# Patient Record
Sex: Female | Born: 1975 | Race: White | Hispanic: No
Health system: Midwestern US, Community
[De-identification: ages and names within clinical notes are randomized; demographics above are authoritative.]

## PROBLEM LIST (undated history)

## (undated) DIAGNOSIS — K029 Dental caries, unspecified: Secondary | ICD-10-CM

## (undated) DIAGNOSIS — B009 Herpesviral infection, unspecified: Secondary | ICD-10-CM

## (undated) DIAGNOSIS — E119 Type 2 diabetes mellitus without complications: Secondary | ICD-10-CM

## (undated) DIAGNOSIS — E781 Pure hyperglyceridemia: Secondary | ICD-10-CM

## (undated) DIAGNOSIS — E7800 Pure hypercholesterolemia, unspecified: Secondary | ICD-10-CM

## (undated) DIAGNOSIS — F39 Unspecified mood [affective] disorder: Secondary | ICD-10-CM

## (undated) HISTORY — PX: APPENDECTOMY: SHX54

## (undated) HISTORY — PX: CHOLECYSTECTOMY: SHX55

## (undated) HISTORY — PX: TONSILLECTOMY: SUR1361

## (undated) HISTORY — PX: SHOULDER SURGERY: SHX246

---

## 2011-04-07 LAB — COMPREHENSIVE METABOLIC PANEL
ALT: 36 U/L (ref 4–40)
AST: 23 U/L (ref 8–36)
Albumin/Globulin Ratio: 1.8 (ref 1.0–2.7)
Albumin: 4.5 g/dL (ref 3.4–4.8)
Alkaline Phosphatase: 81 U/L (ref 25–100)
Anion Gap: 16 mmol/L (ref 8–16)
BUN: 21 mg/dL — ABNORMAL HIGH (ref 6–20)
CO2: 29 mmol/L (ref 20–31)
Calcium: 9.4 mg/dL (ref 8.6–10.4)
Chloride: 99 mmol/L (ref 98–110)
Creatinine: 0.97 mg/dL (ref 0.4–1.0)
GFR African American: >60 mL/min (ref >60)
GFR Non-African American: >60 mL/min (ref >60)
Glucose: 231 mg/dL — ABNORMAL HIGH (ref 74–106)
Potassium: 4.6 mmol/L (ref 3.5–5.1)
Protein, Total: 7 g/dL (ref 6.4–8.3)
Sodium: 140 mmol/L (ref 136–145)
Total Bilirubin: 0.48 mg/dL (ref 0.3–1.2)

## 2011-04-07 LAB — CBC WITH AUTO DIFFERENTIAL
Absolute Eos #: 0.1 10*3/uL (ref 0.0–0.4)
Absolute Lymph #: 2 10*3/uL (ref 1.0–4.8)
Absolute Mono #: 0.5 10*3/uL (ref 0.1–1.2)
Absolute Neut #: 4.6 10*3/uL (ref 1.8–7.7)
Basophils Absolute: 0.1 10*3/uL (ref 0.0–0.2)
Basophils: 1 % (ref 0–2)
Eosinophils %: 2 % (ref 1–4)
Hematocrit: 38 % (ref 36–46)
Hemoglobin: 12.7 g/dL (ref 12.0–16.0)
Lymphocytes: 28 % (ref 24–44)
MCH: 31.4 pg (ref 26–34)
MCHC: 33.4 g/dL (ref 31–37)
MCV: 94 fL (ref 80–100)
MPV: 8.5 fL (ref 6.0–12.0)
Monocytes: 7 % (ref 2–11)
Platelets: 313 10*3/uL (ref 140–450)
RBC: 4.04 m/uL (ref 4.0–5.2)
RDW: 13.7 % (ref 12.5–15.4)
Seg Neutrophils: 62 % (ref 36–66)
WBC: 7.2 10*3/uL (ref 3.5–11.0)

## 2011-04-07 LAB — HEMOGLOBIN A1C
Estimated Avg Glucose: 180 mg/dL
Hemoglobin A1C: 7.9 % — ABNORMAL HIGH (ref 4.0–6.0)

## 2011-04-07 LAB — LIPID PANEL
Chol/HDL Ratio: 4.7 (ref <5.0)
Cholesterol: 179 mg/dL (ref <200)
HDL: 38 mg/dL — ABNORMAL LOW (ref >40)
LDL Cholesterol: 77 mg/dL (ref <100)
Triglycerides: 321 mg/dL — ABNORMAL HIGH (ref <150)

## 2011-04-07 LAB — TSH: TSH: 3.32 mIU/L (ref 0.35–5.50)

## 2011-07-19 LAB — URINE CULTURE CLEAN CATCH

## 2012-06-19 ENCOUNTER — Inpatient Hospital Stay: Admit: 2012-06-19 | Discharge: 2012-06-19 | Disposition: A | Discharge status: Home / Self Care

## 2012-06-19 LAB — CBC WITH AUTO DIFFERENTIAL
Absolute Eos #: 0.12 10*3/uL (ref 0.0–0.4)
Absolute Lymph #: 2.53 10*3/uL (ref 1.0–4.8)
Absolute Mono #: 0.66 10*3/uL (ref 0.2–0.8)
Basophils Absolute: 0.05 10*3/uL (ref 0.0–0.2)
Basophils: 1 % (ref 0–2)
Eosinophils %: 1 % (ref 1–4)
Hematocrit: 40.7 % (ref 36–46)
Hemoglobin: 13.5 g/dL (ref 12.0–16.0)
Lymphocytes: 30 % (ref 24–44)
MCH: 30.8 pg (ref 26–34)
MCHC: 33.2 g/dL (ref 31–37)
MCV: 92.9 fL (ref 80–100)
MPV: 7.7 fL (ref 6.0–12.0)
Monocytes: 8 % — ABNORMAL HIGH (ref 1–7)
Platelets: 397 10*3/uL (ref 130–400)
RBC: 4.38 m/uL (ref 4.0–5.2)
RDW: 13 % (ref 11.5–14.5)
Seg Neutrophils: 60 % (ref 36–66)
Segs Absolute: 5.04 10*3/uL (ref 1.8–7.7)
WBC: 8.4 10*3/uL (ref 3.5–11.0)

## 2012-06-19 LAB — BASIC METABOLIC PANEL
Anion Gap: 14 mmol/L (ref 8–16)
BUN: 22 mg/dL — ABNORMAL HIGH (ref 5–20)
Bun/Cre Ratio: 31 — ABNORMAL HIGH (ref 9–20)
CO2: 23 mmol/L (ref 20–31)
Calcium: 9.5 mg/dL (ref 8.6–10.4)
Chloride: 101 mmol/L (ref 98–110)
Creatinine: 0.71 mg/dL (ref 0.4–1.0)
GFR African American: >60 mL/min (ref >60)
GFR Non-African American: >60 mL/min (ref >60)
Glucose: 288 mg/dL — ABNORMAL HIGH (ref 74–106)
Potassium: 4.1 mmol/L (ref 3.5–5.1)
Sodium: 134 mmol/L — ABNORMAL LOW (ref 136–145)

## 2012-06-19 LAB — URINALYSIS
Bilirubin Urine: NEGATIVE
Leukocyte Esterase, Urine: NEGATIVE
Nitrite, Urine: NEGATIVE
Protein, UA: NEGATIVE
Specific Gravity, UA: 1.034 — ABNORMAL HIGH (ref 1.005–1.030)
Urobilinogen, Urine: NORMAL
pH, UA: 5.5 (ref 5.0–8.0)

## 2012-06-19 LAB — MICROSCOPIC URINALYSIS
RBC, UA: 5 /HPF (ref 0–2)
WBC, UA: 2 /HPF (ref 0–5)

## 2012-06-19 LAB — MYOGLOBIN, SERUM: Myoglobin: 21 ng/mL (ref 0–110)

## 2012-06-19 LAB — TROPONIN: Troponin I: 0.01 ng/mL (ref 0.00–0.04)

## 2012-06-19 LAB — PROTIME-INR
INR: 0.9
Protime: 9.7 s (ref 9.7–11.6)

## 2012-06-19 LAB — APTT: PTT: 25.9 s (ref 23–31)

## 2012-06-19 LAB — CK ISOENZYMES
% CKMB: 2.4 % (ref 0–4)
CK-MB: 1.5 ng/mL (ref 0–5)
CKMB Interpretation: NORMAL
Total CK: 63 U/L (ref 26–140)

## 2012-06-19 LAB — POC GLUCOSE FINGERSTICK: POC Glucose: 284 mg/dL — ABNORMAL HIGH (ref 65–105)

## 2012-06-20 LAB — HCG, Pregnancy Urine (POC): NEGATIVE

## 2012-07-06 LAB — RAPID INFLUENZA A/B ANTIGENS

## 2012-07-06 LAB — CBC WITH AUTO DIFFERENTIAL
Absolute Eos #: 0.19 10*3/uL (ref 0.0–0.4)
Absolute Lymph #: 1.55 10*3/uL (ref 1.0–4.8)
Absolute Mono #: 0.59 10*3/uL (ref 0.2–0.8)
Basophils Absolute: 0.04 10*3/uL (ref 0.0–0.2)
Basophils: 1 % (ref 0–2)
Eosinophils %: 2 % (ref 1–4)
Hematocrit: 38.3 % (ref 36–46)
Hemoglobin: 12.9 g/dL (ref 12.0–16.0)
Lymphocytes: 18 % — ABNORMAL LOW (ref 24–44)
MCH: 31.3 pg (ref 26–34)
MCHC: 33.7 g/dL (ref 31–37)
MCV: 92.8 fL (ref 80–100)
MPV: 7.7 fL (ref 6.0–12.0)
Monocytes: 7 % (ref 1–7)
Platelets: 339 10*3/uL (ref 130–400)
RBC: 4.13 m/uL (ref 4.0–5.2)
RDW: 12.7 % (ref 11.5–14.5)
Seg Neutrophils: 72 % — ABNORMAL HIGH (ref 36–66)
Segs Absolute: 6.14 10*3/uL (ref 1.8–7.7)
WBC: 8.5 10*3/uL (ref 3.5–11.0)

## 2012-07-06 LAB — BASIC METABOLIC PANEL
Anion Gap: 13 mmol/L (ref 8–16)
BUN: 16 mg/dL (ref 5–20)
Bun/Cre Ratio: 27 — ABNORMAL HIGH (ref 9–20)
CO2: 24 mmol/L (ref 20–31)
Calcium: 9.3 mg/dL (ref 8.6–10.4)
Chloride: 103 mmol/L (ref 98–110)
Creatinine: 0.59 mg/dL (ref 0.4–1.0)
GFR African American: >60 mL/min (ref >60)
GFR Non-African American: >60 mL/min (ref >60)
Glucose: 150 mg/dL — ABNORMAL HIGH (ref 74–106)
Potassium: 4.2 mmol/L (ref 3.5–5.1)
Sodium: 136 mmol/L (ref 136–145)

## 2012-07-21 LAB — CBC WITH AUTO DIFFERENTIAL
Absolute Eos #: 0.17 10*3/uL (ref 0.0–0.4)
Absolute Lymph #: 2 10*3/uL (ref 1.0–4.8)
Absolute Mono #: 0.55 10*3/uL (ref 0.2–0.8)
Basophils Absolute: 0.04 10*3/uL (ref 0.0–0.2)
Basophils: 1 % (ref 0–2)
Eosinophils %: 2 % (ref 1–4)
Hematocrit: 35.2 % — ABNORMAL LOW (ref 36–46)
Hemoglobin: 12 g/dL (ref 12.0–16.0)
Lymphocytes: 28 % (ref 24–44)
MCH: 31.6 pg (ref 26–34)
MCHC: 34.2 g/dL (ref 31–37)
MCV: 92.5 fL (ref 80–100)
MPV: 7.7 fL (ref 6.0–12.0)
Monocytes: 8 % — ABNORMAL HIGH (ref 1–7)
Platelets: 352 10*3/uL (ref 130–400)
RBC: 3.8 m/uL — ABNORMAL LOW (ref 4.0–5.2)
RDW: 13 % (ref 11.5–14.5)
Seg Neutrophils: 61 % (ref 36–66)
Segs Absolute: 4.46 10*3/uL (ref 1.8–7.7)
WBC: 7.2 10*3/uL (ref 3.5–11.0)

## 2012-07-21 LAB — PROTIME-INR
INR: 0.9
Protime: 9.3 s — ABNORMAL LOW (ref 9.7–11.6)

## 2012-07-21 LAB — BASIC METABOLIC PANEL
Anion Gap: 17 mmol/L
BUN: 18 mg/dL (ref 6–20)
Bun/Cre Ratio: 28 — ABNORMAL HIGH (ref 9–20)
CO2: 22 mmol/L (ref 20–31)
Calcium: 9.3 mg/dL (ref 8.6–10.0)
Chloride: 96 mmol/L — ABNORMAL LOW (ref 98–107)
Creatinine: 0.64 mg/dL (ref 0.50–0.90)
GFR African American: >60 mL/min (ref >60)
GFR Non-African American: >60 mL/min (ref >60)
Glucose: 238 mg/dL — ABNORMAL HIGH (ref 70–99)
Potassium: 3.7 mmol/L (ref 3.5–5.1)
Sodium: 131 mmol/L — ABNORMAL LOW (ref 136–145)

## 2012-07-21 LAB — HEPATIC FUNCTION PANEL
ALT: 17 U/L (ref 5–33)
AST: 11 U/L (ref <32)
Albumin: 3.5 g/dL (ref 3.5–5.2)
Alkaline Phosphatase: 101 U/L (ref 35–104)
Bilirubin, Direct: 0.09 mg/dL (ref <0.31)
Bilirubin, Indirect: 0.13 mg/dL (ref 0.00–1.00)
Total Bilirubin: 0.22 mg/dL — ABNORMAL LOW (ref 0.3–1.2)
Total Protein: 6.5 g/dL (ref 6.4–8.3)

## 2012-07-21 LAB — APTT: PTT: 25.9 s (ref 23–31)

## 2012-07-24 ENCOUNTER — Inpatient Hospital Stay
Admit: 2012-07-24 | Discharge: 2012-07-24 | Disposition: A | Discharge status: Home / Self Care | Attending: Emergency Medicine

## 2012-07-24 LAB — POC PREGNANCY UR-QUAL: Preg Test, Ur: NEGATIVE

## 2012-07-24 LAB — POC GLUCOSE FINGERSTICK: POC Glucose: 221 mg/dL — ABNORMAL HIGH (ref 65–105)

## 2012-07-24 NOTE — ED Notes (Signed)
Patient resting quiety awaiting physcian for update.    Mayford KnifeBillie J Obdulia Steier, RN  07/24/12 [phone]

## 2012-07-24 NOTE — Discharge Instructions (Signed)
Paresthesia  Paresthesia is an abnormal burning or prickling sensation. This sensation is generally felt in the hands, arms, legs, or feet. However, it may occur in any part of the body. It is usually not painful. The feeling may be described as:   Tingling or numbness.   "Pins and needles."   Skin crawling.   Buzzing.   Limbs "falling asleep."   Itching.  Most people experience temporary (transient) paresthesia at some time in their lives.  CAUSES   Paresthesia may occur when you breathe too quickly (hyperventilation). It can also occur without any apparent cause. Commonly, paresthesia occurs when pressure is placed on a nerve. The feeling quickly goes away once the pressure is removed. For some people, however, paresthesia is a long-lasting (chronic) condition caused by an underlying disorder. The underlying disorder may be:   A traumatic, direct injury to nerves. Examples include a:   Broken (fractured) neck.   Fractured skull.   A disorder affecting the brain and spinal cord (central nervous system). Examples include:   Transverse myelitis.   Encephalitis.   Transient ischemic attack.   Multiple sclerosis.   Stroke.   Tumor or blood vessel problems, such as an arteriovenous malformation pressing against the brain or spinal cord.   A condition that damages the peripheral nerves (peripheral neuropathy). Peripheral nerves are not part of the brain and spinal cord. These conditions include:   Diabetes.   Peripheral vascular disease.   Nerve entrapment syndromes, such as carpal tunnel syndrome.   Shingles.   Hypothyroidism.   Vitamin B12 deficiencies.   Alcoholism.   Heavy metal poisoning (lead, arsenic).   Rheumatoid arthritis.   Systemic lupus erythematosus.  DIAGNOSIS   Your caregiver will attempt to find the underlying cause of your paresthesia. Your caregiver may:   Take your medical history.   Perform a physical exam.   Order various lab tests.   Order imaging tests.  TREATMENT    Treatment for paresthesia depends on the underlying cause.  HOME CARE INSTRUCTIONS   Avoid drinking alcohol.   You may consider massage or acupuncture to help relieve your symptoms.   Keep all follow-up appointments as directed by your caregiver.  SEEK IMMEDIATE MEDICAL CARE IF:    You feel weak.   You have trouble walking or moving.   You have problems with speech or vision.   You feel confused.   You cannot control your bladder or bowel movements.   You feel numbness after an injury.   You faint.   Your burning or prickling feeling gets worse when walking.   You have pain, cramps, or dizziness.   You develop a rash.  MAKE SURE YOU:   Understand these instructions.   Will watch your condition.   Will get help right away if you are not doing well or get worse.  Document Released: 04/03/2002 Document Revised: 07/06/2011 Document Reviewed: 01/02/2011  [facility] Patient Information 2013 [place].

## 2012-07-24 NOTE — ED Provider Notes (Signed)
[facility] EMERGENCY DEPARTMENT  eMERGENCY dEPARTMENT eNCOUnter      Pt Name: Carolyn Crane  MRN: [id number]  Birthdate 1975/08/21  Date of evaluation: 07/24/2012  Provider: Kirstie Mirza, MD    CHIEF COMPLAINT       Chief Complaint   Patient presents with   ??? Numbness         HISTORY OF PRESENT ILLNESS  (Location/Symptom, Timing/Onset, Context/Setting, Quality, Duration, Modifying Factors, Severity.)   Carolyn Crane is a 37 y.o. female who presents to the emergency department for a chief complaint of numbness in a localized area of her left hip and an odd feeling in her left arm and a headache.  She states that this started just before coming into the emergency department and she woke up this way and was not having these symptoms when she went to sleep several hours ago.  There is no history of any trauma and she has no symptoms on the right side of her body.  Nothing seems to make this worse or better.  She has numerous other complaints which have been of an ongoing nature including abdominal pains and headache.  She does not recall any activity which could have caused this to occur.      Nursing Notes were reviewed.    ALLERGIES     Review of patient's allergies indicates no known allergies.    CURRENT MEDICATIONS       Previous Medications    ALPRAZOLAM (XANAX) 1 MG TABLET    Take 1 mg by mouth nightly as needed for Sleep.    CITALOPRAM (CELEXA) 40 MG TABLET    Take 40 mg by mouth daily.    INSULIN LISPRO (HUMALOG) 100 UNIT/ML INJECTION    Inject 15 Units into the skin 3 times daily (before meals).    LISINOPRIL (PRINIVIL;ZESTRIL) 5 MG TABLET    Take 5 mg by mouth daily.    METFORMIN (GLUCOPHAGE) 1000 MG TABLET    Take 1,000 mg by mouth 2 times daily (with meals).       PAST MEDICAL HISTORY         Diagnosis Date   ??? Diabetes mellitus        SURGICAL HISTORY           Procedure Laterality Date   ??? Appendectomy     ??? Cholecystectomy     ??? Tonsillectomy           FAMILY HISTORY     No family history on file.  No  family status information on file.        SOCIAL HISTORY      reports that she has never smoked. She does not have any smokeless tobacco history on file. She reports that she does not drink alcohol or use illicit drugs.    REVIEW OF SYSTEMS    (2-9 systems for level 4, 10 or more for level 5)       Review of Systems   Constitutional: Negative for fever, chills and fatigue.   HENT: Negative for facial swelling and neck pain.    Eyes: Negative for pain.   Respiratory: Negative for cough and shortness of breath.    Cardiovascular: Negative for chest pain.   Gastrointestinal: Positive for abdominal pain. Negative for vomiting, diarrhea and constipation.   Genitourinary: Negative for dysuria and hematuria.   Musculoskeletal: Positive for arthralgias.   Skin: Negative for color change and rash.   Neurological: Positive for numbness and headaches. Negative for syncope  and weakness.   Hematological: Negative for adenopathy.   Psychiatric/Behavioral: Negative for confusion. The patient is not nervous/anxious.         Except as noted above the remainder of the review of systems was reviewed and negative.     PHYSICAL EXAM    (up to 7 for level 4, 8 or more for level 5)   ED Triage Vitals   BP Temp Temp Source Pulse Resp SpO2 Height Weight   07/24/12 0348 07/24/12 0348 07/24/12 0348 07/24/12 0348 07/24/12 0348 07/24/12 0348 07/24/12 0348 07/24/12 0348   127/85 mmHg 98.1 ??F (36.7 ??C) Oral 92 20 99 % 5\' 5"  (1.651 m) 216 lb 8 oz (98.204 kg)       Physical Exam   Constitutional: She is oriented to person, place, and time. She appears well-developed and well-nourished. No distress.   HENT:   Head: Normocephalic and atraumatic.   Eyes: Right eye exhibits no discharge. Left eye exhibits no discharge. No scleral icterus.   Neck: Neck supple.   Cardiovascular: Normal rate and regular rhythm.    Pulmonary/Chest: Effort normal and breath sounds normal. No stridor. No respiratory distress. She has no wheezes. She has no rales.    Abdominal: Soft. She exhibits no distension. There is no tenderness.   Musculoskeletal: Normal range of motion.   Lymphadenopathy:     She has no cervical adenopathy.   Neurological: She is alert and oriented to person, place, and time. She has normal strength. No cranial nerve deficit. GCS eye subscore is 4. GCS verbal subscore is 5. GCS motor subscore is 6.   Skin: Skin is warm and dry. No rash noted. She is not diaphoretic. No erythema.   Psychiatric: She has a normal mood and affect. Her behavior is normal.           DIAGNOSTIC RESULTS       RADIOLOGY:   Non-plain film images such as CT, Ultrasound and MRI are read by the radiologist. Plain radiographic images are visualized and preliminarily interpreted by the emergency physician with the below findings:    Head CT Scan    Interpreted by:  Radiologist    Findings:  No bleeding, No acute ischemic stroke, No mass, No acute changes, See radiology report    Interpretation per the Radiologist below, if available at the time of this note:    CT HEAD WO CONTRAST    Final Result:              ED BEDSIDE ULTRASOUND:   Performed by ED Physician - none    LABS:  Labs Reviewed   POC PREGNANCY UR-QUAL - Normal   POC GLUCOSE FINGERSTICK       All other labs were within normal range or not returned as of this dictation.    EMERGENCY DEPARTMENT COURSE and DIFFERENTIAL DIAGNOSIS/MDM:   Vitals:    Filed Vitals:    07/24/12 0348 07/24/12 0417   BP: 127/85    Pulse: 92 81   Temp: 98.1 ??F (36.7 ??C)    TempSrc: Oral    Resp: 20    Height: 5\' 5"  (1.651 m)    Weight: 216 lb 8 oz (98.204 kg)    SpO2: 99%          CONSULTS:  None    PROCEDURES:  None    FINAL IMPRESSION      1. Paresthesia of left leg          DISPOSITION/PLAN   DISPOSITION  PATIENT REFERRED TO:   Raja Hanif  [address]  [place] [address]  [phone]    Call in 1 day      [facility]  [address] [place] [address]  [phone]    Return if symptoms worsen      DISCHARGE MEDICATIONS:      New Prescriptions    No medications on file         (Please note that portions of this note were completed with a voice recognition program.  Efforts were made to edit the dictations but occasionally words are mis-transcribed.)    Kirstie Mirza, MD  Attending Emergency Physician          Kirstie Mirza, MD  07/24/12 [phone]

## 2012-07-24 NOTE — Other (Signed)
Patient verbalizing feeling weak. Headache.

## 2012-07-24 NOTE — ED Notes (Signed)
Patient to Ct     Mayford KnifeBillie J Domnique Vantine, RN  07/24/12 [phone]

## 2012-07-24 NOTE — ED Notes (Signed)
[facility] [address]    Sully Manzi J Xiamara Hulet, [place]  07/24/12 [phone]

## 2012-10-09 ENCOUNTER — Encounter (HOSPITAL_COMMUNITY): Payer: Self-pay | Admitting: Emergency Medicine

## 2012-10-09 ENCOUNTER — Other Ambulatory Visit: Payer: Self-pay

## 2012-10-09 ENCOUNTER — Emergency Department (HOSPITAL_COMMUNITY)
Admission: EM | Admit: 2012-10-09 | Discharge: 2012-10-09 | Disposition: A | Discharge status: Home / Self Care | Payer: Self-pay | Attending: Emergency Medicine | Admitting: Emergency Medicine

## 2012-10-09 ENCOUNTER — Emergency Department (HOSPITAL_COMMUNITY): Payer: Self-pay

## 2012-10-09 DIAGNOSIS — Z794 Long term (current) use of insulin: Secondary | ICD-10-CM | POA: Insufficient documentation

## 2012-10-09 DIAGNOSIS — K219 Gastro-esophageal reflux disease without esophagitis: Secondary | ICD-10-CM | POA: Insufficient documentation

## 2012-10-09 DIAGNOSIS — R0789 Other chest pain: Secondary | ICD-10-CM | POA: Insufficient documentation

## 2012-10-09 DIAGNOSIS — E119 Type 2 diabetes mellitus without complications: Secondary | ICD-10-CM | POA: Insufficient documentation

## 2012-10-09 DIAGNOSIS — Z79899 Other long term (current) drug therapy: Secondary | ICD-10-CM | POA: Insufficient documentation

## 2012-10-09 DIAGNOSIS — Z3202 Encounter for pregnancy test, result negative: Secondary | ICD-10-CM | POA: Insufficient documentation

## 2012-10-09 DIAGNOSIS — F39 Unspecified mood [affective] disorder: Secondary | ICD-10-CM | POA: Insufficient documentation

## 2012-10-09 DIAGNOSIS — Z8639 Personal history of other endocrine, nutritional and metabolic disease: Secondary | ICD-10-CM | POA: Insufficient documentation

## 2012-10-09 DIAGNOSIS — I10 Essential (primary) hypertension: Secondary | ICD-10-CM | POA: Insufficient documentation

## 2012-10-09 DIAGNOSIS — R110 Nausea: Secondary | ICD-10-CM | POA: Insufficient documentation

## 2012-10-09 DIAGNOSIS — J40 Bronchitis, not specified as acute or chronic: Secondary | ICD-10-CM | POA: Insufficient documentation

## 2012-10-09 DIAGNOSIS — Z862 Personal history of diseases of the blood and blood-forming organs and certain disorders involving the immune mechanism: Secondary | ICD-10-CM | POA: Insufficient documentation

## 2012-10-09 HISTORY — DX: Pure hypercholesterolemia, unspecified: E78.00

## 2012-10-09 HISTORY — DX: Type 2 diabetes mellitus without complications: E11.9

## 2012-10-09 HISTORY — DX: Pure hyperglyceridemia: E78.1

## 2012-10-09 HISTORY — DX: Unspecified mood (affective) disorder: F39

## 2012-10-09 LAB — POCT PREGNANCY, URINE: Preg Test, Ur: NEGATIVE

## 2012-10-09 LAB — CBC WITH DIFFERENTIAL/PLATELET
Basophils Absolute: 0 10*3/uL (ref 0.0–0.1)
Eosinophils Absolute: 0.2 10*3/uL (ref 0.0–0.7)
Eosinophils Relative: 3 % (ref 0–5)
Lymphocytes Relative: 35 % (ref 12–46)
MCV: 93.9 fL (ref 78.0–100.0)
Neutrophils Relative %: 54 % (ref 43–77)
Platelets: 314 10*3/uL (ref 150–400)
RDW: 13.1 % (ref 11.5–15.5)
WBC: 7.6 10*3/uL (ref 4.0–10.5)

## 2012-10-09 LAB — BASIC METABOLIC PANEL
CO2: 27 mEq/L (ref 19–32)
Calcium: 9.4 mg/dL (ref 8.4–10.5)
GFR calc non Af Amer: >90 mL/min (ref >90)
Potassium: 3.6 mEq/L (ref 3.5–5.1)
Sodium: 134 mEq/L — ABNORMAL LOW (ref 135–145)

## 2012-10-09 MED ORDER — IPRATROPIUM BROMIDE 0.02 % IN SOLN
0.5000 mg | Freq: Once | RESPIRATORY_TRACT | Status: AC
  Administered 2012-10-09: 0.5 mg via RESPIRATORY_TRACT
  Filled 2012-10-09: qty 2.5

## 2012-10-09 MED ORDER — ASPIRIN 81 MG PO CHEW
324.0000 mg | CHEWABLE_TABLET | Freq: Once | ORAL | Status: AC
  Administered 2012-10-09: 324 mg via ORAL
  Filled 2012-10-09: qty 4

## 2012-10-09 MED ORDER — GI COCKTAIL ~~LOC~~
30.0000 mL | Freq: Once | ORAL | Status: AC
  Administered 2012-10-09: 30 mL via ORAL
  Filled 2012-10-09: qty 30

## 2012-10-09 MED ORDER — ALBUTEROL SULFATE (5 MG/ML) 0.5% IN NEBU
2.5000 mg | INHALATION_SOLUTION | Freq: Once | RESPIRATORY_TRACT | Status: AC
  Administered 2012-10-09: 2.5 mg via RESPIRATORY_TRACT
  Filled 2012-10-09: qty 0.5

## 2012-10-09 MED ORDER — FAMOTIDINE 20 MG PO TABS
20.0000 mg | ORAL_TABLET | Freq: Once | ORAL | Status: AC
  Administered 2012-10-09: 20 mg via ORAL
  Filled 2012-10-09: qty 1

## 2012-10-09 MED ORDER — ALBUTEROL SULFATE HFA 108 (90 BASE) MCG/ACT IN AERS
2.0000 | INHALATION_SPRAY | RESPIRATORY_TRACT | Status: DC | PRN
  Administered 2012-10-09: 2 via RESPIRATORY_TRACT
  Filled 2012-10-09: qty 6.7

## 2012-10-09 NOTE — ED Notes (Signed)
Patient states had indigestion and nausea that began yesterday; patient states chest pain started 3 hours ago.

## 2012-10-09 NOTE — ED Provider Notes (Signed)
History     CSN: [id number]  Arrival date & time 10/09/12  0223   First MD Initiated Contact with Patient 10/09/12 [phone]      Chief Complaint  Patient presents with  . Chest Pain  . Cough  . Nausea    (Consider location/radiation/quality/duration/timing/severity/associated sxs/prior treatment) Patient is a 37 y.o. female presenting with chest pain and cough. The history is provided by the patient.  Chest Pain Associated symptoms: cough   Cough Associated symptoms: chest pain   She developed a nonproductive cough starting about 6 hours ago. 3 hours ago, she woke up from sleep with indigestion and nausea and a tight feeling in her chest. There's no radiation of this tight feeling. She rates pain at 8/10. Nothing makes it better nothing makes it worse. There is no associated dyspnea except during coughing paroxysms. She has had mild nausea but no vomiting. She denies diaphoresis. She is concerned about heart problems because of a family history of heart disease. However, there is no history of coronary artery disease in any first-degree relatives. She is a nonsmoker but does have history of diabetes, hyperlipidemia, and hypertension. She also has history of problems from GERD.  Past Medical History  Diagnosis Date  . Diabetes mellitus without complication   . High cholesterol   . High triglycerides   . Mood disorder     Past Surgical History  Procedure Laterality Date  . Appendectomy    . Tonsillectomy    . Cholecystectomy      No family history on file.  History  Substance Use Topics  . Smoking status: Never Smoker   . Smokeless tobacco: Not on file  . Alcohol Use: Yes    OB History   Grav Para Term Preterm Abortions TAB SAB Ect Mult Living                  Review of Systems  Respiratory: Positive for cough.   Cardiovascular: Positive for chest pain.  All other systems reviewed and are negative.    Allergies  Review of patient's allergies indicates no known  allergies.  Home Medications   Current Outpatient Rx  Name  Route  Sig  Dispense  Refill  . ARIPiprazole (ABILIFY) 5 MG tablet   Oral   Take 5 mg by mouth daily.         . citalopram (CELEXA) 40 MG tablet   Oral   Take 40 mg by mouth daily.         . insulin aspart (NOVOLOG) 100 UNIT/ML injection   Subcutaneous   Inject into the skin 3 (three) times daily with meals.         . insulin detemir (LEVEMIR) 100 UNIT/ML injection   Subcutaneous   Inject 100 Units into the skin daily.         Marland Kitchen lisinopril (PRINIVIL,ZESTRIL) 5 MG tablet   Oral   Take 5 mg by mouth daily.         . metFORMIN (GLUCOPHAGE) 1000 MG tablet   Oral   Take 1,000 mg by mouth 2 (two) times daily with a meal.           BP 121/78  Pulse 66  Temp(Src) 98.1 F (36.7 C) (Oral)  Resp 20  Ht 5\' 6"  (1.676 m)  Wt 207 lb (93.895 kg)  BMI 33.43 kg/m2  SpO2 97%  LMP 10/02/2012  Physical Exam  Nursing note and vitals reviewed.  37 year old female, resting comfortably and  in no acute distress. Vital signs are normal. Oxygen saturation is 97%, which is normal. Head is normocephalic and atraumatic. PERRLA, EOMI. Oropharynx is clear. Neck is nontender and supple without adenopathy or JVD. Back is nontender and there is no CVA tenderness. Lungs have prolonged exhalation phase with wheezing noted when she is coughing and with forced exhalation. There are no rales or rhonchi. Chest is nontender. Heart has regular rate and rhythm without murmur. Abdomen is soft, flat, nontender without masses or hepatosplenomegaly and peristalsis is normoactive. Extremities have no cyanosis or edema, full range of motion is present. Skin is warm and dry without rash. Neurologic: Mental status is normal, cranial nerves are intact, there are no motor or sensory deficits.  ED Course  Procedures (including critical care time)  Results for orders placed during the hospital encounter of 10/09/12  TROPONIN I      Result  Value Range   Troponin I <0.30  <0.30 ng/mL  CBC WITH DIFFERENTIAL      Result Value Range   WBC 7.6  4.0 - 10.5 K/uL   RBC 3.94  3.87 - 5.11 MIL/uL   Hemoglobin 12.4  12.0 - 15.0 g/dL   HCT 29.5  62.1 - 30.8 %   MCV 93.9  78.0 - 100.0 fL   MCH 31.5  26.0 - 34.0 pg   MCHC 33.5  30.0 - 36.0 g/dL   RDW 65.7  84.6 - 96.2 %   Platelets 314  150 - 400 K/uL   Neutrophils Relative % 54  43 - 77 %   Neutro Abs 4.1  1.7 - 7.7 K/uL   Lymphocytes Relative 35  12 - 46 %   Lymphs Abs 2.7  0.7 - 4.0 K/uL   Monocytes Relative 8  3 - 12 %   Monocytes Absolute 0.6  0.1 - 1.0 K/uL   Eosinophils Relative 3  0 - 5 %   Eosinophils Absolute 0.2  0.0 - 0.7 K/uL   Basophils Relative 0  0 - 1 %   Basophils Absolute 0.0  0.0 - 0.1 K/uL  BASIC METABOLIC PANEL      Result Value Range   Sodium 134 (*) 135 - 145 mEq/L   Potassium 3.6  3.5 - 5.1 mEq/L   Chloride 97  96 - 112 mEq/L   CO2 27  19 - 32 mEq/L   Glucose, Bld 113 (*) 70 - 99 mg/dL   BUN 14  6 - 23 mg/dL   Creatinine, Ser 9.52  0.50 - 1.10 mg/dL   Calcium 9.4  8.4 - 84.1 mg/dL   GFR calc non Af Amer >90  >90 mL/min   GFR calc Af Amer >90  >90 mL/min  POCT PREGNANCY, URINE      Result Value Range   Preg Test, Ur NEGATIVE  NEGATIVE   Dg Chest Portable 1 View  10/09/2012   *RADIOLOGY REPORT*  Clinical Data: Chest pain, shortness of breath.  PORTABLE CHEST - 1 VIEW  Comparison: None.  Findings: No pneumothorax. Lungs clear.  Heart size and pulmonary vascularity normal.  No effusion.  Visualized bones unremarkable.  IMPRESSION: No acute disease   Original Report Authenticated By: D. Hassell III, MD    ECG shows normal sinus rhythm with a rate of 69, no ectopy. Normal axis. Normal P wave. Normal QRS. Normal intervals. Normal ST and T waves. Impression: normal ECG. No prior ECG available for comparison.   1. Bronchitis   2. GERD (gastroesophageal reflux disease)  MDM  Chest tightness which seems more related to bronchitis and  bronchospasm than the coronary artery disease. Of note, patient states that she had a pharmacologic stress test done 2 years ago which was normal. She'll be given therapeutic trial of albuterol with Atrovent. She's given Pepcid and GI cocktail for her indigestion and GERD.  She feels much better after above noted treatment. She is given an albuterol inhaler to take home and is advised to continue to use over-the-counter measures for her indigestion/GERD.      Dione Booze, MD 10/09/12 [phone]

## 2012-12-24 ENCOUNTER — Inpatient Hospital Stay
Admit: 2012-12-24 | Discharge: 2012-12-24 | Disposition: A | Discharge status: Home / Self Care | Attending: Emergency Medicine

## 2012-12-24 LAB — CBC WITH AUTO DIFFERENTIAL
Absolute Eos #: 0.17 10*3/uL (ref 0.0–0.4)
Absolute Lymph #: 2.15 10*3/uL (ref 1.0–4.8)
Absolute Mono #: 0.64 10*3/uL (ref 0.2–0.8)
Basophils Absolute: 0.04 10*3/uL (ref 0.0–0.2)
Basophils: 0 % (ref 0–2)
Eosinophils %: 2 % (ref 1–4)
Hematocrit: 40 % (ref 36–46)
Hemoglobin: 14 g/dL (ref 12.0–16.0)
Lymphocytes: 22 % — ABNORMAL LOW (ref 24–44)
MCH: 32.6 pg (ref 26–34)
MCHC: 35.1 g/dL (ref 31–37)
MCV: 92.8 fL (ref 80–100)
MPV: 7.4 fL (ref 6.0–12.0)
Monocytes: 7 % (ref 1–7)
Platelets: 420 10*3/uL — ABNORMAL HIGH (ref 130–400)
RBC: 4.31 m/uL (ref 4.0–5.2)
RDW: 13.1 % (ref 11.5–14.5)
Seg Neutrophils: 69 % — ABNORMAL HIGH (ref 36–66)
Segs Absolute: 6.62 10*3/uL (ref 1.8–7.7)
WBC: 9.6 10*3/uL (ref 3.5–11.0)

## 2012-12-24 LAB — BASIC METABOLIC PANEL
Anion Gap: 18 mmol/L
BUN: 16 mg/dL (ref 6–20)
Bun/Cre Ratio: 25 — ABNORMAL HIGH (ref 9–20)
CO2: 25 mmol/L (ref 20–31)
Calcium: 9.8 mg/dL (ref 8.6–10.0)
Chloride: 95 mmol/L — ABNORMAL LOW (ref 98–107)
Creatinine: 0.64 mg/dL (ref 0.50–0.90)
GFR African American: >60 mL/min (ref >60)
GFR Non-African American: >60 mL/min (ref >60)
Glucose: 190 mg/dL — ABNORMAL HIGH (ref 70–99)
Potassium: 4.8 mmol/L (ref 3.5–5.1)
Sodium: 133 mmol/L (ref 133–142)

## 2012-12-24 MED ORDER — ALBUTEROL SULFATE HFA 108 (90 BASE) MCG/ACT IN AERS
108 (90 Base) MCG/ACT | RESPIRATORY_TRACT | Status: DC | PRN
Start: 2012-12-24 — End: 2012-12-26

## 2012-12-24 MED ADMIN — 0.9 % sodium chloride bolus: 1000 mL | INTRAVENOUS | @ 15:00:00 | NDC 00338004904

## 2012-12-24 MED ADMIN — albuterol (PROVENTIL) nebulizer solution 5 mg: 5 mg | RESPIRATORY_TRACT | @ 15:00:00 | NDC 50383074120

## 2012-12-24 NOTE — ED Notes (Signed)
ASSESSMENT:     Presents with 5 day hx cough, sweaty, harsh cough, diarrhea, easily exhaustion. Has no appetite and has lost 7# in last 5 days.  Been coughing up thick yellow/green sputum and same nasal discharge. Also now rt ear hurts.  Taking theraflu and last dose was this am, after which felt heart beating hard.  Is diabetic and blood sugar this an was 170. Is out of her Levamir and can't get it until  Tuesday 9/1 from [facility].  Lungs clear with sl wheeze but with harsh bronchial cough, and nasal congestion noted.      Loralee Pacas, RN  12/24/12 1049

## 2012-12-24 NOTE — ED Notes (Signed)
Dr Marlyn Corporal to room and ready for, discharge    Loralee Pacas, RN  12/24/12 1212

## 2012-12-24 NOTE — Progress Notes (Signed)
?? Bronchodilator assessment   [x]     Bronchodilator Assessment    FEV1 % PREDICTED 72  FEV1 actual: 2.3  PEFR  375  PEFR % Predicted 87  RR 16  Bronchodilator assessment at level  2  BRONCHODILATOR ASSESSMENT SCORE  Score 1 2 3 4    Breath Sounds   []   Clear [x]   Mild Wheezing with good aeration []   Moderate I/E wheezing with adequate aeration []   Poor Aeration or diffuse wheezing   Respiratory Rate [x]   Less than 20 []   20-25 []   Greater than 25  []   Greater than 35    Dyspnea [x]   No SOB  []   SOB with minimal activity []   Speaking in partial sentences []   Acute/ At rest   Peakflow (asthma) [x]   80 % or greater predicted/PB  []   Unable []   70% or greater predicted/PB  []   Unable []   51%-70% predicted/PB  []   Unable []   Less than 50% predicted/PB  []   Unable due to distress   FEV1 % Predicted [x]   Greater than 69%  []   Unable  []   Less than 50%-69%  []   Unable  []   Less than 35%-49%  []   Unable  []   Less than 35%  []   Unable due to distress     ?? Bronchodilator assessment   [x]     Bronchodilator Assessment    FEV1 % PREDICTED 98  FEV1 actual: 2.2  PEFR  404  PEFR % Predicted 95  RR 16  Bronchodilator assessment at level  1  BRONCHODILATOR ASSESSMENT SCORE  Score 1 2 3 4    Breath Sounds   [x]   Clear []   Mild Wheezing with good aeration []   Moderate I/E wheezing with adequate aeration []   Poor Aeration or diffuse wheezing   Respiratory Rate [x]   Less than 20 []   20-25 []   Greater than 25  []   Greater than 35    Dyspnea [x]   No SOB  []   SOB with minimal activity []   Speaking in partial sentences []   Acute/ At rest   Peakflow (asthma) [x]   80 % or greater predicted/PB  []   Unable []   70% or greater predicted/PB  []   Unable []   51%-70% predicted/PB  []   Unable []   Less than 50% predicted/PB  []   Unable due to distress   FEV1 % Predicted [x]   Greater than 69%  []   Unable  []   Less than 50%-69%  []   Unable  []   Less than 35%-49%  []   Unable  []   Less than 35%  []   Unable due to distress   ?? Bronchodilator assessment   [x]      Bronchodilator Assessment    FEV1 % PREDICTED na naFEV1 actual: na  PEFR  na  PEFR % Predicted na  RR na  Bronchodilator assessment at level  na  BRONCHODILATOR ASSESSMENT SCORE  Score 1 2 3 4    Breath Sounds   []   Clear []   Mild Wheezing with good aeration []   Moderate I/E wheezing with adequate aeration []   Poor Aeration or diffuse wheezing   Respiratory Rate []   Less than 20 []   20-25 []   Greater than 25  []   Greater than 35    Dyspnea []   No SOB  []   SOB with minimal activity []   Speaking in partial sentences []   Acute/ At rest   Peakflow (asthma) []   80 % or greater predicted/PB  []   Unable []   70% or greater predicted/PB  []$   Unable []$   51%-70% predicted/PB  []$   Unable []$   Less than 50% predicted/PB  []$   Unable due to distress   FEV1 % Predicted []$   Greater than 69%  []$   Unable  []$   Less than 50%-69%  []$   Unable  []$   Less than 35%-49%  []$   Unable  []$   Less than 35%  []$   Unable due to distress     MDI Instruction n

## 2012-12-24 NOTE — ED Provider Notes (Signed)
[facility] EMERGENCY DEPARTMENT  eMERGENCY dEPARTMENT eNCOUnter      Pt Name: Carolyn Crane  MRN: [id number]  Birthdate May 04, 1975  Date of evaluation: 12/24/2012      CHIEF COMPLAINT       Chief Complaint   Patient presents with   ??? Respiratory Distress         HISTORY OF PRESENT ILLNESS    Carolyn Crane is a 37 y.o. female who presents complaining of Upper respiratory infection symptoms such as this congestion productive cough of yellowish, chills and diarrhea.  Patient also has had some shortness of breath and wheezing.  Patient is a nonsmoker and has never had a diagnosis of asthma or COPD.  She denies any chest pain, nausea or abdomen pain.    REVIEW OF SYSTEMS         Review of Systems   Constitutional: Negative for fever and chills.   HENT: Negative for congestion, rhinorrhea, neck pain and neck stiffness.    Eyes: Negative for pain and visual disturbance.   Respiratory: Negative for chest tightness, shortness of breath and wheezing.    Cardiovascular: Negative for chest pain.   Gastrointestinal: Negative for nausea, vomiting, abdominal pain, diarrhea and constipation.   Genitourinary: Negative for flank pain.   Musculoskeletal: Negative for back pain.   Skin: Negative.  Negative for rash.   Neurological: Negative for dizziness, syncope, weakness, numbness and headaches.          PAST MEDICAL HISTORY    has a past medical history of Diabetes mellitus (HCC) and Bipolar 2 disorder (HCC).    SURGICAL HISTORY      has past surgical history that includes Appendectomy; Cholecystectomy; and Tonsillectomy.    CURRENT MEDICATIONS       Previous Medications    ALPRAZOLAM (XANAX) 1 MG TABLET    Take 1 mg by mouth nightly as needed for Sleep.    CITALOPRAM (CELEXA) 40 MG TABLET    Take 40 mg by mouth daily.    INSULIN ASPART (NOVOLOG FLEXPEN) 100 UNIT/ML INJECTION PEN    Inject  into the skin 3 times daily (before meals). Sliding scale    INSULIN DETEMIR (LEVEMIR SC)    Inject 80 Units into the skin daily.    INSULIN LISPRO  (HUMALOG) 100 UNIT/ML INJECTION    Inject 15 Units into the skin 3 times daily (before meals).    LISINOPRIL (PRINIVIL;ZESTRIL) 5 MG TABLET    Take 5 mg by mouth daily.    METFORMIN (GLUCOPHAGE) 1000 MG TABLET    Take 1,000 mg by mouth 2 times daily (with meals).       ALLERGIES     has No Known Allergies.    FAMILY HISTORY     No significant family history       SOCIAL HISTORY      reports that she has never smoked. She does not have any smokeless tobacco history on file. She reports that  drinks alcohol. She reports that she does not use illicit drugs.    PHYSICAL EXAM     INITIAL VITALS: BP 124/81   Pulse 102   Temp(Src) 97.8 ??F (36.6 ??C)   Resp 16   Ht 5\' 5"  (1.651 m)   Wt 208 lb (94.348 kg)   BMI 34.61 kg/m2   SpO2 99%   LMP 12/19/2012           Physical Exam   Constitutional: She is oriented to person, place, and time. She appears  well-developed and well-nourished.   HENT:   Head: Normocephalic and atraumatic.   Right Ear: External ear normal.   Left Ear: External ear normal.   Nose: Nose normal.   Mouth/Throat: Oropharynx is clear and moist.   Eyes: Conjunctivae and EOM are normal. Pupils are equal, round, and reactive to light.   Neck: Normal range of motion. Neck supple.   Cardiovascular: Normal rate and regular rhythm.    Pulmonary/Chest: Effort normal. No respiratory distress. She has wheezes. She has no rales. She exhibits no tenderness.   Patient does have faint expiratory wheezing bilaterally with no rhonchi or rales   Abdominal: Soft. Bowel sounds are normal. There is no tenderness.   Musculoskeletal: Normal range of motion.   Neurological: She is alert and oriented to person, place, and time.   Skin: Skin is warm and dry. No rash noted.   Psychiatric: She has a normal mood and affect. Her behavior is normal.       MEDICAL DECISION MAKING:     Symptomatic treatment for viral illness, IV fluids, respiratory therapy evaluation    DIAGNOSTIC RESULTS     EKG: All EKG's are interpreted by the Emergency  Department Physician who either signs or Co-signs this chart in the absence of a cardiologist.          RADIOLOGY:All plain film, CT, MRI, and formal ultrasound images (except ED bedside ultrasound) are read by the radiologist and the images and interpretations are directly viewed by the emergency physician.   XR CHEST STANDARD TWO VW    Final Result:          No acute findings    LABS: All lab results were reviewed by myself, and all abnormals are listed below.  Labs Reviewed   BASIC METABOLIC PANEL - Abnormal; Notable for the following:     Glucose 190 (*)     Bun/Cre Ratio 25 (*)     Chloride 95 (*)     All other components within normal limits   CBC WITH AUTO DIFFERENTIAL - Abnormal; Notable for the following:     Platelets 420 (*)     Seg Neutrophils 69 (*)     Lymphocytes 22 (*)     All other components within normal limits         EMERGENCY DEPARTMENT COURSE:   Vitals:    Filed Vitals:    12/24/12 1023   BP: 124/81   Pulse: 102   Temp: 97.8 ??F (36.6 ??C)   Resp: 16   Height: 5\' 5"  (1.651 m)   Weight: 208 lb (94.348 kg)   SpO2: 99%     -------------------------    Patient is feeling better after respiratory treatment, I will give her an inhaler as that is been helping her.    Results and plan explained to pt , and pt expressed understanding. Any questions were answered.    CRITICAL CARE:           CONSULTS:  None    PROCEDURES:        FINAL IMPRESSION      1. Viral upper respiratory illness          DISPOSITION/PLAN   DISPOSITION     PATIENT REFERRED TO:  Raja Hanif  [address]  [place] [address]  [phone]    Schedule an appointment as soon as possible for a visit in 2 days        DISCHARGE MEDICATIONS:  New Prescriptions  ALBUTEROL (PROVENTIL HFA) 108 (90 BASE) MCG/ACT INHALER    Inhale 2 puffs into the lungs every 4 hours as needed for Wheezing or Shortness of Breath (Space out to every 6 hours as symptoms improve). Space out to every 6 hours as symptoms improve.       (Please note that portions of  this note were completed with a voice recognition program.  Efforts were made to edit the dictations but occasionally words are mis-transcribed.)    Sela Hua DO  Attending Emergency Physician                        Sela Hua, DO  12/24/12 1153

## 2012-12-24 NOTE — Discharge Instructions (Signed)

## 2012-12-24 NOTE — Progress Notes (Incomplete)
Emergency Department Bronchodilator Assessment    Patient Assessment complete. SHORT OF BREATH .   Filed Vitals:    12/24/12 1023   BP: 124/81   Pulse: 102   Temp: 97.8 ??F (36.6 ??C)   Resp: 16   . Patients home meds are   Prior to Admission medications    Medication Sig Start Date End Date Taking? Authorizing Provider   Insulin Detemir (LEVEMIR SC) Inject 80 Units into the skin daily.   Yes Historical Provider, MD   insulin aspart (NOVOLOG FLEXPEN) 100 UNIT/ML injection pen Inject  into the skin 3 times daily (before meals). Sliding scale   Yes Historical Provider, MD   lisinopril (PRINIVIL;ZESTRIL) 5 MG tablet Take 5 mg by mouth daily.   Yes Historical Provider, MD   citalopram (CELEXA) 40 MG tablet Take 40 mg by mouth daily.   Yes Historical Provider, MD   metFORMIN (GLUCOPHAGE) 1000 MG tablet Take 1,000 mg by mouth 2 times daily (with meals).   Yes Historical Provider, MD   ALPRAZolam Prudy Feeler) 1 MG tablet Take 1 mg by mouth nightly as needed for Sleep.   Yes Historical Provider, MD   insulin lispro (HUMALOG) 100 UNIT/ML injection Inject 15 Units into the skin 3 times daily (before meals).   Yes Historical Provider, MD   .     Patient's predicted PF is:  *** L/min  Last Charted: {PEAK FLOW:20085}    PF before 1st TX PF after 1st TX PF after 2nd TX PF after 3rd TX   *** []  >75% *** []  >75% *** []  >75% *** []  >75%    []  50-75%  []  50-75%  []  50-75%  []  50-75%    []  <50%  []  <50%  []  <50%  []  <50%       HHN treatment(s) given x {ONE, TWO:20144}.      Breath Sounds:   LUNG FIELD Pre-Treatment Post-Treatment   RUL       RML       RLL       LUL       LLL         COMMENTS:  ***  ?? Bronchodilator assessment at level  ***  []     Bronchodilator Assessment    BRONCHODILATOR ASSESSMENT SCORE  Score 1 2 3 4    Breath Sounds   []   Clear []   Mild Wheezing with good aeration []   Moderate I/E wheezing with adequate aeration []   Poor Aeration or diffuse wheezing   Respiratory Rate []   Less than 20 []   20-25 []   Greater than 25   []   Greater than 35    Dyspnea []   No SOB  []   SOB with minimal activity []   Speaking in partial sentences []   Acute/ At rest   Peakflow (asthma) []   80 % or greater predicted/PB  []   Unable []   70% or greater predicted/PB  []   Unable []   51%-70% predicted/PB  []   Unable []   Less than 50% predicted/PB  []   Unable due to distress   FEV1 % Predicted []   Greater than 69%  []   Unable  []   Less than 50%-69%  []   Unable  []   Less than 35%-49%  []   Unable  []   Less than 35%  []   Unable due to distress     Predicted Peak Expiratory Flow Rate  Height (in)  Female       Height (in) Female           Age 63 58 22 62 69 66 44 70 Age            _0 _1 _2 _3 _4 _5 _6 _7 _8 [phone]   _9 [phone]

## 2012-12-24 NOTE — ED Notes (Signed)
Exam per Dr Reita ChardPeppard    Alim Cattell, RN  12/24/12 1051

## 2012-12-26 ENCOUNTER — Inpatient Hospital Stay
Admit: 2012-12-26 | Discharge: 2012-12-26 | Disposition: A | Discharge status: Home / Self Care | Attending: Personal Emergency Response Attendant

## 2012-12-26 MED ORDER — AMOXICILLIN 500 MG PO CAPS
500 MG | ORAL_CAPSULE | Freq: Three times a day (TID) | ORAL | Status: AC
Start: 2012-12-26 — End: 2013-01-05

## 2012-12-26 MED ORDER — ALBUTEROL SULFATE (5 MG/ML) 0.5% IN NEBU
Freq: Four times a day (QID) | RESPIRATORY_TRACT | Status: DC | PRN
Start: 2012-12-26 — End: 2014-03-19

## 2012-12-26 MED ORDER — PREDNISONE 20 MG PO TABS
20 MG | ORAL_TABLET | Freq: Two times a day (BID) | ORAL | Status: AC
Start: 2012-12-26 — End: 2013-01-05

## 2012-12-26 NOTE — ED Provider Notes (Signed)
eMERGENCY dEPARTMENT eNCOUnter        CHIEF COMPLAINT    Chief Complaint   Patient presents with   ??? Cough     here Sat. for URI, states worsening s/s   ??? Back Pain       HPI    Carolyn Crane is a 37 y.o. female who presents with complaints of shortness of breath and cough.  Patient was seen in the ER 2 days back has given prescription for inhaler, but she did not get it filled.  Having cough and congestion.  No chest pain, no nausea, vomiting,  no diarrhea , no fever or chills.  No headache.  Cough was productivie with green sputum.  Cough is more at night    REVIEW OF SYSTEMS    See HPI for further details. Review of systems otherwise negative.     PAST MEDICAL HISTORY    Past Medical History   Diagnosis Date   ??? Diabetes mellitus (HCC)    ??? Bipolar 2 disorder [facility])        SURGICAL HISTORY    Past Surgical History   Procedure Laterality Date   ??? Appendectomy     ??? Cholecystectomy     ??? Tonsillectomy         CURRENT MEDICATIONS    Current Outpatient Rx   Name  Route  Sig  Dispense  Refill   ??? Cyanocobalamin (VITAMIN B12 PO)    Oral    Take 1 tablet by mouth daily.             ??? lamoTRIgine (LAMICTAL) 25 MG tablet    Oral    Take 25 mg by mouth daily.             ??? predniSONE (DELTASONE) 20 MG tablet    Oral    Take 1 tablet by mouth 2 times daily for 10 days.    10 tablet    0     ??? albuterol (PROVENTIL) (5 MG/ML) 0.5% nebulizer solution    Nebulization    Take 0.5 mLs by nebulization every 6 hours as needed for Wheezing.    30 vial    0     ??? amoxicillin (AMOXIL) 500 MG capsule    Oral    Take 1 capsule by mouth 3 times daily for 10 days.    30 capsule    0     ??? Insulin Detemir (LEVEMIR SC)    Subcutaneous    Inject 80 Units into the skin daily.             ??? insulin aspart (NOVOLOG FLEXPEN) 100 UNIT/ML injection pen    Subcutaneous    Inject  into the skin 3 times daily (before meals). Sliding scale             ??? lisinopril (PRINIVIL;ZESTRIL) 5 MG tablet    Oral    Take 5 mg by mouth daily.             ???  citalopram (CELEXA) 40 MG tablet    Oral    Take 40 mg by mouth daily.             ??? metFORMIN (GLUCOPHAGE) 1000 MG tablet    Oral    Take 1,000 mg by mouth 2 times daily (with meals).             ??? ALPRAZolam (XANAX) 1 MG tablet    Oral  Take 1 mg by mouth as needed for Sleep or Anxiety.             ??? insulin lispro (HUMALOG) 100 UNIT/ML injection    Subcutaneous    Inject 15 Units into the skin 3 times daily (before meals).                 ALLERGIES    No Known Allergies    FAMILY HISTORY    No family history on file.    SOCIAL HISTORY    History     Social History   ??? Marital Status: Divorced     Spouse Name: N/A     Number of Children: N/A   ??? Years of Education: N/A     Social History Main Topics   ??? Smoking status: Never Smoker    ??? Smokeless tobacco: None   ??? Alcohol Use: Yes      Comment: socially   ??? Drug Use: No   ??? Sexual Activity:     Partners: Male     Other Topics Concern   ??? None     Social History Narrative       PHYSICAL EXAM    VITAL SIGNS: BP 119/73   Pulse 107   Temp(Src) 98.5 ??F (36.9 ??C) (Oral)   Resp 14   Ht 5\' 5"  (1.651 m)   Wt 212 lb 4.9 oz (96.3 kg)   BMI 35.33 kg/m2   SpO2 97%   LMP 12/19/2012   Constitutional:  Well developed, well nourished, no acute distress   HENT:  Atraumatic, external ears normal, nose normal, oropharynx moist. Neck- supple   Respiratory:  Air entry.  Diminished on both bases.  Few coarse wheezes bilaterally and scattered wheezes   Cardiovascular:  S1, S2.  Rate, rhythm regular, no murmur   GI:  Soft, nondistended, normal bowel sounds, nontender, no organomegaly, no mass, no rebound, no guarding   Musculoskeletal:  No edema, no tenderness, no deformities. Back- no tenderness   Integument:  Well hydrated, no rash   Neurologic:  Alert & oriented x 3, no focal deficits noted     EKG    Not clinically indicated.      RADIOLOGY/PROCEDURES    Not clinically indicated.      ED COURSE & MEDICAL DECISION MAKING    Pertinent Labs & Imaging studies reviewed. (See chart for  details)  37 year old coming with cough and wheezing    FINAL IMPRESSION    1.  Asthmatic bronchitis  2.        Christie Nottingham, MD  12/26/12 [phone]

## 2012-12-26 NOTE — Discharge Instructions (Signed)
Asthma, Adult  Asthma is a disease of the lungs and can make it hard to breathe. Asthma cannot be cured, but medicine can help control it. Asthma may be started (triggered) by:   Pollen.   Dust.   Animal skin flakes (dander).   Molds.   Foods.   Respiratory infections (colds, flu).   Smoke.   Exercise.   Stress.   Other things that cause allergic reactions or allergies (allergens).  HOME CARE    Talk to your doctor about how to manage your attacks at home. This may include:   Using a tool called a peak flow meter.   Having medicine ready to stop the attack.   Take all medicine as told by your doctor.   Wash bed sheets and blankets every week in hot water and put them in the dryer.   Drink enough fluids to keep your pee (urine) clear or pale yellow.   Always be ready to get emergency help. Write down the phone number for your doctor. Keep it where you can easily find it.   Talk about exercise routines with your doctor.   If animal dander is causing your asthma, you may need to find a new home for your pet(s).  GET HELP RIGHT AWAY IF:    You have muscle aches.   You cough more.   You have chest pain.   You have thick spit (sputum) that changes to yellow, green, gray, or bloody.   Medicine does not stop your wheezing.   You have problems breathing.   You have a fever.   Your medicine causes:   A rash.   Itching.   Puffiness (swelling).   Breathing problems.  MAKE SURE YOU:    Understand these instructions.   Will watch your condition.   Will get help right away if you are not doing well or get worse.  Document Released: 09/30/2007 Document Revised: 07/06/2011 Document Reviewed: 02/22/2008  [facility] Patient Information 2014 [place], [place].

## 2013-01-30 NOTE — Progress Notes (Signed)
[place] pre-employment physical and urine drug screen

## 2013-02-11 NOTE — ED Provider Notes (Signed)
[facility]  eMERGENCY dEPARTMENT eNCOUnter      Pt Name: Carolyn Crane  MRN: [id number]  [place] [birth year]  Date of evaluation: 02/11/2013  Provider: Laurel Dimmer, MD    CHIEF COMPLAINT       Chief Complaint   Patient presents with   ??? Headache     Sts hx of uncontrolled diabetes, intmrnt visual problems since past 2 weeks   ??? Visual Problems         HISTORY OF PRESENT ILLNESS  (Location/Symptom, Timing/Onset, Context/Setting, Quality, Duration, Modifying Factors, Severity.)   Carolyn Crane is a 37 y.o. female who presents to the emergency department for a headache mostly at the front of her head.  She has been having this for about 2 weeks with a waxing and waning.  No trauma fever or stiff neck.  She feels like her eyelids are droopy and she rates the pain as a 5.  She has had some twitching in her left hand for months intermittently and that is not changed.    Nursing Notes were reviewed.    ALLERGIES     Review of patient's allergies indicates no known allergies.    CURRENT MEDICATIONS       Previous Medications    ALBUTEROL (PROVENTIL) (5 MG/ML) 0.5% NEBULIZER SOLUTION    Take 0.5 mLs by nebulization every 6 hours as needed for Wheezing.    ALPRAZOLAM (XANAX) 1 MG TABLET    Take 1 mg by mouth as needed for Sleep or Anxiety.    CITALOPRAM (CELEXA) 40 MG TABLET    Take 40 mg by mouth daily.    CYANOCOBALAMIN (VITAMIN B12 PO)    Take 1 tablet by mouth daily.    INSULIN ASPART (NOVOLOG FLEXPEN) 100 UNIT/ML INJECTION PEN    Inject  into the skin 3 times daily (before meals). Sliding scale    INSULIN DETEMIR (LEVEMIR SC)    Inject 80 Units into the skin daily.    INSULIN GLARGINE (LANTUS SOLOSTAR) 100 UNIT/ML INJECTION PEN    Inject  into the skin nightly.    INSULIN LISPRO (HUMALOG) 100 UNIT/ML INJECTION    Inject 15 Units into the skin 3 times daily (before meals).    LAMOTRIGINE (LAMICTAL) 25 MG TABLET    Take 25 mg by mouth daily.    LISINOPRIL (PRINIVIL;ZESTRIL) 5 MG TABLET    Take 5 mg by  mouth daily.    METFORMIN (GLUCOPHAGE) 1000 MG TABLET    Take 1,000 mg by mouth 2 times daily (with meals).       PAST MEDICAL HISTORY         Diagnosis Date   ??? Diabetes mellitus ([place])        SURGICAL HISTORY           Procedure Laterality Date   ??? Appendectomy     ??? Cholecystectomy     ??? Tonsillectomy           FAMILY HISTORY     No family history on file.  No family status information on file.        SOCIAL HISTORY      reports that she has never smoked. She does not have any smokeless tobacco history on file. She reports that  drinks alcohol. She reports that she does not use illicit drugs.    REVIEW OF SYSTEMS    (2-9 systems for level 4, 10 or more for level 5)     Review of Systems  Constitutional: Negative for fever, chills and fatigue.   HENT: Negative for congestion, facial swelling and ear discharge.    Eyes: Negative for discharge and redness.   Respiratory: Negative for cough and shortness of breath.    Cardiovascular: Negative for chest pain.   Gastrointestinal: Negative for vomiting, abdominal pain, diarrhea and constipation.   Genitourinary: Negative for dysuria and hematuria.   Musculoskeletal: Negative for arthralgias.   Skin: Negative for color change and rash.   Neurological: Positive for headaches. Negative for dizziness, seizures, syncope, weakness and numbness.   Hematological: Negative for adenopathy.   Psychiatric/Behavioral: Negative for confusion. The patient is not nervous/anxious.          Except as noted above the remainder of the review of systems was reviewed and negative.     PHYSICAL EXAM    (up to 7 for level 4, 8 or more for level 5)     Filed Vitals:    02/11/13 2256   BP: 128/79   Pulse: 74   Temp: 97.7 ??F (36.5 ??C)   TempSrc: Oral   Resp: 16   Height: 5' 5"$  (1.651 m)   Weight: 216 lb (97.977 kg)   SpO2: 97%       Physical Exam   Constitutional: She is oriented to person, place, and time. She appears well-developed and well-nourished. No distress.   HENT:   Head: Normocephalic  and atraumatic.   Eyes: Right eye exhibits no discharge. Left eye exhibits no discharge. No scleral icterus.   Neck: Neck supple.   Cardiovascular: Normal rate and regular rhythm.    Pulmonary/Chest: Effort normal and breath sounds normal. No stridor. No respiratory distress. She has no wheezes. She has no rales.   Abdominal: Soft. She exhibits no distension. There is no tenderness.   Musculoskeletal: Normal range of motion.   Lymphadenopathy:     She has no cervical adenopathy.   Neurological: She is alert and oriented to person, place, and time. No cranial nerve deficit.   Skin: Skin is warm and dry. No rash noted. She is not diaphoretic. No erythema.   Psychiatric: She has a normal mood and affect. Her behavior is normal.           DIAGNOSTIC RESULTS     EKG: All EKG's are interpreted by the Emergency Department Physician who either signs or Co-signs this chart in the absence of a cardiologist.    Not indicated    RADIOLOGY:   Non-plain film images such as CT, Ultrasound and MRI are read by the radiologist. Plain radiographic images are visualized and preliminarily interpreted by the emergency physician with the below findings:    CAT scan head on my interpretation shows no acute findings.    Interpretation per the Radiologist below, if available at the time of this note:    CT HEAD WO CONTRAST    Final Result:            LABS:  Labs Reviewed   CBC WITH AUTO DIFFERENTIAL - Abnormal; Notable for the following:     RBC 3.78 (*)     Hematocrit 35.2 (*)     Seg Neutrophils 67 (*)     Monocytes 8 (*)     All other components within normal limits   BASIC METABOLIC PANEL - Abnormal; Notable for the following:     Glucose 166 (*)     Bun/Cre Ratio 29 (*)     Sodium 130 (*)     Chloride 95 (*)  All other components within normal limits       All other labs were within normal range or not returned as of this dictation.    EMERGENCY DEPARTMENT COURSE and DIFFERENTIAL DIAGNOSIS/MDM:   Vitals:    Filed Vitals:    02/11/13  2256   BP: 128/79   Pulse: 74   Temp: 97.7 ??F (36.5 ??C)   TempSrc: Oral   Resp: 16   Height: 5' 5"$  (1.651 m)   Weight: 216 lb (97.977 kg)   SpO2: 97%       Orders Placed This Encounter   Medications   ??? butalbital-acetaminophen-caffeine (FIORICET) 50-325-40 MG per tablet     Sig: Take 1 tablet by mouth every 6 hours as needed for Headaches.     Dispense:  20 tablet     Refill:  0       Medical Decision Making: Blood work is nonspecific.  She will be prescribed Fioricet and will follow-up with her doctor.  Treatment diagnosis and follow-up were discussed with the patient.  I have no suspicion of meningitis.     CONSULTS:  None    PROCEDURES:  None    FINAL IMPRESSION      1. Cephalgia          DISPOSITION/PLAN   DISPOSITION Decision to Discharge    PATIENT REFERRED TO:   Raja Hanif  [place] [address]  [phone]      As needed    [facility] Emergency Department  [address]  [place] [address]  [phone]    If symptoms worsen      DISCHARGE MEDICATIONS:     New Prescriptions    BUTALBITAL-ACETAMINOPHEN-CAFFEINE (FIORICET) 50-325-40 MG PER TABLET    Take 1 tablet by mouth every 6 hours as needed for Headaches.         (Please note that portions of this note were completed with a voice recognition program.  Efforts were made to edit the dictations but occasionally words are mis-transcribed.)    Laurel Dimmer, MD  Attending Emergency Physician            Laurel Dimmer, MD  02/12/13 [phone]

## 2013-02-12 ENCOUNTER — Inpatient Hospital Stay
Admit: 2013-02-12 | Discharge: 2013-02-12 | Disposition: A | Discharge status: Home / Self Care | Attending: Emergency Medicine

## 2013-02-12 LAB — BASIC METABOLIC PANEL
Anion Gap: 13 mmol/L
BUN: 18 mg/dL (ref 6–20)
Bun/Cre Ratio: 29 — ABNORMAL HIGH (ref 9–20)
CO2: 26 mmol/L (ref 20–31)
Calcium: 8.7 mg/dL (ref 8.6–10.4)
Chloride: 95 mmol/L — ABNORMAL LOW (ref 98–107)
Creatinine: 0.62 mg/dL (ref 0.50–0.90)
GFR African American: >60 mL/min (ref >60)
GFR Non-African American: >60 mL/min (ref >60)
Glucose: 166 mg/dL — ABNORMAL HIGH (ref 70–99)
Potassium: 4.1 mmol/L (ref 3.5–5.1)
Sodium: 130 mmol/L — ABNORMAL LOW (ref 133–142)

## 2013-02-12 LAB — CBC WITH AUTO DIFFERENTIAL
Absolute Eos #: 0.08 10*3/uL (ref 0.0–0.4)
Absolute Lymph #: 1.94 10*3/uL (ref 1.0–4.8)
Absolute Mono #: 0.62 10*3/uL (ref 0.2–0.8)
Basophils Absolute: 0.02 10*3/uL (ref 0.0–0.2)
Basophils: 0 % (ref 0–2)
Eosinophils %: 1 % (ref 1–4)
Hematocrit: 35.2 % — ABNORMAL LOW (ref 36–46)
Hemoglobin: 12.5 g/dL (ref 12.0–16.0)
Lymphocytes: 24 % (ref 24–44)
MCH: 33 pg (ref 26–34)
MCHC: 35.4 g/dL (ref 31–37)
MCV: 93 fL (ref 80–100)
MPV: 7.5 fL (ref 6.0–12.0)
Monocytes: 8 % — ABNORMAL HIGH (ref 1–7)
Platelets: 316 10*3/uL (ref 130–400)
RBC: 3.78 m/uL — ABNORMAL LOW (ref 4.0–5.2)
RDW: 13 % (ref 11.5–14.5)
Seg Neutrophils: 67 % — ABNORMAL HIGH (ref 36–66)
Segs Absolute: 5.58 10*3/uL (ref 1.8–7.7)
WBC: 8.2 10*3/uL (ref 3.5–11.0)

## 2013-02-12 MED ORDER — BUTALBITAL-APAP-CAFFEINE 50-325-40 MG PO TABS
50-325-40 MG | ORAL_TABLET | Freq: Four times a day (QID) | ORAL | Status: DC | PRN
Start: 2013-02-12 — End: 2014-03-19

## 2013-02-12 NOTE — Discharge Instructions (Signed)
Headache: After Your Visit  Your Care Instructions     Headaches have many possible causes. Most headaches aren't a sign of a more serious problem, and they will get better on their own. Home treatment may help you feel better faster.  The doctor has checked you carefully, but problems can develop later. If you notice any problems or new symptoms, get medical treatment right away.  Follow-up care is a key part of your treatment and safety. Be sure to make and go to all appointments, and call your doctor if you are having problems. It's also a good idea to know your test results and keep a list of the medicines you take.  How can you care for yourself at home?   Do not drive if you have taken a prescription pain medicine.   Rest in a quiet, dark room until your headache is gone. Close your eyes and try to relax or go to sleep. Don't watch TV or read.   Put a cold, moist cloth or cold pack on the painful area for 10 to 20 minutes at a time. Put a thin cloth between the cold pack and your skin.   Use a warm, moist towel or a heating pad set on low to relax tight shoulder and neck muscles.   Have someone gently massage your neck and shoulders.   Take pain medicines exactly as directed.   If the doctor gave you a prescription medicine for pain, take it as prescribed.   If you are not taking a prescription pain medicine, ask your doctor if you can take an over-the-counter medicine.   Be careful not to take pain medicine more often than the instructions allow, because you may get worse or more frequent headaches when the medicine wears off.   Do not ignore new symptoms that occur with a headache, such as a fever, weakness or numbness, vision changes, or confusion. These may be signs of a more serious problem.  To prevent headaches   Keep a headache diary so you can figure out what triggers your headaches. Avoiding triggers may help you prevent headaches. Record when each headache began, how long it lasted, and  what the pain was like (throbbing, aching, stabbing, or dull). Write down any other symptoms you had with the headache, such as nausea, flashing lights or dark spots, or sensitivity to bright light or loud noise. Note if the headache occurred near your period. List anything that might have triggered the headache, such as certain foods (chocolate, cheese, wine) or odors, smoke, bright light, stress, or lack of sleep.   Find healthy ways to deal with stress. Headaches are most common during or right after stressful times. Take time to relax before and after you do something that has caused a headache in the past.   Try to keep your muscles relaxed by keeping good posture. Check your jaw, face, neck, and shoulder muscles for tension, and try relaxing them. When sitting at a desk, change positions often, and stretch for 30 seconds each hour.   Get plenty of sleep and exercise.   Eat regularly and well. Long periods without food can trigger a headache.   Treat yourself to a massage. Some people find that regular massages are very helpful in relieving tension.   Limit caffeine by not drinking too much coffee, tea, or soda. But don't quit caffeine suddenly, because that can also give you headaches.   Reduce eyestrain from computers by blinking frequently and looking away   from the computer screen every so often. Make sure you have proper eyewear and that your monitor is set up properly, about an arm's length away.   Seek help if you have depression or anxiety. Your headaches may be linked to these conditions. Treatment can both prevent headaches and help with symptoms of anxiety or depression.  When should you call for help?  Call 911 anytime you think you may need emergency care. For example, call if:   You have signs of a stroke. These may include:   Sudden numbness, paralysis, or weakness in your face, arm, or leg, especially on only one side of your body.   Sudden vision changes.   Sudden trouble  speaking.   Sudden confusion or trouble understanding simple statements.   Sudden problems with walking or balance.   A sudden, severe headache that is different from past headaches.  Call your doctor now or seek immediate medical care if:   You have a new or worse headache.   Your headache gets much worse.   Where can you learn more?   Go to [email or web address] and sign in to your MyChart account. Enter M271 in the Search Health Information box to learn more about "Headache: After Your Visit."    If you do not have an account, please click on the "Sign Up Now" link.      2006-2014 Healthwise, Incorporated. Care instructions adapted under license by [facility]. This care instruction is for use with your licensed healthcare professional. If you have questions about a medical condition or this instruction, always ask your healthcare professional. Healthwise, Incorporated disclaims any warranty or liability for your use of this information.  Content Version: 10.1.311062; Current as of: July 06, 2012

## 2013-03-21 ENCOUNTER — Inpatient Hospital Stay
Admit: 2013-03-21 | Discharge: 2013-03-21 | Disposition: A | Discharge status: Home / Self Care | Attending: Emergency Medicine

## 2013-03-21 MED ORDER — PREDNISONE 20 MG PO TABS
20 MG | ORAL_TABLET | Freq: Two times a day (BID) | ORAL | Status: AC
Start: 2013-03-21 — End: 2013-03-24

## 2013-03-21 MED ORDER — OXYCODONE-ACETAMINOPHEN 5-325 MG PO TABS
5-325 MG | ORAL_TABLET | ORAL | Status: DC | PRN
Start: 2013-03-21 — End: 2014-03-19

## 2013-03-21 MED ORDER — FEXOFENADINE HCL 180 MG PO TABS
180 MG | ORAL_TABLET | Freq: Every day | ORAL | Status: DC
Start: 2013-03-21 — End: 2014-03-19

## 2013-03-21 MED ORDER — AZITHROMYCIN 500 MG PO TABS
500 MG | ORAL_TABLET | Freq: Every day | ORAL | Status: DC
Start: 2013-03-21 — End: 2014-03-19

## 2013-03-21 MED ADMIN — penicillin G benzathine and procaine (BICILLIN C-R) 1200000 UNIT/2ML suspension 1.2 Million Units: INTRAMUSCULAR | @ 14:00:00 | NDC 60793060002

## 2013-03-21 MED ADMIN — dexamethasone (DECADRON) injection 10 mg: INTRAMUSCULAR | @ 14:00:00 | NDC 00641036721

## 2013-03-21 MED FILL — DEXAMETHASONE SODIUM PHOSPHATE 10 MG/ML IJ SOLN: 10 MG/ML | INTRAMUSCULAR | Qty: 1

## 2013-03-21 MED FILL — BICILLIN C-R 1200000 UNIT/2ML IM SUSP: 1200000 UNIT/2ML | INTRAMUSCULAR | Qty: 2

## 2013-03-21 NOTE — ED Provider Notes (Signed)
[facility]  eMERGENCY dEPARTMENT eNCOUnter      Pt Name: Carolyn Crane  MRN: [id number]  [place] 1976-01-04  Date of evaluation: 03/21/2013  Provider: Cherre Blanc, MD    CHIEF COMPLAINT     No chief complaint on file.        HISTORY OF PRESENT ILLNESS  (Location/Symptom, Timing/Onset, Context/Setting, Quality, Duration, Modifying Factors, Severity.)   Carolyn Crane is a 37 y.o. female who presents to the emergency department due to sore throat and fevers chills.  She has a sensation of throat swelling as well.  Her roommate is employed in a pediatrician office and is now ill with similar symptoms.  No cough.  Patient does have sinus pressure and drainage.  No complaint of headache neck pain skin rash or lesion.       Nursing Notes were reviewed.    ALLERGIES     Review of patient's allergies indicates no known allergies.    CURRENT MEDICATIONS       Previous Medications    ALBUTEROL (PROVENTIL) (5 MG/ML) 0.5% NEBULIZER SOLUTION    Take 0.5 mLs by nebulization every 6 hours as needed for Wheezing.    ALPRAZOLAM (XANAX) 1 MG TABLET    Take 1 mg by mouth as needed for Sleep or Anxiety.    BUTALBITAL-ACETAMINOPHEN-CAFFEINE (FIORICET) 50-325-40 MG PER TABLET    Take 1 tablet by mouth every 6 hours as needed for Headaches.    CITALOPRAM (CELEXA) 40 MG TABLET    Take 40 mg by mouth daily.    CYANOCOBALAMIN (VITAMIN B12 PO)    Take 1 tablet by mouth daily.    INSULIN ASPART (NOVOLOG FLEXPEN) 100 UNIT/ML INJECTION PEN    Inject  into the skin 3 times daily (before meals). Sliding scale    INSULIN DETEMIR (LEVEMIR SC)    Inject 80 Units into the skin daily.    INSULIN GLARGINE (LANTUS SOLOSTAR) 100 UNIT/ML INJECTION PEN    Inject  into the skin nightly.    INSULIN LISPRO (HUMALOG) 100 UNIT/ML INJECTION    Inject 15 Units into the skin 3 times daily (before meals).    LAMOTRIGINE (LAMICTAL) 25 MG TABLET    Take 25 mg by mouth daily.    LISINOPRIL (PRINIVIL;ZESTRIL) 5 MG TABLET    Take 5 mg by  mouth daily.    METFORMIN (GLUCOPHAGE) 1000 MG TABLET    Take 1,000 mg by mouth 2 times daily (with meals).       PAST MEDICAL HISTORY         Diagnosis Date   ??? Diabetes mellitus ([place])        SURGICAL HISTORY           Procedure Laterality Date   ??? Appendectomy     ??? Cholecystectomy     ??? Tonsillectomy           FAMILY HISTORY     No family history on file.  No family status information on file.        SOCIAL HISTORY      reports that she has never smoked. She does not have any smokeless tobacco history on file. She reports that she drinks alcohol. She reports that she does not use illicit drugs.  Patient is not currently employed.  She denies pregnancy or breast-feeding.  She resides in a private dwelling with family.  She has had recent ill contacts  REVIEW OF SYSTEMS    (2-9 systems for level  4, 10 or more for level 5)     Review of Systems   Constitutional: Positive for fever, chills and activity change.   HENT: Positive for ear pain, sinus pressure, sore throat, trouble swallowing and voice change.    Respiratory: Negative.    Cardiovascular: Negative.    Gastrointestinal: Negative.    Genitourinary: Negative.    Musculoskeletal: Negative.    Neurological: Negative for dizziness and headaches.   Hematological: Positive for adenopathy.   Psychiatric/Behavioral: Negative.        Except as noted above the remainder of the review of systems was reviewed and negative.     PHYSICAL EXAM    (up to 7 for level 4, 8 or more for level 5)   There were no vitals filed for this visit.    Physical exam reflects a well-nourished well-hydrated female with low-grade temperature vital signs are otherwise stable to include pulse ox of 98% on room air.  She's not hypoxic.  She is alert conversive and appropriate in behavior.  Oropharyngeal exam shows patient to be status post traditional tonsillectomy she does however have uvulitis.  She has anterior cervical lymphadenopathy.  She has some vocal change although is able to breathe  speak swallow without difficulty and no stridor is noted.  Sinuses mild is tender.   nasal injection is noted.  Heart regular rate and rhythm normal S1-S2 no murmurs rubs gallops.  Lungs clear to auscultation without wheezes rales rhonchi.  Abdomen is soft.  Integument without rash or lesion.  Extremities without gross abnormality.  No neurovascular deficits noted      DIAGNOSTIC RESULTS     EMERGENCY DEPARTMENT COURSE and DIFFERENTIAL DIAGNOSIS/MDM:   Vitals:  There were no vitals filed for this visit.    Patient is evaluated.  Treatment options discussed with her.  She has known exposure to strep.  She is treated with intramuscular Bicillin and intramuscular Decadron.  She'll be discharged home on azithromycin prednisone and Percocet as well as Allegra for her sinus pressure.  She will utilize sliding scale coverage with regard to her anticipation and elevated blood sugars from her baseline given her illness and steroid use .she is advised follow-up with her family physician.  She is advised to return to the ER with worsening symptoms noted  CONSULTS:  None    PROCEDURES:  None    FINAL IMPRESSION      1. Uvulitis          DISPOSITION/PLAN   DISPOSITION Decision to Discharge    PATIENT REFERRED TO:   Raja Hanif  [employer] [place] [address]  [phone]    Call in 3 days  If symptoms worsen    [facility]  [address]  [place] [address]  [phone]    As needed, If symptoms worsen      DISCHARGE MEDICATIONS:     New Prescriptions    AZITHROMYCIN (ZITHROMAX) 500 MG TABLET    Take 1 tablet by mouth daily.    FEXOFENADINE (ALLEGRA) 180 MG TABLET    Take 1 tablet by mouth daily.    OXYCODONE-ACETAMINOPHEN (PERCOCET) 5-325 MG PER TABLET    Take 1-2 tablets by mouth every 4 hours as needed (as needed for fever and discomfort).    PREDNISONE (DELTASONE) 20 MG TABLET    Take 1 tablet by mouth 2 times daily for 3 days.           (Please note that portions of  this note were completed with a  voice recognition program.  Efforts were made to edit the dictations but occasionally words are mis-transcribed.)    Cherre Blanc, MD  Attending Emergency Physician          Cherre Blanc, MD  03/21/13 [phone]

## 2013-03-21 NOTE — Discharge Instructions (Signed)
SLIDING SCALE INSULIN COVERAGE AT LEAST THREE TIMES A DAY WHILE ON PREDNISONE    Sore Throat: After Your Visit  Your Care Instructions     Infection by bacteria or a virus causes most sore throats. Cigarette smoke, dry air, air pollution, allergies, and yelling can also cause a sore throat. Sore throats can be painful and annoying. Fortunately, most sore throats go away on their own. If you have a bacterial infection, your doctor may prescribe antibiotics.  Follow-up care is a key part of your treatment and safety. Be sure to make and go to all appointments, and call your doctor if you are having problems. It's also a good idea to know your test results and keep a list of the medicines you take.  How can you care for yourself at home?   If your doctor prescribed antibiotics, take them as directed. Do not stop taking them just because you feel better. You need to take the full course of antibiotics.   Gargle with warm salt water once an hour to help reduce swelling and relieve discomfort. Use 1 teaspoon of salt mixed in 1 cup of warm water.   Take an over-the-counter pain medicine, such as acetaminophen (Tylenol), ibuprofen (Advil, Motrin), or naproxen (Aleve). Read and follow all instructions on the label.   Be careful when taking over-the-counter cold or flu medicines and Tylenol at the same time. Many of these medicines have acetaminophen, which is Tylenol. Read the labels to make sure that you are not taking more than the recommended dose. Too much acetaminophen (Tylenol) can be harmful.   Drink plenty of fluids. Fluids may help soothe an irritated throat. Hot fluids, such as tea or soup, may help decrease throat pain.   Use over-the-counter throat lozenges to soothe pain. Regular cough drops or hard candy may also help. These should not be given to young children because of the risk of choking.   Do not smoke or allow others to smoke around you. If you need help quitting, talk to your doctor about  stop-smoking programs and medicines. These can increase your chances of quitting for good.   Use a vaporizer or humidifier to add moisture to your bedroom. Follow the directions for cleaning the machine.  When should you call for help?  Call your doctor now or seek immediate medical care if:   You have new or worse trouble swallowing.   Your sore throat gets much worse on one side.  Watch closely for changes in your health, and be sure to contact your doctor if you do not get better as expected.   Where can you learn more?   Go to [email or web address] and sign in to your MyChart account. Enter [phone] in the [place] box to learn more about "Sore Throat: After Your Visit."    If you do not have an account, please click on the "Sign Up Now" link.      2006-2014 Healthwise, Incorporated. Care instructions adapted under license by [facility]. This care instruction is for use with your licensed healthcare professional. If you have questions about a medical condition or this instruction, always ask your healthcare professional. [place] any warranty or liability for your use of this information.  Content Version: 10.3.368381; Current as of: Sep 02, 2011

## 2013-04-03 ENCOUNTER — Inpatient Hospital Stay: Admit: 2013-04-03 | Discharge: 2013-04-03

## 2013-04-13 ENCOUNTER — Inpatient Hospital Stay
Admit: 2013-04-13 | Discharge: 2013-04-14 | Discharge status: Against Medical Advice | Attending: Personal Emergency Response Attendant

## 2013-04-13 NOTE — ED Notes (Signed)
Pt decided she wanted to leave AMA, pt visualized walking off unit. Dr. Philippa Chester notified.    Elmore Guise, RN  04/13/13 2020

## 2013-04-13 NOTE — ED Provider Notes (Signed)
[facility]  eMERGENCY dEPARTMENT eNCOUnter  Non-emergency resident     Pt Name: Carolyn Crane  MRN: [id number]  Birthdate 05-08-1975  Date of evaluation: 04/13/2013    CHIEF COMPLAINT      Chief Complaint   Patient presents with   ??? Blood Sugar Problem       Nursing Notes, Past Medical Hx, Past Surgical Hx, Social Hx, Allergies, and Family Hx were all reviewed and agreed with or any disagreements were addressed in the HPI.    HISTORY OF PRESENT ILLNESS      This is a  37 y.o. female that presents to the ER with complains of fluctuating blood glucose. Patient is a diabetic on lantus 80 units insulin and 2 gm metformin daily. Patient states her blood glucose was 66 today and 52 yesterday. She has not taken her insulin in 36 hours. States having occasional chest pain with nausea, no radiation of pain. Patient states when her blood glucose drops she does have palpitations, anxiety. She quit drinking about 4 weeks ago and stopped taking her abilify   In ER blood glucose is 161     REVIEW OF SYSTEMS       Review of Systems   Constitutional: Positive for chills, diaphoresis and fatigue. Negative for fever and activity change.   HENT: Negative for trouble swallowing.    Eyes: Negative for visual disturbance.   Respiratory: Negative for cough and choking.    Cardiovascular: Negative for chest pain.   Gastrointestinal: Negative for abdominal pain.   Genitourinary: Negative for dysuria.   Musculoskeletal: Negative for arthralgias.   Skin: Negative for rash.   Neurological: Positive for dizziness and headaches. Negative for speech difficulty.   Psychiatric/Behavioral: The patient is nervous/anxious.          MEDICAL HISTORY          Diagnosis Date   ??? Diabetes mellitus ([facility])    ??? Hypertension        SURGICAL HISTORY          Procedure Laterality Date   ??? Appendectomy     ??? Cholecystectomy     ??? Tonsillectomy             SOCIAL HISTORY      History     Social History   ??? Marital Status: Divorced     Spouse  Name: N/A     Number of Children: N/A   ??? Years of Education: N/A     Occupational History   ??? Not on file.     Social History Main Topics   ??? Smoking status: Never Smoker    ??? Smokeless tobacco: Not on file   ??? Alcohol Use: No   ??? Drug Use: No   ??? Sexual Activity:     Partners: Male     Other Topics Concern   ??? Not on file     Social History Narrative           MEDICATIONS      Previous Medications    ALBUTEROL (PROVENTIL) (5 MG/ML) 0.5% NEBULIZER SOLUTION    Take 0.5 mLs by nebulization every 6 hours as needed for Wheezing.    ALPRAZOLAM (XANAX) 1 MG TABLET    Take 1 mg by mouth as needed for Sleep or Anxiety.    AZITHROMYCIN (ZITHROMAX) 500 MG TABLET    Take 1 tablet by mouth daily.    BUSPIRONE (BUSPAR) 10 MG TABLET    Take  10 mg by mouth 3 times daily.    BUTALBITAL-ACETAMINOPHEN-CAFFEINE (FIORICET) 50-325-40 MG PER TABLET    Take 1 tablet by mouth every 6 hours as needed for Headaches.    CITALOPRAM (CELEXA) 40 MG TABLET    Take 40 mg by mouth daily.    CYANOCOBALAMIN (VITAMIN B12 PO)    Take 1 tablet by mouth daily.    FEXOFENADINE (ALLEGRA) 180 MG TABLET    Take 1 tablet by mouth daily.    INSULIN ASPART (NOVOLOG FLEXPEN) 100 UNIT/ML INJECTION PEN    Inject  into the skin 3 times daily (before meals). Sliding scale    INSULIN DETEMIR (LEVEMIR SC)    Inject 80 Units into the skin daily.    INSULIN GLARGINE (LANTUS SOLOSTAR) 100 UNIT/ML INJECTION PEN    Inject  into the skin nightly.    INSULIN LISPRO (HUMALOG) 100 UNIT/ML INJECTION    Inject 15 Units into the skin 3 times daily (before meals).    LAMOTRIGINE (LAMICTAL) 25 MG TABLET    Take 25 mg by mouth daily.    LISINOPRIL (PRINIVIL;ZESTRIL) 5 MG TABLET    Take 5 mg by mouth daily.    METFORMIN (GLUCOPHAGE) 1000 MG TABLET    Take 1,000 mg by mouth 2 times daily (with meals).    OXYCODONE-ACETAMINOPHEN (PERCOCET) 5-325 MG PER TABLET    Take 1-2 tablets by mouth every 4 hours as needed (as needed for fever and discomfort).       ALLERGIES    has No Known  Allergies.      PHYSICAL EXAM     Vitals:    Filed Vitals:    04/13/13 1917   BP: 122/76   Pulse: 72   Temp: 97.3 ??F (36.3 ??C)   TempSrc: Oral   Resp: 18   Height: 5\' 5"  (1.651 m)   Weight: 206 lb (93.441 kg)   SpO2: 98%       Physical Exam   Constitutional: She is oriented to person, place, and time. She appears well-developed and well-nourished.   HENT:   Head: Normocephalic.   Eyes: Conjunctivae and EOM are normal.   Neck: Normal range of motion.   Cardiovascular: Normal rate.    Pulmonary/Chest: Effort normal.   Abdominal: Soft.   Musculoskeletal: Normal range of motion.   Neurological: She is alert and oriented to person, place, and time.   Skin: Skin is warm.   Psychiatric: Her mood appears anxious.   Nursing note and vitals reviewed.            DIAGNOSTIC RESULTS     EKG: All EKG's are interpreted by the Emergency Department Physician who either signs or Co-signs this chart in the absence of a cardiologist.        RADIOLOGY:   I directly visualized the following  images and reviewed the radiologist interpretations:        LABS:  Labs Reviewed   CBC WITH AUTO DIFFERENTIAL - Abnormal; Notable for the following:     RBC 3.85 (*)     All other components within normal limits   POC GLUCOSE FINGERSTICK - Abnormal; Notable for the following:     POC Glucose 163 (*)     All other components within normal limits   BASIC METABOLIC PANEL           EMERGENCY DEPARTMENT COURSE:     Vitals:    Filed Vitals:    04/13/13 1917   BP: 122/76   Pulse:  72   Temp: 97.3 ??F (36.3 ??C)   TempSrc: Oral   Resp: 18   Height: 5\' 5"  (1.651 m)   Weight: 206 lb (93.441 kg)   SpO2: 98%       2000: Awaiting labs . Reviewed EKG with attending  2020: Patient eloped without treatment being completed.     PROCEDURES:      CONSULTS:    None    DIFFERENTIAL DIAGNOSIS/MDM:     Renal Dysfunction  Fluctuating BG     FINAL IMPRESSION    No diagnosis found.      DISPOSITION/PLAN   DISPOSITION   Patient eloped before treatment the treatment was complete      Gillermina Hu, MD    [facility]  Non-emergency medicine resident      Gillermina Hu, MD  Resident  04/13/13 [phone]

## 2013-04-13 NOTE — ED Provider Notes (Signed)
Attending Supervisory Note/Shared Visit   I have personally performed a face to face diagnostic evaluation on this patient. I have reviewed the mid-level???s findings and agree.  History and Exam by me shows patient came with complaints of high blood sugar.  Pressure was found be 160.  She came by squad and mild.  The labs were getting done.  She stormed out of the room and left without giving any reason.  The signed by her because she left the hospital premises immediately      (Please note that portions of this note were completed with a voice recognition program.  Efforts were made to edit the dictations but occasionally words are mis-transcribed.)    Christie Nottingham, MD  Attending Emergency Physician      Christie Nottingham, MD  04/13/13 2020

## 2013-04-13 NOTE — ED Notes (Signed)
Pt brought in by EMS, pt states her Blood Sugar has been low, states it was 52 yesterday  And 66 this am. Pt states she is a diabetic and stopped taking insulin yesterday morning. Pt states she feels her BS are out of control. Pt also states she had an episode of chest pain earlier today, and states she has a headache that has been ongoing for the past 4 weeks.Pt is A&Ox4, Respirations even and non labored, Skin warm and dry, Pulses palpable and equal bilaterally on upper and lower extremities. Cap refill < 3 sec on upper and lower extremities.       Kathrene Bongo, RN  04/13/13 [phone]

## 2013-04-14 LAB — BASIC METABOLIC PANEL
Anion Gap: 14 mmol/L
BUN: 10 mg/dL (ref 6–20)
Bun/Cre Ratio: 14 (ref 9–20)
CO2: 28 mmol/L (ref 20–31)
Calcium: 9.1 mg/dL (ref 8.6–10.4)
Chloride: 96 mmol/L — ABNORMAL LOW (ref 98–107)
Creatinine: 0.7 mg/dL (ref 0.50–0.90)
GFR African American: >60 mL/min (ref >60)
GFR Non-African American: >60 mL/min (ref >60)
Glucose: 144 mg/dL — ABNORMAL HIGH (ref 70–99)
Potassium: 4.2 mmol/L (ref 3.7–5.3)
Sodium: 134 mmol/L — ABNORMAL LOW (ref 135–144)

## 2013-04-14 LAB — CBC WITH AUTO DIFFERENTIAL
Absolute Eos #: 0.09 10*3/uL (ref 0.0–0.4)
Absolute Lymph #: 2 10*3/uL (ref 1.0–4.8)
Absolute Mono #: 0.48 10*3/uL (ref 0.2–0.8)
Basophils Absolute: 0.03 10*3/uL (ref 0.0–0.2)
Basophils: 0 % (ref 0–2)
Eosinophils %: 1 % (ref 1–4)
Hematocrit: 36.2 % (ref 36–46)
Hemoglobin: 13 g/dL (ref 12.0–16.0)
Lymphocytes: 28 % (ref 24–44)
MCH: 33.7 pg (ref 26–34)
MCHC: 35.9 g/dL (ref 31–37)
MCV: 93.8 fL (ref 80–100)
MPV: 7.3 fL (ref 6.0–12.0)
Monocytes: 7 % (ref 1–7)
Platelets: 319 10*3/uL (ref 130–400)
RBC: 3.85 m/uL — ABNORMAL LOW (ref 4.0–5.2)
RDW: 12.7 % (ref 11.5–14.5)
Seg Neutrophils: 64 % (ref 36–66)
Segs Absolute: 4.62 10*3/uL (ref 1.8–7.7)
WBC: 7.2 10*3/uL (ref 3.5–11.0)

## 2013-04-14 LAB — EKG 12-LEAD
Atrial Rate: 67 {beats}/min
P Axis: 43 degrees
P-R Interval: 142 ms
Q-T Interval: 416 ms
QRS Duration: 94 ms
QTc Calculation (Bazett): 439 ms
R Axis: 62 degrees
T Axis: 26 degrees
Ventricular Rate: 67 {beats}/min

## 2013-04-14 LAB — POC GLUCOSE FINGERSTICK: POC Glucose: 163 mg/dL — ABNORMAL HIGH (ref 65–105)

## 2014-01-05 LAB — CADMIUM BLOOD: Cadmium: <1 ug/L (ref 0.0–5.0)

## 2014-01-06 LAB — CADMIUM URINE
Cadmium, Urine /volume: <1 ug/L (ref 0.0–2.6)
Cadmium, Urine ratio Creatinine: <0.8 ug/g CRT (ref 0.0–3.0)
Creatinine Urine /volume: 128 mg/dL

## 2014-01-06 LAB — BETA-2-MICROGLOBULIN RANDOM URINE
Beta 2 Microglobulin ug/g CRT: 4 ug/g CRT (ref 0–300)
Beta-2 Microglob.,U: 5 ug/L (ref 0–300)
Creatinine Urine /volume: 132 mg/dL
pH, Urine: 5

## 2014-03-19 ENCOUNTER — Inpatient Hospital Stay
Admit: 2014-03-19 | Discharge: 2014-03-19 | Disposition: A | Discharge status: Home / Self Care | Attending: Emergency Medicine

## 2014-03-19 ENCOUNTER — Encounter: Admit: 2014-03-19 | Discharge: 2014-03-28 | Payer: PRIVATE HEALTH INSURANCE | Primary: Nurse Practitioner

## 2014-03-19 DIAGNOSIS — R519 Headache, unspecified: Secondary | ICD-10-CM

## 2014-03-19 LAB — POCT URINE PREGNANCY: Preg Test, Ur: NEGATIVE

## 2014-03-19 LAB — POCT GLUCOSE: Glucose: 180 mg/dL

## 2014-03-19 LAB — POC GLUCOSE FINGERSTICK: POC Glucose: 180 mg/dL — ABNORMAL HIGH (ref 65–105)

## 2014-03-19 MED ORDER — KETOROLAC TROMETHAMINE 30 MG/ML IJ SOLN
30 MG/ML | Freq: Once | INTRAMUSCULAR | Status: AC
Start: 2014-03-19 — End: 2014-03-19
  Administered 2014-03-19: 20:00:00 30 mg via INTRAVENOUS

## 2014-03-19 MED ORDER — BUTALBITAL-APAP-CAFFEINE 50-325-40 MG PO TABS
50-325-40 MG | ORAL_TABLET | ORAL | Status: DC | PRN
Start: 2014-03-19 — End: 2015-12-19

## 2014-03-19 MED ORDER — ACETAMINOPHEN-CODEINE 300-30 MG PO TABS
300-30 MG | ORAL_TABLET | Freq: Three times a day (TID) | ORAL | Status: DC | PRN
Start: 2014-03-19 — End: 2015-12-19

## 2014-03-19 MED ORDER — SODIUM CHLORIDE 0.9 % IV BOLUS
0.9 % | Freq: Once | INTRAVENOUS | Status: AC
Start: 2014-03-19 — End: 2014-03-19
  Administered 2014-03-19: 20:00:00 1000 mL via INTRAVENOUS

## 2014-03-19 MED ORDER — FLUTICASONE PROPIONATE 50 MCG/ACT NA SUSP
50 MCG/ACT | Freq: Every day | NASAL | Status: DC
Start: 2014-03-19 — End: 2014-03-19
  Administered 2014-03-19: 21:00:00 1 via NASAL

## 2014-03-19 MED ORDER — IBUPROFEN 800 MG PO TABS
800 MG | ORAL_TABLET | Freq: Three times a day (TID) | ORAL | Status: DC | PRN
Start: 2014-03-19 — End: 2016-09-26

## 2014-03-19 MED FILL — KETOROLAC TROMETHAMINE 30 MG/ML IJ SOLN: 30 MG/ML | INTRAMUSCULAR | Qty: 1

## 2014-03-19 MED FILL — FLUTICASONE PROPIONATE 50 MCG/ACT NA SUSP: 50 MCG/ACT | NASAL | Qty: 16

## 2014-03-19 NOTE — ED Provider Notes (Signed)
[facility]       Faculty Attestation    I performed a history and physical examination of the patient and discussed management with the resident. I reviewed the resident???s note and agree with the documented findings and plan of care. Any areas of disagreement are noted on the chart. I was personally present for the key portions of any procedures. I have documented in the chart those procedures where I was not present during the key portions. I have reviewed the emergency nurses triage note. I agree with the chief complaint, past medical history, past surgical history, allergies, medications, social and family history as documented unless otherwise noted below. Documentation of the HPI, Physical Exam and Medical Decision Making performed by medical students or scribes is based on my personal performance of the HPI, PE and MDM. For Physician Assistant/ Nurse Practitioner cases/documentation I have had a face to face evaluation with this patient and have completed at least one if not all key elements of the E/M (history, physical exam, and MDM). Additional findings are as noted.      Primary Care Physician:  Dierdre HighmanJennifer L Delaney, CNP    CHIEF COMPLAINT       Chief Complaint   Patient presents with   ??? Headache   ??? Other     spots in field of vision       RECENT VITALS:   Temp: 97.8 ??F (36.6 ??C),  Pulse: 76, Resp: 18, BP: 122/71 mmHg    LABS:  Labs Reviewed   POC GLUCOSE FINGERSTICK - Abnormal; Notable for the following:     POC Glucose 180 (*)     All other components within normal limits   POCT URINE PREGNANCY - Normal   URINALYSIS   POCT GLUCOSE         PERTINENT ATTENDING PHYSICIAN COMMENTS:    Patient presents with frontal headache and states it is severe pressure, not of sudden onset, she does not typically get headaches this bad, she also has spots in the field of vision, she saw her optometrist 5 months ago which had a normal exam,  CT negative, neurological exam negative, very difficult  examination for optic disc as the patient is unable to keep her eyes still but I did not see any obvious pathology, unable to adequately visualize the optic disc due to patient moving her eyes around    Critical Care          Sela Huarent Elisha Cooksey,  MD, [facility]  Attending Emergency  Physician              Sela Huarent Lindalee Huizinga, DO  03/19/14 1557

## 2014-03-19 NOTE — ED Notes (Signed)
Pt. Presents with c/o headache, dizziness, and spots in vision. Started 5 days ago. A+Ox4. Resp even and nonlaboored. No SOB. Skin p/w/d. GMS intact. Rates pain 6/10.    Payton MccallumGeorge A Bellatrix Devonshire, RN  03/19/14 1425

## 2014-03-19 NOTE — Discharge Instructions (Signed)
Headache: Care Instructions  Your Care Instructions     Headaches have many possible causes. Most headaches aren't a sign of a more serious problem, and they will get better on their own. Home treatment may help you feel better faster.  The doctor has checked you carefully, but problems can develop later. If you notice any problems or new symptoms, get medical treatment right away.  Follow-up care is a key part of your treatment and safety. Be sure to make and go to all appointments, and call your doctor if you are having problems. It's also a good idea to know your test results and keep a list of the medicines you take.  How can you care for yourself at home?   Do not drive if you have taken a prescription pain medicine.   Rest in a quiet, dark room until your headache is gone. Close your eyes and try to relax or go to sleep. Don't watch TV or read.   Put a cold, moist cloth or cold pack on the painful area for 10 to 20 minutes at a time. Put a thin cloth between the cold pack and your skin.   Use a warm, moist towel or a heating pad set on low to relax tight shoulder and neck muscles.   Have someone gently massage your neck and shoulders.   Take pain medicines exactly as directed.   If the doctor gave you a prescription medicine for pain, take it as prescribed.   If you are not taking a prescription pain medicine, ask your doctor if you can take an over-the-counter medicine.   Be careful not to take pain medicine more often than the instructions allow, because you may get worse or more frequent headaches when the medicine wears off.   Do not ignore new symptoms that occur with a headache, such as a fever, weakness or numbness, vision changes, or confusion. These may be signs of a more serious problem.  To prevent headaches   Keep a headache diary so you can figure out what triggers your headaches. Avoiding triggers may help you prevent headaches. Record when each headache began, how long it lasted,  and what the pain was like (throbbing, aching, stabbing, or dull). Write down any other symptoms you had with the headache, such as nausea, flashing lights or dark spots, or sensitivity to bright light or loud noise. Note if the headache occurred near your period. List anything that might have triggered the headache, such as certain foods (chocolate, cheese, wine) or odors, smoke, bright light, stress, or lack of sleep.   Find healthy ways to deal with stress. Headaches are most common during or right after stressful times. Take time to relax before and after you do something that has caused a headache in the past.   Try to keep your muscles relaxed by keeping good posture. Check your jaw, face, neck, and shoulder muscles for tension, and try relaxing them. When sitting at a desk, change positions often, and stretch for 30 seconds each hour.   Get plenty of sleep and exercise.   Eat regularly and well. Long periods without food can trigger a headache.   Treat yourself to a massage. Some people find that regular massages are very helpful in relieving tension.   Limit caffeine by not drinking too much coffee, tea, or soda. But don't quit caffeine suddenly, because that can also give you headaches.   Reduce eyestrain from computers by blinking frequently and looking away from   the computer screen every so often. Make sure you have proper eyewear and that your monitor is set up properly, about an arm's length away.   Seek help if you have depression or anxiety. Your headaches may be linked to these conditions. Treatment can both prevent headaches and help with symptoms of anxiety or depression.  When should you call for help?  Call 911 anytime you think you may need emergency care. For example, call if:   You have signs of a stroke. These may include:   Sudden numbness, paralysis, or weakness in your face, arm, or leg, especially on only one side of your body.   Sudden vision changes.   Sudden trouble  speaking.   Sudden confusion or trouble understanding simple statements.   Sudden problems with walking or balance.   A sudden, severe headache that is different from past headaches.  Call your doctor now or seek immediate medical care if:   You have a new or worse headache.   Your headache gets much worse.   Where can you learn more?   Go to [email or web address] and sign in to your MyChart account. Enter M271 in the Search Health Information box to learn more about "Headache: Care Instructions."    If you do not have an account, please click on the "Sign Up Now" link.      2006-2015 [employer]. Care instructions adapted under license by [facility]. This care instruction is for use with your licensed healthcare professional. If you have questions about a medical condition or this instruction, always ask your healthcare professional. Healthwise, Incorporated disclaims any warranty or liability for your use of this information.  Content Version: 10.6.465758; Current as of: June 16, 2013

## 2014-03-19 NOTE — ED Provider Notes (Signed)
[facility]  eMERGENCY dEPARTMENT eNCOUnter      Pt Name: Carolyn MooreKara M Thorington  MRN: [id number]  Birthdate 11/26/1975  Date of evaluation: 03/19/2014  Provider: Carolyn StareKristin M Faven Watterson, NP    CHIEF COMPLAINT       Chief Complaint   Patient presents with   ??? Headache   ??? Other     spots in field of vision         HISTORY OF PRESENT ILLNESS  (Location/Symptom, Timing/Onset, Context/Setting, Quality, Duration, Modifying Factors, Severity.)   Carolyn Crane is a 38 y.o. female who presents to the emergency department via private auto for a frontal headache, vision changes. Onset was five days ago. States she is seeing spots. Reports the left eye is worse than the right. Denies sinus congestion/drainage, fever, chills, neck pain/stiffness, N/T to her extremities, urinary symptoms. States she is driving today and there is no one who can pick her up. Rates her pain 6/10 at this time and describes it as a pressure. She is requesting norco.       Nursing Notes were reviewed.    ALLERGIES     Review of patient's allergies indicates no known allergies.    CURRENT MEDICATIONS       Discharge Medication List as of 03/19/2014  4:57 PM      CONTINUE these medications which have NOT CHANGED    Details   canagliflozin (INVOKANA) 300 MG TABS tablet Take 300 mg by mouth every morning (before breakfast)      ARIPiprazole (ABILIFY) 2 MG tablet Take 2 mg by mouth daily      atorvastatin (LIPITOR) 80 MG tablet Take 80 mg by mouth daily      fenofibrate 160 MG tablet Take 160 mg by mouth daily      insulin glargine (LANTUS SOLOSTAR) 100 UNIT/ML injection pen Inject into the skin 2 times daily 20units am and 60units pm      insulin aspart (NOVOLOG FLEXPEN) 100 UNIT/ML injection pen Inject  into the skin 3 times daily (before meals). Sliding scale      lisinopril (PRINIVIL;ZESTRIL) 5 MG tablet Take 5 mg by mouth daily.      citalopram (CELEXA) 40 MG tablet Take 40 mg by mouth daily.      metFORMIN (GLUCOPHAGE) 1000 MG tablet Take 1,000 mg  by mouth 2 times daily (with meals).      ALPRAZolam (XANAX) 1 MG tablet Take 1 mg by mouth as needed for Sleep or Anxiety.             PAST MEDICAL HISTORY         Diagnosis Date   ??? Diabetes mellitus (HCC)    ??? Hypertension    ??? Depression    ??? Bipolar 1 disorder (HCC)    ??? Hyperlipidemia        SURGICAL HISTORY           Procedure Laterality Date   ??? Appendectomy     ??? Cholecystectomy     ??? Tonsillectomy           FAMILY HISTORY     History reviewed. No pertinent family history.  No family status information on file.        SOCIAL HISTORY      reports that she has never smoked. She does not have any smokeless tobacco history on file. She reports that she drinks alcohol. She reports that she does not use illicit drugs.    REVIEW OF  SYSTEMS    (2-9 systems for level 4, 10 or more for level 5)   Review of Systems   Constitutional: Negative for fever, chills, diaphoresis, activity change, appetite change and fatigue.   HENT: Negative for congestion, ear discharge, ear pain, postnasal drip, rhinorrhea, sinus pressure and sore throat.    Eyes: Negative for photophobia, pain and visual disturbance.   Respiratory: Negative for cough and shortness of breath.    Cardiovascular: Negative for chest pain and palpitations.   Gastrointestinal: Negative for nausea, vomiting, abdominal pain and diarrhea.   Genitourinary: Negative for dysuria.   Musculoskeletal: Negative for myalgias, arthralgias, neck pain and neck stiffness.   Skin: Negative for color change and rash.   Neurological: Positive for headaches. Negative for dizziness, syncope, facial asymmetry, speech difficulty, weakness, light-headedness and numbness.     Except as noted above the remainder of the review of systems was reviewed and negative.     PHYSICAL EXAM    (up to 7 for level 4, 8 or more for level 5)   ED Triage Vitals   BP Temp Temp Source Pulse Resp SpO2 Height Weight   03/19/14 1350 03/19/14 1350 03/19/14 1350 03/19/14 1350 03/19/14 1350 03/19/14 1350  03/19/14 1350 03/19/14 1350   122/71 mmHg 97.8 ??F (36.6 ??C) Oral 76 18 99 % 5\' 6"  (1.676 m) 210 lb 15.7 oz (95.7 kg)     Physical Exam   Constitutional: She is oriented to person, place, and time. She appears well-developed and well-nourished. No distress.   HENT:   Head: Normocephalic and atraumatic.   Mouth/Throat: Oropharynx is clear and moist.   Eyes: Conjunctivae and EOM are normal. Pupils are equal, round, and reactive to light. No scleral icterus.   Neck: Normal range of motion. Neck supple.   Cardiovascular: Normal rate, regular rhythm and normal heart sounds.    Pulmonary/Chest: Effort normal and breath sounds normal. No respiratory distress. She has no wheezes. She has no rales.   Lymphadenopathy:     She has no cervical adenopathy.   Neurological: She is alert and oriented to person, place, and time. She has normal strength. No cranial nerve deficit. Coordination and gait normal. GCS eye subscore is 4. GCS verbal subscore is 5. GCS motor subscore is 6.   Skin: Skin is warm and dry. No rash noted. She is not diaphoretic.   Vitals reviewed.      DIAGNOSTIC RESULTS     RADIOLOGY:   Non-plain film images such as CT, Ultrasound and MRI are read by the radiologist. Plain radiographic images are visualized and preliminarily interpreted by the emergency physician with the below findings:    Interpretation per the Radiologist below, if available at the time of this note:    Ct Head Wo Contrast    03/19/2014   CT head without intravenous contrast, 03/19/2014  History::: Headaches  Technique: CT images of the head were obtained without intravenous contrast.  Findings: No evidence for shift of midline structures, mass effect or compression of the ventricles noted. No acute intra-or extra-axial hemorrhage is seen. No abnormal intracranial fluid collections are identified.  The basal cisterns are patent. Posterior fossa structures demonstrate no discrete mass.  Calvarium is intact.  Impression: No evidence for acute  intracranial hemorrhage, mass effect or acute territorial infarct.  Final report electronically signed by Tonette Bihari, M.D. on 03/19/2014 2:55 PM    LABS:  Labs Reviewed   POC GLUCOSE FINGERSTICK - Abnormal; Notable for the following:  POC Glucose 180 (*)     All other components within normal limits   POCT URINE PREGNANCY - Normal   POCT GLUCOSE - Normal   URINALYSIS       All other labs were within normal range or not returned as of this dictation.    EMERGENCY DEPARTMENT COURSE and DIFFERENTIAL DIAGNOSIS/MDM:   Vitals:    Filed Vitals:    03/19/14 1350   BP: 122/71   Pulse: 76   Temp: 97.8 ??F (36.6 ??C)   TempSrc: Oral   Resp: 18   Height: 5\' 6"  (1.676 m)   Weight: 210 lb 15.7 oz (95.7 kg)   SpO2: 99%       MEDICATIONS GIVEN IN THE ED:  Medications   fluticasone (FLONASE) 50 MCG/ACT nasal spray 1 spray (1 spray Each Nare Given 03/19/14 1533)   0.9 % sodium chloride bolus (0 mLs Intravenous Stopped 03/19/14 1558)   ketorolac (TORADOL) injection 30 mg (30 mg Intravenous Given 03/19/14 1501)       CLINICAL DECISION MAKING:  The patient presented alert with a nontoxic appearance and was seen in conjunction with Dr. Charmian MuffPeppard. CT scan was negative for acute findings. She was medicated with some improvement. The patient is driving today. [place] was [place]. Prescriptions were written for Zofran, Fioricet, and tylenol #3. The patient was advised to not drink alcohol, drive, or operate heavy machinery while taking the tylenol #3. She was informed norco would not be given today. The patient was instructed to follow up with her ophthalmologist, a neurologist, and her family physician. Return to ED if condition worsens.      FINAL IMPRESSION      1. Nonintractable headache, unspecified chronicity pattern, unspecified headache type          DISPOSITION/PLAN   DISPOSITION Decision to Discharge    PATIENT REFERRED TO:   follow up with your ophthalmologist          Dierdre HighmanJennifer L Delaney, CNP  [address]  [place]  [id number]  [phone]          [facility]  [address]  [place] [id number]  [phone]    As needed, If symptoms worsen    Omer JackStephen Y Reed, MD  [address]  [place] [phone]  [phone]          [facility]  [address]  [address]  [place] [phone]          Shellia Cleverlyed E Barber, MD  [address]  [place] [id number]  [phone]            DISCHARGE MEDICATIONS:     Discharge Medication List as of 03/19/2014  4:57 PM      START taking these medications    Details   acetaminophen-codeine (TYLENOL/CODEINE #3) 300-30 MG per tablet Take 1 tablet by mouth every 8 hours as needed for Pain, Disp-6 tablet, R-0      butalbital-acetaminophen-caffeine (FIORICET) 50-325-40 MG per tablet Take 1 tablet by mouth every 4 hours as needed for Pain or Headaches, Disp-20 tablet, R-0                 (Please note that portions of this note were completed with a voice recognition program.  Efforts were made to edit the dictations but occasionally words are mis-transcribed.)    Carolyn StareKristin M Alexande Sheerin, NP    Carolyn StareKristin M Raizel Wesolowski, NP  03/19/14 1744

## 2014-03-21 LAB — POCT URINE PREGNANCY: HCG, Pregnancy Urine (POC): NEGATIVE

## 2014-12-28 LAB — COMPREHENSIVE METABOLIC PANEL
ALT: 25 U/L (ref 5–33)
AST: 18 U/L (ref <32)
Albumin/Globulin Ratio: 1.3 (ref 1.0–2.5)
Albumin: 4.2 g/dL (ref 3.5–5.2)
Alkaline Phosphatase: 109 U/L — ABNORMAL HIGH (ref 35–104)
Anion Gap: 17 mmol/L (ref 9–17)
BUN: 13 mg/dL (ref 6–20)
CO2: 24 mmol/L (ref 20–31)
Calcium: 9.7 mg/dL (ref 8.6–10.4)
Chloride: 96 mmol/L — ABNORMAL LOW (ref 98–107)
Creatinine: 0.6 mg/dL (ref 0.50–0.90)
GFR African American: >60 mL/min (ref >60)
GFR Non-African American: >60 mL/min (ref >60)
Glucose: 175 mg/dL — ABNORMAL HIGH (ref 70–99)
Potassium: 4.6 mmol/L (ref 3.7–5.3)
Sodium: 137 mmol/L (ref 135–144)
Total Bilirubin: 0.44 mg/dL (ref 0.3–1.2)
Total Protein: 7.4 g/dL (ref 6.4–8.3)

## 2014-12-28 LAB — LIPID PANEL
Chol/HDL Ratio: 8 — ABNORMAL HIGH (ref <5)
Cholesterol: 239 mg/dL — ABNORMAL HIGH (ref <200)
HDL: 30 mg/dL — ABNORMAL LOW (ref >40)
Triglycerides: 696 mg/dL — ABNORMAL HIGH (ref <150)

## 2014-12-28 LAB — MICROALBUMIN, UR
Creatinine, Ur: 76.1 mg/dL (ref 28.0–217.0)
Microalb, Ur: <12 mg/L
Microalb/Crt. Ratio: 16 mcg/mg creat (ref <25)

## 2014-12-28 LAB — HEMOGLOBIN A1C
Estimated Avg Glucose: 223 mg/dL
Hemoglobin A1C: 9.4 % — ABNORMAL HIGH (ref 4.0–6.0)

## 2014-12-28 LAB — LDL CHOLESTEROL, DIRECT: LDL Direct: 120 mg/dL — ABNORMAL HIGH (ref <100)

## 2015-04-25 ENCOUNTER — Inpatient Hospital Stay: Admit: 2015-04-25 | Discharge: 2015-04-25 | Discharge status: Against Medical Advice

## 2015-04-25 NOTE — Other (Addendum)
Patient Acct Nbr:  [id number]  Primary AUTH/CERT:    Primary Insurance Company Name:   [employer]  Primary Insurance Plan Name:    Primary Insurance Group Number:    Primary Insurance Plan Type: W  Primary Insurance Policy Number:  [id number]

## 2015-04-26 LAB — EKG 12-LEAD
Atrial Rate: 79 {beats}/min
P Axis: 52 degrees
P-R Interval: 140 ms
Q-T Interval: 406 ms
QRS Duration: 92 ms
QTc Calculation (Bazett): 465 ms
R Axis: 57 degrees
T Axis: 36 degrees
Ventricular Rate: 79 {beats}/min

## 2015-12-19 DIAGNOSIS — R51 Headache: Secondary | ICD-10-CM

## 2015-12-19 NOTE — ED Notes (Signed)
Pt presents to ED per self with reports of headache, blurred vision and nausea. Pt reports an onset of about 3 days ago. States that she has been taking OTC Motrin every 3 hours without relief. Pt states that she drove herself here tonight. Associated s/sx include light sensitivity. Neuro assessment negative.      Carolyn KenningJeania L Grace Haggart, RN  12/19/15 [phone]

## 2015-12-19 NOTE — ED Provider Notes (Signed)
[facility]  eMERGENCY dEPARTMENT eNCOUnter      Pt Name: Carolyn MooreKara M Crane  MRN: [id number]  Birthdate 04/25/1976  Date of evaluation: 12/19/2015  Provider: Pecola LawlessEDNA A Jaquez Farrington, NP    CHIEF COMPLAINT       Chief Complaint   Patient presents with   ??? Headache   ??? Blurred Vision         HISTORY OF PRESENT ILLNESS  (Location/Symptom, Timing/Onset, Context/Setting, Quality, Duration, Modifying Factors, Severity.)   Carolyn MooreKara M Crane is a 40 y.o. female who presents to the emergency department via private auto with complaints of headache that started 3 days ago.  She complains of frontal headache that she rates at a 5 and describes as if her head is in a vice.  Pain radiates to the teeth and jaws bilaterally.  She denies dental pain and states that she has recently gotten a clear checkup with her dentist.  She denies nasal congestion or facial pressure.  She does admit to blurred vision but thinks that could be because her sugars "may be out of control".  She is diabetic and does not check her sugar at home.  She does continue to give herself insulin.  She is out of her Invokana. She does have a refill available but has not yet picked it up. She has attempted to use Motrin every 3 hours without improvement.  Tylenol has also been ineffective.  She does not have a history of frequent headaches but does occasionally get headaches severe enough to cause her to come to the emergency room.  She states that this pain feels similar to her previous headaches aside from the radiation to the jaw.  No fevers, numbness or tingling or focal weakness. She denies neck stiffness or fevers. Accu-Chek in the emergency room shows blood sugar of 356.    Nursing Notes were reviewed.    ALLERGIES     Review of patient's allergies indicates no known allergies.    CURRENT MEDICATIONS       Previous Medications    ALPRAZOLAM (XANAX) 1 MG TABLET    Take 1 mg by mouth as needed for Sleep or Anxiety.    CANAGLIFLOZIN (INVOKANA) 300 MG TABS TABLET     Take 300 mg by mouth every morning (before breakfast)    IBUPROFEN (ADVIL;MOTRIN) 800 MG TABLET    Take 1 tablet by mouth every 8 hours as needed for Pain or Fever    INSULIN ASPART (NOVOLOG FLEXPEN) 100 UNIT/ML INJECTION PEN    Inject  into the skin 3 times daily (before meals). Sliding scale    INSULIN GLARGINE (BASAGLAR KWIKPEN SC)    Inject 80 Units into the skin once    METFORMIN (GLUCOPHAGE) 1000 MG TABLET    Take 1,000 mg by mouth 2 times daily (with meals).    OMEGA 3 1000 MG CAPS    Take by mouth    PAROXETINE HCL (PAXIL PO)    Take by mouth    RISPERIDONE (RISPERDAL PO)    Take by mouth       PAST MEDICAL HISTORY         Diagnosis Date   ??? Bipolar 1 disorder (HCC)    ??? Depression    ??? Diabetes mellitus (HCC)    ??? Hyperlipidemia    ??? Hypertension        SURGICAL HISTORY           Procedure Laterality Date   ??? APPENDECTOMY     ???  CHOLECYSTECTOMY     ??? TONSILLECTOMY           FAMILY HISTORY     History reviewed. No pertinent family history.  No family status information on file.        SOCIAL HISTORY      reports that she has never smoked. She does not have any smokeless tobacco history on file. She reports that she drinks alcohol. She reports that she does not use illicit drugs.    REVIEW OF SYSTEMS    (2-9 systems for level 4, 10 or more for level 5)     Review of Systems   Constitutional: Negative for chills, diaphoresis, fatigue and fever.   HENT: Negative for congestion, rhinorrhea and sinus pressure.    Eyes: Positive for visual disturbance. Negative for photophobia and pain.   Respiratory: Negative for cough and shortness of breath.    Cardiovascular: Negative for chest pain and palpitations.   Gastrointestinal: Negative for abdominal pain, constipation, diarrhea, nausea and vomiting.   Genitourinary: Negative for dysuria and frequency.   Musculoskeletal: Negative for neck pain and neck stiffness.   Neurological: Positive for headaches. Negative for dizziness, weakness and numbness.      Except as noted  above the remainder of the review of systems was reviewed and negative.     PHYSICAL EXAM    (up to 7 for level 4, 8 or more for level 5)   ED Triage Vitals   BP Temp Temp Source Pulse Resp SpO2 Height Weight   12/19/15 2343 12/19/15 2343 12/19/15 2343 12/19/15 2343 12/19/15 2343 12/19/15 2343 12/19/15 2343 12/19/15 2343   127/86 98.2 ??F (36.8 ??C) Oral 81 18 99 % 5\' 6"  (1.676 m) 207 lb 3.2 oz (94 kg)       Physical Exam   Constitutional: She is oriented to person, place, and time. She appears well-developed and well-nourished. She does not appear ill.   HENT:   Head: Normocephalic and atraumatic.   Mouth/Throat: Oropharynx is clear and moist.   Eyes: Conjunctivae and EOM are normal. Pupils are equal, round, and reactive to light.   Neck: Neck supple.   Full range of motion of the neck without rigidity   Cardiovascular: Normal rate and regular rhythm.    Pulmonary/Chest: Breath sounds normal. No respiratory distress.   Abdominal: Soft. Bowel sounds are normal. She exhibits no distension. There is no tenderness.   Lymphadenopathy:     She has no cervical adenopathy.   Neurological: She is alert and oriented to person, place, and time. No cranial nerve deficit.   Skin: Skin is warm and dry.         DIAGNOSTIC RESULTS     EKG: All EKG's are interpreted by the Emergency Department Physician who either signs or Co-signs this chart in the absence of a cardiologist.      RADIOLOGY:   Non-plain film images such as CT, Ultrasound and MRI are read by the radiologist. Plain radiographic images are visualized and preliminarily interpreted by the emergency physician with the below findings:      Interpretation per the Radiologist below, if available at the time of this note:    No orders to display       LABS:  Labs Reviewed   BASIC METABOLIC PANEL - Abnormal; Notable for the following:        Result Value    Glucose 367 (*)     Sodium 128 (*)     Chloride 92 (*)  All other components within normal limits   POC GLUCOSE  FINGERSTICK - Abnormal; Notable for the following:     POC Glucose 356 (*)     All other components within normal limits   POCT URINE PREGNANCY - Normal   POCT GLUCOSE - Normal   CBC WITH AUTO DIFFERENTIAL       All other labs were within normal range or not returned as of this dictation.    EMERGENCY DEPARTMENT COURSE and DIFFERENTIAL DIAGNOSIS/MDM:   Vitals:    Vitals:    12/19/15 2343   BP: 127/86   Pulse: 81   Resp: 18   Temp: 98.2 ??F (36.8 ??C)   TempSrc: Oral   SpO2: 99%   Weight: 207 lb 3.2 oz (94 kg)   Height: 5\' 6"  (1.676 m)       Medical Decision Making: 40 year old female with known history of diabetes presents with elevated blood sugar and headache.  She is afebrile and does not appear ill ill.  No neurologic deficits. Basic blood work is unremarkable aside from her elevated blood sugar.  She was hydrated with normal saline and medicated with Fioricet.  She had little relief from that medication but does not have a ride.  She has been taking Motrin with little improvement.  She has normal kidney function today and Toradol was offered which she declined.  Plan of care was discussed including additional pain medications if she can call for a ride.  She does not wish to call.  She states that she has had significant improvement in the past with Norco and/or Percocet which she would be fine with taking at home.  She was provided a prescription for Norco.  She was also educated on the importance of filling her refill of the Invokana and checking her blood sugar prior to giving herself insulin.      FINAL IMPRESSION      1. Headache, unspecified headache type    2. Hyperglycemia          DISPOSITION/PLAN   DISPOSITION Decision to Discharge    PATIENT REFERRED TO:   Richarda Blade, CNP  [address] [place] [address]  [phone]    In 3 days      [facility]  [address] [place] [address]  [phone]    If symptoms worsen      DISCHARGE MEDICATIONS:     New Prescriptions     HYDROCODONE-ACETAMINOPHEN (NORCO) 5-325 MG PER TABLET    Take 1 tablet by mouth every 6 hours as needed for Pain .       (Please note that portions of this note were completed with a voice recognition program.  Efforts were made to edit the dictations but occasionally words are mis-transcribed.)    Reynol Arnone A Jennesis Ramaswamy, NP               Julaine Fusi Field Staniszewski, NP  12/20/15 [id number]

## 2015-12-20 ENCOUNTER — Inpatient Hospital Stay
Admit: 2015-12-20 | Discharge: 2015-12-20 | Disposition: A | Discharge status: Home / Self Care | Payer: PRIVATE HEALTH INSURANCE | Attending: Emergency Medicine

## 2015-12-20 LAB — CBC WITH AUTO DIFFERENTIAL
Absolute Eos #: 0.1 10*3/uL (ref 0.0–0.4)
Absolute Lymph #: 1.6 10*3/uL (ref 1.0–4.8)
Absolute Mono #: 0.5 10*3/uL (ref 0.2–0.8)
Basophils Absolute: 0 10*3/uL (ref 0.0–0.2)
Basophils: 0 %
Eosinophils %: 2 %
Hematocrit: 41.5 % (ref 36–46)
Hemoglobin: 14.5 g/dL (ref 12.0–16.0)
Lymphocytes: 26 %
MCH: 32.4 pg (ref 26–34)
MCHC: 34.9 g/dL (ref 31–37)
MCV: 92.8 fL (ref 80–100)
MPV: 8 fL (ref 6.0–12.0)
Monocytes: 8 %
Platelets: 294 10*3/uL (ref 130–400)
RBC: 4.47 m/uL (ref 4.0–5.2)
RDW: 12.9 % (ref 11.5–14.5)
Seg Neutrophils: 64 %
Segs Absolute: 4 10*3/uL (ref 1.8–7.7)
WBC: 6.2 10*3/uL (ref 3.5–11.0)

## 2015-12-20 LAB — BASIC METABOLIC PANEL
Anion Gap: 15 mmol/L (ref 9–17)
BUN: 15 mg/dL (ref 6–20)
Bun/Cre Ratio: 19 (ref 9–20)
CO2: 21 mmol/L (ref 20–31)
Calcium: 9.2 mg/dL (ref 8.6–10.4)
Chloride: 92 mmol/L — ABNORMAL LOW (ref 98–107)
Creatinine: 0.77 mg/dL (ref 0.50–0.90)
GFR African American: >60 mL/min (ref >60)
GFR Non-African American: >60 mL/min (ref >60)
Glucose: 367 mg/dL — ABNORMAL HIGH (ref 70–99)
Potassium: 4.3 mmol/L (ref 3.7–5.3)
Sodium: 128 mmol/L — ABNORMAL LOW (ref 135–144)

## 2015-12-20 LAB — POCT URINE PREGNANCY
HCG, Pregnancy Urine (POC): NEGATIVE
Preg Test, Ur: NEGATIVE

## 2015-12-20 LAB — POCT GLUCOSE: Glucose: 356 mg/dL

## 2015-12-20 MED ORDER — HYDROCODONE-ACETAMINOPHEN 5-325 MG PO TABS
5-325 MG | ORAL_TABLET | Freq: Four times a day (QID) | ORAL | 0 refills | Status: AC | PRN
Start: 2015-12-20 — End: 2015-12-27

## 2015-12-20 MED ORDER — SODIUM CHLORIDE 0.9 % IV BOLUS
0.9 % | Freq: Once | INTRAVENOUS | Status: AC
Start: 2015-12-20 — End: 2015-12-20
  Administered 2015-12-20: 04:00:00 1000 mL via INTRAVENOUS

## 2015-12-20 MED ORDER — BUTALBITAL-APAP-CAFFEINE 50-325-40 MG PO TABS
50-325-40 MG | Freq: Once | ORAL | Status: AC
Start: 2015-12-20 — End: 2015-12-20
  Administered 2015-12-20: 04:00:00 1 via ORAL

## 2015-12-20 MED FILL — BUTALBITAL-APAP-CAFFEINE 50-325-40 MG PO TABS: 50-325-40 MG | ORAL | Qty: 1

## 2015-12-20 NOTE — Discharge Instructions (Signed)
Headache: Care Instructions  Your Care Instructions    Headaches have many possible causes. Most headaches aren't a sign of a more serious problem, and they will get better on their own. Home treatment may help you feel better faster.  The doctor has checked you carefully, but problems can develop later. If you notice any problems or new symptoms, get medical treatment right away.  Follow-up care is a key part of your treatment and safety. Be sure to make and go to all appointments, and call your doctor if you are having problems. It's also a good idea to know your test results and keep a list of the medicines you take.  How can you care for yourself at home?   Do not drive if you have taken a prescription pain medicine.   Rest in a quiet, dark room until your headache is gone. Close your eyes and try to relax or go to sleep. Don't watch TV or read.   Put a cold, moist cloth or cold pack on the painful area for 10 to 20 minutes at a time. Put a thin cloth between the cold pack and your skin.   Use a warm, moist towel or a heating pad set on low to relax tight shoulder and neck muscles.   Have someone gently massage your neck and shoulders.   Take pain medicines exactly as directed.   If the doctor gave you a prescription medicine for pain, take it as prescribed.   If you are not taking a prescription pain medicine, ask your doctor if you can take an over-the-counter medicine.   Be careful not to take pain medicine more often than the instructions allow, because you may get worse or more frequent headaches when the medicine wears off.   Do not ignore new symptoms that occur with a headache, such as a fever, weakness or numbness, vision changes, or confusion. These may be signs of a more serious problem.  To prevent headaches   Keep a headache diary so you can figure out what triggers your headaches. Avoiding triggers may help you prevent headaches. Record when each headache began, how long it lasted,  and what the pain was like (throbbing, aching, stabbing, or dull). Write down any other symptoms you had with the headache, such as nausea, flashing lights or dark spots, or sensitivity to bright light or loud noise. Note if the headache occurred near your period. List anything that might have triggered the headache, such as certain foods (chocolate, cheese, wine) or odors, smoke, bright light, stress, or lack of sleep.   Find healthy ways to deal with stress. Headaches are most common during or right after stressful times. Take time to relax before and after you do something that has caused a headache in the past.   Try to keep your muscles relaxed by keeping good posture. Check your jaw, face, neck, and shoulder muscles for tension, and try relaxing them. When sitting at a desk, change positions often, and stretch for 30 seconds each hour.   Get plenty of sleep and exercise.   Eat regularly and well. Long periods without food can trigger a headache.   Treat yourself to a massage. Some people find that regular massages are very helpful in relieving tension.   Limit caffeine by not drinking too much coffee, tea, or soda. But don't quit caffeine suddenly, because that can also give you headaches.   Reduce eyestrain from computers by blinking frequently and looking away from the   computer screen every so often. Make sure you have proper eyewear and that your monitor is set up properly, about an arm's length away.   Seek help if you have depression or anxiety. Your headaches may be linked to these conditions. Treatment can both prevent headaches and help with symptoms of anxiety or depression.  When should you call for help?  Call 911 anytime you think you may need emergency care. For example, call if:   You have signs of a stroke. These may include:   Sudden numbness, paralysis, or weakness in your face, arm, or leg, especially on only one side of your body.   Sudden vision changes.   Sudden trouble  speaking.   Sudden confusion or trouble understanding simple statements.   Sudden problems with walking or balance.   A sudden, severe headache that is different from past headaches.  Call your doctor now or seek immediate medical care if:   You have a new or worse headache.   Your headache gets much worse.  Where can you learn more?  Go to [email or web address] and sign in to your MyChart account. Enter M271 in the Search Health Information box to learn more about "Headache: Care Instructions."     If you do not have an account, please click on the "Sign Up Now" link.  Current as of: February 08, 2015  Content Version: 11.2   2006-2017 [employer]. Care instructions adapted under license by [facility]. If you have questions about a medical condition or this instruction, always ask your healthcare professional. [employer], [employer] disclaims any warranty or liability for your use of this information.         Learning About High Blood Sugar  What is high blood sugar?  Your body turns the food you eat into glucose (sugar), which it uses for energy. But if your body isn't able to use the sugar right away, it can build up in your blood and lead to high blood sugar.  When the amount of sugar in your blood stays too high for too much of the time, you may have diabetes. Diabetes is a disease that can cause serious health problems.  The good news is that lifestyle changes may help you get your blood sugar back to normal and avoid or delay diabetes.  What causes high blood sugar?  Sugar (glucose) can build up in your blood if you:   Are overweight.   Have a family history of diabetes.   Take certain medicines, such as steroids.  What are the symptoms?  Having high blood sugar may not cause any symptoms at all. Or it may make you feel very thirsty or very hungry. You may also urinate more often than usual, have blurry vision, or lose weight without trying.  How is high blood sugar  treated?  You can take steps to lower your blood sugar level if you understand what makes it get higher. Your doctor may want you to learn how to test your blood sugar level at home. Then you can see how illness, stress, or different kinds of food or medicine raise or lower your blood sugar level.  Other tests may be needed to see if you have diabetes.  How can you prevent high blood sugar?   Watch your weight. If you're overweight, losing just a small amount of weight may help. Reducing fat around your waist is most important.   Limit the amount of calories, sweets, and unhealthy fat you eat. Ask your  doctor if a dietitian can help you. A registered dietitian can help you create meal plans that fit your lifestyle.   Get at least 30 minutes of exercise on most days of the week. Exercise helps control your blood sugar. It also helps you maintain a healthy weight. Walking is a good choice. You also may want to do other activities, such as running, swimming, cycling, or playing tennis or team sports.   If your doctor prescribed medicines, take them exactly as prescribed. Call your doctor if you think you are having a problem with your medicine. You will get more details on the specific medicines your doctor prescribes.  Follow-up care is a key part of your treatment and safety. Be sure to make and go to all appointments, and call your doctor if you are having problems. It's also a good idea to know your test results and keep a list of the medicines you take.  Where can you learn more?  Go to [email or web address] and sign in to your MyChart account. Enter O108 in the Search Health Information box to learn more about "Learning About High Blood Sugar."     If you do not have an account, please click on the "Sign Up Now" link.  Current as of: Sep 17, 2014  Content Version: 11.2   2006-2017 [employer], [employer]. Care instructions adapted under license by [facility]. If you have questions about  a medical condition or this instruction, always ask your healthcare professional. [employer] disclaims any warranty or liability for your use of this information.

## 2015-12-20 NOTE — ED Notes (Signed)
Pt reports to writer that she has not taken her insulin in "some time." Pt states that she does not check her blood glucose at home and currently needs a script for her Invokana.      Geanie Kenning, RN  12/20/15 [phone]

## 2016-01-01 ENCOUNTER — Emergency Department: Admit: 2016-01-01 | Payer: PRIVATE HEALTH INSURANCE | Primary: Nurse Practitioner

## 2016-01-01 ENCOUNTER — Inpatient Hospital Stay
Admit: 2016-01-01 | Discharge: 2016-01-02 | Disposition: A | Discharge status: Home / Self Care | Payer: PRIVATE HEALTH INSURANCE | Attending: Personal Emergency Response Attendant

## 2016-01-01 DIAGNOSIS — R600 Localized edema: Secondary | ICD-10-CM

## 2016-01-01 LAB — CBC WITH AUTO DIFFERENTIAL
Absolute Eos #: 0.2 10*3/uL (ref 0.0–0.4)
Absolute Lymph #: 2.3 10*3/uL (ref 1.0–4.8)
Absolute Mono #: 0.6 10*3/uL (ref 0.2–0.8)
Basophils Absolute: 0 10*3/uL (ref 0.0–0.2)
Basophils: 1 %
Eosinophils %: 3 %
Hematocrit: 38 % (ref 36–46)
Hemoglobin: 12.9 g/dL (ref 12.0–16.0)
Lymphocytes: 33 %
MCH: 31.6 pg (ref 26–34)
MCHC: 33.9 g/dL (ref 31–37)
MCV: 93.4 fL (ref 80–100)
MPV: 7.5 fL (ref 6.0–12.0)
Monocytes: 8 %
Platelets: 338 10*3/uL (ref 130–400)
RBC: 4.07 m/uL (ref 4.0–5.2)
RDW: 12.9 % (ref 11.5–14.5)
Seg Neutrophils: 55 %
Segs Absolute: 4 10*3/uL (ref 1.8–7.7)
WBC: 7.1 10*3/uL (ref 3.5–11.0)

## 2016-01-01 LAB — BASIC METABOLIC PANEL
Anion Gap: 13 mmol/L (ref 9–17)
BUN: 17 mg/dL (ref 6–20)
Bun/Cre Ratio: 28 — ABNORMAL HIGH (ref 9–20)
CO2: 27 mmol/L (ref 20–31)
Calcium: 9.4 mg/dL (ref 8.6–10.4)
Chloride: 99 mmol/L (ref 98–107)
Creatinine: 0.61 mg/dL (ref 0.50–0.90)
GFR African American: >60 mL/min (ref >60)
GFR Non-African American: >60 mL/min (ref >60)
Glucose: 138 mg/dL — ABNORMAL HIGH (ref 70–99)
Potassium: 4.3 mmol/L (ref 3.7–5.3)
Sodium: 139 mmol/L (ref 135–144)

## 2016-01-01 LAB — POCT URINE PREGNANCY: Preg Test, Ur: NEGATIVE

## 2016-01-01 LAB — BRAIN NATRIURETIC PEPTIDE: Pro-BNP: 76 pg/mL (ref <300)

## 2016-01-01 LAB — POC GLUCOSE FINGERSTICK: POC Glucose: 153 mg/dL — ABNORMAL HIGH (ref 65–105)

## 2016-01-01 NOTE — ED Notes (Signed)
Pt concerned about the swelling in her ankles and calk.  Also c/o back pain.  Pt is unsure if the pain and swelling are related t her kidneys or her heart.  Pt states she hospitalized for heart issues 4 years ago.  Pt states she has uncontrolled diabetes and has been running in the 300's.  Bedside blood sugar was 153.  Ekg done at bedside and given to attending for eval.  Pt states she has been having burning urination, is on her cycle so blood is also in urine.  Pt c/o blurred vision.       Schuyler Amor, LPN  98/11/91 [id number]

## 2016-01-01 NOTE — Discharge Instructions (Signed)
Use compression stockings for the swelling.  Follow up with your primary care provider.  Return to the emergency room for any new or worsening symptoms.    Leg and Ankle Edema: Care Instructions  Your Care Instructions  Swelling in the legs, ankles, and feet is called edema. It is common after you sit or stand for a while. Long plane flights or car rides often cause swelling in the legs and feet. You may also have swelling if you have to stand for long periods of time at your job. Problems with the veins in the legs (varicose veins) and changes in hormones can also cause swelling. Sometimes the swelling in the ankles and feet is caused by a more serious problem, such as heart failure, infection, blood clots, or liver or kidney disease.  Follow-up care is a key part of your treatment and safety. Be sure to make and go to all appointments, and call your doctor if you are having problems. It's also a good idea to know your test results and keep a list of the medicines you take.  How can you care for yourself at home?   If your doctor gave you medicine, take it as prescribed. Call your doctor if you think you are having a problem with your medicine.   Whenever you are resting, raise your legs up. Try to keep the swollen area higher than the level of your heart.   Take breaks from standing or sitting in one position.   Walk around to increase the blood flow in your lower legs.   Move your feet and ankles often while you stand, or tighten and relax your leg muscles.   Wear support stockings. Put them on in the morning, before swelling gets worse.   Eat a balanced diet. Lose weight if you need to.   Limit the amount of salt (sodium) in your diet. Salt holds fluid in the body and may increase swelling.  When should you call for help?  Call 911 anytime you think you may need emergency care. For example, call if:   You have symptoms of a blood clot in your lung (called a pulmonary embolism). These may  include:   Sudden chest pain.   Trouble breathing.   Coughing up blood.  Call your doctor now or seek immediate medical care if:   You have signs of a blood clot, such as:   Pain in your calf, back of the knee, thigh, or groin.   Redness and swelling in your leg or groin.   You have symptoms of infection, such as:   Increased pain, swelling, warmth, or redness.   Red streaks or pus.   A fever.  Watch closely for changes in your health, and be sure to contact your doctor if:   Your swelling is getting worse.   You have new or worsening pain in your legs.   You do not get better as expected.  Where can you learn more?  Go to [email or web address] and sign in to your MyChart account. Enter [phone] in the Search Health Information box to learn more about "Leg and Ankle Edema: Care Instructions."     If you do not have an account, please click on the "Sign Up Now" link.  Current as of: Sep 21, 2014  Content Version: 11.2   2006-2017 [employer], [employer]. Care instructions adapted under license by [facility]. If you have questions about a medical condition or this instruction, always ask your healthcare professional. [employer]  [employer] disclaims any warranty or liability for your use of this information.

## 2016-01-01 NOTE — ED Provider Notes (Signed)
[facility] ED  eMERGENCY dEPARTMENT eNCOUnter      Pt Name: Carolyn Crane  MRN: [id number]  Birthdate 1975-05-08  Date of evaluation: 01/01/2016  Provider: Pecola Lawless, NP    CHIEF COMPLAINT       Chief Complaint   Patient presents with   ??? Leg Swelling   ??? Back Pain         HISTORY OF PRESENT ILLNESS  (Location/Symptom, Timing/Onset, Context/Setting, Quality, Duration, Modifying Factors, Severity.)   Carolyn Crane is a 40 y.o. female who presents to the emergency department Via private auto with complaints of back pain and leg swelling that started 3 days ago.  She has Bilateral low back pain that she rates at an 8 and describes as aching.  Pain is constant without aggravating or alleviating factors.  Associated symptoms include swelling to the ankles, calves and abdomen.  She denies chest pain or shortness of breath.  She does have urinary frequency and mild dysuria without hematuria.  No abdominal pain or fevers.  She is concerned about kidney function and her heart.  She states that she was hospitalized several years ago for leg swelling.  She states that she had a cardiac workup at that time but was not diagnosed with CHF.      Nursing Notes were reviewed.    ALLERGIES     Review of patient's allergies indicates no known allergies.    CURRENT MEDICATIONS       Discharge Medication List as of 01/01/2016  8:49 PM      CONTINUE these medications which have NOT CHANGED    Details   citalopram (CELEXA) 20 MG tablet Take 20 mg by mouth dailyHistorical Med      ARIPiprazole (ABILIFY) 10 MG tablet Take 10 mg by mouth dailyHistorical Med      lisinopril (PRINIVIL;ZESTRIL) 10 MG tablet Take 10 mg by mouth dailyHistorical Med      Insulin Glargine (BASAGLAR KWIKPEN SC) Inject 80 Units into the skin onceHistorical Med      Omega 3 1000 MG CAPS Take by mouthHistorical Med      canagliflozin (INVOKANA) 300 MG TABS tablet Take 300 mg by mouth every morning (before breakfast)      insulin aspart (NOVOLOG FLEXPEN) 100  UNIT/ML injection pen Inject  into the skin 3 times daily (before meals). Sliding scale      metFORMIN (GLUCOPHAGE) 1000 MG tablet Take 1,000 mg by mouth 2 times daily (with meals).      ALPRAZolam (XANAX) 1 MG tablet Take 1 mg by mouth as needed for Sleep or Anxiety.      PARoxetine HCl (PAXIL PO) Take by mouthHistorical Med      RisperiDONE (RISPERDAL PO) Take by mouthHistorical Med      ibuprofen (ADVIL;MOTRIN) 800 MG tablet Take 1 tablet by mouth every 8 hours as needed for Pain or Fever, Disp-15 tablet, R-0             PAST MEDICAL HISTORY         Diagnosis Date   ??? Bipolar 1 disorder (HCC)    ??? Depression    ??? Diabetes mellitus (HCC)    ??? Hyperlipidemia    ??? Hypertension        SURGICAL HISTORY           Procedure Laterality Date   ??? APPENDECTOMY     ??? CHOLECYSTECTOMY     ??? TONSILLECTOMY  FAMILY HISTORY     History reviewed. No pertinent family history.  No family status information on file.        SOCIAL HISTORY      reports that she has never smoked. She does not have any smokeless tobacco history on file. She reports that she drinks alcohol. She reports that she does not use illicit drugs.    REVIEW OF SYSTEMS    (2-9 systems for level 4, 10 or more for level 5)     Review of Systems   All other systems reviewed and are negative.     Except as noted above the remainder of the review of systems was reviewed and negative.     PHYSICAL EXAM    (up to 7 for level 4, 8 or more for level 5)   ED Triage Vitals   BP Temp Temp Source Pulse Resp SpO2 Height Weight   01/01/16 1848 01/01/16 1848 01/01/16 1848 01/01/16 1848 01/01/16 1848 01/01/16 1848 01/01/16 1848 01/01/16 1848   140/83 97.7 ??F (36.5 ??C) Oral 77 16 96 % 5\' 6"  (1.676 m) 213 lb (96.6 kg)       Physical Exam   Constitutional: She is oriented to person, place, and time. She appears well-developed and well-nourished. She does not appear ill. No distress.   HENT:   Head: Normocephalic and atraumatic.   Mouth/Throat: Oropharynx is clear and moist.    Neck: Neck supple.   Cardiovascular: Normal rate and regular rhythm.    Pulmonary/Chest: Breath sounds normal. No respiratory distress. She has no wheezes. She has no rales.   Abdominal: Soft. Bowel sounds are normal. There is no tenderness.   No distention, edema or ascites   Musculoskeletal: Normal range of motion. She exhibits no tenderness.   2+ pitting edema to the lower legs.  Nonpitting edema to the ankles and dorsum of the bilateral mid feet.  No pitting to the knees or thighs   Lymphadenopathy:     She has no cervical adenopathy.   Neurological: She is alert and oriented to person, place, and time.   Skin: Skin is warm and dry.   No erythema, ecchymosis or abrasions.  Skin is not hot to touch         DIAGNOSTIC RESULTS     EKG: All EKG's are interpreted by the Emergency Department Physician who either signs or Co-signs this chart in the absence of a cardiologist.      RADIOLOGY:   Non-plain film images such as CT, Ultrasound and MRI are read by the radiologist. Plain radiographic images are visualized and preliminarily interpreted by the emergency physician with the below findings:      Interpretation per the Radiologist below, if available at the time of this note:    XR Chest Standard TWO VW   Final Result   No evidence for acute cardiopulmonary pathology.               LABS:  Labs Reviewed   UA W/REFLEX CULTURE - Abnormal; Notable for the following:        Result Value    Color, UA RED (*)     Turbidity UA CLOUDY (*)     Glucose, Ur 1+ (*)     Urine Hgb 3+ (*)     Protein, UA TRACE (*)     All other components within normal limits   BASIC METABOLIC PANEL - Abnormal; Notable for the following:     Glucose 138 (*)  Bun/Cre Ratio 28 (*)     All other components within normal limits   MICROSCOPIC URINALYSIS - Abnormal; Notable for the following:     Bacteria, UA MODERATE (*)     Mucus, UA 2+ (*)     All other components within normal limits   POC GLUCOSE FINGERSTICK - Abnormal; Notable for the  following:     POC Glucose 153 (*)     All other components within normal limits   POCT URINE PREGNANCY - Normal   URINE CULTURE CLEAN CATCH   CBC WITH AUTO DIFFERENTIAL   BRAIN NATRIURETIC PEPTIDE       All other labs were within normal range or not returned as of this dictation.    EMERGENCY DEPARTMENT COURSE and DIFFERENTIAL DIAGNOSIS/MDM:   Vitals:    Vitals:    01/01/16 1848   BP: (!) 140/83   Pulse: 77   Resp: 16   Temp: 97.7 ??F (36.5 ??C)   TempSrc: Oral   SpO2: 96%   Weight: 213 lb (96.6 kg)   Height: 5\' 6"  (1.676 m)       Medical Decision Making: 40 year old female with back pain and edema. She is afebrile and nontoxic in appearance.  Physical exam is benign.  She has a normal white count and kidney function. BNP is also negative. Chest x-ray does not have any acute findings. She has moderate bacteria in the urine with only 0-2 WBCs, negative nitrites and negative leukocyte esterase.  She does have 3+ hemoglobin but she is on her menses.  She is having some mild dysuria and will be started on Macrobid.  She was advised to use compression stockings for the edema and follow up with her primary care provider.      FINAL IMPRESSION      1. Peripheral edema    2. Bacteria in urine          DISPOSITION/PLAN   DISPOSITION Decision to Discharge    PATIENT REFERRED TO:   Richarda Blade, CNP  [address] [place] [address]  [phone]    In 3 days      [facility] ED  [address] [place] [address]  [phone]    If symptoms worsen      DISCHARGE MEDICATIONS:     Discharge Medication List as of 01/01/2016  8:49 PM      START taking these medications    Details   nitrofurantoin, macrocrystal-monohydrate, (MACROBID) 100 MG capsule Take 1 capsule by mouth 2 times daily for 5 days, Disp-10 capsule, R-0Print             (Please note that portions of this note were completed with a voice recognition program.  Efforts were made to edit the dictations but occasionally words are mis-transcribed.)    Mohogany Toppins A Alyan Hartline,  NP               Julaine Fusi Dublin Grayer, NP  01/01/16 2318

## 2016-01-01 NOTE — ED Notes (Signed)
Urine sample collected, dipped and sent ot lab.  Results are on the chart.       Schuyler Amor, LPN  16/10/96 0454

## 2016-01-01 NOTE — ED Provider Notes (Signed)
Attending Supervisory Note/Shared Visit   I have personally performed a face to face diagnostic evaluation on this patient. I have reviewed the mid-level???s findings and agree.  History and Exam by me shows peripheral edema      (Please note that portions of this note were completed with a voice recognition program.  Efforts were made to edit the dictations but occasionally words are mis-transcribed.)    Christie Nottingham, MD  Attending Emergency Physician       Christie Nottingham, MD  01/01/16 2232

## 2016-01-02 LAB — EKG 12-LEAD
Atrial Rate: 75 {beats}/min
P Axis: 37 degrees
P-R Interval: 150 ms
Q-T Interval: 404 ms
QRS Duration: 84 ms
QTc Calculation (Bazett): 451 ms
R Axis: 63 degrees
T Axis: 44 degrees
Ventricular Rate: 75 {beats}/min

## 2016-01-02 LAB — UA W/REFLEX CULTURE
Bilirubin Urine: NEGATIVE
Ketones, Urine: NEGATIVE
Leukocyte Esterase, Urine: NEGATIVE
Nitrite, Urine: NEGATIVE
Specific Gravity, UA: 1.03 (ref 1.005–1.030)
Urobilinogen, Urine: NORMAL
pH, UA: 6 (ref 5.0–8.0)

## 2016-01-02 LAB — POCT URINE PREGNANCY: HCG, Pregnancy Urine (POC): NEGATIVE

## 2016-01-02 LAB — MICROSCOPIC URINALYSIS
Epithelial Cells UA: 10 /HPF
RBC, UA: 2 /HPF (ref 0–2)
WBC, UA: 0 /HPF (ref 0–5)

## 2016-01-02 MED ORDER — NITROFURANTOIN MONOHYD MACRO 100 MG PO CAPS
100 MG | ORAL_CAPSULE | Freq: Two times a day (BID) | ORAL | 0 refills | Status: AC
Start: 2016-01-02 — End: 2016-01-06

## 2016-01-03 LAB — URINE CULTURE CLEAN CATCH

## 2016-01-22 ENCOUNTER — Inpatient Hospital Stay
Admit: 2016-01-22 | Discharge: 2016-01-22 | Disposition: A | Discharge status: Home / Self Care | Payer: PRIVATE HEALTH INSURANCE | Attending: Emergency Medicine

## 2016-01-22 DIAGNOSIS — R51 Headache: Secondary | ICD-10-CM

## 2016-01-22 MED ORDER — HYDROCODONE-ACETAMINOPHEN 5-325 MG PO TABS
5-325 MG | Freq: Once | ORAL | Status: AC
Start: 2016-01-22 — End: 2016-01-22
  Administered 2016-01-22: 15:00:00 1 via ORAL

## 2016-01-22 MED ORDER — HYDROCODONE-ACETAMINOPHEN 5-325 MG PO TABS
5-325 MG | ORAL_TABLET | Freq: Four times a day (QID) | ORAL | 0 refills | Status: DC | PRN
Start: 2016-01-22 — End: 2016-06-29

## 2016-01-22 MED FILL — HYDROCODONE-ACETAMINOPHEN 5-325 MG PO TABS: 5-325 MG | ORAL | Qty: 1

## 2016-01-22 NOTE — ED Notes (Signed)
URINE PREGNANCY TEST-  NEGATIVE.     Carolyn Ivoryonald Nemesio Castrillon, RN  01/22/16 1056

## 2016-01-22 NOTE — Discharge Instructions (Signed)
Headache: Care Instructions  Your Care Instructions    Headaches have many possible causes. Most headaches aren't a sign of a more serious problem, and they will get better on their own. Home treatment may help you feel better faster.  The doctor has checked you carefully, but problems can develop later. If you notice any problems or new symptoms, get medical treatment right away.  Follow-up care is a key part of your treatment and safety. Be sure to make and go to all appointments, and call your doctor if you are having problems. It's also a good idea to know your test results and keep a list of the medicines you take.  How can you care for yourself at home?   Do not drive if you have taken a prescription pain medicine.   Rest in a quiet, dark room until your headache is gone. Close your eyes and try to relax or go to sleep. Don't watch TV or read.   Put a cold, moist cloth or cold pack on the painful area for 10 to 20 minutes at a time. Put a thin cloth between the cold pack and your skin.   Use a warm, moist towel or a heating pad set on low to relax tight shoulder and neck muscles.   Have someone gently massage your neck and shoulders.   Take pain medicines exactly as directed.   If the doctor gave you a prescription medicine for pain, take it as prescribed.   If you are not taking a prescription pain medicine, ask your doctor if you can take an over-the-counter medicine.   Be careful not to take pain medicine more often than the instructions allow, because you may get worse or more frequent headaches when the medicine wears off.   Do not ignore new symptoms that occur with a headache, such as a fever, weakness or numbness, vision changes, or confusion. These may be signs of a more serious problem.  To prevent headaches   Keep a headache diary so you can figure out what triggers your headaches. Avoiding triggers may help you prevent headaches. Record when each headache began, how long it lasted,  and what the pain was like (throbbing, aching, stabbing, or dull). Write down any other symptoms you had with the headache, such as nausea, flashing lights or dark spots, or sensitivity to bright light or loud noise. Note if the headache occurred near your period. List anything that might have triggered the headache, such as certain foods (chocolate, cheese, wine) or odors, smoke, bright light, stress, or lack of sleep.   Find healthy ways to deal with stress. Headaches are most common during or right after stressful times. Take time to relax before and after you do something that has caused a headache in the past.   Try to keep your muscles relaxed by keeping good posture. Check your jaw, face, neck, and shoulder muscles for tension, and try relaxing them. When sitting at a desk, change positions often, and stretch for 30 seconds each hour.   Get plenty of sleep and exercise.   Eat regularly and well. Long periods without food can trigger a headache.   Treat yourself to a massage. Some people find that regular massages are very helpful in relieving tension.   Limit caffeine by not drinking too much coffee, tea, or soda. But don't quit caffeine suddenly, because that can also give you headaches.   Reduce eyestrain from computers by blinking frequently and looking away from the   computer screen every so often. Make sure you have proper eyewear and that your monitor is set up properly, about an arm's length away.   Seek help if you have depression or anxiety. Your headaches may be linked to these conditions. Treatment can both prevent headaches and help with symptoms of anxiety or depression.  When should you call for help?  Call 911 anytime you think you may need emergency care. For example, call if:   You have signs of a stroke. These may include:   Sudden numbness, paralysis, or weakness in your face, arm, or leg, especially on only one side of your body.   Sudden vision changes.   Sudden trouble  speaking.   Sudden confusion or trouble understanding simple statements.   Sudden problems with walking or balance.   A sudden, severe headache that is different from past headaches.  Call your doctor now or seek immediate medical care if:   You have a new or worse headache.   Your headache gets much worse.  Where can you learn more?  Go to [email or web address] and sign in to your MyChart account. Enter M271 in the Search Health Information box to learn more about "Headache: Care Instructions."     If you do not have an account, please click on the "Sign Up Now" link.  Current as of: February 08, 2015  Content Version: 11.3   2006-2017 [employer]. Care instructions adapted under license by [facility]. If you have questions about a medical condition or this instruction, always ask your healthcare professional. [employer] disclaims any warranty or liability for your use of this information.

## 2016-01-22 NOTE — ED Notes (Signed)
ASSESSMENT:    Presents to ED per pvt auto. Fiancee's dad is an inpatient and just been Dx with [facility].  Patient been under a lot of stress lately.  C/o h/a pain.  Never Dx with migraines but this is her 2 major h/a in last 2 weeks.  OTC migraine meds not working.  Is light and noise sensitive.  Feels nauseated.  Awoke with this h/a about 0530 this am and is getting worse.  Points to frontal area as area of pain.     Eliberto Ivoryonald Nicandro Perrault, RN  01/22/16 1056

## 2016-01-22 NOTE — ED Notes (Signed)
Exam  Per Dr Devin Goingkatko     Frederich Montilla, RN  01/22/16 [phone]

## 2016-01-22 NOTE — ED Provider Notes (Signed)
[place] [facility] ED  eMERGENCY dEPARTMENT eNCOUnter      Pt Name: Carolyn Crane  MRN: [id number]  Birthdate Sep 22, 1975  Date of evaluation: 01/22/2016  Provider: Cherie Ouch, MD    CHIEF COMPLAINT       Chief Complaint   Patient presents with   ??? Headache     onset this am,  seen 2 weeks ago for same         HISTORY OF PRESENT ILLNESS  (Location/Symptom, Timing/Onset, Context/Setting, Quality, Duration, Modifying Factors, Severity.)   Carolyn Crane is a 40 y.o. female who presents to the emergency department For a headache.  This started at 3 this morning when she was in bed.  No trauma fever or stiff neck and it's mostly frontal.  It's continuous.  This is much like headaches she's had in the past.  She rates as a 6 and it's aching and not associated with any weakness or numbness.       Nursing Notes were reviewed.    ALLERGIES     Review of patient's allergies indicates no known allergies.    CURRENT MEDICATIONS       Previous Medications    ALPRAZOLAM (XANAX) 1 MG TABLET    Take 1 mg by mouth as needed for Sleep or Anxiety.    ARIPIPRAZOLE (ABILIFY) 10 MG TABLET    Take 10 mg by mouth daily    CANAGLIFLOZIN (INVOKANA) 300 MG TABS TABLET    Take 300 mg by mouth every morning (before breakfast)    CITALOPRAM (CELEXA) 20 MG TABLET    Take 20 mg by mouth daily    IBUPROFEN (ADVIL;MOTRIN) 800 MG TABLET    Take 1 tablet by mouth every 8 hours as needed for Pain or Fever    INSULIN GLARGINE (BASAGLAR KWIKPEN SC)    Inject 80 Units into the skin once    METFORMIN (GLUCOPHAGE) 1000 MG TABLET    Take 1,000 mg by mouth 2 times daily (with meals).    OMEGA 3 1000 MG CAPS    Take by mouth       PAST MEDICAL HISTORY         Diagnosis Date   ??? Bipolar 1 disorder (HCC)    ??? Depression    ??? Diabetes mellitus (HCC)    ??? Hyperlipidemia    ??? Hypertension        SURGICAL HISTORY           Procedure Laterality Date   ??? APPENDECTOMY     ??? CHOLECYSTECTOMY     ??? TONSILLECTOMY           FAMILY HISTORY     History reviewed. No  pertinent family history.  No family status information on file.        SOCIAL HISTORY      reports that she has never smoked. She does not have any smokeless tobacco history on file. She reports that she drinks alcohol. She reports that she does not use illicit drugs.    REVIEW OF SYSTEMS    (2-9 systems for level 4, 10 or more for level 5)     Review of Systems   Constitutional: Negative for chills, fatigue and fever.   HENT: Negative for congestion, ear discharge and facial swelling.    Eyes: Negative for discharge and redness.   Respiratory: Negative for cough and shortness of breath.    Cardiovascular: Negative for chest pain.   Gastrointestinal: Negative for abdominal pain,  constipation, diarrhea and vomiting.   Genitourinary: Negative for dysuria and hematuria.   Musculoskeletal: Negative for arthralgias.   Skin: Negative for color change and rash.   Neurological: Negative for syncope, numbness and headaches.   Hematological: Negative for adenopathy.   Psychiatric/Behavioral: Negative for confusion. The patient is not nervous/anxious.       Except as noted above the remainder of the review of systems was reviewed and negative.     PHYSICAL EXAM    (up to 7 for level 4, 8 or more for level 5)     Vitals:    01/22/16 1005   BP: 131/85   Pulse: 93   Resp: 16   Temp: 98.3 ??F (36.8 ??C)   TempSrc: Oral   SpO2: 97%   Weight: 207 lb 1.6 oz (93.9 kg)   Height: 5\' 6"  (1.676 m)       Physical Exam   Constitutional: She is oriented to person, place, and time. She appears well-developed and well-nourished. No distress.   HENT:   Head: Normocephalic and atraumatic.   Eyes: Right eye exhibits no discharge. Left eye exhibits no discharge. No scleral icterus.   Neck: Neck supple.   Cardiovascular: Normal rate and regular rhythm.    Pulmonary/Chest: Effort normal and breath sounds normal. No stridor. No respiratory distress. She has no wheezes. She has no rales.   Abdominal: Soft. She exhibits no distension. There is no  tenderness.   Musculoskeletal: Normal range of motion.   Lymphadenopathy:     She has no cervical adenopathy.   Neurological: She is alert and oriented to person, place, and time.   Skin: Skin is warm and dry. No rash noted. She is not diaphoretic. No erythema.   Psychiatric: She has a normal mood and affect. Her behavior is normal.   Vitals reviewed.        DIAGNOSTIC RESULTS     EKG: All EKG's are interpreted by the Emergency Department Physician who either signs or Co-signs this chart in the absence of a cardiologist.    Not indicated    RADIOLOGY:   Non-plain film images such as CT, Ultrasound and MRI are read by the radiologist. Plain radiographic images are visualized and preliminarily interpreted by the emergency physician with the below findings:    Not indicated    Interpretation per the Radiologist below, if available at the time of this note:        LABS:  Labs Reviewed - No data to display    All other labs were within normal range or not returned as of this dictation.    EMERGENCY DEPARTMENT COURSE and DIFFERENTIAL DIAGNOSIS/MDM:   Vitals:    Vitals:    01/22/16 1005   BP: 131/85   Pulse: 93   Resp: 16   Temp: 98.3 ??F (36.8 ??C)   TempSrc: Oral   SpO2: 97%   Weight: 207 lb 1.6 oz (93.9 kg)   Height: 5\' 6"  (1.676 m)       Orders Placed This Encounter   Medications   ??? HYDROcodone-acetaminophen (NORCO) 5-325 MG per tablet 1 tablet   ??? HYDROcodone-acetaminophen (NORCO) 5-325 MG per tablet     Sig: Take 1 tablet by mouth every 6 hours as needed for Pain .     Dispense:  12 tablet     Refill:  0       Medical Decision Making: My clinical impression is that she has a headache.  She is being treated with  Norco here and given a prescription for 12.  Treatment diagnosis and follow-up were discussed with the patient.    Controlled Substances Monitoring:     Attestation: The Prescription Monitoring Report for this patient was reviewed today. Cherie Ouch, MD)  Documentation: No signs of potential drug abuse or  diversion identified. Cherie Ouch, MD)       CONSULTS:  None    PROCEDURES:  None    FINAL IMPRESSION      1. Nonintractable headache, unspecified chronicity pattern, unspecified headache type          DISPOSITION/PLAN   DISPOSITION Decision to Discharge    PATIENT REFERRED TO:   Richarda Blade, CNP  [address] [place] [address]  [phone]      As needed    [facility] ED  [address] [place] [address]  [phone]    If symptoms worsen      DISCHARGE MEDICATIONS:     New Prescriptions    HYDROCODONE-ACETAMINOPHEN (NORCO) 5-325 MG PER TABLET    Take 1 tablet by mouth every 6 hours as needed for Pain .         (Please note that portions of this note were completed with a voice recognition program.  Efforts were made to edit the dictations but occasionally words are mis-transcribed.)    Cherie Ouch, MD  Attending Emergency Physician               Cherie Ouch, MD  01/22/16 1110

## 2016-01-23 LAB — POCT URINE PREGNANCY: HCG, Pregnancy Urine (POC): NEGATIVE

## 2016-06-29 ENCOUNTER — Inpatient Hospital Stay
Admit: 2016-06-29 | Discharge: 2016-06-29 | Disposition: A | Discharge status: Home / Self Care | Payer: PRIVATE HEALTH INSURANCE | Attending: Emergency Medicine

## 2016-06-29 DIAGNOSIS — J219 Acute bronchiolitis, unspecified: Secondary | ICD-10-CM

## 2016-06-29 MED ORDER — ACETAMINOPHEN-CODEINE 300-30 MG PO TABS
300-30 MG | ORAL_TABLET | Freq: Four times a day (QID) | ORAL | 0 refills | Status: AC | PRN
Start: 2016-06-29 — End: 2016-07-04

## 2016-06-29 MED ORDER — AZITHROMYCIN 250 MG PO TABS
250 MG | PACK | ORAL | 0 refills | Status: DC
Start: 2016-06-29 — End: 2016-09-26

## 2016-06-29 MED ORDER — BENZONATATE 100 MG PO CAPS
100 MG | ORAL_CAPSULE | Freq: Three times a day (TID) | ORAL | 0 refills | Status: DC | PRN
Start: 2016-06-29 — End: 2016-09-26

## 2016-06-29 MED ORDER — ALBUTEROL SULFATE HFA 108 (90 BASE) MCG/ACT IN AERS
108 (90 Base) MCG/ACT | RESPIRATORY_TRACT | 0 refills | Status: DC | PRN
Start: 2016-06-29 — End: 2016-09-26

## 2016-06-29 NOTE — ED Provider Notes (Signed)
[facility] ED  eMERGENCY dEPARTMENT eNCOUnter      Pt Name: Carolyn Crane  MRN: [id number]  Birthdate 1976-01-26  Date of evaluation: 06/29/2016  Provider: Cherie Ouch, MD    CHIEF COMPLAINT       Chief Complaint   Patient presents with   ??? Cough   ??? Pharyngitis   ??? Headache         HISTORY OF PRESENT ILLNESS  (Location/Symptom, Timing/Onset, Context/Setting, Quality, Duration, Modifying Factors, Severity.)   Carolyn Crane is a 41 y.o. female who presents to the emergency department For a cough.  She's been coughing up green phlegm for the last day or 2 and her chest hurts as a result.  No hemoptysis or fever.  No vomiting or diarrhea.  She has had some flank pain but that was worked up at a different hospital with a negative CAT scan and other testing a few days ago.  She rated the pain in her chest as an 8 and it's aching and continuous.       Nursing Notes were reviewed.    ALLERGIES     Patient has no known allergies.    CURRENT MEDICATIONS       Previous Medications    ALPRAZOLAM (XANAX) 1 MG TABLET    Take 1 mg by mouth as needed for Sleep or Anxiety.    ARIPIPRAZOLE (ABILIFY) 10 MG TABLET    Take 10 mg by mouth daily    CANAGLIFLOZIN (INVOKANA) 300 MG TABS TABLET    Take 300 mg by mouth every morning (before breakfast)    CITALOPRAM (CELEXA) 20 MG TABLET    Take 20 mg by mouth daily    IBUPROFEN (ADVIL;MOTRIN) 800 MG TABLET    Take 1 tablet by mouth every 8 hours as needed for Pain or Fever    INSULIN DEGLUDEC (TRESIBA FLEXTOUCH) 100 UNIT/ML SOPN    Inject 75 Units into the skin every morning (before breakfast)    INSULIN GLARGINE (BASAGLAR KWIKPEN SC)    Inject 80 Units into the skin once    METFORMIN (GLUCOPHAGE) 1000 MG TABLET    Take 1,000 mg by mouth 2 times daily (with meals).    OMEGA 3 1000 MG CAPS    Take by mouth       PAST MEDICAL HISTORY         Diagnosis Date   ??? Bipolar 1 disorder (HCC)    ??? Depression    ??? Diabetes mellitus (HCC)    ??? Hyperlipidemia    ??? Hypertension        SURGICAL  HISTORY           Procedure Laterality Date   ??? APPENDECTOMY     ??? CHOLECYSTECTOMY     ??? TONSILLECTOMY           FAMILY HISTORY     History reviewed. No pertinent family history.  No family status information on file.        SOCIAL HISTORY      reports that she has never smoked. She has never used smokeless tobacco. She reports that she drinks alcohol. She reports that she does not use drugs.    REVIEW OF SYSTEMS    (2-9 systems for level 4, 10 or more for level 5)     Review of Systems   Constitutional: Negative for chills, fatigue and fever.   HENT: Negative for congestion, ear discharge and facial swelling.    Eyes: Negative  for discharge and redness.   Respiratory: Positive for cough and wheezing. Negative for shortness of breath.    Cardiovascular: Negative for chest pain.   Gastrointestinal: Negative for abdominal pain, constipation, diarrhea and vomiting.   Genitourinary: Positive for flank pain. Negative for dysuria and hematuria.   Musculoskeletal: Negative for arthralgias.   Skin: Negative for color change and rash.   Neurological: Negative for syncope, numbness and headaches.   Hematological: Negative for adenopathy.   Psychiatric/Behavioral: Negative for confusion. The patient is not nervous/anxious.         Except as noted above the remainder of the review of systems was reviewed and negative.     PHYSICAL EXAM    (up to 7 for level 4, 8 or more for level 5)     Vitals:    06/29/16 0705   BP: 127/78   Pulse: 84   Resp: 18   Temp: 97.6 ??F (36.4 ??C)   TempSrc: Oral   SpO2: 99%   Weight: 197 lb (89.4 kg)   Height: 5\' 6"  (1.676 m)       Physical Exam   Constitutional: She is oriented to person, place, and time. She appears well-developed and well-nourished. No distress.   HENT:   Head: Normocephalic and atraumatic.   Eyes: Right eye exhibits no discharge. Left eye exhibits no discharge. No scleral icterus.   Neck: Neck supple.   Cardiovascular: Normal rate and regular rhythm.    Pulmonary/Chest: Effort  normal and breath sounds normal. No stridor. No respiratory distress. She has no wheezes. She has no rales.   Abdominal: Soft. She exhibits no distension. There is no tenderness.   Musculoskeletal: Normal range of motion.   Lymphadenopathy:     She has no cervical adenopathy.   Neurological: She is alert and oriented to person, place, and time.   Skin: Skin is warm and dry. No rash noted. She is not diaphoretic. No erythema.   Psychiatric: She has a normal mood and affect. Her behavior is normal.   Vitals reviewed.        DIAGNOSTIC RESULTS     EKG: All EKG's are interpreted by the Emergency Department Physician who either signs or Co-signs this chart in the absence of a cardiologist.    Not indicated    RADIOLOGY:   Non-plain film images such as CT, Ultrasound and MRI are read by the radiologist. Plain radiographic images are visualized and preliminarily interpreted by the emergency physician with the below findings:    Not indicated    Interpretation per the Radiologist below, if available at the time of this note:        LABS:  Labs Reviewed - No data to display    All other labs were within normal range or not returned as of this dictation.    EMERGENCY DEPARTMENT COURSE and DIFFERENTIAL DIAGNOSIS/MDM:   Vitals:    Vitals:    06/29/16 0705   BP: 127/78   Pulse: 84   Resp: 18   Temp: 97.6 ??F (36.4 ??C)   TempSrc: Oral   SpO2: 99%   Weight: 197 lb (89.4 kg)   Height: 5\' 6"  (1.676 m)       Orders Placed This Encounter   Medications   ??? azithromycin (ZITHROMAX) 250 MG tablet     Sig: Take 2 tablets (500 mg) on Day 1, followed by 1 tablet (250 mg) once daily on Days 2 through 5.     Dispense:  1 packet  Refill:  0   ??? benzonatate (TESSALON PERLES) 100 MG capsule     Sig: Take 1 capsule by mouth every 8 hours as needed for Cough     Dispense:  20 capsule     Refill:  0   ??? acetaminophen-codeine (TYLENOL/CODEINE #3) 300-30 MG per tablet     Sig: Take 1 tablet by mouth every 6 hours as needed for Pain for up to 5  days.     Dispense:  20 tablet     Refill:  0   ??? albuterol sulfate HFA (PROVENTIL HFA) 108 (90 Base) MCG/ACT inhaler     Sig: Inhale 2 puffs into the lungs every 4 hours as needed for Wheezing or Shortness of Breath (Space out to every 6 hours as symptoms improve) Space out to every 6 hours as symptoms improve.     Dispense:  1 Inhaler     Refill:  0       Medical Decision Making: My clinical impression is that the patient has acute bronchitis.  Treatment diagnosis and follow-up were discussed with the patient.     CONSULTS:  None    PROCEDURES:  None    FINAL IMPRESSION      1. Acute bronchiolitis due to unspecified organism          DISPOSITION/PLAN   DISPOSITION Decision To Discharge 06/29/2016 07:13:19 AM      PATIENT REFERRED TO:   Richarda Blade, CNP  [address] [place] [address]  [phone]      As needed      DISCHARGE MEDICATIONS:     New Prescriptions    ACETAMINOPHEN-CODEINE (TYLENOL/CODEINE #3) 300-30 MG PER TABLET    Take 1 tablet by mouth every 6 hours as needed for Pain for up to 5 days.    ALBUTEROL SULFATE HFA (PROVENTIL HFA) 108 (90 BASE) MCG/ACT INHALER    Inhale 2 puffs into the lungs every 4 hours as needed for Wheezing or Shortness of Breath (Space out to every 6 hours as symptoms improve) Space out to every 6 hours as symptoms improve.    AZITHROMYCIN (ZITHROMAX) 250 MG TABLET    Take 2 tablets (500 mg) on Day 1, followed by 1 tablet (250 mg) once daily on Days 2 through 5.    BENZONATATE (TESSALON PERLES) 100 MG CAPSULE    Take 1 capsule by mouth every 8 hours as needed for Cough         (Please note that portions of this note were completed with a voice recognition program.  Efforts were made to edit the dictations but occasionally words are mis-transcribed.)    Cherie Ouch, MD  Attending Emergency Physician             Cherie Ouch, MD  06/29/16 [phone]

## 2016-06-29 NOTE — ED Notes (Signed)
Exam per Dr Devin Going, RN  06/29/16 [phone]

## 2016-06-29 NOTE — ED Notes (Signed)
ASSESSMENT:   presentsb to ED per self per pvt auto with c/o productive yellow/ green cough, frontal h/a amd behind eyes, sore throat, upper ant chest discomfort and bilat rib pain.  Started 3 days ago and has been seen 2 times at [facility] ED, but for rib pain. Dx with chest wall pain. Pl;aced on motrin and lidoderm patch.  Had (-) flu and strep screens.  Had (-) CT scan.  Here today because since then has been coughing up sputum.  No distress on admission.     Eliberto Ivory, RN  06/29/16 [phone]

## 2016-07-12 ENCOUNTER — Emergency Department: Admit: 2016-07-12 | Payer: PRIVATE HEALTH INSURANCE | Primary: Nurse Practitioner

## 2016-07-12 ENCOUNTER — Inpatient Hospital Stay
Admit: 2016-07-12 | Discharge: 2016-07-12 | Disposition: A | Discharge status: Home / Self Care | Payer: PRIVATE HEALTH INSURANCE | Attending: Emergency Medicine

## 2016-07-12 DIAGNOSIS — E119 Type 2 diabetes mellitus without complications: Principal | ICD-10-CM

## 2016-07-12 LAB — RAPID INFLUENZA A/B ANTIGENS
Direct Exam: NEGATIVE
Direct Exam: POSITIVE — AB

## 2016-07-12 MED ORDER — GUAIFENESIN-CODEINE 100-10 MG/5ML PO SYRP
100-10 MG/5ML | Freq: Three times a day (TID) | ORAL | 0 refills | Status: AC | PRN
Start: 2016-07-12 — End: 2016-07-19

## 2016-07-12 MED ORDER — ACETAMINOPHEN 500 MG PO TABS
500 MG | Freq: Once | ORAL | Status: AC
Start: 2016-07-12 — End: 2016-07-12
  Administered 2016-07-12: 16:00:00 1000 mg via ORAL

## 2016-07-12 MED ORDER — IBUPROFEN 600 MG PO TABS
600 MG | Freq: Once | ORAL | Status: AC
Start: 2016-07-12 — End: 2016-07-12
  Administered 2016-07-12: 16:00:00 600 mg via ORAL

## 2016-07-12 MED FILL — TYLENOL EXTRA STRENGTH 500 MG PO TABS: 500 MG | ORAL | Qty: 2

## 2016-07-12 MED FILL — IBUPROFEN 600 MG PO TABS: 600 MG | ORAL | Qty: 1

## 2016-07-12 NOTE — ED Provider Notes (Signed)
I was present in the ED during this patient's evaluation and management by the Advance Practice Provider and was available to address any concerns about their medical management.    Carolyn AnesS. Zakir Harinder Romas, MD  Attending, Emergency Department      Carolyn BottsSyed Z Sybel Standish, MD  07/12/16 [phone]

## 2016-07-12 NOTE — ED Notes (Signed)
Patient c/o cough productive green, sore throat, fever, headache, R ear pain, runny nose that is green/yellow, sinus pressure, and general body aches for 4 days.  Patient has taken Tylenol cold and flu, Advil, and robitussin to help with symptoms, states medication has helped a little.  Patient has not taken any medication today. Patient states that 3 weeks [facility], was swabbed for strep throat and flu both tests were negative.  Patient states was here on March 5, diagnosed with bronchitis given Z-pack, tessalon pearls, inhaler, tylenol 3.  Patient took all of Z-pack, symptoms got better and then symptoms came back 4 days ago. Patient states that she did not use tessalon pearls that were left over b/c they do not seem to help, she did use the inhaler yesterday which did help a little bit.  Patient states still has Tylenol 3 tablets left, she states they did not help with her symptoms, so she did not take them. Patient states does has SOB with activity.  Denies smoking. Patient states she has had bronchitis and pneumonia frequently in the past.      Carolyn HaywardVirginia D Briarrose Shor, RN  07/12/16 1134

## 2016-07-12 NOTE — Discharge Instructions (Signed)
He tested positive for the flu.  Influenza is a virus that does not respond to antibiotics.  Symptomatic treatment with Tylenol or Motrin for fever or pain if needed.  You have also been prescribed a cough medication.  Do not take the cough medication to drive, work or upper machineries it may cause dizziness or drowsiness.

## 2016-07-12 NOTE — ED Provider Notes (Signed)
[facility] ED  eMERGENCY dEPARTMENT eNCOUnter      Pt Name: Carolyn Crane  MRN: [id number]  Birthdate Mar 21, 1976  Date of evaluation: 07/12/2016  Provider: Pecola Lawless, NP    CHIEF COMPLAINT       Chief Complaint   Patient presents with   ??? Cough     past 4 days(tx at [facility] ED 2 days ago) / recurrent from 3/5.Marland Kitchentx here   ??? Pharyngitis     w/ body aches today         HISTORY OF PRESENT ILLNESS  (Location/Symptom, Timing/Onset, Context/Setting, Quality, Duration, Modifying Factors, Severity.)   Carolyn Crane is a 41 y.o. female who presents to the emergency department via private auto with cough, generalized body aches and fevers.  Symptoms started 4 days ago with a nonproductive cough.  Today she developed sore throat and generalized body aches.  She is diabetic.  No history of COPD.  She is a nonsmoker.  She is to use over-the-counter cold medications with little improvement.    Nursing Notes were reviewed.    ALLERGIES     Patient has no known allergies.    CURRENT MEDICATIONS       Discharge Medication List as of 07/12/2016 12:40 PM      CONTINUE these medications which have NOT CHANGED    Details   Insulin Degludec (TRESIBA FLEXTOUCH) 100 UNIT/ML SOPN Inject 75 Units into the skin every morning (before breakfast)Historical Med      azithromycin (ZITHROMAX) 250 MG tablet Take 2 tablets (500 mg) on Day 1, followed by 1 tablet (250 mg) once daily on Days 2 through 5., Disp-1 packet, R-0Print      benzonatate (TESSALON PERLES) 100 MG capsule Take 1 capsule by mouth every 8 hours as needed for Cough, Disp-20 capsule, R-0Print      albuterol sulfate HFA (PROVENTIL HFA) 108 (90 Base) MCG/ACT inhaler Inhale 2 puffs into the lungs every 4 hours as needed for Wheezing or Shortness of Breath (Space out to every 6 hours as symptoms improve) Space out to every 6 hours as symptoms improve., Disp-1 Inhaler, R-0Print      citalopram (CELEXA) 20 MG tablet Take 20 mg by mouth dailyHistorical Med      ARIPiprazole (ABILIFY) 10  MG tablet Take 10 mg by mouth dailyHistorical Med      Insulin Glargine (BASAGLAR KWIKPEN SC) Inject 80 Units into the skin onceHistorical Med      Omega 3 1000 MG CAPS Take by mouthHistorical Med      canagliflozin (INVOKANA) 300 MG TABS tablet Take 300 mg by mouth every morning (before breakfast)      ibuprofen (ADVIL;MOTRIN) 800 MG tablet Take 1 tablet by mouth every 8 hours as needed for Pain or Fever, Disp-15 tablet, R-0      metFORMIN (GLUCOPHAGE) 1000 MG tablet Take 1,000 mg by mouth 2 times daily (with meals).      ALPRAZolam (XANAX) 1 MG tablet Take 1 mg by mouth as needed for Sleep or Anxiety.             PAST MEDICAL HISTORY         Diagnosis Date   ??? Bipolar 1 disorder (HCC)    ??? Depression    ??? Diabetes mellitus (HCC)    ??? Hyperlipidemia    ??? Hypertension        SURGICAL HISTORY           Procedure Laterality Date   ???  APPENDECTOMY     ??? CHOLECYSTECTOMY     ??? TONSILLECTOMY           FAMILY HISTORY     History reviewed. No pertinent family history.  No family status information on file.        SOCIAL HISTORY      reports that she has never smoked. She has never used smokeless tobacco. She reports that she drinks alcohol. She reports that she does not use drugs.    REVIEW OF SYSTEMS    (2-9 systems for level 4, 10 or more for level 5)     Review of Systems   Constitutional: Positive for fever. Negative for activity change and fatigue.   HENT: Positive for sore throat. Negative for congestion, sinus pressure and trouble swallowing.    Respiratory: Positive for cough. Negative for shortness of breath.    Cardiovascular: Negative for chest pain and palpitations.   Gastrointestinal: Negative for abdominal pain, constipation, diarrhea, nausea and vomiting.   Genitourinary: Negative for dysuria and frequency.   Musculoskeletal: Positive for myalgias. Negative for arthralgias.   Neurological: Negative for dizziness and light-headedness.        Except as noted above the remainder of the review of systems was  reviewed and negative.     PHYSICAL EXAM    (up to 7 for level 4, 8 or more for level 5)     ED Triage Vitals [07/12/16 1121]   BP Temp Temp Source Pulse Resp SpO2 Height Weight   114/81 100.4 ??F (38 ??C) Oral 114 16 97 % 5\' 6"  (1.676 m) 195 lb (88.5 kg)       Physical Exam   Constitutional: She is oriented to person, place, and time. She appears well-developed and well-nourished.   HENT:   Head: Normocephalic and atraumatic.   Mouth/Throat: Oropharynx is clear and moist.   Neck: Neck supple.   Cardiovascular: Normal rate.    Pulmonary/Chest: Breath sounds normal. No respiratory distress.   Lymphadenopathy:     She has no cervical adenopathy.   Neurological: She is alert and oriented to person, place, and time.   Skin: Skin is warm and dry.         DIAGNOSTIC RESULTS     EKG: All EKG's are interpreted by the Emergency Department Physician who either signs or Co-signs this chart in the absence of a cardiologist.      RADIOLOGY:   Non-plain film images such as CT, Ultrasound and MRI are read by the radiologist. Plain radiographic images are visualized and preliminarily interpreted by the emergency physician with the below findings:      Interpretation per the Radiologist below, if available at the time of this note:    XR CHEST STANDARD (2 VW)   Final Result   No acute abnormality.             LABS:  Labs Reviewed   RAPID INFLUENZA A/B ANTIGENS - Abnormal; Notable for the following:        Result Value    Direct Exam POSITIVE for Influenza A Antigen (*)     All other components within normal limits       All other labs were within normal range or not returned as of this dictation.    EMERGENCY DEPARTMENT COURSE and DIFFERENTIAL DIAGNOSIS/MDM:   Vitals:    Vitals:    07/12/16 1121 07/12/16 1251   BP: 114/81 111/71   Pulse: 114 113   Resp: 16 16  Temp: 100.4 ??F (38 ??C) 97.8 ??F (36.6 ??C)   TempSrc: Oral Oral   SpO2: 97%    Weight: 195 lb (88.5 kg)    Height: 5\' 6"  (1.676 m)        Medical Decision Making: 41 year old  female stable on arrival but does not appear toxic.  Chest x-ray does not have any evidence of pneumonia.  She is positive for influenza A.  She is outside of the treatment window for Tamiflu.  She was given verbal and written instructions regarding symptomatic treatment.  She received a prescription for Robitussin with codeine for her cough and will use Tylenol or Motrin for pain and fever if needed.      FINAL IMPRESSION      1. Influenza A          DISPOSITION/PLAN   DISPOSITION Decision To Discharge 07/12/2016 12:38:03 PM      PATIENT REFERRED TO:   Richarda Blade, CNP  [address] [place] [address]  [phone]    In 1 week      [facility] ED  [address] [place] [address]  [phone]    If symptoms worsen      DISCHARGE MEDICATIONS:     Discharge Medication List as of 07/12/2016 12:40 PM      START taking these medications    Details   guaiFENesin-codeine (TUSSI-ORGANIDIN NR) 100-10 MG/5ML syrup Take 5 mLs by mouth 3 times daily as needed for Cough for up to 7 days., Disp-150 mL, R-0Print             (Please note that portions of this note were completed with a voice recognition program.  Efforts were made to edit the dictations but occasionally words are mis-transcribed.)    Marcine Gadway A Slayter Moorhouse, NP             Pecola Lawless, NP  07/12/16 2253

## 2016-07-12 NOTE — ED Notes (Signed)
Urine sample obtained/ negative HCG     Duane Boston, LPN  96/04/54 0981

## 2016-07-13 LAB — POCT URINE PREGNANCY: HCG, Pregnancy Urine (POC): NEGATIVE

## 2016-08-03 ENCOUNTER — Emergency Department: Admit: 2016-08-04 | Payer: PRIVATE HEALTH INSURANCE | Primary: Nurse Practitioner

## 2016-08-03 DIAGNOSIS — R109 Unspecified abdominal pain: Secondary | ICD-10-CM

## 2016-08-03 NOTE — ED Provider Notes (Signed)
[facility] ED  eMERGENCY dEPARTMENT eNCOUnter      Pt Name: Carolyn Crane  MRN: [id number]  Birthdate [birth year]  Date of evaluation: 08/03/2016  Provider: Fabian Sharp, MD    CHIEF COMPLAINT       Chief Complaint   Patient presents with   ??? Rectal Bleeding     bright red rectal bleeding in diarrhea stools today(constipated past week)     Patient is seen in conjunction with mid-level provider.      DIAGNOSTIC RESULTS         RADIOLOGY:   Non-plain film images such as CT, Ultrasound and MRI are read by the radiologist. Plain radiographic images are visualized and preliminarily interpreted by the emergency physician with the below findings:  CT ABDOMEN PELVIS W IV CONTRAST Additional Contrast? None   Status: Final result   Order Providers     Authorizing Billing   Carolyn Stare, CNP Carolyn Stare, CNP      ??   Signed by     Signed Date/Time  Phone Pager   Cristy Hilts A 08/04/2016 02:20 [phone]    Reading Radiologists     Read Date Phone Pager   Denna Haggard Aug 04, 2016 [phone]    Radiation Dose Estimates     No radiation information found for this patient   Narrative   EXAMINATION:   CT OF THE ABDOMEN AND PELVIS WITH CONTRAST 08/04/2016 1:58 am   ??   TECHNIQUE:   CT of the abdomen and pelvis was performed with the administration of   intravenous contrast. Multiplanar reformatted images are provided for review.   Dose modulation, iterative reconstruction, and/or weight based adjustment of   the mA/kV was utilized to reduce the radiation dose to as low as reasonably   achievable.   ??   COMPARISON:   07/21/2012   ??   HISTORY:   ORDERING SYSTEM PROVIDED HISTORY: ??Abdominal pain   TECHNOLOGIST PROVIDED HISTORY:   Additional Contrast?->None   ??   FINDINGS:   Lower Chest: ??Visualized portion of the lower chest demonstrates no acute   abnormality.   ??   Organs: There has been cholecystectomy. ??Enhanced parenchymal attenuation   pattern of the liver, spleen, pancreas, adrenal glands is  unremarkable. ??The   kidneys appear normal in size and demonstrate symmetric enhancement. ??No   focal renal mass. ??No hydronephrosis. No perinephric stranding.   ??   GI/Bowel: No bowel dilatation to suggest obstruction. ??No evidence of acute   appendicitis.   ??   Pelvis: The urinary bladder appears unremarkable. ??The pelvic organs   demonstrate no acute abnormality.   ??   Peritoneum/Retroperitoneum: The abdominal aorta is normal in caliber. ??There   is no lymphadenopathy, fluid collection, or free air.   ??   Bones/Soft Tissues: No acute findings.   ??   ??   Impression   No acute findings.   ??         Interpretation per the Radiologist below, if available at the time of this note:    CT ABDOMEN PELVIS W IV CONTRAST Additional Contrast? None   Final Result   No acute findings.         XR Acute Abd Series Chest 1 VW   Final Result   No acute process in the chest.      No bowel obstruction or free air.      Colonic ileus  LABS:  Labs Reviewed   BASIC METABOLIC PANEL - Abnormal; Notable for the following:        Result Value    Glucose 283 (*)     BUN 23 (*)     Bun/Cre Ratio 28 (*)     Sodium 134 (*)     Chloride 96 (*)     All other components within normal limits   POCT URINE PREGNANCY - Normal   CBC WITH AUTO DIFFERENTIAL   OCCULT BLOOD SCREEN   LACTIC ACID     Results for TURA, ROLLER (MRN [id number]) as of 08/04/2016 02:29   Ref. Range 08/03/2016 22:21 08/03/2016 22:32 08/03/2016 22:34 08/03/2016 23:19 08/04/2016 00:55 08/04/2016 01:51   Sodium Latest Ref Range: 135 - 144 mmol/L   134 (L)      Potassium Latest Ref Range: 3.7 - 5.3 mmol/L   4.0      Chloride Latest Ref Range: 98 - 107 mmol/L   96 (L)      CO2 Latest Ref Range: 20 - 31 mmol/L   24      BUN Latest Ref Range: 6 - 20 mg/dL   23 (H)      Creatinine Latest Ref Range: 0.50 - 0.90 mg/dL   4.78      Bun/Cre Ratio Latest Ref Range: 9 - 20    28 (H)      Anion Gap Latest Ref Range: 9 - 17 mmol/L   14      GFR Non-African American Latest Ref Range: >60  mL/min   >60      GFR African American Latest Ref Range: >60 mL/min   >60      Lactic Acid Latest Ref Range: 0.5 - 2.2 mmol/L    2.0     Glucose Latest Ref Range: 70 - 99 mg/dL   295 (H)      Calcium Latest Ref Range: 8.6 - 10.4 mg/dL   9.2      GFR Comment Unknown   Pend      GFR Staging Unknown   NOT REPORTED      QC OK? Unknown     ok    WBC Latest Ref Range: 3.5 - 11.0 k/uL   9.3      RBC Latest Ref Range: 4.0 - 5.2 m/uL   4.08      Hemoglobin Quant Latest Ref Range: 12.0 - 16.0 g/dL   62.1      Hematocrit Latest Ref Range: 36 - 46 %   39.5      MCV Latest Ref Range: 80 - 100 fL   96.8      MCH Latest Ref Range: 26 - 34 pg   32.0      MCHC Latest Ref Range: 31 - 37 g/dL   30.8      MPV Latest Ref Range: 6.0 - 12.0 fL   7.7      RDW Latest Ref Range: 11.5 - 14.5 %   12.3      Platelet Count Latest Ref Range: 130 - 400 k/uL   340      Platelet Estimate Unknown   NOT REPORTED      Absolute Mono # Latest Ref Range: 0.2 - 0.8 k/uL   0.70      Eosinophils % Latest Ref Range: 1 - 4 %   1      Basophils # Latest Ref Range: 0.0 - 0.2 k/uL   0.00  Differential Type Unknown   NOT REPORTED      Seg Neutrophils Latest Ref Range: 36 - 66 %   63      Segs Absolute Latest Ref Range: 1.8 - 7.7 k/uL   5.80      Lymphocytes Latest Ref Range: 24 - 44 %   29      Absolute Lymph # Latest Ref Range: 1.0 - 4.8 k/uL   2.70      Monocytes Latest Ref Range: 1 - 7 %   7      Absolute Eos # Latest Ref Range: 0.0 - 0.4 k/uL   0.10      Basophils Latest Ref Range: 0 - 2 %   0      Immature Granulocytes Latest Ref Range: 0 %   NOT REPORTED      WBC Morphology Unknown   NOT REPORTED      RBC Morphology Unknown   NOT REPORTED      Date, Stool #1 Unknown 1,610,[id number]        Date, Stool #2 Unknown NOT REPORTED        Date, Stool #3 Unknown NOT REPORTED        Occult Blood, Stool #1 Latest Ref Range: NEG  NEGATIVE        Occult Blood, Stool #2 Latest Ref Range: NEG  NOT REPORTED        Occult Blood, Stool #3 Latest Ref Range: NEG  NOT REPORTED         Time, Stool #1 Unknown 2,221        Time, Stool #2 Unknown NOT REPORTED        Time, Stool #3 Unknown NOT REPORTED        Pregnancy, Urine Unknown     negative      All other labs were within normal range or not returned as of this dictation.    EMERGENCY DEPARTMENT COURSE and DIFFERENTIAL DIAGNOSIS/MDM:   Vitals:    Vitals:    08/03/16 2028   BP: 122/81   Pulse: 97   Resp: 16   Temp: 98.4 ??F (36.9 ??C)   TempSrc: Oral   SpO2: 100%   Weight: 196 lb 4 oz (89 kg)   Height:  (1.676 m)     Patient is occult negative on rectal exam.  Laboratory studies are otherwise without abnormality.  CT is without abnormality.  Patient presents with nonspecific abdominal pain.  No indication for further emergent investigations at this time.  Patient will be discharged home on continued symptomatic management.  She is advised follow-up in the outpatient setting.    CONSULTS:  None    PROCEDURES:  None    FINAL IMPRESSION      1. Nonspecific abdominal pain          DISPOSITION/PLAN   DISPOSITION Decision To Discharge 08/04/2016 02:30:48 AM      PATIENT REFERRED TO:   Richarda Blade, CNP  [address] [place] [address]  [phone]    Call today      [facility] ED  [address] [place] [address]  [phone]    As needed, If symptoms worsen      DISCHARGE MEDICATIONS:     New Prescriptions    DICYCLOMINE (BENTYL) 10 MG CAPSULE    Take 1 capsule by mouth every 6 hours as needed (cramps)    ONDANSETRON (ZOFRAN ODT) 4 MG DISINTEGRATING TABLET  Take 1 tablet by mouth every 8 hours as needed for Nausea         (Please note that portions of this note were completed with a voice recognition program.  Efforts were made to edit the dictations but occasionally words are mis-transcribed.)    Fabian Sharp, MD  Attending Emergency Physician         Fabian Sharp, MD  08/04/16 [phone]

## 2016-08-03 NOTE — ED Provider Notes (Signed)
[facility]  [address] [facility] ED  eMERGENCY dEPARTMENT eNCOUnter      Pt Name: Carolyn Crane  MRN: [id number]  Birthdate [birth year]  Date of evaluation: 08/03/2016  Provider: Carolyn Stare, CNP    CHIEF COMPLAINT       Chief Complaint   Patient presents with   ??? Rectal Bleeding     bright red rectal bleeding in diarrhea stools today(constipated past week)         HISTORY OF PRESENT ILLNESS  (Location/Symptom, Timing/Onset, Context/Setting, Quality, Duration, Modifying Factors, Severity.)   Carolyn Crane is a 41 y.o. female who presents to the emergency department via private auto for bright red blood with a bowel movement this morning. States there were two episodes. She had a BM while in the waiting room and did not notice and blood. Reports low abdominal discomfort. States prior to today she was constipated for 1.5 weeks. She took Ex-lax today. She had the blood BM 5 minutes after taking the medication. States she did not pass a hard stool. Reports discomfort to her rectum. Rates her pain 3/10 at this time. Denies injury, fever, chills, N/V.       Nursing Notes were reviewed.    ALLERGIES     Patient has no known allergies.    CURRENT MEDICATIONS       Discharge Medication List as of 08/04/2016  2:32 AM      CONTINUE these medications which have NOT CHANGED    Details   Liraglutide (VICTOZA SC) Inject into the skin 0.6Historical Med      Insulin Degludec (TRESIBA FLEXTOUCH) 100 UNIT/ML SOPN Inject 75 Units into the skin every morning (before breakfast)Historical Med      azithromycin (ZITHROMAX) 250 MG tablet Take 2 tablets (500 mg) on Day 1, followed by 1 tablet (250 mg) once daily on Days 2 through 5., Disp-1 packet, R-0Print      benzonatate (TESSALON PERLES) 100 MG capsule Take 1 capsule by mouth every 8 hours as needed for Cough, Disp-20 capsule, R-0Print      albuterol sulfate HFA (PROVENTIL HFA) 108 (90 Base) MCG/ACT inhaler Inhale 2 puffs into the lungs every 4 hours as needed for Wheezing or Shortness of  Breath (Space out to every 6 hours as symptoms improve) Space out to every 6 hours as symptoms improve., Disp-1 Inhaler, R-0Print      citalopram (CELEXA) 20 MG tablet Take 20 mg by mouth dailyHistorical Med      ARIPiprazole (ABILIFY) 10 MG tablet Take 10 mg by mouth dailyHistorical Med      Insulin Glargine (BASAGLAR KWIKPEN SC) Inject 80 Units into the skin onceHistorical Med      Omega 3 1000 MG CAPS Take by mouthHistorical Med      canagliflozin (INVOKANA) 300 MG TABS tablet Take 300 mg by mouth every morning (before breakfast)      ibuprofen (ADVIL;MOTRIN) 800 MG tablet Take 1 tablet by mouth every 8 hours as needed for Pain or Fever, Disp-15 tablet, R-0      metFORMIN (GLUCOPHAGE) 1000 MG tablet Take 1,000 mg by mouth 2 times daily (with meals).      ALPRAZolam (XANAX) 1 MG tablet Take 1 mg by mouth as needed for Sleep or Anxiety.             PAST MEDICAL HISTORY         Diagnosis Date   ??? Bipolar 1 disorder (HCC)    ??? Depression    ???  Diabetes mellitus (HCC)    ??? Hyperlipidemia    ??? Hypertension        SURGICAL HISTORY           Procedure Laterality Date   ??? APPENDECTOMY     ??? CHOLECYSTECTOMY     ??? TONSILLECTOMY           FAMILY HISTORY     History reviewed. No pertinent family history.  No family status information on file.        SOCIAL HISTORY      reports that she has never smoked. She has never used smokeless tobacco. She reports that she drinks alcohol. She reports that she does not use drugs.    REVIEW OF SYSTEMS    (2-9 systems for level 4, 10 or more for level 5)     Review of Systems   Constitutional: Negative for chills, diaphoresis, fatigue and fever.   Respiratory: Negative for cough and shortness of breath.    Cardiovascular: Negative for chest pain.   Gastrointestinal: Positive for abdominal pain, blood in stool, constipation and rectal pain. Negative for diarrhea, nausea and vomiting.   Genitourinary: Negative for dysuria, flank pain, frequency, hematuria and urgency.   Musculoskeletal:  Negative for back pain.   Skin: Negative for color change, rash and wound.   Neurological: Negative for dizziness, weakness, light-headedness and headaches.     Except as noted above the remainder of the review of systems was reviewed and negative.     PHYSICAL EXAM    (up to 7 for level 4, 8 or more for level 5)     ED Triage Vitals [08/03/16 2028]   BP Temp Temp Source Pulse Resp SpO2 Height Weight   122/81 98.4 ??F (36.9 ??C) Oral 97 16 100 %  (1.676 m) 196 lb 4 oz (89 kg)     Physical Exam   Constitutional: She is oriented to person, place, and time. She appears well-developed and well-nourished. No distress.   HENT:   Mouth/Throat: Oropharynx is clear and moist.   Eyes: Conjunctivae are normal.   Cardiovascular: Normal rate, regular rhythm and normal heart sounds.    Pulmonary/Chest: Effort normal and breath sounds normal. No respiratory distress. She has no wheezes. She has no rales.   Abdominal: Soft. Bowel sounds are normal. She exhibits no distension. There is no tenderness. There is no rebound and no guarding.   Genitourinary: Rectal exam shows tenderness. Rectal exam shows no external hemorrhoid, no mass and anal tone normal.   Neurological: She is alert and oriented to person, place, and time.   Skin: Skin is warm and dry. No rash noted. She is not diaphoretic.   Psychiatric: She has a normal mood and affect. Her behavior is normal.   Vitals reviewed.      DIAGNOSTIC RESULTS     RADIOLOGY:   Non-plain film images such as CT, Ultrasound and MRI are read by the radiologist. Plain radiographic images are visualized and preliminarily interpreted by the emergency physician with the below findings:    Interpretation per the Radiologist below, if available at the time of this note:    Xr Acute Abd Series Chest 1 Vw    Result Date: 08/03/2016  EXAMINATION: ACUTE ABDOMINAL SERIES WITH SINGLE  VIEW OF THE CHEST 08/03/2016 10:21 pm COMPARISON: None. HISTORY: ORDERING SYSTEM PROVIDED HISTORY: pain, constipation  TECHNOLOGIST PROVIDED HISTORY: Reason for exam:->pain, constipation Ordering Physician Provided Reason for Exam: abdomen pain x 1 wk Acuity: Acute Type  of Exam: Initial Additional signs and symptoms: constipation, rectal bleeding FINDINGS: Lungs are clear without acute process. No pleural effusion or pneumothorax. No focal consolidation or edema. Cardiomediastinal silhouette and bony thorax are without acute abnormality.  A few air-fluid levels are noted throughout the colon.  Surgical clips right upper quadrant. No evidence of intraperitoneal free air. Nonspecific bowel gas pattern without evidence of obstruction. No abnormal calcifications. Osseous structures are intact.     No acute process in the chest. No bowel obstruction or free air. Colonic ileus     Ct Abdomen Pelvis W Iv Contrast Additional Contrast? None    Result Date: 08/04/2016  EXAMINATION: CT OF THE ABDOMEN AND PELVIS WITH CONTRAST 08/04/2016 1:58 am TECHNIQUE: CT of the abdomen and pelvis was performed with the administration of intravenous contrast. Multiplanar reformatted images are provided for review. Dose modulation, iterative reconstruction, and/or weight based adjustment of the mA/kV was utilized to reduce the radiation dose to as low as reasonably achievable. COMPARISON: 07/21/2012 HISTORY: ORDERING SYSTEM PROVIDED HISTORY:  Abdominal pain TECHNOLOGIST PROVIDED HISTORY: Additional Contrast?->None FINDINGS: Lower Chest:  Visualized portion of the lower chest demonstrates no acute abnormality. Organs: There has been cholecystectomy.  Enhanced parenchymal attenuation pattern of the liver, spleen, pancreas, adrenal glands is unremarkable.  The kidneys appear normal in size and demonstrate symmetric enhancement.  No focal renal mass.  No hydronephrosis. No perinephric stranding. GI/Bowel: No bowel dilatation to suggest obstruction.  No evidence of acute appendicitis. Pelvis: The urinary bladder appears unremarkable.  The pelvic organs demonstrate  no acute abnormality. Peritoneum/Retroperitoneum: The abdominal aorta is normal in caliber.  There is no lymphadenopathy, fluid collection, or free air. Bones/Soft Tissues: No acute findings.     No acute findings.     LABS:  Labs Reviewed   BASIC METABOLIC PANEL - Abnormal; Notable for the following:        Result Value    Glucose 283 (*)     BUN 23 (*)     Bun/Cre Ratio 28 (*)     Sodium 134 (*)     Chloride 96 (*)     All other components within normal limits   POCT URINE PREGNANCY - Normal   CBC WITH AUTO DIFFERENTIAL   OCCULT BLOOD SCREEN   LACTIC ACID   POCT URINE PREGNANCY       All other labs were within normal range or not returned as of this dictation.    EMERGENCY DEPARTMENT COURSE and DIFFERENTIAL DIAGNOSIS/MDM:   Vitals:    Vitals:    08/03/16 2028   BP: 122/81   Pulse: 97   Resp: 16   Temp: 98.4 ??F (36.9 ??C)   TempSrc: Oral   SpO2: 100%   Weight: 196 lb 4 oz (89 kg)   Height:  (1.676 m)       MEDICATIONS GIVEN IN THE ED:  Medications   0.9 % sodium chloride bolus (0 mLs Intravenous Stopped 08/04/16 0219)   iopamidol (ISOVUE-370) 76 % injection 125 mL (125 mLs Intravenous Given 08/04/16 0151)       CLINICAL DECISION MAKING:  The patient presented alert with a nontoxic appearance and was seen in conjunction with Dr. Misty Stanley. Laboratory studies were unremarkable. CT scan is pending. Care was resumed to Dr. Misty Stanley. See her dictation for further evaluation and treatment.      FINAL IMPRESSION      1. Nonspecific abdominal pain              DISPOSITION/PLAN  DISPOSITION Decision To Discharge 08/04/2016 02:30:48 AM      PATIENT REFERRED TO:   Richarda Blade, CNP  [address] [place] [address]  [phone]    Call today      [facility] ED  [address] [place] [address]  [phone]    As needed, If symptoms worsen      DISCHARGE MEDICATIONS:     Discharge Medication List as of 08/04/2016  2:32 AM      START taking these medications    Details   dicyclomine (BENTYL) 10 MG capsule Take 1  capsule by mouth every 6 hours as needed (cramps), Disp-20 capsule, R-0Print      ondansetron (ZOFRAN ODT) 4 MG disintegrating tablet Take 1 tablet by mouth every 8 hours as needed for Nausea, Disp-20 tablet, R-0Print                 (Please note that portions of this note were completed with a voice recognition program.  Efforts were made to edit the dictations but occasionally words are mis-transcribed.)    Carolyn Stare, CNP      Carolyn Stare, CNP  08/04/16 1327

## 2016-08-03 NOTE — ED Notes (Signed)
Patient c/o constipation for 1.5 weeks.  States today had a BM at 1200, 1500, 1530 all were hard and full of blood.  Patient states that she has IBS so normally has loose stools.  Called PCP, unable to get in to office so came to ER.  Patient rates pain in abdomen and rectal area a 4 on 0-10 scale.      Truman Hayward, RN  08/03/16 2224

## 2016-08-04 ENCOUNTER — Emergency Department: Admit: 2016-08-04 | Payer: PRIVATE HEALTH INSURANCE | Primary: Nurse Practitioner

## 2016-08-04 ENCOUNTER — Inpatient Hospital Stay
Admit: 2016-08-04 | Discharge: 2016-08-04 | Disposition: A | Discharge status: Home / Self Care | Payer: PRIVATE HEALTH INSURANCE | Attending: Emergency Medicine

## 2016-08-04 LAB — BASIC METABOLIC PANEL
Anion Gap: 14 mmol/L (ref 9–17)
BUN: 23 mg/dL — ABNORMAL HIGH (ref 6–20)
Bun/Cre Ratio: 28 — ABNORMAL HIGH (ref 9–20)
CO2: 24 mmol/L (ref 20–31)
Calcium: 9.2 mg/dL (ref 8.6–10.4)
Chloride: 96 mmol/L — ABNORMAL LOW (ref 98–107)
Creatinine: 0.83 mg/dL (ref 0.50–0.90)
GFR African American: >60 mL/min (ref >60)
GFR Non-African American: >60 mL/min (ref >60)
Glucose: 283 mg/dL — ABNORMAL HIGH (ref 70–99)
Potassium: 4 mmol/L (ref 3.7–5.3)
Sodium: 134 mmol/L — ABNORMAL LOW (ref 135–144)

## 2016-08-04 LAB — OCCULT BLOOD SCREEN
Date, Stool #1: 4092018
Occult Blood, Stool #1: NEGATIVE
Time, Stool #1: 2221

## 2016-08-04 LAB — CBC WITH AUTO DIFFERENTIAL
Absolute Eos #: 0.1 10*3/uL (ref 0.0–0.4)
Absolute Lymph #: 2.7 10*3/uL (ref 1.0–4.8)
Absolute Mono #: 0.7 10*3/uL (ref 0.2–0.8)
Basophils Absolute: 0 10*3/uL (ref 0.0–0.2)
Basophils: 0 % (ref 0–2)
Eosinophils %: 1 % (ref 1–4)
Hematocrit: 39.5 % (ref 36–46)
Hemoglobin: 13.1 g/dL (ref 12.0–16.0)
Lymphocytes: 29 % (ref 24–44)
MCH: 32 pg (ref 26–34)
MCHC: 33 g/dL (ref 31–37)
MCV: 96.8 fL (ref 80–100)
MPV: 7.7 fL (ref 6.0–12.0)
Monocytes: 7 % (ref 1–7)
Platelets: 340 10*3/uL (ref 130–400)
RBC: 4.08 m/uL (ref 4.0–5.2)
RDW: 12.3 % (ref 11.5–14.5)
Seg Neutrophils: 63 % (ref 36–66)
Segs Absolute: 5.8 10*3/uL (ref 1.8–7.7)
WBC: 9.3 10*3/uL (ref 3.5–11.0)

## 2016-08-04 LAB — POCT URINE PREGNANCY
HCG, Pregnancy Urine (POC): NEGATIVE
Preg Test, Ur: NEGATIVE

## 2016-08-04 LAB — LACTIC ACID: Lactic Acid: 2 mmol/L (ref 0.5–2.2)

## 2016-08-04 MED ORDER — IOPAMIDOL 76 % IV SOLN
76 % | Freq: Once | INTRAVENOUS | Status: AC | PRN
Start: 2016-08-04 — End: 2016-08-04
  Administered 2016-08-04: 06:00:00 125 mL via INTRAVENOUS

## 2016-08-04 MED ORDER — SODIUM CHLORIDE 0.9 % IV BOLUS
0.9 % | Freq: Once | INTRAVENOUS | Status: DC
Start: 2016-08-04 — End: 2016-08-04

## 2016-08-04 MED ORDER — SODIUM CHLORIDE 0.9 % IV BOLUS
0.9 % | Freq: Once | INTRAVENOUS | Status: AC
Start: 2016-08-04 — End: 2016-08-04
  Administered 2016-08-04: 04:00:00 1000 mL via INTRAVENOUS

## 2016-08-04 MED ORDER — NORMAL SALINE FLUSH 0.9 % IV SOLN
0.9 % | INTRAVENOUS | Status: DC | PRN
Start: 2016-08-04 — End: 2016-08-04
  Administered 2016-08-04: 06:00:00 10 mL via INTRAVENOUS

## 2016-08-04 MED ORDER — DICYCLOMINE HCL 10 MG PO CAPS
10 MG | ORAL_CAPSULE | Freq: Four times a day (QID) | ORAL | 0 refills | Status: DC | PRN
Start: 2016-08-04 — End: 2016-09-26

## 2016-08-04 MED ORDER — ONDANSETRON 4 MG PO TBDP
4 MG | ORAL_TABLET | Freq: Three times a day (TID) | ORAL | 0 refills | Status: DC | PRN
Start: 2016-08-04 — End: 2016-09-26

## 2016-08-17 ENCOUNTER — Inpatient Hospital Stay: Admit: 2016-08-17 | Discharge: 2016-08-17 | Discharge status: Against Medical Advice | Payer: PRIVATE HEALTH INSURANCE

## 2016-08-17 DIAGNOSIS — Z5329 Procedure and treatment not carried out because of patient's decision for other reasons: Secondary | ICD-10-CM

## 2016-08-17 LAB — EKG 12-LEAD
Atrial Rate: 72 {beats}/min
P Axis: -18 degrees
P-R Interval: 148 ms
Q-T Interval: 414 ms
QRS Duration: 100 ms
QTc Calculation (Bazett): 453 ms
R Axis: 83 degrees
T Axis: 20 degrees
Ventricular Rate: 72 {beats}/min

## 2016-09-15 DIAGNOSIS — R079 Chest pain, unspecified: Secondary | ICD-10-CM

## 2016-09-15 NOTE — ED Provider Notes (Signed)
[place] [facility] ED  eMERGENCY dEPARTMENT eNCOUnter      Pt Name: Carolyn Crane  MRN: [id number]  Birthdate 1975-09-30  Date of evaluation: 09/15/2016  Provider: Cherie Ouch, MD    CHIEF COMPLAINT       Chief Complaint   Patient presents with   . Chest Pain         HISTORY OF PRESENT ILLNESS  (Location/Symptom, Timing/Onset, Context/Setting, Quality, Duration, Modifying Factors, Severity.)   Carolyn Crane is a 41 y.o. female who presents to the emergency department For chest pain.  It's been continuous since 7 PM, about 5 hours ago.  She was at work not doing anything exertional when it started.  It feels like a pressure and tightness.  It waxes and wanes but never goes away.  Nothing seems to make it worse or better.  No fever cough or back pain.       Nursing Notes were reviewed.    ALLERGIES     Patient has no known allergies.    CURRENT MEDICATIONS       Previous Medications    ALBUTEROL SULFATE HFA (PROVENTIL HFA) 108 (90 BASE) MCG/ACT INHALER    Inhale 2 puffs into the lungs every 4 hours as needed for Wheezing or Shortness of Breath (Space out to every 6 hours as symptoms improve) Space out to every 6 hours as symptoms improve.    ALPRAZOLAM (XANAX) 1 MG TABLET    Take 1 mg by mouth as needed for Sleep or Anxiety.    ARIPIPRAZOLE (ABILIFY) 10 MG TABLET    Take 15 mg by mouth daily     ATORVASTATIN (LIPITOR) 80 MG TABLET    Take 80 mg by mouth    AZITHROMYCIN (ZITHROMAX) 250 MG TABLET    Take 2 tablets (500 mg) on Day 1, followed by 1 tablet (250 mg) once daily on Days 2 through 5.    BENZONATATE (TESSALON PERLES) 100 MG CAPSULE    Take 1 capsule by mouth every 8 hours as needed for Cough    CANAGLIFLOZIN (INVOKANA) 300 MG TABS TABLET    Take 300 mg by mouth every morning (before breakfast)    CITALOPRAM (CELEXA) 20 MG TABLET    Take 10 mg by mouth daily     DICYCLOMINE (BENTYL) 10 MG CAPSULE    Take 1 capsule by mouth every 6 hours as needed (cramps)    EZETIMIBE (ZETIA) 10 MG TABLET        FENOFIBRATE  160 MG TABLET    Take 160 mg by mouth    IBUPROFEN (ADVIL;MOTRIN) 800 MG TABLET    Take 1 tablet by mouth every 8 hours as needed for Pain or Fever    INSULIN DEGLUDEC (TRESIBA FLEXTOUCH) 100 UNIT/ML SOPN    Inject 25 Units into the skin every morning (before breakfast)     INSULIN GLARGINE (BASAGLAR KWIKPEN SC)    Inject 80 Units into the skin once    LIRAGLUTIDE (VICTOZA SC)    Inject into the skin 0.6    METFORMIN (GLUCOPHAGE) 1000 MG TABLET    Take 1,000 mg by mouth 2 times daily (with meals).    OMEGA 3 1000 MG CAPS    Take by mouth    ONDANSETRON (ZOFRAN ODT) 4 MG DISINTEGRATING TABLET    Take 1 tablet by mouth every 8 hours as needed for Nausea       PAST MEDICAL HISTORY         Diagnosis  Date   . Bipolar 1 disorder (HCC)    . Depression    . Diabetes mellitus (HCC)    . Hyperlipidemia    . Hypertension    . IBS (irritable bowel syndrome)        SURGICAL HISTORY           Procedure Laterality Date   . APPENDECTOMY     . CHOLECYSTECTOMY     . TONSILLECTOMY           FAMILY HISTORY     History reviewed. No pertinent family history.  No family status information on file.      Numerous family members had heart disease at young ages.    SOCIAL HISTORY      reports that she has never smoked. She has never used smokeless tobacco. She reports that she drinks alcohol. She reports that she does not use drugs.    REVIEW OF SYSTEMS    (2-9 systems for level 4, 10 or more for level 5)     Review of Systems   Constitutional: Negative for chills, fatigue and fever.   HENT: Negative for congestion, ear discharge and facial swelling.    Eyes: Negative for discharge and redness.   Respiratory: Negative for cough and shortness of breath.    Cardiovascular: Positive for chest pain.   Gastrointestinal: Negative for abdominal pain, constipation, diarrhea and vomiting.   Genitourinary: Negative for dysuria and hematuria.   Musculoskeletal: Negative for arthralgias.   Skin: Negative for color change and rash.   Neurological:  Negative for syncope, numbness and headaches.   Hematological: Negative for adenopathy.   Psychiatric/Behavioral: Negative for confusion. The patient is not nervous/anxious.         Except as noted above the remainder of the review of systems was reviewed and negative.     PHYSICAL EXAM    (up to 7 for level 4, 8 or more for level 5)     Vitals:    09/15/16 2329 09/16/16 0130 09/16/16 0200 09/16/16 0215   BP: 122/77 111/71 110/70 105/67   Pulse: 75 75 74 68   Resp: 16 (!) 0 13    Temp: 97.7 F (36.5 C)      TempSrc: Oral      SpO2: 100% 96% 95% 94%   Weight: 194 lb 9.6 oz (88.3 kg)      Height: 5\' 6"  (1.676 m)          Physical Exam   Constitutional: She is oriented to person, place, and time. She appears well-developed and well-nourished. No distress.   HENT:   Head: Normocephalic and atraumatic.   Eyes: Right eye exhibits no discharge. Left eye exhibits no discharge. No scleral icterus.   Neck: Neck supple.   Cardiovascular: Normal rate and regular rhythm.    Pulmonary/Chest: Effort normal and breath sounds normal. No stridor. No respiratory distress. She has no wheezes. She has no rales.   Abdominal: Soft. She exhibits no distension. There is no tenderness.   Musculoskeletal: Normal range of motion. She exhibits no edema.   Lymphadenopathy:     She has no cervical adenopathy.   Neurological: She is alert and oriented to person, place, and time.   Skin: Skin is warm and dry. No rash noted. She is not diaphoretic. No erythema.   Psychiatric: She has a normal mood and affect. Her behavior is normal.   Vitals reviewed.        DIAGNOSTIC RESULTS  EKG: All EKG's are interpreted by the Emergency Department Physician who either signs or Co-signs this chart in the absence of a cardiologist.    EKG on my interpretation shows normal sinus rhythm with a rate of 71.  No acute ST segment changes appear    RADIOLOGY:   Non-plain film images such as CT, Ultrasound and MRI are read by the radiologist. Plain radiographic  images are visualized and preliminarily interpreted by the emergency physician with the below findings:    Interpretation per the Radiologist below, if available at the time of this note:    Xr Chest Portable    Result Date: 09/16/2016  EXAMINATION: SINGLE VIEW OF THE CHEST 09/16/2016 12:07 am COMPARISON: July 12, 2016. HISTORY: ORDERING SYSTEM PROVIDED HISTORY: Chest Pain TECHNOLOGIST PROVIDED HISTORY: Reason for exam:->Chest Pain FINDINGS: The lungs are without acute focal process.  There is no effusion or pneumothorax. The cardiomediastinal silhouette is stable. The osseous structures are stable.     No acute process.     LABS:  Labs Reviewed   BASIC METABOLIC PANEL - Abnormal; Notable for the following:        Result Value    Glucose 166 (*)     Chloride 96 (*)     All other components within normal limits   PROTIME-INR - Abnormal; Notable for the following:     Protime 9.4 (*)     All other components within normal limits   TROP/MYOGLOBIN - Abnormal; Notable for the following:     Myoglobin 73 (*)     All other components within normal limits   CBC WITH AUTO DIFFERENTIAL   APTT   TROP/MYOGLOBIN   TROP/MYOGLOBIN   TROP/MYOGLOBIN       All other labs were within normal range or not returned as of this dictation.    EMERGENCY DEPARTMENT COURSE and DIFFERENTIAL DIAGNOSIS/MDM:   Vitals:    Vitals:    09/15/16 2329 09/16/16 0130 09/16/16 0200 09/16/16 0215   BP: 122/77 111/71 110/70 105/67   Pulse: 75 75 74 68   Resp: 16 (!) 0 13    Temp: 97.7 F (36.5 C)      TempSrc: Oral      SpO2: 100% 96% 95% 94%   Weight: 194 lb 9.6 oz (88.3 kg)      Height: 5\' 6"  (1.676 m)          Orders Placed This Encounter   Medications   . aspirin chewable tablet 324 mg   . nitroGLYCERIN (NITROSTAT) SL tablet 0.4 mg       Medical Decision Making: The patient has risk factors for heart disease including diabetes and very strong family history.  She was advised admission to hospital but does not want to stay.  She states she cannot miss  work.  She was advised of the risks of leaving against medical advice and continues to want to leave.  She is fully able to make medical decisions for herself.  She will call her doctor today to schedule follow-up and was advised that she can return to the emergency department at any time if her symptoms worsen or if she changes her mind.     CONSULTS:  None    PROCEDURES:  None    FINAL IMPRESSION      1. Chest pain, unspecified type          DISPOSITION/PLAN   DISPOSITION Ama 09/16/2016 03:14:46 AM      PATIENT REFERRED TO:  Richarda Blade, APRN - CNP  [address] [place] [address]  [phone]    Call today      [facility] ED  [address] [place] [address]  [phone]    If symptoms worsen      DISCHARGE MEDICATIONS:     New Prescriptions    No medications on file         (Please note that portions of this note were completed with a voice recognition program.  Efforts were made to edit the dictations but occasionally words are mis-transcribed.)    Cherie Ouch, MD  Attending Emergency Physician             Cherie Ouch, MD  09/16/16 [phone]

## 2016-09-16 ENCOUNTER — Emergency Department: Admit: 2016-09-16 | Payer: PRIVATE HEALTH INSURANCE | Primary: Nurse Practitioner

## 2016-09-16 ENCOUNTER — Inpatient Hospital Stay
Admit: 2016-09-16 | Discharge: 2016-09-16 | Discharge status: Against Medical Advice | Payer: PRIVATE HEALTH INSURANCE | Attending: Emergency Medicine

## 2016-09-16 LAB — EKG 12-LEAD
Atrial Rate: 71 {beats}/min
P Axis: -18 degrees
P-R Interval: 142 ms
Q-T Interval: 416 ms
QRS Duration: 90 ms
QTc Calculation (Bazett): 452 ms
R Axis: 72 degrees
T Axis: 60 degrees
Ventricular Rate: 71 {beats}/min

## 2016-09-16 LAB — CBC WITH AUTO DIFFERENTIAL
Absolute Eos #: 0.1 10*3/uL (ref 0.0–0.4)
Absolute Lymph #: 3.9 10*3/uL (ref 1.0–4.8)
Absolute Mono #: 0.6 10*3/uL (ref 0.2–0.8)
Basophils Absolute: 0.1 10*3/uL (ref 0.0–0.2)
Basophils: 1 % (ref 0–2)
Eosinophils %: 1 % (ref 1–4)
Hematocrit: 39.5 % (ref 36–46)
Hemoglobin: 13.2 g/dL (ref 12.0–16.0)
Lymphocytes: 37 % (ref 24–44)
MCH: 31.7 pg (ref 26–34)
MCHC: 33.4 g/dL (ref 31–37)
MCV: 94.7 fL (ref 80–100)
MPV: 8.3 fL (ref 6.0–12.0)
Monocytes: 6 % (ref 1–7)
Platelets: 329 10*3/uL (ref 130–400)
RBC: 4.17 m/uL (ref 4.0–5.2)
RDW: 12.3 % (ref 11.5–14.5)
Seg Neutrophils: 55 % (ref 36–66)
Segs Absolute: 5.8 10*3/uL (ref 1.8–7.7)
WBC: 10.5 10*3/uL (ref 3.5–11.0)

## 2016-09-16 LAB — PROTIME-INR
INR: 0.9
Protime: 9.4 s — ABNORMAL LOW (ref 9.7–11.6)

## 2016-09-16 LAB — BASIC METABOLIC PANEL
Anion Gap: 14 mmol/L (ref 9–17)
BUN: 11 mg/dL (ref 6–20)
Bun/Cre Ratio: 16 (ref 9–20)
CO2: 26 mmol/L (ref 20–31)
Calcium: 9.2 mg/dL (ref 8.6–10.4)
Chloride: 96 mmol/L — ABNORMAL LOW (ref 98–107)
Creatinine: 0.7 mg/dL (ref 0.50–0.90)
GFR African American: >60 mL/min (ref >60)
GFR Non-African American: >60 mL/min (ref >60)
Glucose: 166 mg/dL — ABNORMAL HIGH (ref 70–99)
Potassium: 3.7 mmol/L (ref 3.7–5.3)
Sodium: 136 mmol/L (ref 135–144)

## 2016-09-16 LAB — TROP/MYOGLOBIN
Myoglobin: 73 ng/mL — ABNORMAL HIGH (ref 25–58)
Troponin T: <0.03 ng/mL (ref <0.03)

## 2016-09-16 LAB — APTT: PTT: 26 s (ref 23–31)

## 2016-09-16 MED ORDER — ASPIRIN 81 MG PO CHEW
81 MG | Freq: Once | ORAL | Status: AC
Start: 2016-09-16 — End: 2016-09-16
  Administered 2016-09-16: 04:00:00 324 mg via ORAL

## 2016-09-16 MED ORDER — NITROGLYCERIN 0.4 MG SL SUBL
0.4 MG | SUBLINGUAL | Status: DC | PRN
Start: 2016-09-16 — End: 2016-09-16

## 2016-09-16 MED FILL — ASPIRIN LOW STRENGTH 81 MG PO CHEW: 81 MG | ORAL | Qty: 4

## 2016-09-16 NOTE — ED Notes (Signed)
x2 IV failed attempts.      Olga CoasterKylie K Micajah Dennin, RN  09/16/16 [phone]

## 2016-09-26 ENCOUNTER — Emergency Department: Admit: 2016-09-26 | Payer: PRIVATE HEALTH INSURANCE | Primary: Nurse Practitioner

## 2016-09-26 ENCOUNTER — Inpatient Hospital Stay
Admission: EM | Admit: 2016-09-26 | Discharge: 2016-09-27 | Disposition: A | Discharge status: Home / Self Care | Payer: PRIVATE HEALTH INSURANCE | Source: Other Acute Inpatient Hospital | Admitting: Emergency Medicine

## 2016-09-26 DIAGNOSIS — R0789 Other chest pain: Principal | ICD-10-CM

## 2016-09-26 LAB — MICROSCOPIC URINALYSIS
RBC, UA: 0 /HPF (ref 0–2)
WBC, UA: 0 /HPF (ref 0–5)

## 2016-09-26 LAB — CBC WITH AUTO DIFFERENTIAL
Absolute Eos #: 0.1 10*3/uL (ref 0.0–0.4)
Absolute Lymph #: 1.7 10*3/uL (ref 1.0–4.8)
Absolute Mono #: 0.5 10*3/uL (ref 0.2–0.8)
Basophils Absolute: 0 10*3/uL (ref 0.0–0.2)
Basophils: 1 % (ref 0–2)
Eosinophils %: 1 % (ref 1–4)
Hematocrit: 41 % (ref 36–46)
Hemoglobin: 13.9 g/dL (ref 12.0–16.0)
Lymphocytes: 21 % — ABNORMAL LOW (ref 24–44)
MCH: 31.5 pg (ref 26–34)
MCHC: 33.8 g/dL (ref 31–37)
MCV: 93.3 fL (ref 80–100)
Monocytes: 6 % (ref 1–7)
Platelets: 356 10*3/uL (ref 130–400)
RBC: 4.4 m/uL (ref 4.0–5.2)
RDW: 11.3 % — ABNORMAL LOW (ref 11.5–14.5)
Seg Neutrophils: 71 % — ABNORMAL HIGH (ref 36–66)
Segs Absolute: 6 10*3/uL (ref 1.8–7.7)
WBC: 8.3 10*3/uL (ref 3.5–11.0)

## 2016-09-26 LAB — EKG 12-LEAD
Atrial Rate: 93 {beats}/min
P Axis: 70 degrees
P-R Interval: 130 ms
Q-T Interval: 362 ms
QRS Duration: 80 ms
QTc Calculation (Bazett): 450 ms
R Axis: 63 degrees
T Axis: 47 degrees
Ventricular Rate: 93 {beats}/min

## 2016-09-26 LAB — URINALYSIS WITH REFLEX TO CULTURE
Bilirubin Urine: NEGATIVE
Ketones, Urine: NEGATIVE
Leukocyte Esterase, Urine: NEGATIVE
Nitrite, Urine: NEGATIVE
Protein, UA: NEGATIVE
Specific Gravity, UA: 1.025 (ref 1.005–1.030)
Urine Hgb: NEGATIVE
Urobilinogen, Urine: NORMAL
pH, UA: 6 (ref 5.0–8.0)

## 2016-09-26 LAB — BASIC METABOLIC PANEL
Anion Gap: 14 mmol/L (ref 9–17)
BUN: 15 mg/dL (ref 6–20)
Bun/Cre Ratio: 24 — ABNORMAL HIGH (ref 9–20)
CO2: 24 mmol/L (ref 20–31)
Calcium: 9 mg/dL (ref 8.6–10.4)
Chloride: 99 mmol/L (ref 98–107)
Creatinine: 0.62 mg/dL (ref 0.50–0.90)
GFR African American: >60 mL/min (ref >60)
GFR Non-African American: >60 mL/min (ref >60)
Glucose: 248 mg/dL — ABNORMAL HIGH (ref 70–99)
Potassium: 4.2 mmol/L (ref 3.7–5.3)
Sodium: 137 mmol/L (ref 135–144)

## 2016-09-26 LAB — PROTIME-INR
INR: 0.9
Protime: 9.7 s (ref 9.7–11.6)

## 2016-09-26 LAB — LIPID PANEL
Chol/HDL Ratio: 7.6 — ABNORMAL HIGH (ref <5)
Cholesterol: 235 mg/dL — ABNORMAL HIGH (ref <200)
HDL: 31 mg/dL — ABNORMAL LOW (ref >40)
Triglycerides: 498 mg/dL — ABNORMAL HIGH (ref <150)

## 2016-09-26 LAB — HEPATIC FUNCTION PANEL
ALT: 23 U/L (ref 5–33)
AST: 12 U/L (ref <32)
Albumin: 3.8 g/dL (ref 3.5–5.2)
Alkaline Phosphatase: 98 U/L (ref 35–104)
Bilirubin, Direct: <0.08 mg/dL (ref <0.31)
Total Bilirubin: 0.35 mg/dL (ref 0.3–1.2)
Total Protein: 6.5 g/dL (ref 6.4–8.3)

## 2016-09-26 LAB — TROP/MYOGLOBIN
Myoglobin: 23 ng/mL — ABNORMAL LOW (ref 25–58)
Troponin T: <0.03 ng/mL (ref <0.03)

## 2016-09-26 LAB — BRAIN NATRIURETIC PEPTIDE: Pro-BNP: 41 pg/mL (ref <300)

## 2016-09-26 LAB — APTT: PTT: 25.5 s (ref 23–31)

## 2016-09-26 LAB — MAGNESIUM: Magnesium: 1.7 mg/dL (ref 1.6–2.6)

## 2016-09-26 LAB — POCT URINE PREGNANCY: Preg Test, Ur: NEGATIVE

## 2016-09-26 LAB — LDL CHOLESTEROL, DIRECT: LDL Direct: 156 mg/dL — ABNORMAL HIGH (ref <100)

## 2016-09-26 LAB — LIPASE: Lipase: 25 U/L (ref 13–60)

## 2016-09-26 LAB — POC GLUCOSE FINGERSTICK: POC Glucose: 236 mg/dL — ABNORMAL HIGH (ref 65–105)

## 2016-09-26 MED ORDER — INSULIN DEGLUDEC 100 UNIT/ML SC SOPN
100 UNIT/ML | Freq: Every day | SUBCUTANEOUS | Status: DC
Start: 2016-09-26 — End: 2016-09-27

## 2016-09-26 MED ORDER — SODIUM CHLORIDE 0.9 % IV BOLUS
0.9 % | Freq: Once | INTRAVENOUS | Status: AC
Start: 2016-09-26 — End: 2016-09-26
  Administered 2016-09-26: 18:00:00 80 mL via INTRAVENOUS

## 2016-09-26 MED ORDER — ENOXAPARIN SODIUM 40 MG/0.4ML SC SOLN
40 MG/0.4ML | Freq: Every day | SUBCUTANEOUS | Status: DC
Start: 2016-09-26 — End: 2016-09-27
  Administered 2016-09-27 (×2): 40 mg via SUBCUTANEOUS

## 2016-09-26 MED ORDER — ARIPIPRAZOLE 5 MG PO TABS
5 MG | Freq: Every day | ORAL | Status: DC
Start: 2016-09-26 — End: 2016-09-27
  Administered 2016-09-27 (×2): 15 mg via ORAL

## 2016-09-26 MED ORDER — CITALOPRAM HYDROBROMIDE 10 MG PO TABS
10 MG | Freq: Every day | ORAL | Status: DC
Start: 2016-09-26 — End: 2016-09-26

## 2016-09-26 MED ORDER — CITALOPRAM HYDROBROMIDE 20 MG PO TABS
20 MG | Freq: Every day | ORAL | Status: DC
Start: 2016-09-26 — End: 2016-09-27
  Administered 2016-09-27: 13:00:00 20 mg via ORAL

## 2016-09-26 MED ORDER — ALBUTEROL SULFATE HFA 108 (90 BASE) MCG/ACT IN AERS
108 (90 Base) MCG/ACT | RESPIRATORY_TRACT | Status: DC | PRN
Start: 2016-09-26 — End: 2016-09-27

## 2016-09-26 MED ORDER — CANAGLIFLOZIN 300 MG PO TABS
300 MG | Freq: Every day | ORAL | Status: DC
Start: 2016-09-26 — End: 2016-09-26

## 2016-09-26 MED ORDER — ATORVASTATIN CALCIUM 80 MG PO TABS
80 MG | Freq: Every evening | ORAL | Status: DC
Start: 2016-09-26 — End: 2016-09-27
  Administered 2016-09-27: 80 mg via ORAL

## 2016-09-26 MED ORDER — EZETIMIBE 10 MG PO TABS
10 MG | Freq: Every evening | ORAL | Status: DC
Start: 2016-09-26 — End: 2016-09-26

## 2016-09-26 MED ORDER — IOPAMIDOL 76 % IV SOLN
76 % | Freq: Once | INTRAVENOUS | Status: AC | PRN
Start: 2016-09-26 — End: 2016-09-26
  Administered 2016-09-26: 18:00:00 75 mL via INTRAVENOUS

## 2016-09-26 MED ORDER — NORMAL SALINE FLUSH 0.9 % IV SOLN
0.9 % | INTRAVENOUS | Status: DC | PRN
Start: 2016-09-26 — End: 2016-09-27
  Administered 2016-09-26: 18:00:00 10 mL via INTRAVENOUS

## 2016-09-26 MED ORDER — LIRAGLUTIDE 18 MG/3ML SC SOPN
18 MG/3ML | Freq: Every day | SUBCUTANEOUS | Status: DC
Start: 2016-09-26 — End: 2016-09-27

## 2016-09-26 MED FILL — ALBUTEROL SULFATE HFA 108 (90 BASE) MCG/ACT IN AERS: 108 (90 Base) MCG/ACT | RESPIRATORY_TRACT | Qty: 1.2

## 2016-09-26 NOTE — Progress Notes (Signed)
[facility], Zetia is not stocked or supplied for our inpatients.     Please note that the Zetia is non-formulary and has been discontinued while inpatient. If you feel the patient needs to continue their home therapy during the inpatient stay, the patient may bring their Zetia for verification and administration pursuant to our home medication use policy.    At discharge, Zetia can be continued with your orders.   Please contact the pharmacy with any questions or concerns.  Thank you.  Clent JacksJisun Colton Engdahl, RPh/PharmD 09/26/2016 4:42 PM

## 2016-09-26 NOTE — ED Notes (Signed)
Boxed lunch and drink provided per request     Lenis DickinsonSandra E Yaritsa Savarino, RN  09/26/16 1610

## 2016-09-26 NOTE — ED Provider Notes (Signed)
[facility]  [place] [facility]  eMERGENCY dEPARTMENT eNCOUnter      Pt Name: Carolyn Crane  MRN: [id number]  Birthdate 12/25/1975  Date of evaluation: 09/26/2016  Provider: Carolyn StareKristin M Rozalyn Osland, APRN - CNP    CHIEF COMPLAINT       Chief Complaint   Patient presents with   . Chest Pain   . Nausea   . Headache   . Dizziness         HISTORY OF PRESENT ILLNESS  (Location/Symptom, Timing/Onset, Context/Setting, Quality, Duration, Modifying Factors, Severity.)   Carolyn Crane who presents to the emergency department via private auto for dry cough, mid to left chest pain, left jaw pain, headache, dizziness, diarrhea. Onset was 2 weeks ago. Denies headache, vision changes, fever, chills, vomiting, abdominal pain. She was treated here 09-15-16 and declined admission. Reports having a cardiac workup with stress test, echo, dopplers 6-8 years ago. States she has a strong family history of heart disease. Reports her grandfather passed away from an MI at 4844. Her uncle and other family members have also had significant cardiac events between ages 6636-46. Rates her pain 6/10 at this time.       Nursing Notes were reviewed.    ALLERGIES     Patient has no known allergies.    CURRENT MEDICATIONS       Previous Medications    ALBUTEROL SULFATE HFA (PROVENTIL HFA) 108 (90 BASE) MCG/ACT INHALER    Inhale 2 puffs into the lungs every 4 hours as needed for Wheezing or Shortness of Breath (Space out to every 6 hours as symptoms improve) Space out to every 6 hours as symptoms improve.    ALPRAZOLAM (XANAX) 1 MG TABLET    Take 1 mg by mouth as needed for Sleep or Anxiety.    ARIPIPRAZOLE (ABILIFY) 10 MG TABLET    Take 15 mg by mouth daily     ATORVASTATIN (LIPITOR) 80 MG TABLET    Take 80 mg by mouth    BENZONATATE (TESSALON PERLES) 100 MG CAPSULE    Take 1 capsule by mouth every 8 hours as needed for Cough    CANAGLIFLOZIN (INVOKANA) 300 MG TABS TABLET    Take 300 mg by mouth every morning (before breakfast)    CITALOPRAM  (CELEXA) 20 MG TABLET    Take 10 mg by mouth daily     EZETIMIBE (ZETIA) 10 MG TABLET        FENOFIBRATE 160 MG TABLET    Take 160 mg by mouth    IBUPROFEN (ADVIL;MOTRIN) 800 MG TABLET    Take 1 tablet by mouth every 8 hours as needed for Pain or Fever    INSULIN DEGLUDEC (TRESIBA FLEXTOUCH) 100 UNIT/ML SOPN    Inject 50 Units into the skin every morning (before breakfast)     LIRAGLUTIDE (VICTOZA SC)    Inject into the skin 0.6    OMEGA 3 1000 MG CAPS    Take by mouth    ONDANSETRON (ZOFRAN ODT) 4 MG DISINTEGRATING TABLET    Take 1 tablet by mouth every 8 hours as needed for Nausea       PAST MEDICAL HISTORY         Diagnosis Date   . Bipolar 1 disorder (HCC)    . Depression    . Diabetes mellitus (HCC)    . Hyperlipidemia    . Hypertension    . IBS (irritable bowel syndrome)  SURGICAL HISTORY           Procedure Laterality Date   . APPENDECTOMY     . CHOLECYSTECTOMY     . TONSILLECTOMY           FAMILY HISTORY     No family history on file.  No family status information on file.        SOCIAL HISTORY      reports that she has never smoked. She has never used smokeless tobacco. She reports that she drinks alcohol. She reports that she does not use drugs.    REVIEW OF SYSTEMS    (2-9 systems for level 4, 10 or more for level 5)     Review of Systems   Constitutional: Negative for chills, diaphoresis, fatigue and fever.   HENT: Negative for congestion, ear discharge, ear pain, postnasal drip, rhinorrhea, sinus pressure and sore throat.    Eyes: Negative for photophobia, pain and visual disturbance.   Respiratory: Positive for cough and shortness of breath.    Cardiovascular: Positive for chest pain. Negative for palpitations and leg swelling.   Gastrointestinal: Positive for diarrhea. Negative for abdominal pain, nausea and vomiting.   Genitourinary: Negative for dysuria, flank pain, frequency, hematuria and urgency.   Musculoskeletal: Positive for arthralgias and myalgias. Negative for back pain, neck pain and  neck stiffness.   Skin: Negative for color change and rash.   Neurological: Positive for dizziness, light-headedness and headaches. Negative for syncope, facial asymmetry, speech difficulty, weakness and numbness.   Psychiatric/Behavioral: Negative for confusion.      Except as noted above the remainder of the review of systems was reviewed and negative.     PHYSICAL EXAM    (up to 7 for level 4, 8 or more for level 5)     ED Triage Vitals [09/26/16 1241]   BP Temp Temp Source Pulse Resp SpO2 Height Weight   101/61 97.7 F (36.5 C) Oral 97 16 97 % 5\' 6"  (1.676 m) 195 lb (88.5 kg)     Physical Exam   Constitutional: She is oriented to person, place, and time. She appears well-developed and well-nourished. No distress.   HENT:   Right Ear: External ear normal.   Left Ear: External ear normal.   Mouth/Throat: Oropharynx is clear and moist.   Eyes: Conjunctivae and EOM are normal. Pupils are equal, round, and reactive to light.   Neck: Normal range of motion. Neck supple.   Cardiovascular: Normal rate, regular rhythm and normal heart sounds.    Pulmonary/Chest: Effort normal and breath sounds normal. No respiratory distress. She has no wheezes. She has no rales.   Abdominal: Soft. Bowel sounds are normal. She exhibits no distension. There is no tenderness. There is no rebound and no guarding.   Musculoskeletal: She exhibits no edema.   Neurological: She is alert and oriented to person, place, and time. She has normal strength. No cranial nerve deficit. Coordination and gait normal. GCS eye subscore is 4. GCS verbal subscore is 5. GCS motor subscore is 6.   Skin: Skin is warm and dry. No rash noted. She is not diaphoretic.   Psychiatric: She has a normal mood and affect. Her behavior is normal.   Vitals reviewed.      DIAGNOSTIC RESULTS     EKG: All EKG's are interpreted by the Emergency Department Physician who either signs or Co-signs this chart in the absence of a cardiologist.  EKG was interpreted by Dr. Gertha Calkin after  completion.  RADIOLOGY:   Non-plain film images such as CT, Ultrasound and MRI are read by the radiologist. Plain radiographic images are visualized and preliminarily interpreted by the emergency physician with the below findings:    Interpretation per the Radiologist below, if available at the time of this note:    Ct Chest Pulmonary Embolism W Contrast    Result Date: 09/26/2016  EXAMINATION: CTA OF THE CHEST 09/26/2016 1:48 pm TECHNIQUE: CTA of the chest was performed after the administration of intravenous contrast.  Multiplanar reformatted images are provided for review.  MIP images are provided for review. Dose modulation, iterative reconstruction, and/or weight based adjustment of the mA/kV was utilized to reduce the radiation dose to as low as reasonably achievable. COMPARISON: None. HISTORY: ORDERING SYSTEM PROVIDED HISTORY: chest pain, SOB 2 week history of symptoms. FINDINGS: Pulmonary Arteries: Pulmonary arteries are adequately opacified for evaluation.  No evidence of intraluminal filling defect to suggest pulmonary embolism.  Main pulmonary artery is normal in caliber. Mediastinum: The thoracic aorta is normal in course and caliber.  The heart is not enlarged.  No pericardial effusion.  No pathologic hilar or mediastinal adenopathy. Lungs/pleura: The lungs are without acute process.  No focal consolidation or pulmonary edema.  No evidence of pleural effusion or pneumothorax. Upper Abdomen: The gallbladder is surgically absent.  Visualized upper abdominal viscera are unremarkable. Soft Tissues/Bones: No acute bone or soft tissue abnormality.     No evidence of pulmonary embolism or acute pulmonary abnormality.       LABS:  Labs Reviewed   BASIC METABOLIC PANEL - Abnormal; Notable for the following:        Result Value    Glucose 248 (*)     Bun/Cre Ratio 24 (*)     All other components within normal limits   CBC WITH AUTO DIFFERENTIAL - Abnormal; Notable for the following:     RDW 11.3 (*)     Seg  Neutrophils 71 (*)     Lymphocytes 21 (*)     All other components within normal limits   TROP/MYOGLOBIN - Abnormal; Notable for the following:     Myoglobin 23 (*)     All other components within normal limits   URINE RT REFLEX TO CULTURE - Abnormal; Notable for the following:     Turbidity UA SLIGHTLY CLOUDY (*)     Glucose, Ur 1+ (*)     All other components within normal limits   MICROSCOPIC URINALYSIS - Abnormal; Notable for the following:     Mucus, UA 2+ (*)     All other components within normal limits   POCT URINE PREGNANCY - Normal   BRAIN NATRIURETIC PEPTIDE   MAGNESIUM   LIPASE   HEPATIC FUNCTION PANEL   PROTIME-INR   APTT       All other labs were within normal range or not returned as of this dictation.    EMERGENCY DEPARTMENT COURSE and DIFFERENTIAL DIAGNOSIS/MDM:   Vitals:    Vitals:    09/26/16 1300 09/26/16 1330 09/26/16 1400 09/26/16 1430   BP:       Pulse: 84 74 77 76   Resp: 17 15 17 14    Temp:       TempSrc:       SpO2: 98% 95% 97% 96%   Weight:       Height:             MEDICATIONS GIVEN IN THE ED:  Medications   sodium chloride flush 0.9 %  injection 10 mL (10 mLs Intravenous Given 09/26/16 1347)   0.9 % sodium chloride bolus (0 mLs Intravenous Stopped 09/26/16 1348)   iopamidol (ISOVUE-370) 76 % injection 75 mL (75 mLs Intravenous Given 09/26/16 1347)       CLINICAL DECISION MAKING:  The patient presented alert with a nontoxic appearance and was seen in conjunction with Dr. Gertha Calkin. The patient reported a significant family cardiac history. She was advised admission for cardiac workup on her previous visit and declined. She is diabetic. Laboratory studies were unremarkable. CT was negative for PE. She will be admitted for further evaluation and treatment.       CONSULTS:  IP CONSULT TO INTERNAL MEDICINE        FINAL IMPRESSION      1. Other chest pain            Problem List  Patient Active Problem List   Diagnosis Code   . Chest pain R07.9         DISPOSITION/PLAN   DISPOSITION Admitted 09/26/2016  03:09:55 PM      PATIENT REFERRED TO:   Richarda Blade, APRN - CNP  [address] [place] [address]  [phone]            DISCHARGE MEDICATIONS:     New Prescriptions    No medications on file           (Please note that portions of this note were completed with a voice recognition program.  Efforts were made to edit the dictations but occasionally words are mis-transcribed.)    Carolyn Stare, APRN - CNP      Carolyn Stare, APRN - CNP  09/26/16 1518

## 2016-09-26 NOTE — ED Notes (Signed)
Pt here with worsening left sided chest pain that radiates up her left neck and jaw, dry cough, dizzy, nausea.  Pt seen here 2 wks ago for same.  Pt was lying in bed when symptoms started. Pt lungs clear, heart tones WNL     Lenis DickinsonSandra E Zuma Hust, RN  09/26/16 1255

## 2016-09-26 NOTE — Progress Notes (Signed)
[facility] formul[facility] policy, SGLT2 Inhibitors are not stocked or supplied for our inpatients.     Please note that the  Canagliflozin (Invokana) is non-formulary and has been discontinued while inpatient. If you feel the patient needs to continue their home therapy during the inpatient stay, the patient may bring their medication bottle for verification and administration pursuant to our home medication use policy.      You may continue this at discharge.    Please contact the pharmacy with any questions or concerns.  Thank you.  Clent JacksJisun Eveleigh Crumpler, RPh/PharmD 09/26/2016 4:41 PM

## 2016-09-26 NOTE — ED Provider Notes (Signed)
Attending Supervisory Note/Shared Visit   I have personally interviewed,examined,and participated in the care of this patient.I agree with the NP/PA assessment and plan.  This 41 year old lady was seen in conjunction with the nurse practitioner.  Patient came in to the hospital with complaints of some chest pain that she's had over the last several days.  Patient states that the pain has been intermittent described as a pressure-like sensation in the left upper chest with intermittent radiation to the left neck and jaw area.  This is not accompanied by shortness of breath, diaphoresis, nausea or vomiting.  Patient is not a smoker and doesn't have any history of hypertension but does have history of hyperlipidemia.  She has a very strong family history of premature CAD.  On exam she was awake alert and oriented with stable vital signs and examination was completely normal including a cardiovascular examination and pulmonary examination.  There was no clinical suspicion for DVT.  Patient's EKG was nonischemic.  Her workup including a cardiac workup was negative and she had a negative chest CT in the ER as well.  Patient was seen here a few days ago for the same complaint and Dr. Joycelyn DasKatko wanted to admit her to the hospital for stress test but she signed out against medical advice.  She states that she had to work that's why she could not be admitted that day.  She still been having the chest pain as stated above.  Today she wants to be admitted and have a stress test.  Her last stress was several years ago and was negative.  Patient does have history of hypertension also.      (Please note that portions of this note were completed with a voice recognition program.  Efforts were made to edit the dictations but occasionally words are mis-transcribed.)    Loa SocksUsman B Karmen Altamirano, MD  Attending Emergency Physician        Loa SocksUsman B Oluwatomiwa Kinyon, MD  09/26/16 [phone]

## 2016-09-26 NOTE — ED Notes (Signed)
Telephone report given to PG&E CorporationSarah RN, [address]     Lavender Stanke E Cle ElumMull, [place]RN  09/26/16 [phone]

## 2016-09-26 NOTE — Progress Notes (Signed)
Pharmacy Accuracy Service Medication History Note    The patient's list of current home medications has been reviewed and updated by the Medication Accuracy Service.     Source(s) of information: patient, [employer]    Please feel free to call with any questions about this encounter. Thank you.    Roselle LocusAngeline E Pandora Mccrackin, PharmD  Pharmacy Medication Accuracy Review Service  Phone:  [phone]  Fax: [phone]            Prior to Admission medications    Medication Sig       NONFORMULARY Take 2 tablets by mouth daily OTC supplement GABA + Theanine dissolving tablets       atorvastatin (LIPITOR) 80 MG tablet Take 80 mg by mouth nightly       fenofibrate 160 MG tablet Take 160 mg by mouth daily       ezetimibe (ZETIA) 10 MG tablet Take 10 mg by mouth daily        Liraglutide (VICTOZA) 18 MG/3ML SOPN SC injection Inject 1.2 mg into the skin daily        citalopram (CELEXA) 10 MG tablet Take 20 mg by mouth daily Takes 2 tabs (=20mg ) daily       ARIPiprazole (ABILIFY) 15 MG tablet Take 15 mg by mouth daily        Omega 3 1000 MG CAPS Take 1,000 mg by mouth daily        canagliflozin (INVOKANA) 300 MG TABS tablet Take 300 mg by mouth every morning (before breakfast)       ALPRAZolam (XANAX) 1 MG tablet Take 1 mg by mouth daily as needed for Anxiety. .                       Insulin Degludec (TRESIBA FLEXTOUCH) 200 UNIT/ML SOPN Inject 50 Units into the skin every morning

## 2016-09-26 NOTE — Plan of Care (Signed)
Problem: Pain:  Goal: Pain level will decrease  Pain level will decrease   Outcome: Ongoing    Goal: Control of acute pain  Control of acute pain   Outcome: Ongoing  Pain level assessment complete.   Patient educated on pain scale and control interventions  PRN pain medication given per patient request  Patient instructed to call out with new onset of pain or unrelieved pain

## 2016-09-26 NOTE — Progress Notes (Signed)
Pt admitted from ER per cart, pt oriented to room and bed controls, pt placed on monitor, assessment and vitals complete, admission history completed, no needs voiced, will continue to monitor, Dr. Claybon JabsMaly notified of new consult

## 2016-09-27 LAB — POC GLUCOSE FINGERSTICK
POC Glucose: 221 mg/dL — ABNORMAL HIGH (ref 65–105)
POC Glucose: 210 mg/dL — ABNORMAL HIGH (ref 65–105)
POC Glucose: 217 mg/dL — ABNORMAL HIGH (ref 65–105)
POC Glucose: 192 mg/dL — ABNORMAL HIGH (ref 65–105)

## 2016-09-27 LAB — MICROALBUMIN, UR
Creatinine, Ur: 90.5 mg/dL (ref 28.0–217.0)
Microalb, Ur: <12 mg/L (ref <21)

## 2016-09-27 LAB — HEMOGLOBIN A1C
Estimated Avg Glucose: 183 mg/dL
Hemoglobin A1C: 8 % — ABNORMAL HIGH (ref 4.0–6.0)

## 2016-09-27 MED ORDER — GLUCOSE 40 % PO GEL
40 % | ORAL | Status: DC | PRN
Start: 2016-09-27 — End: 2016-09-27

## 2016-09-27 MED ORDER — INSULIN LISPRO 100 UNIT/ML SC SOLN
100 UNIT/ML | Freq: Every evening | SUBCUTANEOUS | Status: DC
Start: 2016-09-27 — End: 2016-09-27

## 2016-09-27 MED ORDER — GLUCAGON HCL RDNA (DIAGNOSTIC) 1 MG IJ SOLR
1 MG | INTRAMUSCULAR | Status: DC | PRN
Start: 2016-09-27 — End: 2016-09-27

## 2016-09-27 MED ORDER — DEXTROSE 5 % IV SOLN
5 % | INTRAVENOUS | Status: DC | PRN
Start: 2016-09-27 — End: 2016-09-27

## 2016-09-27 MED ORDER — DEXTROSE 50 % IV SOLN
50 % | INTRAVENOUS | Status: DC | PRN
Start: 2016-09-27 — End: 2016-09-27

## 2016-09-27 MED ORDER — INSULIN LISPRO 100 UNIT/ML SC SOLN
100 UNIT/ML | Freq: Three times a day (TID) | SUBCUTANEOUS | Status: DC
Start: 2016-09-27 — End: 2016-09-27
  Administered 2016-09-27 (×2): 4 [IU] via SUBCUTANEOUS
  Administered 2016-09-27: 21:00:00 2 [IU] via SUBCUTANEOUS

## 2016-09-27 MED ORDER — SODIUM CHLORIDE 0.9 % IV SOLN
0.9 % | INTRAVENOUS | Status: DC
Start: 2016-09-27 — End: 2016-09-27
  Administered 2016-09-27: 11:00:00 via INTRAVENOUS

## 2016-09-27 MED FILL — HUMALOG 100 UNIT/ML SC SOLN: 100 UNIT/ML | SUBCUTANEOUS | Qty: 10

## 2016-09-27 MED FILL — ABILIFY 5 MG PO TABS: 5 MG | ORAL | Qty: 3

## 2016-09-27 MED FILL — LOVENOX 40 MG/0.4ML SC SOLN: 40 MG/0.4ML | SUBCUTANEOUS | Qty: 0.4

## 2016-09-27 MED FILL — ATORVASTATIN CALCIUM 80 MG PO TABS: 80 MG | ORAL | Qty: 1

## 2016-09-27 MED FILL — CITALOPRAM HYDROBROMIDE 20 MG PO TABS: 20 MG | ORAL | Qty: 1

## 2016-09-27 NOTE — Care Coordination-Inpatient (Signed)
Case Management Initial Discharge Plan  Carolyn Crane,         Readmission Risk              Risk of Unplanned Readmission:        16               Met with:patient to discuss discharge plans.   Information verified: address, contacts, phone number, DOB, insurance Yes  PCP: Ronaldo Miyamoto, APRN - CNP  Date of last visit: years     Insurance Provider: caresource    Discharge Planning  Current Residence:  Private home   Living Arrangements:  Family Members   Home has 2 stories/1 flight stairs to climb  Support Systems:  Family Members     Current Services PTA:  None   Agency: none     Patient able to perform ADL's:Independent  DME in home:  None   DME used to aid ambulation prior to admission:  na  DME used during admission:  None     Potential Assistance Needed:  N/A    Pharmacy: [facility] on [address] Medications:  No  Does patient want to participate in local refill/ meds to beds program?  No    Patient agreeable to home care: No  Freedom of choice provided:  n/a      Type of Home Care Services:  None  Patient expects to be discharged to:  home    Prior SNF/Rehab Placement and Facility: none   Agreeable to SNF/Rehab: No  Freedom of choice provided: n/a  Social Services Evaluation: n/a    Expected Discharge date:  09/27/16  Follow Up Appointment: Best Day/ Time:      Transportation provider: per family  Transportation arrangements needed for discharge: No    Discharge Plan:   Patient lives at home with her mother. She is independent and drives. She has never used any home care nor DME in the past    Did discuss her lack of follow up with PCP. Per epic patient presented to ED on the following    5/22  4/29  4/27  4/23  4/9  3/18  3/17  3/5  3/2    When asked why she used ED and never followed up with PCP she stated "I don't know"    Explained the benefits of having a family doctor see you on a regular basis is much more productive then single visits for one reason. She verbalized  understanding. Did inform patient that if here tomorrow will make her a PCP appt as a priority and she is agreeable but asked for morning time as she works.     Will check on Monday to see when can get her into PCP office.         Electronically signed by Sherald Hess, RN on 09/27/16 at 12:42 PM

## 2016-09-27 NOTE — H&P (Signed)
Discharge Summary    Patient ID: Carolyn MooreKara M Spoerl    MRN: [id number]     Acct:  [id number]       Patient's PCP: Richarda BladeSara C Webb, APRN - CNP    Admit Date: 09/26/2016     Discharge Date: 09/27/2016      Admitting Physician: Bufford ButtnerLodovico Tylique Aull, MD    Discharge Physician: Bufford ButtnerLODOVICO Hendrix Console, MD     Discharge Diagnoses:  Chest Pain  Diabetes Mellitus, poor control  Dyslipidemia    Primary Problem  Chest pain    Active Hospital Problems    Diagnosis Date Noted   . Chest pain [R07.9] 09/26/2016   . 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 Dyslipidemia [E78.5] 09/26/2016     Past Medical History:   Diagnosis Date   . Bipolar 1 disorder (HCC)    . Depression    . Diabetes mellitus (HCC)    . Hyperlipidemia    . IBS (irritable bowel syndrome)      The patient was seen and examined on day of discharge and this discharge summary is in conjunction with any daily progress note from day of discharge.    Code Status:  Full Code    Hospital Course: uneventful.    Consults:  Cardiology (Dr. Claybon JabsMaly)    Significant Diagnostic Studies: see EHR.    Treatments: medical.    Disposition: home    Discharged Condition: Stable    Follow Up:  Richarda BladeSara C Webb, APRN - CNP - to to call office tomorrow for follow-up and to arrange stress test as out-paitnrt.  Pt also given Dr. Loletha GrayerMaly's office phone # - call in AM for follow-up. See details in H&P.    Discharge Medications:   Same home meds as listed in computer.   Carolyn MooreSchilling, Kylah M   Home Medication Instructions [id number]    Printed on:09/27/16 1816   Medication Information                      ALPRAZolam (XANAX) 1 MG tablet  Take 1 mg by mouth daily as needed for Anxiety. .             ARIPiprazole (ABILIFY) 15 MG tablet  Take 15 mg by mouth daily              atorvastatin (LIPITOR) 80 MG tablet  Take 80 mg by mouth nightly              canagliflozin (INVOKANA) 300 MG TABS tablet  Take 300 mg by mouth every morning (before breakfast)             citalopram (CELEXA) 10 MG tablet  Take 20  mg by mouth daily Takes 2 tabs (=20mg ) daily             ezetimibe (ZETIA) 10 MG tablet  Take 10 mg by mouth daily              fenofibrate 160 MG tablet  Take 160 mg by mouth daily              Insulin Degludec (TRESIBA FLEXTOUCH) 200 UNIT/ML SOPN  Inject 50 Units into the skin every morning              Liraglutide (VICTOZA) 18 MG/3ML SOPN SC injection  Inject 1.2 mg into the skin daily              NONFORMULARY  Take 2 tablets by mouth  daily OTC supplement GABA + Theanine dissolving tablets             Omega 3 1000 MG CAPS  Take 1,000 mg by mouth daily                   Activity: no strenuous activity. 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    Diet: diabetic diet    Time Spent on discharge is more than 30 minutes in the examination, evaluation, counseling and review of medications and discharge plan.      Electronically signed by Bufford Buttner, MD on 09/27/2016 at 6:16 PM     Thank you Dr. Richarda Blade, APRN - CNP for the opportunity to be involved in this patient's care.

## 2016-09-27 NOTE — Consults (Signed)
Cardiovascular Consult Note     TODAY'S DATE: 09/27/2016    Patient name: Carolyn Crane   Date of Birth: 1975-07-16  Date of admission:  09/26/2016       Patient seen, examined. Previous clinical entries reviewed.  All available laboratory, imaging and ancillary data reviewed.    Reason for Consult: Chest pain  Referring Physician: Dr. Shawnie Dapper    History of present Illness:     Carolyn Crane is a 41 y.o. female with past medical history significant for diabetes mellitus and hyperlipidemia who presented to the emergency room at [facility] complaints of persistent chest discomfort and was admitted for further management.    She complains of chest pain. Onset was 2 weeks ago, with unchanged course since that time. The patient describes the pain as intermittent, pressure like in nature, does not radiate. Patient rates pain as a 4/10 in intensity.  Associated symptoms are dyspnea and fatigue. Aggravating factors are none.  Alleviating factors are: none. Patient's cardiac risk factors are diabetes mellitus, dyslipidemia, family history of premature cardiovascular disease and obesity (BMI >= 30 kg/m2).  Her electrocardiogram was negative.  Cardiac markers are negative.  No recent cardiac workup.    Past Medical History:    has a past medical history of Bipolar 1 disorder (HCC); Depression; Diabetes mellitus (HCC); Hyperlipidemia; and IBS (irritable bowel syndrome).    Surgical History:     Past Surgical History:   Procedure Laterality Date   . APPENDECTOMY     . CHOLECYSTECTOMY     . TONSILLECTOMY         Medications:   Scheduled Meds:  . insulin lispro  0-12 Units Subcutaneous TID WC   . insulin lispro  0-6 Units Subcutaneous Nightly   . ARIPiprazole  15 mg Oral Daily   . atorvastatin  80 mg Oral Nightly   . Insulin Degludec  50 Units Subcutaneous QAM AC   . Liraglutide  0.6 mg Subcutaneous Daily   . enoxaparin  40 mg Subcutaneous Daily   . citalopram  20 mg Oral Daily     Continuous Infusions:  . dextrose     .  sodium chloride 150 mL/hr at 09/27/16 0636      Outpatient Prescriptions Marked as Taking for the 09/26/16 encounter [facility] Encounter)   Medication Sig Dispense Refill   . NONFORMULARY Take 2 tablets by mouth daily OTC supplement GABA + Theanine dissolving tablets     . atorvastatin (LIPITOR) 80 MG tablet Take 80 mg by mouth nightly      . fenofibrate 160 MG tablet Take 160 mg by mouth daily      . ezetimibe (ZETIA) 10 MG tablet Take 10 mg by mouth daily      . Liraglutide (VICTOZA) 18 MG/3ML SOPN SC injection Inject 1.2 mg into the skin daily      . citalopram (CELEXA) 10 MG tablet Take 20 mg by mouth daily Takes 2 tabs (=20mg ) daily     . ARIPiprazole (ABILIFY) 15 MG tablet Take 15 mg by mouth daily      . Omega 3 1000 MG CAPS Take 1,000 mg by mouth daily      . canagliflozin (INVOKANA) 300 MG TABS tablet Take 300 mg by mouth every morning (before breakfast)     . ALPRAZolam (XANAX) 1 MG tablet Take 1 mg by mouth daily as needed for Anxiety. .         Allergies:   Patient has no known  allergies.    Social History:    reports that she has never smoked. She has never used smokeless tobacco. She reports that she drinks alcohol. She reports that she does not use drugs.    Family History:    family history is not on file.    Review of Systems:     Constitutional: No fever/chills. HENT: No headache, neck pain or neck stiffnes. No sore throat or dysphagia. Eyes: No blurred vision. Respiratory: As above. Cardiovascular: As above. Gastrointestinal: Negative. Genitourinary: Negative  Endocrine: Positive for diabetes mellitus Musculoskeletal: Negative. Skin: Negative. Allergic/Immunologic: Negative.   Neurologic: Negative. Hematological: Negative. Psychiatric: Positive for bipolar disorder.  All other systems are are noted to be otherwise negative.      Physical Exam:   BP (!) 95/51   Pulse 66   Temp 98.2 F (36.8 C) (Oral)   Resp 16   Ht 5\' 6"  (1.676 m)   Wt 195 lb (88.5 kg)   LMP 09/12/2016 Comment: currently   SpO2 97%   BMI 31.47 kg/m     Intake/Output Summary (Last 24 hours) at 09/27/16 1316  Last data filed at 09/27/16 1300   Gross per 24 hour   Intake              300 ml   Output             1300 ml   Net            -1000 ml       GENERAL: Mildly obese middle-aged lady in no acute distress.  HEENT:  Head is atraumatic and normocephalic. No Pallor.  No icterus.  NECK: Supple without any thyromegaly.  LUNGS: Generally clear to auscultation  CARDIAC: S1, S2, RRR.  ABD:  Soft non-tender .  EXT: No edema.  MS: No obvious deformities. SKIN: No obvious skin rashes.  NEURO: No focal neurologic deficits.    Labs/ Ancillary data:     CBC:   Recent Labs      09/26/16   1257   WBC  8.3   HGB  13.9   PLT  356     BMP:  Recent Labs      09/26/16   1257   NA  137   K  4.2   CL  99   CO2  24   BUN  15   CREATININE  0.62   GLUCOSE  248*     Hepatic: Recent Labs      09/26/16   1257   AST  12   ALT  23   BILITOT  0.35   ALKPHOS  98     Troponin:   Recent Labs      09/26/16   1257   TROPONINT  <0.03     Lipids:   Recent Labs      09/26/16   1713   CHOL  235*   HDL  31*     INR:   Recent Labs      09/26/16   1257   INR  0.9     Imaging:    CXR: No acute infiltrates.    EKG: Normal    Impression :     Chest pain with multiple coronary disease risk factors.  Type 2 diabetes mellitus  Hyperlipidemia  Obesity    Plan :     Continue Lipitor and Zetia.  Willl get an echocardiogram and stress test to evaluate for cardiac causes of ischemia.  Further management based on results of stress test and echocardiogram.    Thank you very much for allowing [place] to participate in the care of this patient.    Please call us with any questions.    Electronically signed by Randa Spike, MD on 09/27/2016 at 1:23 PM

## 2016-09-27 NOTE — Plan of Care (Signed)
Problem: Pain:  Goal: Control of acute pain  Control of acute pain   Outcome: Ongoing  Pain level assessment complete.   Patient educated on pain scale and control interventions  PRN pain medication given per patient request  Patient instructed to call out with new onset of pain or unrelieved pain

## 2016-09-27 NOTE — Other (Signed)
Patient was sleeping, chaplain shared in silent prayer.  Chaplain leaves note of visit and prayer for possible follow up.     09/27/16 1238   Encounter Summary   Services provided to: Patient   Referral/Consult From: Rounding   Continue Visiting (09-27-16 PT: sleeping)   Complexity of Encounter Low   Length of Encounter 15 minutes   Spiritual Assessment Completed Yes   Routine   Type Initial   Assessment Sleeping   Intervention Prayer   Outcome Did not respond

## 2016-09-27 NOTE — H&P (Signed)
Department of Internal Medicine  Attending History and Physical        CHIEF COMPLAINT:  Chest Pain    History Obtained From:  Patient/records    HISTORY OF PRESENT ILLNESS:    41 year old female admitted from ER yesterday for intermittent  non-exertion related chest pains. Onset of symptoms about two weeks ago. Sharp, achy, and pressure-like at times with associated shortness of breath. There is no radiation of her pain. No light-headedness, syncope, palpitations, or diaphoresis. Her cardiac   enzymes were unremarkable. EKG showed no acute changes. At the present time she is symptom-free.   Past Medical/Surgical History:  Past Medical History:   Diagnosis Date   . Bipolar 1 disorder (HCC)    . Depression    . Diabetes mellitus (HCC)    . Hyperlipidemia    . IBS (irritable bowel syndrome)          Past Surgical History:   Procedure Laterality Date   . APPENDECTOMY     . CHOLECYSTECTOMY     . TONSILLECTOMY         No current facility-administered medications on file prior to encounter.      Current Outpatient Prescriptions on File Prior to Encounter   Medication Sig Dispense Refill   . atorvastatin (LIPITOR) 80 MG tablet Take 80 mg by mouth nightly      . fenofibrate 160 MG tablet Take 160 mg by mouth daily      . ezetimibe (ZETIA) 10 MG tablet Take 10 mg by mouth daily      . Liraglutide (VICTOZA) 18 MG/3ML SOPN SC injection Inject 1.2 mg into the skin daily      . citalopram (CELEXA) 10 MG tablet Take 20 mg by mouth daily Takes 2 tabs (=20mg ) daily     . ARIPiprazole (ABILIFY) 15 MG tablet Take 15 mg by mouth daily      . Omega 3 1000 MG CAPS Take 1,000 mg by mouth daily      . canagliflozin (INVOKANA) 300 MG TABS tablet Take 300 mg by mouth every morning (before breakfast)     . ALPRAZolam (XANAX) 1 MG tablet Take 1 mg by mouth daily as needed for Anxiety. .     . Insulin Degludec (TRESIBA FLEXTOUCH) 200 UNIT/ML SOPN Inject 50 Units into the skin every morning           Allergies:  Patient has no known  allergies.    Social History:   Social History     Social History   . Marital status: Divorced     Spouse name: N/A   . Number of children: N/A   . Years of education: N/A     Occupational History   . Not on file.     Social History Main Topics   . Smoking status: Never Smoker   . Smokeless tobacco: Never Used   . Alcohol use Yes      Comment: socially   . Drug use: No   . Sexual activity: Not Currently     Partners: Male     Other Topics Concern   . Not on file     Social History Narrative   . No narrative on file       Family History:   Positive for heart disease.    REVIEW OF SYSTEMS:  CONSTITUTIONAL:  negative for  fevers and chills  EYES:  negative for  double vision, blurred vision and dry eyes  HEENT:  negative for  ear drainage, earaches, nasal congestion and epistaxis  RESPIRATORY:  negative for  dry cough, cough with sputum, wheezing, hemoptysis and pleuritic pain  CARDIOVASCULAR:  negative for  palpitations, orthopnea, PND, exertional chest pressure/discomfort, edema, syncope  GASTROINTESTINAL:  negative for nausea, vomiting, regurgitation, hematemesis and hemtochezia  GENITOURINARY:  negative for nocturia and hematuria  INTEGUMENT/BREAST:  negative for rash and skin lesion(s)  HEMATOLOGIC/LYMPHATIC:  negative for easy bruising, bleeding, lymphadenopathy and petechiae  ALLERGIC/IMMUNOLOGIC:  No known allergies.  ENDOCRINE:  Diabetic, poor control. Admits to non-compliance with diet.  MUSCULOSKELETAL:  negative for  arthralgias and joint swelling  NEUROLOGICAL:  negative for dizziness, seizures, memory problems and speech problems  BEHAVIOR/PSYCH:  negative for depressed mood. Positive for anxiety.    PHYSICAL EXAM:  Vitals:  BP 119/68   Pulse 72   Temp 98.1 F (36.7 C) (Oral)   Resp 18   Ht 5\' 6"  (1.676 m)   Wt 195 lb (88.5 kg)   LMP 09/12/2016 Comment: currently  SpO2 97%   BMI 31.47 kg/m     General Appearance: alert and oriented to person, place and time and in no acute distress. Symptom-free  at this time.  Skin: warm and dry and no rash or erythema  Head: normocephalic and atraumatic  Eyes: pupils equal, round, and reactive to light, extraocular eye movements intact, conjunctivae normal and sclera anicteric  ENT: oropharynx clear and moist with normal mucous membranes  Neck: neck supple and non tender without mass, no thyromegaly, no thyroid nodules, no cervical adenopathy. Trachea midline, no stridor.  Pulmonary/Chest: clear to auscultation bilaterally- no wheezes, rales or rhonchi, normal air movement, no respiratory distress and no chest wall tenderness  Cardiovascular: normal rate, regular rhythm, normal S1 and S2, no murmurs, no gallops, intact distal pulses and no carotid bruits  Abdomen: soft, non-tender, non-distended, bowel sounds normal and no abdominal bruits  Genitourinary: deferred.  Extremities: no cyanosis, no clubbing, no edema and Homan's sign negative bilaterally  Musculoskeletal: no swollen joints, no joint deformity and no tender joints  Neurologic: reflexes normal and symmetric, no cranial nerve deficit and speech normal      DATA:  Reviewed/discussed with patient.    ASSESSMENT AND PLAN:      Active Problems:    Chest pain    Diabetes 1.5, managed as type 2 (HCC)    Dyslipidemia  Resolved Problems:    * No resolved hospital problems. *    Discussed with ER attending on day pf admission.  Labs/imaging studies report reviewed.  Meds reconciled/orders entered.    Added date:  Pt symptom-free at this time. Discussed care plan including stress test. She states on going home today and wants to have stress test scheduled tomorrow to be done as an out-patient. Dr. Claybon JabsMaly contacted. He has no objections to sending her home but he states that stress test will have to be rescheduled as the one for tomorrow (as in-patient) will be "invalidated". Dr. Claybon JabsMaly suggested that we advised patient to stay in the hospital till stress test is done. Info relayed to patient. States that she would rather go  home today and she'll call her PCP in the morning to arrange for out-patient stress test. Talked to Dr. Claybon JabsMaly again. He agrees and also recommended that patient call his office tomorrow as he may be able to arrange test also. RN instructed to provide patient Dr. Loletha GrayerMaly's office number.  Meds reconciled - same home meds. No new scripts. 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 Pt expressed understanding.    Electronically signed by Bufford Buttner, MD on 09/27/2016 at 5:39 PM

## 2016-09-27 NOTE — Progress Notes (Signed)
Patient discharged and left the room walking toward th ER with stable conditions. RN walked her and all questions were answered. Patient is leaving home and she will follow up with her doctor.

## 2016-09-28 LAB — POCT URINE PREGNANCY: HCG, Pregnancy Urine (POC): NEGATIVE

## 2017-01-10 ENCOUNTER — Inpatient Hospital Stay
Admit: 2017-01-10 | Discharge: 2017-01-10 | Disposition: A | Discharge status: Home / Self Care | Payer: PRIVATE HEALTH INSURANCE | Attending: Emergency Medicine

## 2017-01-10 ENCOUNTER — Emergency Department: Admit: 2017-01-10 | Payer: PRIVATE HEALTH INSURANCE | Primary: Nurse Practitioner

## 2017-01-10 DIAGNOSIS — E1165 Type 2 diabetes mellitus with hyperglycemia: Principal | ICD-10-CM

## 2017-01-10 LAB — EKG 12-LEAD
Atrial Rate: 88 {beats}/min
P Axis: 52 degrees
P-R Interval: 148 ms
Q-T Interval: 362 ms
QRS Duration: 80 ms
QTc Calculation (Bazett): 438 ms
R Axis: 66 degrees
T Axis: 42 degrees
Ventricular Rate: 88 {beats}/min

## 2017-01-10 LAB — CBC WITH AUTO DIFFERENTIAL
Absolute Eos #: 0.1 10*3/uL (ref 0.0–0.4)
Absolute Lymph #: 1.7 10*3/uL (ref 1.0–4.8)
Absolute Mono #: 0.4 10*3/uL (ref 0.2–0.8)
Basophils Absolute: 0.1 10*3/uL (ref 0.0–0.2)
Basophils: 1 % (ref 0–2)
Eosinophils %: 2 % (ref 1–4)
Hematocrit: 38.9 % (ref 36–46)
Hemoglobin: 13.1 g/dL (ref 12.0–16.0)
Lymphocytes: 22 % — ABNORMAL LOW (ref 24–44)
MCH: 31.6 pg (ref 26–34)
MCHC: 33.8 g/dL (ref 31–37)
MCV: 93.5 fL (ref 80–100)
Monocytes: 6 % (ref 1–7)
Platelets: 320 10*3/uL (ref 130–400)
RBC: 4.16 m/uL (ref 4.0–5.2)
RDW: 12.1 % (ref 11.5–14.5)
Seg Neutrophils: 69 % — ABNORMAL HIGH (ref 36–66)
Segs Absolute: 5.2 10*3/uL (ref 1.8–7.7)
WBC: 7.5 10*3/uL (ref 3.5–11.0)

## 2017-01-10 LAB — COMPREHENSIVE METABOLIC PANEL
ALT: 24 U/L (ref 5–33)
AST: 13 U/L (ref <32)
Albumin: 4 g/dL (ref 3.5–5.2)
Alkaline Phosphatase: 124 U/L — ABNORMAL HIGH (ref 35–104)
Anion Gap: 13 mmol/L (ref 9–17)
BUN: 17 mg/dL (ref 6–20)
Bun/Cre Ratio: 30 — ABNORMAL HIGH (ref 9–20)
CO2: 21 mmol/L (ref 20–31)
Calcium: 9.4 mg/dL (ref 8.6–10.4)
Chloride: 102 mmol/L (ref 98–107)
Creatinine: 0.56 mg/dL (ref 0.50–0.90)
GFR African American: >60 mL/min (ref >60)
GFR Non-African American: >60 mL/min (ref >60)
Glucose: 273 mg/dL — ABNORMAL HIGH (ref 70–99)
Potassium: 4.1 mmol/L (ref 3.7–5.3)
Sodium: 136 mmol/L (ref 135–144)
Total Bilirubin: 0.13 mg/dL — ABNORMAL LOW (ref 0.3–1.2)
Total Protein: 6.7 g/dL (ref 6.4–8.3)

## 2017-01-10 LAB — URINALYSIS
Bilirubin Urine: NEGATIVE
Leukocyte Esterase, Urine: NEGATIVE
Nitrite, Urine: NEGATIVE
Protein, UA: NEGATIVE
Specific Gravity, UA: 1.02 (ref 1.005–1.030)
Urine Hgb: NEGATIVE
Urobilinogen, Urine: NORMAL
pH, UA: 5.5 (ref 5.0–8.0)

## 2017-01-10 LAB — TROP/MYOGLOBIN
Myoglobin: <21 ng/mL — ABNORMAL LOW (ref 25–58)
Troponin T: <0.03 ng/mL (ref <0.03)

## 2017-01-10 LAB — PREGNANCY, URINE: HCG(Urine) Pregnancy Test: NEGATIVE

## 2017-01-10 LAB — APTT: PTT: 25 s (ref 23–31)

## 2017-01-10 LAB — BRAIN NATRIURETIC PEPTIDE: Pro-BNP: 54 pg/mL (ref <300)

## 2017-01-10 LAB — PROTIME-INR
INR: 0.9
Protime: 9 s — ABNORMAL LOW (ref 9.7–11.6)

## 2017-01-10 LAB — POC GLUCOSE FINGERSTICK: POC Glucose: 275 mg/dL — ABNORMAL HIGH (ref 65–105)

## 2017-01-10 MED ORDER — FUROSEMIDE 20 MG PO TABS
20 MG | ORAL_TABLET | Freq: Every day | ORAL | 0 refills | Status: DC
Start: 2017-01-10 — End: 2017-12-14

## 2017-01-10 MED ORDER — KETOROLAC TROMETHAMINE 30 MG/ML IJ SOLN
30 MG/ML | Freq: Once | INTRAMUSCULAR | Status: AC
Start: 2017-01-10 — End: 2017-01-10
  Administered 2017-01-10: 12:00:00 30 mg via INTRAVENOUS

## 2017-01-10 MED FILL — KETOROLAC TROMETHAMINE 30 MG/ML IJ SOLN: 30 MG/ML | INTRAMUSCULAR | Qty: 1

## 2017-01-10 NOTE — ED Provider Notes (Signed)
[facility] ED  eMERGENCY dEPARTMENT eNCOUnter    Pt Name: Carolyn Crane  MRN: [id number]  Birthdate Aug 28, 1975  Date of evaluation: 01/10/17  CHIEF COMPLAINT       Chief Complaint   Patient presents with   . Headache   . Hyperglycemia     HISTORY OF PRESENT ILLNESS   Presents with elevated blood sugar at work this morning. Noted to be 400 at 5 am. Had taken her Evaristo Bury before that. Took rapid acting afterwards. Out of other diabetes medications for the past week. Plans to see her Endocrinologist tomorrow. Notes frontal headache. No improvement with Motrin 800 mg last night. Notes polydipsia and polyuria, with hunger. No chest pain. No symptoms of infection. Notes b/l ankle swelling. History of dependent edema last year. Full cardiac w/u was negative. Contacted on-call Endocrinologist. Instructed to go to ED to check renal studies. Symptoms have been present all week, since running out of medications. Has been increasing [place] to compensate. Blood sugar was 160 yesterday      The history is provided by the patient.   Hyperglycemia   Blood sugar level PTA:  400  Severity:  Moderate  Onset quality:  Unable to specify  Duration:  1 day  Timing:  Constant  Progression:  Worsening  Chronicity:  New  Diabetes status:  Controlled with insulin  Current diabetic therapy:  Tresiba, Victoza, Invokana  Time since last antidiabetic medication:  2 hours  Context comment:  Ran out of Victoza and Invokana  Relieved by:  Insulin (rapid-acting)  Ineffective treatments:  None tried  Associated symptoms: increased appetite, increased thirst, polyuria and weight change    Associated symptoms: no abdominal pain, no chest pain, no confusion, no dysuria, no fever and no shortness of breath        REVIEW OF SYSTEMS     Review of Systems   Constitutional: Negative for fever.   HENT: Negative for congestion.    Respiratory: Negative for shortness of breath.    Cardiovascular: Negative for chest pain.   Gastrointestinal: Negative for  abdominal pain.   Endocrine: Positive for polydipsia and polyuria.   Genitourinary: Negative for dysuria.   Musculoskeletal: Negative for neck pain.   Skin: Negative for rash.   Neurological: Positive for headaches.   Hematological: Does not bruise/bleed easily.   Psychiatric/Behavioral: Negative for confusion.     PAST MEDICAL HISTORY     Past Medical History:   Diagnosis Date   . Bipolar 1 disorder (HCC)    . Depression    . Diabetes mellitus (HCC)    . Hyperlipidemia    . IBS (irritable bowel syndrome)      SURGICAL HISTORY       Past Surgical History:   Procedure Laterality Date   . APPENDECTOMY     . CHOLECYSTECTOMY     . TONSILLECTOMY       CURRENT MEDICATIONS       Discharge Medication List as of 01/10/2017  8:51 AM      CONTINUE these medications which have NOT CHANGED    Details   desvenlafaxine succinate (PRISTIQ) 50 MG TB24 extended release tablet Take 25 mg by mouth dailyHistorical Med      NONFORMULARY Take 2 tablets by mouth daily OTC supplement GABA + Theanine dissolving tabletsHistorical Med      fenofibrate 160 MG tablet Take 160 mg by mouth daily Historical Med      Liraglutide (VICTOZA) 18 MG/3ML SOPN SC injection Inject 1.2  mg into the skin daily Historical Med      Insulin Degludec (TRESIBA FLEXTOUCH) 200 UNIT/ML SOPN Inject 50 Units into the skin every morning Historical Med      ARIPiprazole (ABILIFY) 15 MG tablet Take 15 mg by mouth daily Historical Med      Omega 3 1000 MG CAPS Take 1,000 mg by mouth daily Historical Med      canagliflozin (INVOKANA) 300 MG TABS tablet Take 300 mg by mouth every morning (before breakfast)           ALLERGIES     is allergic to tramadol.  FAMILY HISTORY     has no family status information on file.      SOCIAL HISTORY      reports that she has never smoked. She has never used smokeless tobacco. She reports that she drinks alcohol. She reports that she does not use drugs.  PHYSICAL EXAM     INITIAL VITALS: BP (!) 145/88   Pulse 96   Temp 98.3 F (36.8 C)  (Oral)   Resp 16   Ht 5\' 6"  (1.676 m)   Wt 202 lb 9 oz (91.9 kg)   LMP 12/15/2016   SpO2 98%   BMI 32.69 kg/m    Physical Exam   Constitutional: She appears well-developed and well-nourished. She appears distressed (mild to moderate).   HENT:   Mouth/Throat: Oropharynx is clear and moist.   Eyes: Conjunctivae are normal.   Cardiovascular: Normal rate, regular rhythm and intact distal pulses.    Pulmonary/Chest: Effort normal and breath sounds normal. No respiratory distress. She has no wheezes. She has no rales.   Abdominal: Soft. There is no tenderness. There is no rebound.   Musculoskeletal: Normal range of motion. She exhibits edema. She exhibits no tenderness.   No calf tenderness or asymmetry. Dependent edema along entire b/l lower legs 1+ pitting.   Neurological: She is alert. Coordination normal.   Skin: No rash noted. No erythema. No pallor.   No erythema or warmth in lower legs   Psychiatric: She has a normal mood and affect. Her behavior is normal.       MEDICAL DECISION MAKING:    Hyperglycemia most likely due to running out of medications.  Dependent edema and other symptoms warrant workup.  Toradol for headache upon negative pregnancy test.  Patient agreeable.    I reviewed labs. Glucose < 300. No evidence of DKA.  Normal troponin.  BNP is not elevated.  Normal renal studies.    Patient feels better after Toradol.  Comfortable going home.    Discussed risks and benefits of treatment and Lasix.  Patient agreeable.  Prescription for low-dose Lasix provided.  Side effects discussed.    Recommend elevation of lower extremity strength    Plan to see her endocrinologist tomorrow.  Also suspects she may have refills of her medications at the pharmacy.    Return immediately for chest pain, shortness of breath, fever, or any other concerns.  Otherwise outpatient follow-up indicated.             PROCEDURES:    Procedures    DIAGNOSTIC RESULTS   EKG: All EKG's are interpreted by the Emergency Department  Physician who either signs or Co-signs this chart in the absence of a cardiologist.  Rhythm: Sinus  Rate: 88  Axis: normal  P-waves: biphasic in V1  QRS complexes: normal  T-waves: no inversions  ST segments: no elevations  Q-waves: absent  PR interval: normal  QTc interval: normal  R-wave progression: good  LVH: not noted  My interpretation: Sinus rhythm, LAE, no acute ischemia or injury    RADIOLOGY:All plain film, CT, MRI, and formal ultrasound images (except ED bedside ultrasound) are read by the radiologist, see reports below, unless otherwise noted in MDM or here.  XR CHEST STANDARD (2 VW)   Final Result   No acute findings.           LABS: All lab results were reviewed by myself, and all abnormals are listed below.  Labs Reviewed   CBC WITH AUTO DIFFERENTIAL - Abnormal; Notable for the following:        Result Value    Seg Neutrophils 69 (*)     Lymphocytes 22 (*)     All other components within normal limits   COMPREHENSIVE METABOLIC PANEL - Abnormal; Notable for the following:     Glucose 273 (*)     Bun/Cre Ratio 30 (*)     Alkaline Phosphatase 124 (*)     Total Bilirubin 0.13 (*)     All other components within normal limits   URINALYSIS - Abnormal; Notable for the following:     Glucose, Ur 3+ (*)     Ketones, Urine TRACE (*)     All other components within normal limits   PROTIME-INR - Abnormal; Notable for the following:     Protime 9.0 (*)     All other components within normal limits   TROP/MYOGLOBIN - Abnormal; Notable for the following:     Myoglobin <21 (*)     All other components within normal limits   POC GLUCOSE FINGERSTICK - Abnormal; Notable for the following:     POC Glucose 275 (*)     All other components within normal limits   BRAIN NATRIURETIC PEPTIDE   PREGNANCY, URINE   APTT     EMERGENCY DEPARTMENT COURSE:   Vitals:    Vitals:    01/10/17 0728   BP: (!) 145/88   Pulse: 96   Resp: 16   Temp: 98.3 F (36.8 C)   TempSrc: Oral   SpO2: 98%   Weight: 202 lb 9 oz (91.9 kg)   Height: 5\' 6"   (1.676 m)       The patient was given the following medications while in the emergency department:  Orders Placed This Encounter   Medications   . ketorolac (TORADOL) injection 30 mg   . furosemide (LASIX) 20 MG tablet     Sig: Take 1 tablet by mouth daily     Dispense:  10 tablet     Refill:  0     CONSULTS:  None    FINAL IMPRESSION      1. Hyperglycemia    2. Dependent edema    3. Nonintractable headache, unspecified chronicity pattern, unspecified headache type          DISPOSITION/PLAN   DISPOSITION Decision To Discharge 01/10/2017 08:49:28 AM      PATIENT REFERRED TO:  Your Endocrinologist    In 1 day      DISCHARGE MEDICATIONS:  Discharge Medication List as of 01/10/2017  8:51 AM      START taking these medications    Details   furosemide (LASIX) 20 MG tablet Take 1 tablet by mouth daily, Disp-10 tablet, R-0Print           Barbee Cough, MD  Attending Emergency Physician  Dragon voice recognition software used in portions of this document.  Josem Kaufmann, MD  01/10/17 [phone]

## 2017-01-10 NOTE — ED Notes (Signed)
Pt to ED with elevated glucose and hypertension respirations are non labored pt states she has had a fever on and off for two days.     Toma Aran, RN  01/10/17 [phone]

## 2017-02-06 ENCOUNTER — Inpatient Hospital Stay
Admit: 2017-02-06 | Discharge: 2017-02-07 | Disposition: A | Discharge status: Home / Self Care | Payer: PRIVATE HEALTH INSURANCE | Attending: Emergency Medicine

## 2017-02-06 DIAGNOSIS — G43909 Migraine, unspecified, not intractable, without status migrainosus: Secondary | ICD-10-CM

## 2017-02-06 NOTE — ED Notes (Signed)
Pt arrived to ED with c/o headache that has been intermittent for the last week. Pt denies history of Migraines but states history of Headaches. Pt states intermittent nausea but denies nausea at this time. Pt states taking Motrin and Benadryl at home with minimal relief. Pt states intermittent dizziness. Pt states photosensitivity. Pt denies blurred vision. Pt states pain is in front of head. Pt speech clear and talking in full sentences without complication. Pt ambulated to room without complication. Respirations non labored. Skin warm and dry. Skin color appropriate to race. Pt A&Ox4. Will continue to monitor.      Elaina Pattee, RN  02/06/17 2053

## 2017-02-06 NOTE — ED Provider Notes (Signed)
[facility]  Emergency Medicine Department    Pt Name: Carolyn Crane  MRN: [id number]  Birthdate [birth year]  Date of evaluation: 02/06/2017  Provider: Pedro Earls, MD    CHIEF COMPLAINT     Chief Complaint   Patient presents with   . Headache       HISTORY OF PRESENT ILLNESS  (Location/Symptom, Timing/Onset, Context/Setting,Quality, Duration, Modifying Factors, Severity.)   [name] is a 41 y.o. female who presents to the emergency department Complaining of migraine headache.  She first noticed a headache on Wednesday.  She went to urgent care and was given a shot of Toradol and sent home with ibuprofen and Benadryl.  She reports the headache initially got better but she woke up today after returning home from work and the headache had worsened.  She reports nausea and had one episode of vomiting on Wednesday but has not had any vomiting since then.  She states she feels like her head is in a vice.  She does have a history of migraines and reports this feels similar but more intense.  She denies any vision changes.  She denies any weakness or numbness in the extremities.     Nursing Notes were reviewed.    ALLERGIES     Tramadol    CURRENT MEDICATIONS       Discharge Medication List as of 02/06/2017  9:57 PM      CONTINUE these medications which have NOT CHANGED    Details   ibuprofen (ADVIL;MOTRIN) 800 MG tablet Take 800 mg by mouthHistorical Med      valACYclovir (VALTREX) 500 MG tablet Take 500 mg by mouthHistorical Med      diphenhydrAMINE (BENADRYL) 25 MG tablet Take 25 mg by mouth every 6 hours as needed for ItchingHistorical Med      desvenlafaxine succinate (PRISTIQ) 50 MG TB24 extended release tablet Take 25 mg by mouth dailyHistorical Med      Liraglutide (VICTOZA) 18 MG/3ML SOPN SC injection Inject 1.2 mg into the skin daily Historical Med      Insulin Degludec (TRESIBA FLEXTOUCH) 200 UNIT/ML SOPN Inject 50 Units into the skin every morning Historical Med      ARIPiprazole  (ABILIFY) 15 MG tablet Take 15 mg by mouth daily Historical Med      canagliflozin (INVOKANA) 300 MG TABS tablet Take 300 mg by mouth every morning (before breakfast)      furosemide (LASIX) 20 MG tablet Take 1 tablet by mouth daily, Disp-10 tablet, R-0Print      NONFORMULARY Take 2 tablets by mouth daily OTC supplement GABA + Theanine dissolving tabletsHistorical Med      fenofibrate 160 MG tablet Take 160 mg by mouth daily Historical Med      Omega 3 1000 MG CAPS Take 1,000 mg by mouth daily Historical Med             PAST MEDICAL HISTORY         Diagnosis Date   . Bipolar 1 disorder (HCC)    . Depression    . Diabetes mellitus (HCC)    . Hyperlipidemia    . IBS (irritable bowel syndrome)        SURGICAL HISTORY           Procedure Laterality Date   . APPENDECTOMY     . CHOLECYSTECTOMY     . TONSILLECTOMY         FAMILY HISTORY     History reviewed. No  pertinent family history.  No family status information on file.        SOCIAL HISTORY      reports that she has never smoked. She has never used smokeless tobacco. She reports that she drinks alcohol. She reports that she does not use drugs.    REVIEW OF SYSTEMS    (2-9 systems for level 4, 10 or more for level 5)     Review of Systems  GEN: no fevers  HEENT: No neck stiffness  CV: No CP  Pulm: No SOB  GI: No abdominal pain, +Nausea, +Vomiting  Neuro: +HA, No numbness.  No weakness  MSK:  No msk injuries    Except as noted above the remainder of the review of systems was reviewed and negative.     PHYSICAL EXAM    (up to 7 for level 4, 8 or more for level 5)     ED Triage Vitals [02/06/17 1954]   BP Temp Temp Source Pulse Resp SpO2 Height Weight   106/64 98.2 F (36.8 C) Oral 96 16 98 %  (1.651 m) 198 lb (89.8 kg)       Physical Exam   Constitutional: She is oriented to person, place, and time. She appears well-developed and well-nourished. No distress.   HENT:   Head: Normocephalic and atraumatic.   Eyes: Pupils are equal, round, and reactive to light. EOM  are normal.   Neck: Normal range of motion. Neck supple.   Cardiovascular: Normal rate, regular rhythm, normal heart sounds and intact distal pulses.    Pulmonary/Chest: Effort normal and breath sounds normal. No respiratory distress.   Abdominal: Soft. There is no tenderness. There is no guarding.   Musculoskeletal: Normal range of motion. She exhibits no tenderness or deformity.   Lymphadenopathy:     She has no cervical adenopathy.   Neurological: She is alert and oriented to person, place, and time. No cranial nerve deficit or sensory deficit.   Full strength in bilateral upper and lower extremities with finger to nose intact and rapid alternating movements intact.   Skin: Skin is warm and dry. No rash noted.   Psychiatric: She has a normal mood and affect. Her behavior is normal. Judgment and thought content normal.   Nursing note and vitals reviewed.      DIAGNOSTIC RESULTS     RADIOLOGY:   Non-plain film images such as CT, Ultrasound and MRI are read by theradiologist. Plain radiographic images are visualized and preliminarily interpreted by the emergency physician with the below findings:    None indicated     ED BEDSIDE ULTRASOUND:   Performed by ED Physician - none    LABS:  Labs Reviewed - No data to display    All other labs were within normal range or not returned as of thisdictation.    EMERGENCYDEPARTMENT COURSE and DIFFERENTIAL DIAGNOSIS/MDM:   Vitals:    Vitals:    02/06/17 1954   BP: 106/64   Pulse: 96   Resp: 16   Temp: 98.2 F (36.8 C)   TempSrc: Oral   SpO2: 98%   Weight: 198 lb (89.8 kg)   Height:  (1.651 m)     41 y.o. F with a history of headaches presenting with headache for several days.  No red flags to warrant CT imaging at this time.  Normal neuro exam.  No fevers.  Treated with IV migraine cocktail with significant improvement of symptoms.  Will discharge with recommendations for PCP  f/u.    CONSULTS:  None    PROCEDURES:  None indicated    FINAL IMPRESSION     1. Migraine without  status migrainosus, not intractable, unspecified migraine type          DISPOSITION/PLAN   DISPOSITION Decision To Discharge 02/06/2017 09:57:02 PM    PATIENT REFERRED TO:   Stoney Bang, APRN - CNP  [id number]  [place] [address]  [phone]    Schedule an appointment as soon as possible for a visit in 2 weeks  For Follow up    DISCHARGE MEDICATIONS:     Discharge Medication List as of 02/06/2017  9:57 PM        (Please note that portions of this note were completed with a voice recognition program.  Efforts were made to edit the dictations butoccasionally words are mis-transcribed.)    Pedro Earls, MD  Attending Emergency Physician          Pedro Earls, MD  02/08/17 1128

## 2017-02-07 MED ORDER — KETOROLAC TROMETHAMINE 15 MG/ML IJ SOLN
15 MG/ML | Freq: Once | INTRAMUSCULAR | Status: AC
Start: 2017-02-07 — End: 2017-02-06
  Administered 2017-02-07: 01:00:00 15 mg via INTRAVENOUS

## 2017-02-07 MED ORDER — SODIUM CHLORIDE 0.9 % IV BOLUS
0.9 % | Freq: Once | INTRAVENOUS | Status: AC
Start: 2017-02-07 — End: 2017-02-06
  Administered 2017-02-07: 01:00:00 1000 mL via INTRAVENOUS

## 2017-02-07 MED ORDER — PROCHLORPERAZINE EDISYLATE 5 MG/ML IJ SOLN
5 MG/ML | Freq: Once | INTRAMUSCULAR | Status: AC
Start: 2017-02-07 — End: 2017-02-06
  Administered 2017-02-07: 01:00:00 10 mg via INTRAVENOUS

## 2017-02-07 MED ORDER — DIPHENHYDRAMINE HCL 50 MG/ML IJ SOLN
50 MG/ML | Freq: Once | INTRAMUSCULAR | Status: AC
Start: 2017-02-07 — End: 2017-02-06
  Administered 2017-02-07: 01:00:00 25 mg via INTRAVENOUS

## 2017-05-30 ENCOUNTER — Inpatient Hospital Stay
Admit: 2017-05-30 | Discharge: 2017-05-30 | Disposition: A | Discharge status: Home / Self Care | Payer: PRIVATE HEALTH INSURANCE | Attending: Emergency Medicine

## 2017-05-30 DIAGNOSIS — B085 Enteroviral vesicular pharyngitis: Secondary | ICD-10-CM

## 2017-05-30 LAB — RAPID INFLUENZA A/B ANTIGENS: Direct Exam: NEGATIVE

## 2017-05-30 MED ORDER — ACETAMINOPHEN 500 MG PO TABS
500 MG | Freq: Once | ORAL | Status: AC
Start: 2017-05-30 — End: 2017-05-30
  Administered 2017-05-30: 21:00:00 500 mg via ORAL

## 2017-05-30 MED ORDER — IBUPROFEN 600 MG PO TABS
600 MG | Freq: Once | ORAL | Status: AC
Start: 2017-05-30 — End: 2017-05-30
  Administered 2017-05-30: 21:00:00 600 mg via ORAL

## 2017-05-30 MED ORDER — MAGIC MOUTHWASH
Freq: Four times a day (QID) | 0 refills | Status: DC | PRN
Start: 2017-05-30 — End: 2017-12-14

## 2017-05-30 MED FILL — IBUPROFEN 600 MG PO TABS: 600 mg | ORAL | Qty: 1

## 2017-05-30 MED FILL — TYLENOL EXTRA STRENGTH 500 MG PO TABS: 500 mg | ORAL | Qty: 1

## 2017-05-30 NOTE — ED Provider Notes (Signed)
[facility] ED  eMERGENCY dEPARTMENT eNCOUnter      Pt Name: Carolyn Crane  MRN: [id number]  Birthdate 1975/06/22  Date of evaluation: 05/30/2017  Provider: Casilda Carls, APRN - CNP    CHIEF COMPLAINT       Chief Complaint   Patient presents with   . Headache   . Cough     non prod    . Pharyngitis     all symptoms past 2 days (recently diagnosed with "PMR")         HISTORY OF PRESENT ILLNESS  (Location/Symptom, Timing/Onset, Context/Setting, Quality, Duration, Modifying Factors, Severity.)   Carolyn Crane is a 42 y.o. female who presents to the emergency department  With complaints of a swollen throat, pain with swallowing, headache, fatigue.  She was recently diagnosed with polymyalgia rheumatica.  She feels that she may have joint cell arteritis.  She states that she has looked this up and she feels she has a lot of the symptoms.  She last saw her PCP on 05/26/2017 and had lab work done.  The patient received a referral to rheumatology and was seen rheumatology sometime this week.  She states that the headache, fatigue and muscle discomfort is not new.  She is concerned because she is having a feeling of a swollen throat.  She has no difficulty handling her secretions.  She denies any fever or chills.  No earache.  She does complain of a stuffy runny nose.  She drove herself to the hospital for treatment.      Nursing Notes were reviewed.    ALLERGIES     Patient has no known allergies.    CURRENT MEDICATIONS       Previous Medications    ARIPIPRAZOLE (ABILIFY) 15 MG TABLET    Take 15 mg by mouth daily     CANAGLIFLOZIN (INVOKANA) 300 MG TABS TABLET    Take 300 mg by mouth every morning (before breakfast)    DESVENLAFAXINE SUCCINATE (PRISTIQ) 50 MG TB24 EXTENDED RELEASE TABLET    Take 25 mg by mouth daily    DIPHENHYDRAMINE (BENADRYL) 25 MG TABLET    Take 25 mg by mouth every 6 hours as needed for Itching    FENOFIBRATE 160 MG TABLET    Take 160 mg by mouth daily     FUROSEMIDE (LASIX) 20 MG TABLET    Take  1 tablet by mouth daily    IBUPROFEN (ADVIL;MOTRIN) 800 MG TABLET    Take 800 mg by mouth    INSULIN DEGLUDEC (TRESIBA FLEXTOUCH) 200 UNIT/ML SOPN    Inject 50 Units into the skin every morning     LIRAGLUTIDE (VICTOZA) 18 MG/3ML SOPN SC INJECTION    Inject 1.2 mg into the skin daily     NONFORMULARY    Take 2 tablets by mouth daily OTC supplement GABA + Theanine dissolving tablets    OMEGA 3 1000 MG CAPS    Take 1,000 mg by mouth daily     PREDNISONE (DELTASONE) 10 MG TABLET    Take 10 mg by mouth daily On 05/30/17 pt states started 4 days ago for "PMR"    VALACYCLOVIR (VALTREX) 500 MG TABLET    Take 500 mg by mouth       PAST MEDICAL HISTORY         Diagnosis Date   . Bipolar 1 disorder (HCC)    . Depression    . Diabetes mellitus (HCC)    . Hyperlipidemia    .  IBS (irritable bowel syndrome)    . PCOS (polycystic ovarian syndrome)    . PMR (polymyalgia rheumatica) (HCC)        SURGICAL HISTORY           Procedure Laterality Date   . APPENDECTOMY     . CHOLECYSTECTOMY     . TONSILLECTOMY           FAMILY HISTORY     History reviewed. No pertinent family history.  No family status information on file.        SOCIAL HISTORY      reports that she has never smoked. She has never used smokeless tobacco. She reports that she drinks alcohol. She reports that she does not use drugs.    REVIEW OF SYSTEMS    (2-9 systems for level 4, 10 or more for level 5)     Denies fever/chills  Denies lightheadedness, headache  Denies Neck or back pain  Denies Shortness of breath, cough  Denies chest pain, palpitations  Denies abdominal pain  Denies urinary frequency, urgency or dysuria  Except as noted above the remainder of the review of systems was reviewed and negative.     PHYSICAL EXAM    (up to 7 for level 4, 8 or more for level 5)     ED Triage Vitals [05/30/17 1439]   BP Temp Temp Source Pulse Resp SpO2 Height Weight   125/84 98.5 F (36.9 C) Oral 92 18 98 % 5\' 5"  (1.651 m) 200 lb (90.7 kg)       General Appearance:  Alert,  cooperative,    Head:  Normocephalic,     Eyes:  conjunctiva/corneas clear, EOM's intact.      ENT: Mucous membranes moist. Ear exam reveals gray and flat tympanic membranes no preauricular or postauricular lymphadenopathy.  Inferior turbinates are pink and not swollen.  The uvula and post pharynx are slightly reddened with small herpetic lesion. No swelling. No exudate.   Neck: Supple,  trachea midline. No cervical lymphadenopathy.     Lungs:   Lungs clear to auscultation.Marland Kitchen. Respirations eupneic   Heart: RRR, Normal skin color   Extremities:  Pulses:    Skin:   Neurologic:    Full ROM without pain or distal swelling.    Radial/ulnar pulses 3+/4   No rashes or lesions to exposed skin   Alert and oriented. Motor grossly normal. Speech clear                    DIAGNOSTIC RESULTS       No orders to display           LABS:  Labs Reviewed   RAPID INFLUENZA A/B ANTIGENS       All other labs were within normal range or not returned as of this dictation.    EMERGENCY DEPARTMENT COURSE and DIFFERENTIAL DIAGNOSIS/MDM:   Vitals:    Vitals:    05/30/17 1439   BP: 125/84   Pulse: 92   Resp: 18   Temp: 98.5 F (36.9 C)   TempSrc: Oral   SpO2: 98%   Weight: 200 lb (90.7 kg)   Height: 5\' 5"  (1.651 m)       Medical Decision Making:  42 year old female with sore throat and swallowing difficulty.  Exam reveals herpangina.  She'll be discharged home with prescription for Magic mouthwash.  All of the patient's old records and recent lab work was reviewed and discussed with Dr. Sheryle Haildiamond prior to discharge.  The patient appears very comfortable with no difficulty swallowing or handling her own secretions.  She has been advised to return to the emergency Department if current symptoms increase in severity, otherwise she was advised to keep her appointment with rheumatology and her family doctor.    CONSULTS:  None    PROCEDURES:  None    FINAL IMPRESSION    No diagnosis found.      DISPOSITION/PLAN   DISPOSITION        PATIENT REFERRED TO:    No follow-up provider specified.    DISCHARGE MEDICATIONS:     New Prescriptions    No medications on file           (Please note that portions of this note were completed with a voice recognition program.  Efforts were made to edit the dictations but occasionally words are mis-transcribed.)    Casilda Carls, APRN - CNP  Certified Nurse Practitioner         Casilda Carls, APRN - CNP  06/01/17 1005

## 2017-05-30 NOTE — Discharge Instructions (Signed)
Use the miracle mouthwash as directed.  Gargle it and you may swallow it.  Do not use more than 4 times a day.  Tylenol for discomfort  Continue with  steroids.  Keep your appointment with your rheumatologist for this week

## 2017-05-31 LAB — POCT URINE PREGNANCY: HCG, Pregnancy Urine (POC): NEGATIVE

## 2017-11-01 ENCOUNTER — Inpatient Hospital Stay
Admit: 2017-11-01 | Discharge: 2017-11-02 | Disposition: A | Discharge status: Home / Self Care | Payer: PRIVATE HEALTH INSURANCE | Attending: Emergency Medicine

## 2017-11-01 DIAGNOSIS — E11649 Type 2 diabetes mellitus with hypoglycemia without coma: Principal | ICD-10-CM

## 2017-11-01 LAB — POC GLUCOSE FINGERSTICK
POC Glucose: 129 mg/dL — ABNORMAL HIGH (ref 65–105)
POC Glucose: 156 mg/dL — ABNORMAL HIGH (ref 65–105)

## 2017-11-01 NOTE — ED Notes (Signed)
Pt was at work and felt her sugar be low and tested it (48) Pt ate a candy bar and a 12 oz soda and returned to normal. Pt BGL this morning was 460, and she normally runs high. Pt has taken her insulin as prescribed and is concerned about the fulx of her levels. Pt is talking in complete sentences and only complain is a headache.      Merton Borderyler J Michal Strzelecki  11/01/17 1820

## 2017-11-01 NOTE — ED Provider Notes (Signed)
[place] [facility] ED  eMERGENCY dEPARTMENT eNCOUnter      Pt Name: Carolyn Crane  MRN: [id number]  Birthdate 1975/07/08  Date of evaluation: 11/01/2017  Provider: Eudelia Bunch, APRN - CNP    CHIEF COMPLAINT       Chief Complaint   Patient presents with   ??? Hypoglycemia     sts BS was 48 at work approx 1730, drank a coke and had a snickers, fluctuating BS recently         HISTORY OFPRESENT ILLNESS  (Location/Symptom, Timing/Onset, Context/Setting, Quality, Duration, Modifying Factors, Severity.)   Carolyn Crane is a 42 y.o. female who presents to the emergency department valuation of glycemia.  Patient has a history of diabetes and takes insulin and pills.  She states she felt shaky at work today and checked her blood sugar was 48.  She states she then ate a candy bar and had a pop.  She checked it again was 156.  Upon arrival to the ED it was 175.  Planes of headache.  No abdominal pain, nausea or vomiting.  She denies recent changes in any of her diabetic medications.  Sees an endocrinologist for her diabetes.  No chest pain.      Nursing Notes were reviewed.    PASTMEDICAL HISTORY     Past Medical History:   Diagnosis Date   ??? Bipolar 1 disorder (HCC)    ??? Depression    ??? Diabetes mellitus (HCC)    ??? Hyperlipidemia    ??? IBS (irritable bowel syndrome)    ??? PCOS (polycystic ovarian syndrome)    ??? PMR (polymyalgia rheumatica) (HCC)          SURGICAL HISTORY       Past Surgical History:   Procedure Laterality Date   ??? APPENDECTOMY     ??? CHOLECYSTECTOMY     ??? TONSILLECTOMY           CURRENT MEDICATIONS     Current Discharge Medication List      CONTINUE these medications which have NOT CHANGED    Details   predniSONE (DELTASONE) 10 MG tablet Take 10 mg by mouth daily On 05/30/17 pt states started 4 days ago for "PMR"      Magic Mouthwash (MIRACLE MOUTHWASH) Swish and spit 5 mLs 4 times daily as needed for Irritation  Qty: 240 mL, Refills: 0      ibuprofen (ADVIL;MOTRIN) 800 MG tablet Take 800 mg by mouth      valACYclovir  (VALTREX) 500 MG tablet Take 500 mg by mouth      diphenhydrAMINE (BENADRYL) 25 MG tablet Take 25 mg by mouth every 6 hours as needed for Itching      desvenlafaxine succinate (PRISTIQ) 50 MG TB24 extended release tablet Take 25 mg by mouth daily      furosemide (LASIX) 20 MG tablet Take 1 tablet by mouth daily  Qty: 10 tablet, Refills: 0      NONFORMULARY Take 2 tablets by mouth daily OTC supplement GABA + Theanine dissolving tablets      fenofibrate 160 MG tablet Take 160 mg by mouth daily       Liraglutide (VICTOZA) 18 MG/3ML SOPN SC injection Inject 1.2 mg into the skin daily       Insulin Degludec (TRESIBA FLEXTOUCH) 200 UNIT/ML SOPN Inject 50 Units into the skin every morning       ARIPiprazole (ABILIFY) 15 MG tablet Take 15 mg by mouth daily  Omega 3 1000 MG CAPS Take 1,000 mg by mouth daily       canagliflozin (INVOKANA) 300 MG TABS tablet Take 300 mg by mouth every morning (before breakfast)             ALLERGIES     Patient has no known allergies.    FAMILY HISTORY     History reviewed. No pertinent family history.       SOCIAL HISTORY       Social History     Socioeconomic History   ??? Marital status: Divorced     Spouse name: None   ??? Number of children: None   ??? Years of education: None   ??? Highest education level: None   Occupational History   ??? None   Social Needs   ??? Financial resource strain: None   ??? Food insecurity:     Worry: None     Inability: None   ??? Transportation needs:     Medical: None     Non-medical: None   Tobacco Use   ??? Smoking status: Never Smoker   ??? Smokeless tobacco: Never Used   Substance and Sexual Activity   ??? Alcohol use: Yes     Comment: socially   ??? Drug use: No   ??? Sexual activity: None   Lifestyle   ??? Physical activity:     Days per week: None     Minutes per session: None   ??? Stress: None   Relationships   ??? Social connections:     Talks on phone: None     Gets together: None     Attends religious service: None     Active member of club or organization: None      Attends meetings of clubs or organizations: None     Relationship status: None   ??? Intimate partner violence:     Fear of current or ex partner: None     Emotionally abused: None     Physically abused: None     Forced sexual activity: None   Other Topics Concern   ??? None   Social History Narrative   ??? None         REVIEW OF SYSTEMS    (2-9 systems for level 4, 10 or more for level 5)     Review of Systems   Constitutional: Positive for activity change.   Eyes: Negative for visual disturbance.   Cardiovascular: Negative for chest pain.   Gastrointestinal: Negative for abdominal pain, nausea and vomiting.   Neurological: Positive for headaches.   All other systems reviewed and are negative.    Except as noted above the remainder of the review of systems was reviewed and negative.     PHYSICAL EXAM    (up to 7 for level 4, 8 or more for level 5)     ED Triage Vitals [11/01/17 1759]   BP Temp Temp Source Pulse Resp SpO2 Height Weight   134/78 98.3 ??F (36.8 ??C) Oral 89 16 96 % 5\' 5"  (1.651 m) 210 lb (95.3 kg)       Physical Exam   Constitutional: She is oriented to person, place, and time. She appears well-developed and well-nourished.   HENT:   Head: Normocephalic and atraumatic.   Right Ear: External ear normal.   Left Ear: External ear normal.   Nose: Nose normal.   Mouth/Throat: Oropharynx is clear and moist.   Eyes: Pupils are equal, round, and reactive to  light. Conjunctivae and EOM are normal.   Neck: Normal range of motion.   Cardiovascular: Normal rate, regular rhythm, normal heart sounds and intact distal pulses.   Pulmonary/Chest: Effort normal. No respiratory distress.   Abdominal: Soft. Bowel sounds are normal. There is no tenderness.   Musculoskeletal: Normal range of motion.   Neurological: She is alert and oriented to person, place, and time. She has normal strength. No cranial nerve deficit or sensory deficit. GCS eye subscore is 4. GCS verbal subscore is 5. GCS motor subscore is 6.   Skin: Skin is warm  and dry. Capillary refill takes less than 2 seconds.   Psychiatric: She has a normal mood and affect. Her behavior is normal. Judgment and thought content normal.         DIAGNOSTIC RESULTS     EKG:All EKG's are interpreted by the Emergency Department Physician who either signs or Co-signs this chart in the absence of a cardiologist.        RADIOLOGY:   Non-plain film images such as CT, Ultrasound and MRI are read by theradiologist. Plain radiographic images are visualized and preliminarily interpreted by the emergency physician with the below findings:        Interpretation per the Radiologist below, if available at the time of this note:    No orders to display         EDBEDSIDE ULTRASOUND:   Performed by EDPhysician - none    LABS:  @EDLABS @    All other labs were within normal range or not returned as of this dictation.    EMERGENCY DEPARTMENT COURSE andDIFFERENTIAL DIAGNOSIS/MDM:   Patient was evaluated in conjunction with attending physician.  Glucose 156 upon arrival.  She was given a boxed lunch and blood sugar was rechecked and she is 129.  Patient states she feels better.  She will be discharged home instructed to follow-up with her endocrinologist.            Vitals:    Vitals:    11/01/17 1759   BP: 134/78   Pulse: 89   Resp: 16   Temp: 98.3 ??F (36.8 ??C)   TempSrc: Oral   SpO2: 96%   Weight: 210 lb (95.3 kg)   Height: 5\' 5"  (1.651 m)         CONSULTS:  None    PROCEDURES:  Procedures    FINAL IMPRESSION      1. Hypoglycemia          DISPOSITION/PLAN   DISPOSITION Decision To Discharge 11/01/2017 08:40:15 PM      PATIENT REFERRED TO:     Follow-up with your endocrinologist.  Schedule an appointment as soon as possible for a visit       Stoney BangJoanna Shaw, APRN - CNP  [address]  [place] [id number]  [phone]    In 1 week      [facility]  [address] [place] [address]  [place] [id number]  [phone]    As needed      DISCHARGE MEDICATIONS:     Current Discharge Medication List        Electronically  signed by Eudelia BunchEric Shariyah Eland, APRN - CNPon 11/01/2017 at 8:41 PM           Eudelia BunchEric Hanzel Pizzo, APRN - CNP  11/01/17 2042

## 2017-11-01 NOTE — ED Provider Notes (Signed)
EMERGENCY DEPARTMENT ENCOUNTER   ATTENDING ATTESTATION     Pt Name: Carolyn Crane  MRN: [id number]  Birthdate 01/26/1976  Date of evaluation: 11/01/17   Carolyn Crane is a 42 y.o. female with CC: Hypoglycemia (sts BS was 48 at work approx 1730, drank a coke and had a snickers, fluctuating BS recently)    MDM:   Presents for low blood sugar. Noted to be 48 at work. Felt unwell with mild headache. Better with Coke and Snickers. Ate this morning. Took Insulin this morning. H/o fluctuating blood sugars.    Sitting upright. Appears well.    BS is 156 in ED.    Plan food tray, and recheck. Patient lives with her boyfriend, and prefers to be released if remains stable.    BS decreased to 120's.    Patient prefers to stay another hour.    BS remains stable without additional oral intake. Patient now comfortable with plan to release.    Early f/u with PCP and/or Endocrinologist indicated.    Return immediately for recurrence or any other concerns         CRITICAL CARE:       EKG: All EKG's are interpreted by the Emergency Department Physician who either signs or Co-signs this chart in the absence of a cardiologist.      RADIOLOGY:All plain film, CT, MRI, and formal ultrasound images (except ED bedside ultrasound) are read by the radiologist, see reports below, unless otherwise noted in MDM or here.  No orders to display     LABS: All lab results were reviewed by myself, and all abnormals are listed below.  Labs Reviewed   POC GLUCOSE FINGERSTICK - Abnormal; Notable for the following components:       Result Value    POC Glucose 156 (*)     All other components within normal limits   POC GLUCOSE FINGERSTICK - Abnormal; Notable for the following components:    POC Glucose 129 (*)     All other components within normal limits   POC GLUCOSE FINGERSTICK - Abnormal; Notable for the following components:    POC Glucose 128 (*)     All other components within normal limits   POCT GLUCOSE       PASTMEDICAL HISTORY     Past Medical  History:   Diagnosis Date   ??? Bipolar 1 disorder (HCC)    ??? Depression    ??? Diabetes mellitus (HCC)    ??? Hyperlipidemia    ??? IBS (irritable bowel syndrome)    ??? PCOS (polycystic ovarian syndrome)    ??? PMR (polymyalgia rheumatica) (HCC)      SURGICAL HISTORY       Past Surgical History:   Procedure Laterality Date   ??? APPENDECTOMY     ??? CHOLECYSTECTOMY     ??? TONSILLECTOMY       CURRENT MEDICATIONS       Discharge Medication List as of 11/01/2017  8:40 PM      CONTINUE these medications which have NOT CHANGED    Details   predniSONE (DELTASONE) 10 MG tablet Take 10 mg by mouth daily On 05/30/17 pt states started 4 days ago for "PMR"Historical Med      Magic Mouthwash (MIRACLE MOUTHWASH) Swish and spit 5 mLs 4 times daily as needed for Irritation, Disp-240 mL, R-0Print      ibuprofen (ADVIL;MOTRIN) 800 MG tablet Take 800 mg by mouthHistorical Med      valACYclovir (VALTREX) 500 MG  tablet Take 500 mg by mouthHistorical Med      diphenhydrAMINE (BENADRYL) 25 MG tablet Take 25 mg by mouth every 6 hours as needed for ItchingHistorical Med      desvenlafaxine succinate (PRISTIQ) 50 MG TB24 extended release tablet Take 25 mg by mouth dailyHistorical Med      furosemide (LASIX) 20 MG tablet Take 1 tablet by mouth daily, Disp-10 tablet, R-0Print      NONFORMULARY Take 2 tablets by mouth daily OTC supplement GABA + Theanine dissolving tabletsHistorical Med      fenofibrate 160 MG tablet Take 160 mg by mouth daily Historical Med      Liraglutide (VICTOZA) 18 MG/3ML SOPN SC injection Inject 1.2 mg into the skin daily Historical Med      Insulin Degludec (TRESIBA FLEXTOUCH) 200 UNIT/ML SOPN Inject 50 Units into the skin every morning Historical Med      ARIPiprazole (ABILIFY) 15 MG tablet Take 15 mg by mouth daily Historical Med      Omega 3 1000 MG CAPS Take 1,000 mg by mouth daily Historical Med      canagliflozin (INVOKANA) 300 MG TABS tablet Take 300 mg by mouth every morning (before breakfast)           ALLERGIES     has No Known  Allergies.  FAMILY HISTORY     has no family status information on file.      SOCIAL HISTORY       Social History     Tobacco Use   ??? Smoking status: Never Smoker   ??? Smokeless tobacco: Never Used   Substance Use Topics   ??? Alcohol use: Yes     Comment: socially   ??? Drug use: No       I personally evaluated and examined the patient in conjunction with the APC and agree with the assessment, treatment plan, and disposition of the patient as recorded by the APC.   Barbee CoughNabil J. Adri Schloss, MD  Attending Emergency Physician          Josem KaufmannNabil Leatta Alewine, MD  11/01/17 [phone]

## 2017-11-01 NOTE — ED Notes (Signed)
Pt states she feels good and is wanting to go home      Merton Borderyler J Yzabella Crunk  11/01/17 2042

## 2017-11-02 LAB — POC GLUCOSE FINGERSTICK: POC Glucose: 128 mg/dL — ABNORMAL HIGH (ref 65–105)

## 2017-12-01 ENCOUNTER — Emergency Department: Admit: 2017-12-01 | Payer: PRIVATE HEALTH INSURANCE | Primary: Nurse Practitioner

## 2017-12-01 ENCOUNTER — Inpatient Hospital Stay
Admit: 2017-12-01 | Discharge: 2017-12-02 | Disposition: A | Discharge status: Home / Self Care | Payer: PRIVATE HEALTH INSURANCE | Attending: Emergency Medicine

## 2017-12-01 DIAGNOSIS — R0789 Other chest pain: Secondary | ICD-10-CM

## 2017-12-01 MED ORDER — SODIUM CHLORIDE 0.9 % IV BOLUS
0.9 % | Freq: Once | INTRAVENOUS | Status: AC
Start: 2017-12-01 — End: 2017-12-01
  Administered 2017-12-02: 500 mL via INTRAVENOUS

## 2017-12-01 MED ORDER — FENTANYL CITRATE (PF) 100 MCG/2ML IJ SOLN
100 MCG/2ML | Freq: Once | INTRAMUSCULAR | Status: AC
Start: 2017-12-01 — End: 2017-12-01
  Administered 2017-12-02: 50 ug via INTRAVENOUS

## 2017-12-01 MED ORDER — FAMOTIDINE 20 MG/2ML IV SOLN
20 MG/2ML | Freq: Once | INTRAVENOUS | Status: AC
Start: 2017-12-01 — End: 2017-12-01
  Administered 2017-12-02: 40 mg via INTRAVENOUS

## 2017-12-01 NOTE — ED Provider Notes (Signed)
[place] [facility]  eMERGENCY dEPARTMENT eNCOUnter      Pt Name: Carolyn MooreKara M Balingit  MRN: [id number]  Birthdate 10/31/1975  Date of evaluation: 12/01/2017  Provider: Fabian SharpKaren J Stacey Erwin, MD    CHIEF COMPLAINT       Chief Complaint   Patient presents with   ??? Chest Pain         HISTORY OF PRESENT ILLNESS  (Location/Symptom, Timing/Onset, Context/Setting, Quality, Duration, Modifying Factors, Severity.)   Carolyn Crane is a 42 y.o. female who presents to the emergency department via EMS for evaluation of chest pain.  Patient was at work [employer].  She is employed in [employer].  She denies any new exposures or change in activity pattern.  She states she developed precordial chest pressure.  Pain and pressure radiated to her left shoulder and down her arm.  She became short of breath.  She became lightheaded.  She states when she bent over to pick up an object she "almost passed out".  She then called paramedics for evaluation.  Patient has a history of significant hypertriglyceridemia.  She has had previous stress test results unknown.  She does have strong family history.  She states her diabetes has been relatively well controlled.      Nursing Notes were reviewed.    ALLERGIES     Compazine [prochlorperazine]; Reglan [metoclopramide]; and Zofran [ondansetron hcl]    CURRENT MEDICATIONS       Previous Medications    ARIPIPRAZOLE (ABILIFY) 15 MG TABLET    Take 15 mg by mouth daily     CANAGLIFLOZIN (INVOKANA) 300 MG TABS TABLET    Take 300 mg by mouth every morning (before breakfast)    DESVENLAFAXINE SUCCINATE (PRISTIQ) 50 MG TB24 EXTENDED RELEASE TABLET    Take 25 mg by mouth daily    DIPHENHYDRAMINE (BENADRYL) 25 MG TABLET    Take 25 mg by mouth every 6 hours as needed for Itching    FENOFIBRATE 160 MG TABLET    Take 160 mg by mouth daily     FUROSEMIDE (LASIX) 20 MG TABLET    Take 1 tablet by mouth daily    IBUPROFEN (ADVIL;MOTRIN) 800 MG TABLET    Take 800 mg by mouth    INSULIN DEGLUDEC (TRESIBA FLEXTOUCH)  200 UNIT/ML SOPN    Inject 50 Units into the skin every morning     LIRAGLUTIDE (VICTOZA) 18 MG/3ML SOPN SC INJECTION    Inject 1.2 mg into the skin daily     MAGIC MOUTHWASH (MIRACLE MOUTHWASH)    Swish and spit 5 mLs 4 times daily as needed for Irritation    NONFORMULARY    Take 2 tablets by mouth daily OTC supplement GABA + Theanine dissolving tablets    OMEGA 3 1000 MG CAPS    Take 1,000 mg by mouth daily     PREDNISONE (DELTASONE) 10 MG TABLET    Take 10 mg by mouth daily On 05/30/17 pt states started 4 days ago for "PMR"    VALACYCLOVIR (VALTREX) 500 MG TABLET    Take 500 mg by mouth       PAST MEDICAL HISTORY         Diagnosis Date   ??? Bipolar 1 disorder (HCC)    ??? Depression    ??? Diabetes mellitus (HCC)    ??? Hyperlipidemia    ??? IBS (irritable bowel syndrome)    ??? PCOS (polycystic ovarian syndrome)    ??? PMR (polymyalgia rheumatica) (HCC)  SURGICAL HISTORY           Procedure Laterality Date   ??? APPENDECTOMY     ??? CHOLECYSTECTOMY     ??? TONSILLECTOMY           FAMILY HISTORY     History reviewed. No pertinent family history.  No family status information on file.        SOCIAL HISTORY      reports that she has never smoked. She has never used smokeless tobacco. She reports that she drinks alcohol. She reports that she does not use drugs.    REVIEW OF SYSTEMS    (2-9 systems for level 4, 10 or more for level 5)     Review of Systems   All other systems reviewed and are negative.      Except as noted above the remainder of the review of systems was reviewed and negative.     PHYSICAL EXAM    (up to 7 for level 4, 8 or more for level 5)     Vitals:    12/01/17 2200 12/01/17 2215 12/01/17 2230 12/01/17 2245   BP: 108/62 111/65 112/71 (!) 109/57   Pulse: 71 63 63 73   Resp: 16 16 16 19    Temp:       SpO2: 95% 94% 95% 95%   Weight:       Height:           Physical exam reflects a well-nourished well-hydrated female.  She is afebrile with stable vital signs to her pulse ox of 95% on room air.  She is not hypoxic.   She is alert conversive and appropriate in behavior.  Integument Pink warm and dry.  No difficulty breathing speaking or swallowing.  Oral pharyngeal exam without lesion.  Trachea midline.  No cervical lymphadenopathy JVD or bruits.  Heart regular rate and rhythm normal S1-S2 no murmurs rubs or gallops.  Lungs are clear to auscultation without wheezes rales or rhonchi.  Abdomen mildly obese soft and nontender.  Extremities show no cyanosis clubbing or edema.  Integument without rash or lesion.  No neurovascular deficits are noted      DIAGNOSTIC RESULTS     EKG: All EKG's are interpreted by the Emergency Department Physician who either signs or Co-signs this chart in the absence of a cardiologist.    EKG demonstrates sinus rhythm in the 70s no ectopy or ischemic change noted.  I have no priors for comparison    RADIOLOGY:   Non-plain film images such as CT, Ultrasound and MRI are read by the radiologist. Plain radiographic images are visualized and preliminarily interpreted by the emergency physician with the below findings:    XR CHEST PORTABLE   Status: Final result   Order Providers     Authorizing Billing   Fabian Sharp, MD Rosaland Lao, MD      ??   Signed by     Signed Date/Time  Phone Pager   Reed Breech D 12/01/2017 16:10 [phone]    Reading Providers     Read Date Phone Pager   Reed Breech D Dec 01, 2017 [phone]    Radiation Dose Estimates     No radiation information found for this patient   Narrative   EXAMINATION:   ONE XRAY VIEW OF THE CHEST   ??   12/01/2017 7:58 pm   ??   COMPARISON:   Chest radiograph 01/10/2017   ??   HISTORY:  ORDERING SYSTEM PROVIDED HISTORY: Chest Pain   TECHNOLOGIST PROVIDED HISTORY:   Chest Pain   Reason for Exam: Pt states chest pain, jaw pain, sob today   Acuity: Acute   Type of Exam: Initial   ??   FINDINGS:   Clear lungs. ??No findings of pneumothorax or pleural effusion. ??Normal   mediastinal, hilar, and cardiac contours. ??No obvious acute fracture.  ??Joints   maintain anatomic alignment.   ??   ??   Impression   No acute findings in the chest.   ??   ??         Interpretation per the Radiologist below, if available at the time of this note:    XR CHEST PORTABLE   Final Result   No acute findings in the chest.               LABS:  Labs Reviewed   BASIC METABOLIC PANEL - Abnormal; Notable for the following components:       Result Value    Glucose 119 (*)     Bun/Cre Ratio 24 (*)     Sodium 134 (*)     Chloride 97 (*)     All other components within normal limits   SEDIMENTATION RATE - Abnormal; Notable for the following components:    Sed Rate 35 (*)     All other components within normal limits   TSH WITHOUT REFLEX - Abnormal; Notable for the following components:    TSH 5.26 (*)     All other components within normal limits   TROP/MYOGLOBIN - Abnormal; Notable for the following components:    Myoglobin <21 (*)     All other components within normal limits   POC GLUCOSE FINGERSTICK - Abnormal; Notable for the following components:    POC Glucose 130 (*)     All other components within normal limits   POCT GLUCOSE - Normal   BRAIN NATRIURETIC PEPTIDE   CBC WITH AUTO DIFFERENTIAL   D-DIMER, QUANTITATIVE   MAGNESIUM   T4, FREE   CK ISOENZYMES   TROP/MYOGLOBIN   CK ISOENZYMES   Results for ROGENIA, WERNTZ (MRN [id number]) as of 12/01/2017 20:32   Ref. Range 12/01/2017 19:54 12/01/2017 19:57 12/01/2017 19:58 12/01/2017 20:07   Sodium Latest Ref Range: 135 - 144 mmol/L 134 (L)      Potassium Latest Ref Range: 3.7 - 5.3 mmol/L 4.4      Chloride Latest Ref Range: 98 - 107 mmol/L 97 (L)      CO2 Latest Ref Range: 20 - 31 mmol/L 24      BUN Latest Ref Range: 6 - 20 mg/dL 18      Creatinine Latest Ref Range: 0.50 - 0.90 mg/dL 4.54      Bun/Cre Ratio Latest Ref Range: 9 - 20  24 (H)      Anion Gap Latest Ref Range: 9 - 17 mmol/L 13      GFR Non-African American Latest Ref Range: >60 mL/min >60      GFR African American Latest Ref Range: >60 mL/min >60      Magnesium Latest Ref Range: 1.6  - 2.6 mg/dL 2.1      Glucose Latest Units: mg/dL 098 (H)   119   POC Glucose Latest Ref Range: 65 - 105 mg/dL  147 (H)     Calcium Latest Ref Range: 8.6 - 10.4 mg/dL 9.8      GFR Comment Unknown Pend      GFR  Staging Unknown NOT REPORTED      QC OK? Unknown    ok   Total CK Latest Ref Range: 26 - 192 U/L 96      % CKMB Latest Ref Range: 0.0 - 3.0 % 2.6      CK-MB Latest Ref Range: <5.4 ng/mL 2.5      CKMB Interpretation Unknown NORMAL ISOENZYME PATTERN      Myoglobin Latest Ref Range: 25 - 58 ng/mL 31      Pro-BNP Latest Ref Range: <300 pg/mL 66      Troponin T Latest Ref Range: <0.03 ng/mL NOT REPORTED      Troponin Interp Unknown NOT REPORTED      Troponin, High Sensitivity Latest Ref Range: 0 - 14 ng/L 6      BNP Interpretation Unknown Pro-BNP Reference Range:      TSH Latest Ref Range: 0.30 - 5.00 mIU/L 5.26 (H)      Thyroxine, Free Latest Ref Range: 0.93 - 1.70 ng/dL 5.78      WBC Latest Ref Range: 3.5 - 11.3 k/uL 7.7      RBC Latest Ref Range: 3.95 - 5.11 m/uL 4.17      Hemoglobin Quant Latest Ref Range: 11.9 - 15.1 g/dL 46.9      Hematocrit Latest Ref Range: 36.3 - 47.1 % 38.5      MCV Latest Ref Range: 82.6 - 102.9 fL 92.3      MCH Latest Ref Range: 25.2 - 33.5 pg 31.4      MCHC Latest Ref Range: 28.4 - 34.8 g/dL 62.9      MPV Latest Ref Range: 8.1 - 13.5 fL 9.8      RDW Latest Ref Range: 11.8 - 14.4 % 12.1      Platelet Count Latest Ref Range: 138 - 453 k/uL 407      Platelet Estimate Unknown NOT REPORTED      Absolute Mono # Latest Ref Range: 0.10 - 1.20 k/uL 0.53      Eosinophils % Latest Ref Range: 1 - 4 % 1      Basophils # Latest Ref Range: 0.00 - 0.20 k/uL 0.05      Differential Type Unknown NOT REPORTED      Seg Neutrophils Latest Ref Range: 36 - 65 % 64      Segs Absolute Latest Ref Range: 1.50 - 8.10 k/uL 4.94      Lymphocytes Latest Ref Range: 24 - 43 % 27      Absolute Lymph # Latest Ref Range: 1.10 - 3.70 k/uL 2.10      Monocytes Latest Ref Range: 3 - 12 % 7      Absolute Eos # Latest Ref Range:  0.00 - 0.44 k/uL 0.08      Basophils Latest Ref Range: 0 - 2 % 1      Immature Granulocytes Latest Ref Range: 0 % 0      WBC Morphology Unknown NOT REPORTED      RBC Morphology Unknown NOT REPORTED      D-Dimer, Quant Latest Units: mg/L FEU 0.19        Results for EVELYNA, FOLKER (MRN [id number]) as of 12/01/2017 21:18   Ref. Range 12/01/2017 19:54   Sed Rate Latest Ref Range: 0 - 20 mm 35 (H)   Results for MADALEE, ALTMANN (MRN [id number]) as of 12/01/2017 23:26   Ref. Range 12/01/2017 19:54 12/01/2017 19:57 12/01/2017 19:58 12/01/2017 20:07 12/01/2017 22:40   Total CK Latest  Ref Range: 26 - 192 U/L 96    77   % CKMB Latest Ref Range: 0.0 - 3.0 % 2.6    2.6   CK-MB Latest Ref Range: <5.4 ng/mL 2.5    2.0   CKMB Interpretation Unknown NORMAL ISOENZYME PATTERN    NORMAL ISOENZYME PATTERN   Myoglobin Latest Ref Range: 25 - 58 ng/mL 31    <21 (L)   Pro-BNP Latest Ref Range: <300 pg/mL 66       Troponin T Latest Ref Range: <0.03 ng/mL NOT REPORTED    NOT REPORTED   Troponin Interp Unknown NOT REPORTED    NOT REPORTED   Troponin, High Sensitivity Latest Ref Range: 0 - 14 ng/L 6    <6     All other labs were within normal range or not returned as of this dictation.    EMERGENCY DEPARTMENT COURSE and DIFFERENTIAL DIAGNOSIS/MDM:   Vitals:    Vitals:    12/01/17 2200 12/01/17 2215 12/01/17 2230 12/01/17 2245   BP: 108/62 111/65 112/71 (!) 109/57   Pulse: 71 63 63 73   Resp: 16 16 16 19    Temp:       SpO2: 95% 94% 95% 95%   Weight:       Height:         Patient is evaluated.  Initial EKG was without abnormality.  Due to risk factors serial enzymes are reviewed.  Serial enzymes are negative.  Patient has no further discomfort d-dimer negative she has no abnormal findings on exam or investigations.  Chest pain etiology is nonspecific.  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  She will follow-up with her family physician to discuss additional evaluation and  care.  She is aware to return to the ER with any worsening symptoms noted    CONSULTS:  None    PROCEDURES:  None    FINAL IMPRESSION      1. Atypical chest pain          DISPOSITION/PLAN   DISPOSITION Decision To Discharge 12/01/2017 11:26:45 PM      PATIENT REFERRED TO:   Stoney Bang, APRN - CNP  [address]  [place] [address]  [phone]    Schedule an appointment as soon as possible for a visit       [facility] ED  [address] [place] [address]  [phone]    As needed      DISCHARGE MEDICATIONS:     New Prescriptions    No medications on file     Controlled Substance Monitoring:    Acute and Chronic Pain Monitoring:   RX Monitoring 12/01/2017   Attestation -   Periodic Controlled Substance Monitoring Obtaining appropriate analgesic effect of treatment.         (Please note that portions of this note were completed with a voice recognition program.  Efforts were made to edit the dictations but occasionally words are mis-transcribed.)    Fabian Sharp, MD  Attending Emergency Physician         Fabian Sharp, MD  12/02/17 [phone]

## 2017-12-01 NOTE — ED Notes (Addendum)
Pt arrived to ED via EMS with c/o chest pain that started this evening while at work. 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  Pt states she was having chest pain, left arm pain, and jaw pain.  Per EMS pt was given nitro and aspirin in route to hospital. Pt is alert and oriented x4. Afebrile and vitals are stable. Non-diaphoretic.  Even, non-labored breathing. States pain was 7/10 but after EMS meds 4/10. Placed on telemetry,will continue to monitor.           Sherren MochaAmanda K Stopera, RN  12/01/17 2034

## 2017-12-01 NOTE — ED Notes (Signed)
Bed: 23  Expected date:   Expected time:   Means of arrival:   Comments:  42 yr old CP     Carolyn Crane, [place]  12/01/17 1941

## 2017-12-02 LAB — CK ISOENZYMES
% CKMB: 2.6 % (ref 0.0–3.0)
% CKMB: 2.6 % (ref 0.0–3.0)
CK-MB: 2.5 ng/mL (ref <5.4)
CK-MB: 2 ng/mL (ref <5.4)
CKMB Interpretation: NORMAL
CKMB Interpretation: NORMAL
Total CK: 96 U/L (ref 26–192)
Total CK: 77 U/L (ref 26–192)

## 2017-12-02 LAB — CBC WITH AUTO DIFFERENTIAL
Absolute Eos #: 0.08 10*3/uL (ref 0.00–0.44)
Absolute Immature Granulocyte: 0.03 10*3/uL (ref 0.00–0.30)
Absolute Lymph #: 2.1 10*3/uL (ref 1.10–3.70)
Absolute Mono #: 0.53 10*3/uL (ref 0.10–1.20)
Basophils Absolute: 0.05 10*3/uL (ref 0.00–0.20)
Basophils: 1 % (ref 0–2)
Eosinophils %: 1 % (ref 1–4)
Hematocrit: 38.5 % (ref 36.3–47.1)
Hemoglobin: 13.1 g/dL (ref 11.9–15.1)
Immature Granulocytes: 0 %
Lymphocytes: 27 % (ref 24–43)
MCH: 31.4 pg (ref 25.2–33.5)
MCHC: 34 g/dL (ref 28.4–34.8)
MCV: 92.3 fL (ref 82.6–102.9)
MPV: 9.8 fL (ref 8.1–13.5)
Monocytes: 7 % (ref 3–12)
NRBC Automated: 0 per 100 WBC
Platelets: 407 10*3/uL (ref 138–453)
RBC: 4.17 m/uL (ref 3.95–5.11)
RDW: 12.1 % (ref 11.8–14.4)
Seg Neutrophils: 64 % (ref 36–65)
Segs Absolute: 4.94 10*3/uL (ref 1.50–8.10)
WBC: 7.7 10*3/uL (ref 3.5–11.3)

## 2017-12-02 LAB — T4, FREE: Thyroxine, Free: 1.01 ng/dL (ref 0.93–1.70)

## 2017-12-02 LAB — BASIC METABOLIC PANEL
Anion Gap: 13 mmol/L (ref 9–17)
BUN: 18 mg/dL (ref 6–20)
Bun/Cre Ratio: 24 — ABNORMAL HIGH (ref 9–20)
CO2: 24 mmol/L (ref 20–31)
Calcium: 9.8 mg/dL (ref 8.6–10.4)
Chloride: 97 mmol/L — ABNORMAL LOW (ref 98–107)
Creatinine: 0.76 mg/dL (ref 0.50–0.90)
GFR African American: >60 mL/min (ref >60)
GFR Non-African American: >60 mL/min (ref >60)
Glucose: 119 mg/dL — ABNORMAL HIGH (ref 70–99)
Potassium: 4.4 mmol/L (ref 3.7–5.3)
Sodium: 134 mmol/L — ABNORMAL LOW (ref 135–144)

## 2017-12-02 LAB — EKG 12-LEAD
Atrial Rate: 77 {beats}/min
P Axis: 46 degrees
P-R Interval: 148 ms
Q-T Interval: 364 ms
QRS Duration: 86 ms
QTc Calculation (Bazett): 411 ms
R Axis: 39 degrees
T Axis: 37 degrees
Ventricular Rate: 77 {beats}/min

## 2017-12-02 LAB — TROP/MYOGLOBIN
Myoglobin: 31 ng/mL (ref 25–58)
Myoglobin: <21 ng/mL — ABNORMAL LOW (ref 25–58)
Troponin, High Sensitivity: 6 ng/L (ref 0–14)
Troponin, High Sensitivity: <6 ng/L (ref 0–14)

## 2017-12-02 LAB — D-DIMER, QUANTITATIVE: D-Dimer, Quant: 0.19 mg/L FEU

## 2017-12-02 LAB — BRAIN NATRIURETIC PEPTIDE: Pro-BNP: 66 pg/mL (ref <300)

## 2017-12-02 LAB — TSH: TSH: 5.26 mIU/L — ABNORMAL HIGH (ref 0.30–5.00)

## 2017-12-02 LAB — POCT GLUCOSE: Glucose: 142 mg/dL

## 2017-12-02 LAB — SEDIMENTATION RATE: Sed Rate: 35 mm — ABNORMAL HIGH (ref 0–20)

## 2017-12-02 LAB — POC GLUCOSE FINGERSTICK: POC Glucose: 130 mg/dL — ABNORMAL HIGH (ref 65–105)

## 2017-12-02 LAB — MAGNESIUM: Magnesium: 2.1 mg/dL (ref 1.6–2.6)

## 2017-12-02 MED FILL — FAMOTIDINE 20 MG/2ML IV SOLN: 20 MG/2ML | INTRAVENOUS | Qty: 4

## 2017-12-02 MED FILL — FENTANYL CITRATE (PF) 100 MCG/2ML IJ SOLN: 100 MCG/2ML | INTRAMUSCULAR | Qty: 2

## 2017-12-14 ENCOUNTER — Inpatient Hospital Stay
Admit: 2017-12-14 | Discharge: 2017-12-14 | Disposition: A | Discharge status: Home / Self Care | Payer: PRIVATE HEALTH INSURANCE | Attending: Emergency Medicine

## 2017-12-14 DIAGNOSIS — R5383 Other fatigue: Secondary | ICD-10-CM

## 2017-12-14 LAB — CBC WITH AUTO DIFFERENTIAL
Basophils %: 1 % (ref 0–2)
Basophils Absolute: 0.03 10*3/uL (ref 0.00–0.20)
Eosinophils %: 2 % (ref 1–4)
Eosinophils Absolute: 0.11 10*3/uL (ref 0.00–0.44)
Hematocrit: 43 % (ref 36.3–47.1)
Hemoglobin: 14.5 g/dL (ref 11.9–15.1)
Immature Granulocytes %: 0 %
Immature Granulocytes Absolute: 0.01 10*3/uL (ref 0.00–0.30)
Lymphocytes Absolute: 1.98 10*3/uL (ref 1.10–3.70)
Lymphocytes: 30 % (ref 24–43)
MCH: 30.9 pg (ref 25.2–33.5)
MCHC: 33.7 g/dL (ref 28.4–34.8)
MCV: 91.7 fL (ref 82.6–102.9)
MPV: 9 fL (ref 8.1–13.5)
Monocytes %: 8 % (ref 3–12)
Monocytes Absolute: 0.51 10*3/uL (ref 0.10–1.20)
NRBC Automated: 0 per 100 WBC
Neutrophils Absolute: 3.94 10*3/uL (ref 1.50–8.10)
Platelets: 375 10*3/uL (ref 138–453)
RBC: 4.69 m/uL (ref 3.95–5.11)
RDW: 12.1 % (ref 11.8–14.4)
Seg Neutrophils: 59 % (ref 36–65)
WBC: 6.6 10*3/uL (ref 3.5–11.3)

## 2017-12-14 LAB — BASIC METABOLIC PANEL
Anion Gap: 14 mmol/L (ref 9–17)
BUN/Creatinine Ratio: 18 (ref 9–20)
BUN: 11 mg/dL (ref 6–20)
CO2: 25 mmol/L (ref 20–31)
Calcium: 9.6 mg/dL (ref 8.6–10.4)
Chloride: 97 mmol/L — ABNORMAL LOW (ref 98–107)
Creatinine: 0.61 mg/dL (ref 0.50–0.90)
GFR African American: >60 mL/min (ref >60)
GFR Non-African American: >60 mL/min (ref >60)
Glucose: 159 mg/dL — ABNORMAL HIGH (ref 70–99)
Potassium: 4 mmol/L (ref 3.7–5.3)
Sodium: 136 mmol/L (ref 135–144)

## 2017-12-14 LAB — POCT URINALYSIS DIPSTICK
Bilirubin, UA: NEGATIVE
Blood, UA POC: NEGATIVE
Glucose, UA POC: 250
Ketones, UA: NEGATIVE
Leukocytes, UA: NEGATIVE
Nitrite, UA: NEGATIVE
Protein, UA POC: NEGATIVE
Spec Grav, UA: 1.02
Urobilinogen, UA: 0.2
pH, UA: 6

## 2017-12-14 LAB — POCT URINE PREGNANCY: Preg Test, Ur: NEGATIVE

## 2017-12-14 MED ORDER — SODIUM CHLORIDE 0.9 % IV BOLUS
0.9 % | Freq: Once | INTRAVENOUS | Status: AC
Start: 2017-12-14 — End: 2017-12-14
  Administered 2017-12-14: 23:00:00 1000 mL via INTRAVENOUS

## 2017-12-14 NOTE — ED Notes (Signed)
Patient c/o increased fatigue for 2 days.       Carolyn HaywardVirginia D Akoni Parton, RN  12/14/17 [phone]

## 2017-12-14 NOTE — ED Provider Notes (Signed)
[place] [facility] ED  eMERGENCY dEPARTMENT eNCOUnter      Pt Name: Carolyn Crane  MRN: [id number]  Birthdate [birth year]  Date of evaluation: 12/14/2017  Provider: Eudelia Bunch, APRN - CNP    CHIEF COMPLAINT       Chief Complaint   Patient presents with   ??? Fatigue     x 2 days & worsening         HISTORY OFPRESENT ILLNESS  (Location/Symptom, Timing/Onset, Context/Setting, Quality, Duration, Modifying Factors, Severity.)   Carolyn Crane is a 42 y.o. female who presents to the emergency department for evaluation of fatigue and urinary frequency for the past 2 days.  She also complains of mild headache today which she states is typical for her.  No visual disturbance.  She denies chest pain, cough, shortness of breath or abdominal pain.  No nausea or vomiting.      Nursing Notes were reviewed.    PASTMEDICAL HISTORY     Past Medical History:   Diagnosis Date   ??? Bipolar 1 disorder (HCC)    ??? Depression    ??? Diabetes mellitus (HCC)    ??? Hyperlipidemia    ??? IBS (irritable bowel syndrome)    ??? PCOS (polycystic ovarian syndrome)    ??? PMR (polymyalgia rheumatica) (HCC)          SURGICAL HISTORY       Past Surgical History:   Procedure Laterality Date   ??? APPENDECTOMY     ??? CHOLECYSTECTOMY     ??? TONSILLECTOMY           CURRENT MEDICATIONS     Current Discharge Medication List      CONTINUE these medications which have NOT CHANGED    Details   propranolol (INDERAL LA) 60 MG extended release capsule Take 60 mg by mouth daily      Semaglutide (OZEMPIC SC) Inject into the skin      butalbital-acetaminophen-caffeine (FIORICET, ESGIC) 50-325-40 MG per tablet Take 1 tablet by mouth every 4 hours as needed for Headaches      ibuprofen (ADVIL;MOTRIN) 800 MG tablet Take 800 mg by mouth      valACYclovir (VALTREX) 500 MG tablet Take 500 mg by mouth      fenofibrate 160 MG tablet Take 160 mg by mouth daily       Insulin Degludec (TRESIBA FLEXTOUCH) 200 UNIT/ML SOPN Inject 50 Units into the skin every morning       ARIPiprazole (ABILIFY) 15  MG tablet Take 15 mg by mouth daily       NONFORMULARY Take 2 tablets by mouth daily OTC supplement GABA + Theanine dissolving tablets             ALLERGIES     Compazine [prochlorperazine]; Reglan [metoclopramide]; and Zofran [ondansetron hcl]    FAMILY HISTORY     History reviewed. No pertinent family history.       SOCIAL HISTORY       Social History     Socioeconomic History   ??? Marital status: Divorced     Spouse name: None   ??? Number of children: None   ??? Years of education: None   ??? Highest education level: None   Occupational History   ??? None   Social Needs   ??? Financial resource strain: None   ??? Food insecurity:     Worry: None     Inability: None   ??? Transportation needs:     Medical: None  Non-medical: None   Tobacco Use   ??? Smoking status: Never Smoker   ??? Smokeless tobacco: Never Used   Substance and Sexual Activity   ??? Alcohol use: Yes     Comment: socially   ??? Drug use: No   ??? Sexual activity: None   Lifestyle   ??? Physical activity:     Days per week: None     Minutes per session: None   ??? Stress: None   Relationships   ??? Social connections:     Talks on phone: None     Gets together: None     Attends religious service: None     Active member of club or organization: None     Attends meetings of clubs or organizations: None     Relationship status: None   ??? Intimate partner violence:     Fear of current or ex partner: None     Emotionally abused: None     Physically abused: None     Forced sexual activity: None   Other Topics Concern   ??? None   Social History Narrative   ??? None         REVIEW OF SYSTEMS    (2-9 systems for level 4, 10 or more for level 5)     Review of Systems   Constitutional: Positive for fatigue. Negative for fever.   Eyes: Negative for photophobia and visual disturbance.   Respiratory: Negative for cough and shortness of breath.    Cardiovascular: Negative for chest pain.   Gastrointestinal: Negative for abdominal pain, nausea and vomiting.   Genitourinary: Positive for  frequency.   Musculoskeletal: Negative for back pain.   Neurological: Positive for headaches. Negative for dizziness.   All other systems reviewed and are negative.    Except as noted above the remainder of the review of systems was reviewed and negative.     PHYSICAL EXAM    (up to 7 for level 4, 8 or more for level 5)     ED Triage Vitals [12/14/17 1759]   BP Temp Temp Source Pulse Resp SpO2 Height Weight   124/78 98.2 ??F (36.8 ??C) Oral 79 18 97 % 5\' 5"  (1.651 m) 206 lb 4.8 oz (93.6 kg)       Physical Exam   Constitutional: She is oriented to person, place, and time. She appears well-developed and well-nourished.   HENT:   Head: Normocephalic and atraumatic.   Right Ear: Hearing and external ear normal.   Left Ear: Hearing and external ear normal.   Nose: Nose normal.   Mouth/Throat: Uvula is midline, oropharynx is clear and moist and mucous membranes are normal.   Eyes: Pupils are equal, round, and reactive to light. Conjunctivae, EOM and lids are normal.   Neck: Normal range of motion and full passive range of motion without pain. Neck supple.   Cardiovascular: Normal rate, regular rhythm, S1 normal, S2 normal, normal heart sounds, intact distal pulses and normal pulses.   Pulmonary/Chest: Effort normal and breath sounds normal.   Abdominal: Soft. Normal appearance and bowel sounds are normal. There is no tenderness.   Musculoskeletal: Normal range of motion.   Neurological: She is alert and oriented to person, place, and time. She has normal strength. No cranial nerve deficit or sensory deficit. GCS eye subscore is 4. GCS verbal subscore is 5. GCS motor subscore is 6.   Skin: Skin is warm. Capillary refill takes less than 2 seconds.   Psychiatric: She  has a normal mood and affect. Her behavior is normal. Judgment and thought content normal.         DIAGNOSTIC RESULTS     EKG:All EKG's are interpreted by the Emergency Department Physician who either signs or Co-signs this chart in the absence of a  cardiologist.        RADIOLOGY:   Non-plain film images such as CT, Ultrasound and MRI are read by theradiologist. Plain radiographic images are visualized and preliminarily interpreted by the emergency physician with the below findings:        Interpretation per the Radiologist below, if available at the time of this note:    No orders to display         EDBEDSIDE ULTRASOUND:   Performed by EDPhysician - none    LABS:  @EDLABS @    All other labs were within normal range or not returned as of this dictation.    EMERGENCY DEPARTMENT COURSE andDIFFERENTIAL DIAGNOSIS/MDM:   The patient was evaluated in conjunction with attending physician.  Pregnancy test negative.  Urinalysis does not show infection.  Labs reviewed.  Mucus membranes are dry.  She was given 1 L of normal saline in the ED.  Patient was discharged home instructed to follow-up with your primary care doctor.      Vitals:    Vitals:    12/14/17 1759   BP: 124/78   Pulse: 79   Resp: 18   Temp: 98.2 ??F (36.8 ??C)   TempSrc: Oral   SpO2: 97%   Weight: 206 lb 4.8 oz (93.6 kg)   Height: 5\' 5"  (1.651 m)         CONSULTS:  None    PROCEDURES:  Procedures    FINAL IMPRESSION      1. Fatigue, unspecified type          DISPOSITION/PLAN   DISPOSITION Decision To Discharge 12/14/2017 07:31:31 PM      PATIENT REFERRED TO:   Stoney BangJoanna Shaw, APRN - CNP  [address]  [place] [id number]  [phone]    In 1 week      [facility]  [address]  [place] [id number]  [phone]    As needed      DISCHARGE MEDICATIONS:     Current Discharge Medication List        Electronically signed by Eudelia BunchEric Phil Corti, APRN - CNPon 12/14/2017 at 7:49 PM            Eudelia BunchEric Emelina Hinch, APRN - CNP  12/14/17 1950

## 2017-12-14 NOTE — ED Provider Notes (Signed)
The patient was seen and examined by me in conjunction with the mid-level provider.  I agree with his/her assessment and treatment plan.    The patient's work-up is negative here.  She will be released home and will call her PCP in the morning.  Treatment diagnosis and follow-up were discussed with the patient.     Cherie OuchJeffery Kellee Sittner, MD  12/14/17 1932

## 2017-12-16 LAB — POCT URINE PREGNANCY: HCG, Pregnancy Urine (POC): NEGATIVE

## 2018-02-16 DIAGNOSIS — R112 Nausea with vomiting, unspecified: Secondary | ICD-10-CM

## 2018-02-16 NOTE — ED Notes (Signed)
Pt presents with c/o emesis, diarrhea and headache. States her symptoms started 2 days ago. Pt has been taking Imodium at home with no relief. Rates pain 8/10.     Ronney AstersLogan P Adonna Horsley, RN  02/16/18 2350

## 2018-02-16 NOTE — ED Provider Notes (Signed)
[facility] ED  eMERGENCY dEPARTMENT eNCOUnter      Pt Name: Carolyn Crane  MRN: [id number]  Birthdate 08-12-1975  Date of evaluation: 02/16/2018  Provider: Celisa Schoenberg Gerarda Fraction NP, APRN - CNP    CHIEF COMPLAINT       Chief Complaint   Patient presents with   ??? Emesis   ??? Diarrhea   ??? Headache         HISTORY OF PRESENT ILLNESS  (Location/Symptom, Timing/Onset, Context/Setting, Quality, Duration, Modifying Factors, Severity.)   Carolyn Crane is a 42 y.o. female who presents to the emergency department by private vehicle for evaluation of nausea vomiting and diarrhea.  Patient states for last 2 days she has had nausea vomiting and diarrhea.  She has been taking Imodium for diarrhea which has slightly improved.  She also has a headache with some burning in her chest from acid reflux.  She states that her nephew who she saw over the weekend was sick with similar symptoms      Nursing Notes were reviewed.    ALLERGIES     Compazine [prochlorperazine]; Reglan [metoclopramide]; and Zofran [ondansetron hcl]    CURRENT MEDICATIONS       Discharge Medication List as of 02/17/2018 12:37 AM      CONTINUE these medications which have NOT CHANGED    Details   escitalopram (LEXAPRO) 10 MG tablet Take 15 mg by mouth dailyHistorical Med      Liraglutide (VICTOZA SC) Inject into the skinHistorical Med      atorvastatin (LIPITOR) 80 MG tablet Take 80 mg by mouth dailyHistorical Med      Lansoprazole (PREVACID PO) Take by mouthHistorical Med      propranolol (INDERAL LA) 60 MG extended release capsule Take 60 mg by mouth dailyHistorical Med      ibuprofen (ADVIL;MOTRIN) 800 MG tablet Take 800 mg by mouthHistorical Med      valACYclovir (VALTREX) 500 MG tablet Take 500 mg by mouthHistorical Med      Insulin Degludec (TRESIBA FLEXTOUCH) 200 UNIT/ML SOPN Inject 50 Units into the skin every morning Historical Med      ARIPiprazole (ABILIFY) 15 MG tablet Take 15 mg by mouth daily Historical Med      butalbital-acetaminophen-caffeine  (FIORICET, ESGIC) 50-325-40 MG per tablet Take 1 tablet by mouth every 4 hours as needed for HeadachesHistorical Med      NONFORMULARY Take 2 tablets by mouth daily OTC supplement GABA + Theanine dissolving tabletsHistorical Med      fenofibrate 160 MG tablet Take 160 mg by mouth daily Historical Med             PAST MEDICAL HISTORY         Diagnosis Date   ??? Bipolar 1 disorder (HCC)    ??? Depression    ??? Diabetes mellitus (HCC)    ??? Hyperlipidemia    ??? IBS (irritable bowel syndrome)    ??? PCOS (polycystic ovarian syndrome)    ??? PMR (polymyalgia rheumatica) (HCC)        SURGICAL HISTORY           Procedure Laterality Date   ??? APPENDECTOMY     ??? CHOLECYSTECTOMY     ??? TONSILLECTOMY           FAMILY HISTORY     History reviewed. No pertinent family history.  No family status information on file.        SOCIAL HISTORY      reports that  she has never smoked. She has never used smokeless tobacco. She reports that she drinks alcohol. She reports that she does not use drugs.    REVIEW OF SYSTEMS    (2-9 systems for level 4, 10 or more for level 5)     Review of Systems   Constitutional: Negative for chills, fever and unexpected weight change.   HENT: Negative for congestion, rhinorrhea, sinus pressure and sore throat.    Respiratory: Negative for cough, shortness of breath and wheezing.    Cardiovascular: Negative for chest pain and palpitations.   Gastrointestinal: Positive for diarrhea and nausea. Negative for abdominal pain, constipation and vomiting.   Genitourinary: Negative for dysuria and hematuria.   Musculoskeletal: Negative for arthralgias and myalgias.   Skin: Negative for color change and rash.   Neurological: Negative for dizziness, weakness and headaches.   Hematological: Negative for adenopathy.        Except as noted above the remainder of the review of systems was reviewed and negative.     PHYSICAL EXAM    (up to 7 for level 4, 8 or more for level 5)     ED Triage Vitals [02/16/18 2221]   BP Temp Temp Source  Pulse Resp SpO2 Height Weight   121/71 98.5 ??F (36.9 ??C) Oral 82 18 99 % 5\' 5"  (1.651 m) 209 lb 3.2 oz (94.9 kg)       Physical Exam   Constitutional: She is oriented to person, place, and time. She appears well-developed and well-nourished.   HENT:   Head: Normocephalic and atraumatic.   Mouth/Throat: Oropharynx is clear and moist.   Eyes: Pupils are equal, round, and reactive to light. Conjunctivae are normal.   Neck: Normal range of motion. Neck supple.   Cardiovascular: Normal rate and regular rhythm.   Pulmonary/Chest: Effort normal and breath sounds normal. No stridor. No respiratory distress.   Abdominal: Soft. Bowel sounds are normal.   Musculoskeletal: Normal range of motion.   Lymphadenopathy:     She has no cervical adenopathy.   Neurological: She is alert and oriented to person, place, and time.   Skin: Skin is warm and dry. No rash noted.   Psychiatric: She has a normal mood and affect.   Vitals reviewed.    LABS:  Labs Reviewed   CBC WITH AUTO DIFFERENTIAL - Abnormal; Notable for the following components:       Result Value    Hematocrit 35.7 (*)     RDW 11.6 (*)     All other components within normal limits   BASIC METABOLIC PANEL - Abnormal; Notable for the following components:    Glucose 201 (*)     All other components within normal limits   URINE RT REFLEX TO CULTURE - Abnormal; Notable for the following components:    Turbidity UA SLIGHTLY CLOUDY (*)     Glucose, Ur 3+ (*)     All other components within normal limits   MICROSCOPIC URINALYSIS - Abnormal; Notable for the following components:    Crystals UA 0 TO 2 AMMONIUM URATE (*)     Bacteria, UA MODERATE (*)     All other components within normal limits   URINE CULTURE CLEAN CATCH       All other labs were within normal range or not returned as of this dictation.    EMERGENCY DEPARTMENT COURSE and DIFFERENTIAL DIAGNOSIS/MDM:   Vitals:    Vitals:    02/16/18 2221   BP: 121/71  Pulse: 82   Resp: 18   Temp: 98.5 ??F (36.9 ??C)   TempSrc: Oral    SpO2: 99%   Weight: 209 lb 3.2 oz (94.9 kg)   Height: 5\' 5"  (1.651 m)       Medical Decision Making: pt feels better. Able to be discharged home with symptomatic relief. Follow up with own MND  Medications   0.9 % sodium chloride bolus (0 mLs Intravenous Stopped 02/17/18 0018)   pantoprazole (PROTONIX) 80 mg in sodium chloride 0.9 % 50 mL bolus (0 mg Intravenous Stopped 02/16/18 2348)   ketorolac (TORADOL) injection 30 mg (30 mg Intravenous Given 02/16/18 2313)     FINAL IMPRESSION      1. Nausea and vomiting, intractability of vomiting not specified, unspecified vomiting type    2. Diarrhea, unspecified type    3. Acute nonintractable headache, unspecified headache type          DISPOSITION/PLAN   DISPOSITION Decision To Discharge 02/17/2018 12:36:25 AM      PATIENT REFERRED TO:   Stoney Bang, APRN - CNP  [address]  [place] [address]  [phone]    Schedule an appointment as soon as possible for a visit       [facility] ED  [address] [place] [address]  [phone]    If symptoms worsen      DISCHARGE MEDICATIONS:     Discharge Medication List as of 02/17/2018 12:37 AM      START taking these medications    Details   pantoprazole (PROTONIX) 20 MG tablet Take 2 tablets by mouth daily, Disp-30 tablet, R-0Print      HYDROcodone-acetaminophen (NORCO) 5-325 MG per tablet Take 1 tablet by mouth every 8 hours as needed for Pain for up to 2 days., Disp-6 tablet, R-0Print                 (Please note that portions of this note were completed with a voice recognition program.  Efforts were made to edit the dictations but occasionally words are mis-transcribed.)    Saraih Lorton Gerarda Fraction NP, APRN - CNP  Certified Nurse Practitioner            Cecille Rubin, APRN - CNP  02/17/18 0205

## 2018-02-17 ENCOUNTER — Inpatient Hospital Stay
Admit: 2018-02-17 | Discharge: 2018-02-17 | Disposition: A | Discharge status: Home / Self Care | Payer: PRIVATE HEALTH INSURANCE

## 2018-02-17 LAB — BASIC METABOLIC PANEL
Anion Gap: 12 mmol/L (ref 9–17)
BUN/Creatinine Ratio: 19 (ref 9–20)
BUN: 14 mg/dL (ref 6–20)
CO2: 25 mmol/L (ref 20–31)
Calcium: 8.9 mg/dL (ref 8.6–10.4)
Chloride: 99 mmol/L (ref 98–107)
Creatinine: 0.73 mg/dL (ref 0.50–0.90)
GFR African American: >60 mL/min (ref >60)
GFR Non-African American: >60 mL/min (ref >60)
Glucose: 201 mg/dL — ABNORMAL HIGH (ref 70–99)
Potassium: 4.3 mmol/L (ref 3.7–5.3)
Sodium: 136 mmol/L (ref 135–144)

## 2018-02-17 LAB — URINALYSIS WITH REFLEX TO CULTURE
Bilirubin Urine: NEGATIVE
Ketones, Urine: NEGATIVE
Leukocyte Esterase, Urine: NEGATIVE
Nitrite, Urine: NEGATIVE
Protein, UA: NEGATIVE
Specific Gravity, UA: 1.029 (ref 1.005–1.030)
Urine Hgb: NEGATIVE
Urobilinogen, Urine: NORMAL
pH, UA: 5.5 (ref 5.0–8.0)

## 2018-02-17 LAB — CBC WITH AUTO DIFFERENTIAL
Basophils %: 1 % (ref 0–2)
Basophils Absolute: 0.04 10*3/uL (ref 0.00–0.20)
Eosinophils %: 2 % (ref 1–4)
Eosinophils Absolute: 0.12 10*3/uL (ref 0.00–0.44)
Hematocrit: 35.7 % — ABNORMAL LOW (ref 36.3–47.1)
Hemoglobin: 12.4 g/dL (ref 11.9–15.1)
Immature Granulocytes %: 0 %
Immature Granulocytes Absolute: 0.02 10*3/uL (ref 0.00–0.30)
Lymphocytes Absolute: 1.9 10*3/uL (ref 1.10–3.70)
Lymphocytes: 25 % (ref 24–43)
MCH: 31.2 pg (ref 25.2–33.5)
MCHC: 34.7 g/dL (ref 28.4–34.8)
MCV: 89.9 fL (ref 82.6–102.9)
MPV: 10.4 fL (ref 8.1–13.5)
Monocytes %: 8 % (ref 3–12)
Monocytes Absolute: 0.57 10*3/uL (ref 0.10–1.20)
NRBC Automated: 0 per 100 WBC
Neutrophils Absolute: 4.96 10*3/uL (ref 1.50–8.10)
Platelets: 394 10*3/uL (ref 138–453)
RBC: 3.97 m/uL (ref 3.95–5.11)
RDW: 11.6 % — ABNORMAL LOW (ref 11.8–14.4)
Seg Neutrophils: 64 % (ref 36–65)
WBC: 7.6 10*3/uL (ref 3.5–11.3)

## 2018-02-17 LAB — MICROSCOPIC URINALYSIS
Crystals, UA: 0 /HPF — AB
Epithelial Cells, UA: 50 /[HPF] (ref 0–5)
WBC, UA: 2 /HPF (ref 0–5)

## 2018-02-17 MED ORDER — PANTOPRAZOLE SODIUM 40 MG IV SOLR
40 MG | Freq: Once | INTRAVENOUS | Status: AC
Start: 2018-02-17 — End: 2018-02-16
  Administered 2018-02-17: 03:00:00 80 mg via INTRAVENOUS

## 2018-02-17 MED ORDER — PANTOPRAZOLE SODIUM 20 MG PO TBEC
20 MG | ORAL_TABLET | Freq: Every day | ORAL | 0 refills | Status: DC
Start: 2018-02-17 — End: 2018-09-08

## 2018-02-17 MED ORDER — HYDROCODONE-ACETAMINOPHEN 5-325 MG PO TABS
5-325 MG | ORAL_TABLET | Freq: Three times a day (TID) | ORAL | 0 refills | Status: AC | PRN
Start: 2018-02-17 — End: 2018-02-19

## 2018-02-17 MED ORDER — KETOROLAC TROMETHAMINE 30 MG/ML IJ SOLN
30 MG/ML | Freq: Once | INTRAMUSCULAR | Status: AC
Start: 2018-02-17 — End: 2018-02-16
  Administered 2018-02-17: 03:00:00 30 mg via INTRAVENOUS

## 2018-02-17 MED ORDER — SODIUM CHLORIDE 0.9 % IV BOLUS
0.9 % | Freq: Once | INTRAVENOUS | Status: AC
Start: 2018-02-17 — End: 2018-02-17
  Administered 2018-02-17: 03:00:00 1000 mL via INTRAVENOUS

## 2018-02-17 MED FILL — KETOROLAC TROMETHAMINE 30 MG/ML IJ SOLN: 30 mg/mL | INTRAMUSCULAR | Qty: 1

## 2018-02-17 MED FILL — PROTONIX 40 MG IV SOLR: 40 mg | INTRAVENOUS | Qty: 80

## 2018-02-18 LAB — URINE CULTURE CLEAN CATCH

## 2018-02-28 ENCOUNTER — Inpatient Hospital Stay
Admit: 2018-02-28 | Discharge: 2018-03-01 | Disposition: A | Discharge status: Home / Self Care | Payer: PRIVATE HEALTH INSURANCE | Attending: Emergency Medicine

## 2018-02-28 DIAGNOSIS — R0789 Other chest pain: Secondary | ICD-10-CM

## 2018-02-28 NOTE — ED Provider Notes (Signed)
EMERGENCY DEPARTMENT ENCOUNTER    Pt Name: Carolyn Crane  MRN: [id number]  Birthdate [birth year]  Date of evaluation: 02/28/18  CHIEF COMPLAINT       Chief Complaint   Patient presents with   ??? Chest Pain     HISTORY OF PRESENT ILLNESS   HPI     42 year old female presents to the ED for evaluation of chest pain x1 hour.  Patient complains of substernal chest tightness.  Patient rated the pain at 8 out of 10.  Patient with no prior history of chest pain.  Patient denies shortness of breath diaphoresis.  Patient with no URI symptoms or cough.  Patient has not had anything for the pain.  Patient is not a smoker.  History of diabetes and hyperlipidemia.    REVIEW OF SYSTEMS     Review of Systems   All other systems reviewed and are negative.    PASTMEDICAL HISTORY     Past Medical History:   Diagnosis Date   ??? Bipolar 1 disorder (HCC)    ??? Depression    ??? Diabetes mellitus (HCC)    ??? Hyperlipidemia    ??? IBS (irritable bowel syndrome)    ??? PCOS (polycystic ovarian syndrome)    ??? PMR (polymyalgia rheumatica) (HCC)      SURGICAL HISTORY       Past Surgical History:   Procedure Laterality Date   ??? APPENDECTOMY     ??? CHOLECYSTECTOMY     ??? TONSILLECTOMY       CURRENT MEDICATIONS       Previous Medications    ARIPIPRAZOLE (ABILIFY) 15 MG TABLET    Take 15 mg by mouth daily     ATORVASTATIN (LIPITOR) 80 MG TABLET    Take 80 mg by mouth daily    BUTALBITAL-ACETAMINOPHEN-CAFFEINE (FIORICET, ESGIC) 50-325-40 MG PER TABLET    Take 1 tablet by mouth every 4 hours as needed for Headaches    ESCITALOPRAM (LEXAPRO) 10 MG TABLET    Take 15 mg by mouth daily    FENOFIBRATE 160 MG TABLET    Take 160 mg by mouth daily     IBUPROFEN (ADVIL;MOTRIN) 800 MG TABLET    Take 800 mg by mouth    INSULIN DEGLUDEC (TRESIBA FLEXTOUCH) 200 UNIT/ML SOPN    Inject 50 Units into the skin every morning     LANSOPRAZOLE (PREVACID PO)    Take by mouth    LIRAGLUTIDE (VICTOZA SC)    Inject into the skin    NONFORMULARY    Take 2 tablets by mouth daily OTC  supplement GABA + Theanine dissolving tablets    PANTOPRAZOLE (PROTONIX) 20 MG TABLET    Take 2 tablets by mouth daily    PROPRANOLOL (INDERAL LA) 60 MG EXTENDED RELEASE CAPSULE    Take 60 mg by mouth daily    VALACYCLOVIR (VALTREX) 500 MG TABLET    Take 500 mg by mouth     ALLERGIES     is allergic to compazine [prochlorperazine]; reglan [metoclopramide]; and zofran [ondansetron hcl].  FAMILY HISTORY     has no family status information on file.      SOCIAL HISTORY       Social History     Tobacco Use   ??? Smoking status: Never Smoker   ??? Smokeless tobacco: Never Used   Substance Use Topics   ??? Alcohol use: Yes     Comment: socially   ??? Drug use: No     PHYSICAL  EXAM     INITIAL VITALS:   Vitals:    02/28/18 1837 02/28/18 2016 02/28/18 2032 02/28/18 2053   BP: 129/75 114/78 116/77 114/63   Pulse: 70 66 64 68   Resp: 16 15 14 15    Temp: 98.7 ??F (37.1 ??C)      TempSrc: Oral      SpO2: 100% 99% 98% 98%   Weight: 209 lb (94.8 kg)      Height: 5\' 5"  (1.651 m)          Physical Exam   Constitutional: She is oriented to person, place, and time. She appears well-developed and well-nourished.   HENT:   Head: Normocephalic and atraumatic.   Eyes: Conjunctivae and EOM are normal.   Neck: Normal range of motion. Neck supple.   Cardiovascular: Normal rate and regular rhythm.   Pulmonary/Chest: Effort normal. She has wheezes.   Abdominal: Soft. She exhibits no mass. There is no tenderness.   Musculoskeletal: Normal range of motion. She exhibits no edema, tenderness or deformity.   Neurological: She is alert and oriented to person, place, and time.   Skin: Skin is warm and dry.       MEDICAL DECISION MAKING:     Evaluation of atypical chest pain.  Pain was onset an hour only prior to arrival.  For this reason we did to set of troponin with this patient were both negative.  Patient EKG was nondiagnostic.  Patient with heart score of 3.  Patient was pain-free all through this ED course.  Plan to have patient follow-up with primary  care for further evaluation including cardiology consult.     CRITICAL CARE:       PROCEDURES:    Procedures    DIAGNOSTIC RESULTS   EKG:All EKG's are interpreted by the Emergency Department Physician who either signs or Co-signs this chart in the absence of a cardiologist.    Sinus rhythm ventricular rate of 64 normal axis unremarkable ST segments    RADIOLOGY:All plain film, CT, MRI, and formal ultrasound images (except ED bedside ultrasound) are read by the radiologist, see reports below, unless otherwisenoted in MDM or here.  No orders to display     LABS: All lab results were reviewed by myself, and all abnormals are listed below.  Labs Reviewed   CBC WITH AUTO DIFFERENTIAL - Abnormal; Notable for the following components:       Result Value    Immature Granulocytes 1 (*)     All other components within normal limits   BASIC METABOLIC PANEL W/ REFLEX TO MG FOR LOW K - Abnormal; Notable for the following components:    Glucose 202 (*)     Bun/Cre Ratio 23 (*)     Sodium 132 (*)     Chloride 96 (*)     All other components within normal limits   TROPONIN   TROPONIN       EMERGENCY DEPARTMENTCOURSE:         Vitals:    Vitals:    02/28/18 1837 02/28/18 2016 02/28/18 2032 02/28/18 2053   BP: 129/75 114/78 116/77 114/63   Pulse: 70 66 64 68   Resp: 16 15 14 15    Temp: 98.7 ??F (37.1 ??C)      TempSrc: Oral      SpO2: 100% 99% 98% 98%   Weight: 209 lb (94.8 kg)      Height: 5\' 5"  (1.651 m)  The patient was given the following medications while in the emergency department:  Orders Placed This Encounter   Medications   ??? aspirin chewable tablet 324 mg   ??? nitroGLYCERIN (NITROSTAT) SL tablet 0.4 mg     CONSULTS:  None    FINAL IMPRESSION      1. Chest pain          DISPOSITION/PLAN   DISPOSITION  02/28/2018 09:53:31 PM      PATIENT REFERRED TO:  Stoney Bang, APRN - CNP  [address]  [place] [address]  [phone]    Schedule an appointment as soon as possible for a visit in 1 week      DISCHARGE  MEDICATIONS:  New Prescriptions    No medications on file     Burton Apley, MD  Attending Emergency Physician                   Burton Apley, MD  02/28/18 2203

## 2018-02-28 NOTE — ED Notes (Signed)
Pt presents to er with c/o chest pain. Pt states s/sx started while she was at work. Pt denies sob or diaphoresis at onset of pain. Pt denies recent fever or chills. Pt states she does have hx of anxiety. Pt a&ox3. Skin warm and dry. Respirations even and non-labored.      Charm RingsBobbie Filipe Greathouse, RN  02/28/18 2108

## 2018-03-01 LAB — BASIC METABOLIC PANEL W/ REFLEX TO MG FOR LOW K
Anion Gap: 12 mmol/L (ref 9–17)
BUN/Creatinine Ratio: 23 — ABNORMAL HIGH (ref 9–20)
BUN: 13 mg/dL (ref 6–20)
CO2: 24 mmol/L (ref 20–31)
Calcium: 9.1 mg/dL (ref 8.6–10.4)
Chloride: 96 mmol/L — ABNORMAL LOW (ref 98–107)
Creatinine: 0.57 mg/dL (ref 0.50–0.90)
GFR African American: >60 mL/min (ref >60)
GFR Non-African American: >60 mL/min (ref >60)
Glucose: 202 mg/dL — ABNORMAL HIGH (ref 70–99)
Potassium: 3.8 mmol/L (ref 3.7–5.3)
Sodium: 132 mmol/L — ABNORMAL LOW (ref 135–144)

## 2018-03-01 LAB — EKG 12-LEAD
Atrial Rate: 64 {beats}/min
P Axis: 21 degrees
P-R Interval: 160 ms
Q-T Interval: 418 ms
QRS Duration: 84 ms
QTc Calculation (Bazett): 431 ms
R Axis: 64 degrees
T Axis: 47 degrees
Ventricular Rate: 64 {beats}/min

## 2018-03-01 LAB — CBC WITH AUTO DIFFERENTIAL
Basophils %: 1 % (ref 0–2)
Basophils Absolute: 0.06 10*3/uL (ref 0.00–0.20)
Eosinophils %: 1 % (ref 1–4)
Eosinophils Absolute: 0.13 10*3/uL (ref 0.00–0.44)
Hematocrit: 37.4 % (ref 36.3–47.1)
Hemoglobin: 12.5 g/dL (ref 11.9–15.1)
Immature Granulocytes %: 1 % — ABNORMAL HIGH
Immature Granulocytes Absolute: 0.05 10*3/uL (ref 0.00–0.30)
Lymphocytes Absolute: 2.67 10*3/uL (ref 1.10–3.70)
Lymphocytes: 29 % (ref 24–43)
MCH: 30.5 pg (ref 25.2–33.5)
MCHC: 33.4 g/dL (ref 28.4–34.8)
MCV: 91.2 fL (ref 82.6–102.9)
MPV: 9.5 fL (ref 8.1–13.5)
Monocytes %: 7 % (ref 3–12)
Monocytes Absolute: 0.63 10*3/uL (ref 0.10–1.20)
NRBC Automated: 0 per 100 WBC
Neutrophils Absolute: 5.53 10*3/uL (ref 1.50–8.10)
Platelets: 355 10*3/uL (ref 138–453)
RBC: 4.1 m/uL (ref 3.95–5.11)
RDW: 11.9 % (ref 11.8–14.4)
Seg Neutrophils: 61 % (ref 36–65)
WBC: 9.1 10*3/uL (ref 3.5–11.3)

## 2018-03-01 LAB — TROPONIN
Troponin, High Sensitivity: <6 ng/L (ref 0–14)
Troponin, High Sensitivity: <6 ng/L (ref 0–14)

## 2018-03-01 MED ORDER — ASPIRIN 81 MG PO CHEW
81 MG | Freq: Once | ORAL | Status: AC
Start: 2018-03-01 — End: 2018-02-28
  Administered 2018-03-01: 01:00:00 324 mg via ORAL

## 2018-03-01 MED ORDER — NITROGLYCERIN 0.4 MG SL SUBL
0.4 MG | Freq: Once | SUBLINGUAL | Status: AC
Start: 2018-03-01 — End: 2018-02-28
  Administered 2018-03-01: 01:00:00 0.4 mg via SUBLINGUAL

## 2018-03-01 MED FILL — NITROGLYCERIN 0.4 MG SL SUBL: 0.4 mg | SUBLINGUAL | Qty: 1

## 2018-03-01 MED FILL — ASPIRIN LOW STRENGTH 81 MG PO CHEW: 81 mg | ORAL | Qty: 4

## 2018-05-01 ENCOUNTER — Inpatient Hospital Stay
Admit: 2018-05-01 | Discharge: 2018-05-01 | Disposition: A | Discharge status: Home / Self Care | Payer: PRIVATE HEALTH INSURANCE | Attending: Emergency Medicine

## 2018-05-01 DIAGNOSIS — R739 Hyperglycemia, unspecified: Secondary | ICD-10-CM

## 2018-05-01 LAB — POC PANEL (G3)-VEN
HCO3, Venous: 21.7 mmol/L — ABNORMAL LOW (ref 24–30)
Negative Base Excess, Ven: 3 — ABNORMAL HIGH (ref 0.0–2.0)
O2 Sat, Ven: 70 %
Total CO2, Venous: 23 mmol/L — ABNORMAL LOW (ref 25–31)
pCO2, Ven: 37 mm Hg — ABNORMAL LOW (ref 39–55)
pH, Ven: 7.38 (ref 7.32–7.42)
pO2, Ven: 37 mm Hg (ref 30–50)

## 2018-05-01 LAB — POCT URINALYSIS DIPSTICK
Bilirubin, UA: NEGATIVE
Blood, UA POC: NEGATIVE
Leukocytes, UA: NEGATIVE
Nitrite, UA: NEGATIVE
Protein, UA POC: NEGATIVE
Spec Grav, UA: 1.02
Urobilinogen, UA: 0.2
pH, UA: 5.5

## 2018-05-01 LAB — BASIC METABOLIC PANEL W/ REFLEX TO MG FOR LOW K
Anion Gap: 13 mmol/L (ref 9–17)
BUN/Creatinine Ratio: 16 (ref 9–20)
BUN: 14 mg/dL (ref 6–20)
CO2: 22 mmol/L (ref 20–31)
Calcium: 9.5 mg/dL (ref 8.6–10.4)
Chloride: 98 mmol/L (ref 98–107)
Creatinine: 0.89 mg/dL (ref 0.50–0.90)
GFR African American: >60 mL/min (ref >60)
GFR Non-African American: >60 mL/min (ref >60)
Glucose: 338 mg/dL — ABNORMAL HIGH (ref 70–99)
Potassium: 4.5 mmol/L (ref 3.7–5.3)
Sodium: 133 mmol/L — ABNORMAL LOW (ref 135–144)

## 2018-05-01 LAB — CBC WITH AUTO DIFFERENTIAL
Basophils %: 1 % (ref 0–2)
Basophils Absolute: 0.05 10*3/uL (ref 0.00–0.20)
Eosinophils %: 1 % (ref 1–4)
Eosinophils Absolute: 0.06 10*3/uL (ref 0.00–0.44)
Hematocrit: 39.9 % (ref 36.3–47.1)
Hemoglobin: 13.4 g/dL (ref 11.9–15.1)
Immature Granulocytes %: 0 %
Immature Granulocytes Absolute: 0.04 10*3/uL (ref 0.00–0.30)
Lymphocytes Absolute: 1.82 10*3/uL (ref 1.10–3.70)
Lymphocytes: 20 % — ABNORMAL LOW (ref 24–43)
MCH: 31.2 pg (ref 25.2–33.5)
MCHC: 33.6 g/dL (ref 28.4–34.8)
MCV: 93 fL (ref 82.6–102.9)
MPV: 9.4 fL (ref 8.1–13.5)
Monocytes %: 7 % (ref 3–12)
Monocytes Absolute: 0.65 10*3/uL (ref 0.10–1.20)
NRBC Automated: 0 per 100 WBC
Neutrophils Absolute: 6.72 10*3/uL (ref 1.50–8.10)
Platelets: 349 10*3/uL (ref 138–453)
RBC: 4.29 m/uL (ref 3.95–5.11)
RDW: 12.8 % (ref 11.8–14.4)
Seg Neutrophils: 71 % — ABNORMAL HIGH (ref 36–65)
WBC: 9.3 10*3/uL (ref 3.5–11.3)

## 2018-05-01 LAB — POC GLUCOSE FINGERSTICK
POC Glucose: 340 mg/dL — ABNORMAL HIGH (ref 65–105)
POC Glucose: 281 mg/dL — ABNORMAL HIGH (ref 65–105)

## 2018-05-01 LAB — HEPATIC FUNCTION PANEL
ALT: 24 U/L (ref 5–33)
AST: 16 U/L (ref <32)
Albumin: 4.2 g/dL (ref 3.5–5.2)
Alkaline Phosphatase: 136 U/L — ABNORMAL HIGH (ref 35–104)
Bilirubin, Direct: <0.08 mg/dL (ref <0.31)
Total Bilirubin: 0.18 mg/dL — ABNORMAL LOW (ref 0.3–1.2)
Total Protein: 7.6 g/dL (ref 6.4–8.3)

## 2018-05-01 LAB — LIPASE: Lipase: 36 U/L (ref 13–60)

## 2018-05-01 LAB — BETA-HYDROXYBUTYRATE: Beta-Hydroxybutyrate: 0.18 mmol/L (ref 0.02–0.27)

## 2018-05-01 MED ORDER — SODIUM CHLORIDE 0.9 % IV BOLUS
0.9 % | Freq: Once | INTRAVENOUS | Status: AC
Start: 2018-05-01 — End: 2018-05-01
  Administered 2018-05-01: 12:00:00 2000 mL via INTRAVENOUS

## 2018-05-01 NOTE — ED Provider Notes (Signed)
ADDENDUM:        Care of this patient was assumed from Dr. Jilda Roche    at  0700    .  The patient was seen for Hyperglycemia (x 2 days)  .  The patient's initial evaluation and plan have been discussed with the prior provider who initially evaluated the patient.  Nursing Notes, Past Medical Hx, Past Surgical Hx, Social Hx, Allergies, and Family Hx were all reviewed.      I performed a repeat evaluation of the patient and reviewed tests completed so far.    Patient was endorsed to me pending laboratory analysis, beta hydroxy  as well as VBG no evidence of DKA.  Patient blood sugar was 340 she received a 1 L fluid bolus.  Recheck of blood sugar 288. Patient states that she has been noncompliant with her diet in regards to eating sugary foods as well as drinking pop.  She does have a endocrinologist that she states she is going to call on Monday to schedule an appointment.  She states she does have a sliding scale but not when her sugars are high.  Consult  for diabetic education ordered.  Patient was otherwise hemodynamic stable in the emergency department did not require any of acute medical intervention.  Discharged home with outpatient follow-up and given parameters for return to the emergency department.      ED Course        The patient was given the following medications:  Orders Placed This Encounter   Medications   ??? 0.9 % sodium chloride bolus       RECENT VITALS:  BP: 124/82, Temp: 97.7 ??F (36.5 ??C), Pulse: 105, Resp: 16     RADIOLOGY:All plain film, CT, MRI, and formal ultrasound images (except ED bedside ultrasound) are read by the radiologist and the images and interpretations are directly viewed by the emergency physician.   No orders to display         LABS: All lab results were reviewed by myself, and all abnormals are listed below.  Labs Reviewed   CBC WITH AUTO DIFFERENTIAL - Abnormal; Notable for the following components:       Result Value    Seg Neutrophils 71 (*)     Lymphocytes 20 (*)     All  other components within normal limits   BASIC METABOLIC PANEL W/ REFLEX TO MG FOR LOW K - Abnormal; Notable for the following components:    Glucose 338 (*)     Sodium 133 (*)     All other components within normal limits   HEPATIC FUNCTION PANEL - Abnormal; Notable for the following components:    Alkaline Phosphatase 136 (*)     Total Bilirubin 0.18 (*)     All other components within normal limits   POC PANEL (G3)-VEN - Abnormal; Notable for the following components:    pCO2, Ven 37 (*)     Total CO2, Venous 23 (*)     HCO3, Venous 21.7 (*)     Negative Base Excess, Ven 3 (*)     All other components within normal limits   POC GLUCOSE FINGERSTICK - Abnormal; Notable for the following components:    POC Glucose 340 (*)     All other components within normal limits   POC GLUCOSE FINGERSTICK - Abnormal; Notable for the following components:    POC Glucose 281 (*)     All other components within normal limits   POCT URINALYSIS DIPSTICK -  Normal   LIPASE   BETA-HYDROXYBUTYRATE   BLOOD GAS, VENOUS           Disposition   DISPOSITION:    DISPOSITION Decision To Discharge 05/01/2018 07:31:19 AM      CLINICAL IMPRESSION:  1. Hyperglycemia        PATIENT REFERRED TO:  Stoney Bang, APRN - CNP  [address]  [place] [address]  [phone]            DISCHARGE MEDICATIONS:  New Prescriptions    No medications on file       Ethelene Browns, MD  Attending Emergency Physician              Ethelene Browns, MD  05/01/18 [phone]

## 2018-05-01 NOTE — Discharge Instructions (Signed)
Please limit or decrease amount of sugary beverages and foods in your diet.  Continue to monitor blood sugar.  Consults been placed for diabetic education and  they will contact you soon.  Follow-up with primary care doctor on Monday.

## 2018-05-01 NOTE — ED Notes (Signed)
Patient presents to ED with c/o elevated blood sugars since Thursday. Denies contacting her doctor about issue. Patient is on insulin at home. Blood sugars have been running in the 390s-400s. Patient also reports c/o feeling light headed and dizzy. Patient AOx3 and in no acute distress in room. Ambulated to bathroom with steady gait. Respirations even and non-labored. Lung sounds clear to auscultation. Skin warm and dry. Will continue to monitor.     Pearla Dubonnet, RN  05/01/18 [phone]

## 2018-05-01 NOTE — ED Provider Notes (Signed)
EMERGENCY DEPARTMENT ENCOUNTER    Pt Name: Carolyn Crane  MRN: [id number]  Birthdate [birth year]  Date of evaluation: 05/01/18  CHIEF COMPLAINT       Chief Complaint   Patient presents with   ??? Hyperglycemia     x 2 days     HISTORY OF PRESENT ILLNESS   HPI    43 year old female history of insulin-dependent diabetes presents to the ED for evaluation of high blood sugar that is been ongoing since since Thursday.  Patient stated her sugars been running high in the ranges of 390s to 400s.  Patient stated that she is usually in the 200s.  Patient is also noted that she has been having blurry vision lightheadedness and dizziness.  Patient is also noticing lower abdominal pain.  Patient with no dysuria hematuria flank pain.  Patient with no chest pain.  Patient with no shortness of breath URI symptoms.  Patient with no fever or chills.  He stated that she has never been in DKA nor admitted to the ICU for diabetes    REVIEW OF SYSTEMS     Review of Systems   All other systems reviewed and are negative.    PASTMEDICAL HISTORY     Past Medical History:   Diagnosis Date   ??? Bipolar 1 disorder (HCC)    ??? Depression    ??? Diabetes mellitus (HCC)    ??? Hyperlipidemia    ??? IBS (irritable bowel syndrome)    ??? PCOS (polycystic ovarian syndrome)    ??? PMR (polymyalgia rheumatica) (HCC)      SURGICAL HISTORY       Past Surgical History:   Procedure Laterality Date   ??? APPENDECTOMY     ??? CHOLECYSTECTOMY     ??? TONSILLECTOMY       CURRENT MEDICATIONS       Discharge Medication List as of 05/01/2018  7:33 AM      CONTINUE these medications which have NOT CHANGED    Details   pantoprazole (PROTONIX) 20 MG tablet Take 2 tablets by mouth daily, Disp-30 tablet, R-0Print      escitalopram (LEXAPRO) 10 MG tablet Take 15 mg by mouth dailyHistorical Med      Liraglutide (VICTOZA SC) Inject into the skinHistorical Med      atorvastatin (LIPITOR) 80 MG tablet Take 80 mg by mouth dailyHistorical Med      Lansoprazole (PREVACID PO) Take by mouthHistorical  Med      propranolol (INDERAL LA) 60 MG extended release capsule Take 60 mg by mouth dailyHistorical Med      butalbital-acetaminophen-caffeine (FIORICET, ESGIC) 50-325-40 MG per tablet Take 1 tablet by mouth every 4 hours as needed for HeadachesHistorical Med      ibuprofen (ADVIL;MOTRIN) 800 MG tablet Take 800 mg by mouthHistorical Med      valACYclovir (VALTREX) 500 MG tablet Take 500 mg by mouthHistorical Med      NONFORMULARY Take 2 tablets by mouth daily OTC supplement GABA + Theanine dissolving tabletsHistorical Med      fenofibrate 160 MG tablet Take 160 mg by mouth daily Historical Med      Insulin Degludec (TRESIBA FLEXTOUCH) 200 UNIT/ML SOPN Inject 50 Units into the skin every morning Historical Med      ARIPiprazole (ABILIFY) 15 MG tablet Take 15 mg by mouth daily Historical Med           ALLERGIES     is allergic to prochlorperazine; reglan [metoclopramide]; and zofran [ondansetron hcl].  FAMILY  HISTORY     has no family status information on file.      SOCIAL HISTORY       Social History     Tobacco Use   ??? Smoking status: Never Smoker   ??? Smokeless tobacco: Never Used   Substance Use Topics   ??? Alcohol use: Yes     Comment: socially   ??? Drug use: No     PHYSICAL EXAM     INITIAL VITALS:   Vitals:    05/01/18 0616 05/01/18 0617   BP: 124/82    Pulse: 105    Resp: 16    Temp: 97.7 ??F (36.5 ??C)    TempSrc: Oral    SpO2: 100%    Weight:  210 lb 4.8 oz (95.4 kg)   Height:  5\' 5"  (1.651 m)       Physical Exam  Constitutional:       Appearance: She is well-developed.   HENT:      Head: Normocephalic and atraumatic.   Eyes:      Conjunctiva/sclera: Conjunctivae normal.   Neck:      Musculoskeletal: Normal range of motion and neck supple.   Cardiovascular:      Rate and Rhythm: Normal rate and regular rhythm.   Pulmonary:      Effort: Pulmonary effort is normal.      Breath sounds: Normal breath sounds.   Abdominal:      Palpations: Abdomen is soft. There is no mass.      Tenderness: There is no tenderness.    Musculoskeletal: Normal range of motion.         General: No tenderness or deformity.   Skin:     General: Skin is warm and dry.   Neurological:      Mental Status: She is alert and oriented to person, place, and time.         MEDICAL DECISION MAKING:          CRITICAL CARE:       PROCEDURES:    Procedures    DIAGNOSTIC RESULTS   EKG:All EKG's are interpreted by the Emergency Department Physician who either signs or Co-signs this chart in the absence of a cardiologist.        RADIOLOGY:All plain film, CT, MRI, and formal ultrasound images (except ED bedside ultrasound) are read by the radiologist, see reports below, unless otherwisenoted in MDM or here.  No orders to display     LABS: All lab results were reviewed by myself, and all abnormals are listed below.  Labs Reviewed   CBC WITH AUTO DIFFERENTIAL - Abnormal; Notable for the following components:       Result Value    Seg Neutrophils 71 (*)     Lymphocytes 20 (*)     All other components within normal limits   BASIC METABOLIC PANEL W/ REFLEX TO MG FOR LOW K - Abnormal; Notable for the following components:    Glucose 338 (*)     Sodium 133 (*)     All other components within normal limits   HEPATIC FUNCTION PANEL - Abnormal; Notable for the following components:    Alkaline Phosphatase 136 (*)     Total Bilirubin 0.18 (*)     All other components within normal limits   POC PANEL (G3)-VEN - Abnormal; Notable for the following components:    pCO2, Ven 37 (*)     Total CO2, Venous 23 (*)     HCO3,  Venous 21.7 (*)     Negative Base Excess, Ven 3 (*)     All other components within normal limits   POC GLUCOSE FINGERSTICK - Abnormal; Notable for the following components:    POC Glucose 340 (*)     All other components within normal limits   POC GLUCOSE FINGERSTICK - Abnormal; Notable for the following components:    POC Glucose 281 (*)     All other components within normal limits   POCT URINALYSIS DIPSTICK - Normal   LIPASE   BETA-HYDROXYBUTYRATE       EMERGENCY  DEPARTMENTCOURSE:         Vitals:    Vitals:    05/01/18 0616 05/01/18 0617   BP: 124/82    Pulse: 105    Resp: 16    Temp: 97.7 ??F (36.5 ??C)    TempSrc: Oral    SpO2: 100%    Weight:  210 lb 4.8 oz (95.4 kg)   Height:  5\' 5"  (1.651 m)       The patient was given the following medications while in the emergency department:  Orders Placed This Encounter   Medications   ??? 0.9 % sodium chloride bolus     CONSULTS:  IP CONSULT TO DIABETES EDUCATOR    FINAL IMPRESSION      1. Hyperglycemia          DISPOSITION/PLAN   DISPOSITION      Patient signed out to oncoming physician Dr. Mitzi DavenportShelby    PATIENT REFERRED TO:  Stoney BangJoanna Shaw, APRN - CNP  [address]  [place] [id number]  [phone]          DISCHARGE MEDICATIONS:  Discharge Medication List as of 05/01/2018  7:33 AM        Burton ApleyJEAN-PAUL Kashena Novitski, MD  Attending Emergency Physician                   Burton ApleyJean-Paul Braelynne Garinger, MD  05/03/18 2101

## 2018-05-25 NOTE — Telephone Encounter (Signed)
Received new referral for Diabetes Medication Management from Dr. Mitzi Davenport.  Called patient and she declined our services stating she already has an endocrinologist.  I asked if she had followed up since recent ED visit and she states that she had.  Advised patient if she is interested in our services in the future to call and schedule.

## 2018-06-27 DIAGNOSIS — L02213 Cutaneous abscess of chest wall: Secondary | ICD-10-CM

## 2018-06-27 NOTE — ED Notes (Signed)
Pt arrived to ED with c/o follow up assessment of Abscess to right upper breast that started two days ago. Pt states she was seen at [facility] yesterday, they opened the abscess and placed pt on Bactrim. No active drainage noted. Redness noted around abscess. Pt concerned about possible infection. Respirations non labored. Skin warm and dry. Pt A&Ox4.      Elaina Pattee, RN  06/27/18 [phone]

## 2018-06-27 NOTE — ED Triage Notes (Signed)
C/o abscess on right anterior chest wall x 2 days. Was seen at [facility] yesterday for same. Was lanced with no drainage, but told to follow up d/t dm.

## 2018-06-27 NOTE — ED Notes (Signed)
Pt requesting diet pop and lunch box.  Dr Joycelyn Das informed, okayed for pop and lunch box.  Both given to patient.  Will continue to monitor.      Benjamine Sprague, RN  06/27/18 [phone]

## 2018-06-27 NOTE — ED Provider Notes (Signed)
[facility] ED  EMERGENCY DEPARTMENT ENCOUNTER      Pt Name: Carolyn Crane  MRN: [id number]  Birthdate 1975-05-17  Date of evaluation: 06/27/2018  Provider: Cherie Ouch, MD    CHIEF COMPLAINT       Chief Complaint   Patient presents with   ??? Abscess         HISTORY OF PRESENT ILLNESS  (Location/Symptom, Timing/Onset, Context/Setting, Quality, Duration, Modifying Factors, Severity.)   Carolyn Crane is a 43 y.o. female who presents to the emergency department for a painful red area on her right upper chest.  She has had it for the past 2 days.  Yesterday she was seen at another hospital and she states they lanced it and did not get anything out of it.  She was prescribed Bactrim but she feels like it is not getting better and is probably worse.  She states she had a low-grade fever of 99 degrees at home.  No vomiting diarrhea or generalized rash.      Nursing Notes were reviewed.    ALLERGIES     Prochlorperazine; Reglan [metoclopramide]; and Zofran [ondansetron hcl]    CURRENT MEDICATIONS       Previous Medications    ARIPIPRAZOLE (ABILIFY) 15 MG TABLET    Take 30 mg by mouth daily     ATORVASTATIN (LIPITOR) 80 MG TABLET    Take 80 mg by mouth daily    ESCITALOPRAM (LEXAPRO) 10 MG TABLET    Take 15 mg by mouth daily    FENOFIBRATE 160 MG TABLET    Take 160 mg by mouth daily     IBUPROFEN (ADVIL;MOTRIN) 800 MG TABLET    Take 800 mg by mouth    INSULIN DEGLUDEC (TRESIBA FLEXTOUCH) 200 UNIT/ML SOPN    Inject 100 Units into the skin every morning     LANSOPRAZOLE (PREVACID PO)    Take by mouth    LIRAGLUTIDE (VICTOZA SC)    Inject 1.8 Units into the skin daily     PANTOPRAZOLE (PROTONIX) 20 MG TABLET    Take 2 tablets by mouth daily    VALACYCLOVIR (VALTREX) 500 MG TABLET    Take 500 mg by mouth       PAST MEDICAL HISTORY         Diagnosis Date   ??? Bipolar 1 disorder (HCC)    ??? Depression    ??? Diabetes mellitus (HCC)    ??? Hyperlipidemia    ??? IBS (irritable bowel syndrome)    ??? PCOS (polycystic ovarian syndrome)     ??? PMR (polymyalgia rheumatica) (HCC)        SURGICAL HISTORY           Procedure Laterality Date   ??? APPENDECTOMY     ??? CHOLECYSTECTOMY     ??? FOOT SURGERY Left     plantar fascitiis release   ??? TONSILLECTOMY           FAMILY HISTORY     History reviewed. No pertinent family history.  No family status information on file.        SOCIAL HISTORY      reports that she has never smoked. She has never used smokeless tobacco. She reports current alcohol use. She reports that she does not use drugs.    REVIEW OF SYSTEMS    (2-9 systems for level 4, 10 or more for level 5)     Review of Systems   Constitutional: Negative for chills and  fatigue.   HENT: Negative for congestion, ear discharge and facial swelling.    Eyes: Negative for discharge and redness.   Respiratory: Negative for cough and shortness of breath.    Cardiovascular: Negative for chest pain.   Gastrointestinal: Negative for abdominal pain, constipation, diarrhea and vomiting.   Genitourinary: Negative for dysuria and hematuria.   Musculoskeletal: Negative for arthralgias.   Skin: Negative for color change and rash.   Neurological: Negative for syncope, numbness and headaches.   Hematological: Negative for adenopathy.   Psychiatric/Behavioral: Negative for confusion. The patient is not nervous/anxious.         Except as noted above the remainder of the review of systems was reviewed and negative.     PHYSICAL EXAM    (up to 7 for level 4, 8 or more for level 5)     Vitals:    06/27/18 2029   BP: 138/78   Pulse: 94   Resp: 20   Temp: 98.6 ??F (37 ??C)   TempSrc: Oral   SpO2: 96%   Weight: 205 lb (93 kg)   Height: 5\' 5"  (1.651 m)       Physical Exam  Constitutional:       General: She is not in acute distress.     Appearance: She is well-developed. She is not diaphoretic.   HENT:      Head: Normocephalic and atraumatic.   Eyes:      General: No scleral icterus.        Right eye: No discharge.         Left eye: No discharge.   Neck:      Musculoskeletal: Neck  supple.   Cardiovascular:      Rate and Rhythm: Normal rate and regular rhythm.   Pulmonary:      Effort: Pulmonary effort is normal. No respiratory distress.      Breath sounds: Normal breath sounds. No stridor. No wheezing or rales.   Abdominal:      General: There is no distension.      Palpations: Abdomen is soft.      Tenderness: There is no abdominal tenderness.   Musculoskeletal: Normal range of motion.   Lymphadenopathy:      Cervical: No cervical adenopathy.   Skin:     General: Skin is warm and dry.      Findings: No erythema or rash.      Comments: Right upper chest shows an erythematous area with a firm nonfluctuant area at the center which is approximately 1-1/2 cm in diameter.  There is no active drainage or open area currently.   Neurological:      Mental Status: She is alert and oriented to person, place, and time.   Psychiatric:         Behavior: Behavior normal.             DIAGNOSTIC RESULTS     EKG: All EKG's are interpreted by the Emergency Department Physician who either signs or Co-signs this chart in the absence of a cardiologist.    Not indicated    RADIOLOGY:   Non-plain film images such as CT, Ultrasound and MRI are read by the radiologist. Plain radiographic images are visualized and preliminarily interpreted by the emergency physician with the below findings:    Not indicated    Interpretation per the Radiologist below, if available at the time of this note:        LABS:  Labs Reviewed   CBC  WITH AUTO DIFFERENTIAL - Abnormal; Notable for the following components:       Result Value    Seg Neutrophils 68 (*)     All other components within normal limits   BASIC METABOLIC PANEL - Abnormal; Notable for the following components:    Glucose 280 (*)     CREATININE 0.93 (*)     Chloride 97 (*)     All other components within normal limits       All other labs were within normal range or not returned as of this dictation.    EMERGENCY DEPARTMENT COURSE and DIFFERENTIAL DIAGNOSIS/MDM:   Vitals:     Vitals:    06/27/18 2029   BP: 138/78   Pulse: 94   Resp: 20   Temp: 98.6 ??F (37 ??C)   TempSrc: Oral   SpO2: 96%   Weight: 205 lb (93 kg)   Height: 5\' 5"  (1.651 m)       Orders Placed This Encounter   Medications   ??? vancomycin 1000 mg IVPB in 250 mL D5W addavial   ??? cephALEXin (KEFLEX) 500 MG capsule     Sig: Take 1 capsule by mouth 4 times daily for 10 days     Dispense:  40 capsule     Refill:  0       Medical Decision Making: White count is normal.  She was given IV vancomycin here and is being prescribed Keflex.  Another incision and drainage is not indicated.  She just had I&D yesterday without result.  She is referred to general surgery in the event that it does not improve.  Treatment diagnosis and follow-up were discussed with the patient.      CONSULTS:  None    PROCEDURES:  None    FINAL IMPRESSION      1. Abscess          DISPOSITION/PLAN   DISPOSITION Discharge - Pending Orders Complete 06/27/2018 10:23:28 PM      PATIENT REFERRED TO:   Stoney Bang, APRN - CNP  [address], [address]  [place] [address]  [phone]      As needed    [facility] ED  [address] [place] [address]  [phone]    If symptoms worsen    Lina Sayre, DO  [address]  [place] [address]  [phone]            DISCHARGE MEDICATIONS:     New Prescriptions    CEPHALEXIN (KEFLEX) 500 MG CAPSULE    Take 1 capsule by mouth 4 times daily for 10 days         (Please note that portions of this note were completed with a voice recognition program.  Efforts were made to edit the dictations but occasionally words are mis-transcribed.)    Cherie Ouch, MD  Attending Emergency Physician           Cherie Ouch, MD  06/27/18 2224

## 2018-06-28 ENCOUNTER — Inpatient Hospital Stay
Admit: 2018-06-28 | Discharge: 2018-06-28 | Disposition: A | Discharge status: Home / Self Care | Payer: PRIVATE HEALTH INSURANCE | Attending: Emergency Medicine

## 2018-06-28 LAB — BASIC METABOLIC PANEL
Anion Gap: 13 mmol/L (ref 9–17)
BUN/Creatinine Ratio: 19 (ref 9–20)
BUN: 18 mg/dL (ref 6–20)
CO2: 25 mmol/L (ref 20–31)
Calcium: 9.6 mg/dL (ref 8.6–10.4)
Chloride: 97 mmol/L — ABNORMAL LOW (ref 98–107)
Creatinine: 0.93 mg/dL — ABNORMAL HIGH (ref 0.50–0.90)
GFR African American: >60 mL/min (ref >60)
GFR Non-African American: >60 mL/min (ref >60)
Glucose: 280 mg/dL — ABNORMAL HIGH (ref 70–99)
Potassium: 4 mmol/L (ref 3.7–5.3)
Sodium: 135 mmol/L (ref 135–144)

## 2018-06-28 LAB — CBC WITH AUTO DIFFERENTIAL
Basophils %: 1 % (ref 0–2)
Basophils Absolute: 0.05 10*3/uL (ref 0.00–0.20)
Eosinophils %: 1 % (ref 1–4)
Eosinophils Absolute: 0.07 10*3/uL (ref 0.00–0.44)
Hematocrit: 39.1 % (ref 36.3–47.1)
Hemoglobin: 13.1 g/dL (ref 11.9–15.1)
Immature Granulocytes %: 0 %
Immature Granulocytes Absolute: 0.03 10*3/uL (ref 0.00–0.30)
Lymphocytes Absolute: 2.16 10*3/uL (ref 1.10–3.70)
Lymphocytes: 24 % (ref 24–43)
MCH: 31.1 pg (ref 25.2–33.5)
MCHC: 33.5 g/dL (ref 28.4–34.8)
MCV: 92.9 fL (ref 82.6–102.9)
MPV: 9.4 fL (ref 8.1–13.5)
Monocytes %: 6 % (ref 3–12)
Monocytes Absolute: 0.53 10*3/uL (ref 0.10–1.20)
NRBC Automated: 0 per 100 WBC
Neutrophils Absolute: 6.07 10*3/uL (ref 1.50–8.10)
Platelets: 317 10*3/uL (ref 138–453)
RBC: 4.21 m/uL (ref 3.95–5.11)
RDW: 12.2 % (ref 11.8–14.4)
Seg Neutrophils: 68 % — ABNORMAL HIGH (ref 36–65)
WBC: 8.9 10*3/uL (ref 3.5–11.3)

## 2018-06-28 MED ORDER — CEPHALEXIN 500 MG PO CAPS
500 MG | ORAL_CAPSULE | Freq: Four times a day (QID) | ORAL | 0 refills | Status: AC
Start: 2018-06-28 — End: 2018-07-07

## 2018-06-28 MED ORDER — DEXTROSE 5 % IV SOLN ADMIXTURE
5 % | Freq: Once | INTRAVENOUS | Status: AC
Start: 2018-06-28 — End: 2018-06-27
  Administered 2018-06-28: 03:00:00 1000 mg via INTRAVENOUS

## 2018-06-28 MED FILL — VANCOMYCIN HCL 1 G IV SOLR: 1 g | INTRAVENOUS | Qty: 1000

## 2018-09-08 ENCOUNTER — Inpatient Hospital Stay
Admit: 2018-09-08 | Discharge: 2018-09-09 | Disposition: A | Discharge status: Home / Self Care | Payer: PRIVATE HEALTH INSURANCE | Attending: Emergency Medicine

## 2018-09-08 ENCOUNTER — Emergency Department: Admit: 2018-09-09 | Payer: PRIVATE HEALTH INSURANCE | Primary: Nurse Practitioner

## 2018-09-08 DIAGNOSIS — R600 Localized edema: Secondary | ICD-10-CM

## 2018-09-08 NOTE — ED Provider Notes (Signed)
[facility] ED  EMERGENCY DEPARTMENT ENCOUNTER      Pt Name: Carolyn Crane  MRN: [id number]  Birthdate 03-29-1976  Date of evaluation: 09/08/2018  Provider: Eudelia Bunch, APRN - CNP    CHIEF COMPLAINT       Chief Complaint   Patient presents with   ??? Shortness of Breath         HISTORY OFPRESENT ILLNESS  (Location/Symptom, Timing/Onset, Context/Setting, Quality, Duration, Modifying Factors, Severity.)   Carolyn Crane is a 43 y.o. female who presents to the emergency department for evaluation of swelling in her legs and shortness of breath for the past 4 days.  Patient states she has gained 3 pounds in the past 2 days.  No chest pain.  She seen by her primary care doctor earlier today but was not placed on any medications for her swelling.  She has history of diabetes, bipolar disorder, hyperlipidemia.      Nursing Notes were reviewed.    PASTMEDICAL HISTORY     Past Medical History:   Diagnosis Date   ??? Bipolar 1 disorder (HCC)    ??? Depression    ??? Diabetes mellitus (HCC)    ??? Hyperlipidemia    ??? IBS (irritable bowel syndrome)    ??? PCOS (polycystic ovarian syndrome)    ??? PMR (polymyalgia rheumatica) (HCC)          SURGICAL HISTORY       Past Surgical History:   Procedure Laterality Date   ??? APPENDECTOMY     ??? CHOLECYSTECTOMY     ??? FOOT SURGERY Left     plantar fascitiis release   ??? TONSILLECTOMY           CURRENT MEDICATIONS     Discharge Medication List as of 09/08/2018  9:55 PM      CONTINUE these medications which have NOT CHANGED    Details   escitalopram (LEXAPRO) 10 MG tablet Take 10 mg by mouth daily Historical Med      Liraglutide (VICTOZA SC) Inject 1.8 Units into the skin daily Historical Med      atorvastatin (LIPITOR) 80 MG tablet Take 80 mg by mouth dailyHistorical Med      ibuprofen (ADVIL;MOTRIN) 800 MG tablet Take 800 mg by mouthHistorical Med      valACYclovir (VALTREX) 500 MG tablet Take 1,000 mg by mouth 2 times daily as needed Historical Med      fenofibrate 160 MG tablet Take 160 mg by mouth  daily Historical Med      Insulin Degludec (TRESIBA FLEXTOUCH) 200 UNIT/ML SOPN Inject 100 Units into the skin every morning Historical Med      ARIPiprazole (ABILIFY) 15 MG tablet Take 30 mg by mouth daily Historical Med             ALLERGIES     Prochlorperazine; Reglan [metoclopramide]; and Zofran [ondansetron hcl]    FAMILY HISTORY     History reviewed. No pertinent family history.       SOCIAL HISTORY       Social History     Socioeconomic History   ??? Marital status: Divorced     Spouse name: None   ??? Number of children: None   ??? Years of education: None   ??? Highest education level: None   Occupational History   ??? None   Social Needs   ??? Financial resource strain: None   ??? Food insecurity     Worry: None  Inability: None   ??? Transportation needs     Medical: None     Non-medical: None   Tobacco Use   ??? Smoking status: Never Smoker   ??? Smokeless tobacco: Never Used   Substance and Sexual Activity   ??? Alcohol use: Yes     Comment: socially   ??? Drug use: No   ??? Sexual activity: None   Lifestyle   ??? Physical activity     Days per week: None     Minutes per session: None   ??? Stress: None   Relationships   ??? Social Wellsite geologist on phone: None     Gets together: None     Attends religious service: None     Active member of club or organization: None     Attends meetings of clubs or organizations: None     Relationship status: None   ??? Intimate partner violence     Fear of current or ex partner: None     Emotionally abused: None     Physically abused: None     Forced sexual activity: None   Other Topics Concern   ??? None   Social History Narrative   ??? None         REVIEW OF SYSTEMS    (2-9 systems for level 4, 10 or more for level 5)     Review of Systems   Constitutional: Positive for activity change and unexpected weight change.   Respiratory: Positive for cough and shortness of breath. Negative for wheezing.    Cardiovascular: Positive for leg swelling. Negative for chest pain.   Gastrointestinal:  Negative for abdominal pain, nausea and vomiting.   All other systems reviewed and are negative.    Except as noted above the remainder of the review of systems was reviewed and negative.     PHYSICAL EXAM    (up to 7 for level 4, 8 or more for level 5)     ED Triage Vitals [09/08/18 1929]   BP Temp Temp src Pulse Resp SpO2 Height Weight   137/74 98.3 ??F (36.8 ??C) -- 79 20 98 % 5\' 5"  (1.651 m) 222 lb 5 oz (100.8 kg)       Physical Exam  Constitutional:       Appearance: Normal appearance. She is well-developed, well-groomed and overweight.   HENT:      Head: Normocephalic.      Right Ear: External ear normal.      Left Ear: External ear normal.      Nose: Nose normal.      Mouth/Throat:      Mouth: Mucous membranes are moist.   Eyes:      Extraocular Movements: Extraocular movements intact.      Conjunctiva/sclera: Conjunctivae normal.      Pupils: Pupils are equal, round, and reactive to light.   Neck:      Musculoskeletal: Normal range of motion and neck supple.   Cardiovascular:      Rate and Rhythm: Normal rate and regular rhythm.      Pulses: Normal pulses.      Heart sounds: Normal heart sounds, S1 normal and S2 normal.   Pulmonary:      Effort: Tachypnea present.      Breath sounds: Normal breath sounds.   Musculoskeletal: Normal range of motion.      Right lower leg: 1+ Pitting Edema present.      Left lower leg: 1+  Pitting Edema present.      Comments: 1+ edema noted to lower extremities.  No asymmetry.  No calf tenderness.  No erythema sitting.   Skin:     General: Skin is dry.      Capillary Refill: Capillary refill takes 2 to 3 seconds.   Neurological:      Mental Status: She is alert and oriented to person, place, and time.      GCS: GCS eye subscore is 4. GCS verbal subscore is 5. GCS motor subscore is 6.      Cranial Nerves: Cranial nerves are intact.      Sensory: Sensation is intact.      Motor: Motor function is intact.      Coordination: Coordination is intact.      Gait: Gait is intact.    Psychiatric:         Mood and Affect: Mood normal.         Behavior: Behavior normal.         Thought Content: Thought content normal.         Judgment: Judgment normal.           DIAGNOSTIC RESULTS     EKG:All EKG's are interpreted by the Emergency Department Physician who either signs or Co-signs this chart in the absence of a cardiologist.    EKG interpreted by attending physician    RADIOLOGY:   Non-plain film images such as CT, Ultrasound and MRI are read by theradiologist. Plain radiographic images are visualized and preliminarily interpreted by the emergency physician with the below findings:    Xr Chest Portable    Result Date: 09/08/2018  EXAMINATION: ONE XRAY VIEW OF THE CHEST 09/08/2018 8:15 pm COMPARISON: December 01, 2017 HISTORY: ORDERING SYSTEM PROVIDED HISTORY: SOB TECHNOLOGIST PROVIDED HISTORY: SOB Reason for Exam: Pt states sob today Acuity: Acute Type of Exam: Initial FINDINGS: The lungs are without acute focal process.  There is no effusion or pneumothorax. The cardiomediastinal silhouette is without acute process. The osseous structures are without acute process.     No acute process.   Interpretation per the Radiologist below, if available at the time of this note:    XR CHEST PORTABLE   Final Result   No acute process.               EDBEDSIDE ULTRASOUND:   Performed by EDPhysician - none    LABS:  Labs Reviewed   CBC WITH AUTO DIFFERENTIAL - Abnormal; Notable for the following components:       Result Value    RBC 3.92 (*)     All other components within normal limits   BASIC METABOLIC PANEL - Abnormal; Notable for the following components:    Glucose 257 (*)     BUN 24 (*)     CREATININE 1.02 (*)     Bun/Cre Ratio 24 (*)     Sodium 132 (*)     GFR Non-African American 59 (*)     All other components within normal limits   TROP/MYOGLOBIN - Abnormal; Notable for the following components:    Myoglobin <21 (*)     All other components within normal limits   HEPATIC FUNCTION PANEL - Abnormal; Notable  for the following components:    Total Bilirubin 0.18 (*)     All other components within normal limits   URINALYSIS - Abnormal; Notable for the following components:    Glucose, Ur 3+ (*)     All  other components within normal limits   BRAIN NATRIURETIC PEPTIDE       All other labs were within normal range or not returned as of this dictation.    EMERGENCY DEPARTMENT COURSE andDIFFERENTIAL DIAGNOSIS/MDM:   The patient was evaluated in conjunction attending physician.  Labs reviewed.  Troponin negative.  Chest x-ray per radiologist shows no acute process.  Patient was given a dose of oral Lasix in emergency department and was discharged home with a prescription for Lasix for 3 more days.  Instructed to follow-up with her doctor. Return to emergency department symptoms worsen.       Vitals:    Vitals:    09/08/18 1929   BP: 137/74   Pulse: 79   Resp: 20   Temp: 98.3 ??F (36.8 ??C)   SpO2: 98%   Weight: 222 lb 5 oz (100.8 kg)   Height:  (1.651 m)         CONSULTS:  None    RES:  Procedures    FINAL IMPRESSION      1. Peripheral edema          DISPOSITION/PLAN   DISPOSITION Decision To Discharge 09/08/2018 09:54:19 PM      PATIENT REFERRED TO:   Stoney Bang, APRN - CNP  [address]  [place] [address]  [phone]    In 1 week      [facility] ED  [address] [place] [address]  [phone]    If symptoms worsen      DISCHARGE MEDICATIONS:     Discharge Medication List as of 09/08/2018  9:55 PM      START taking these medications    Details   furosemide (LASIX) 20 MG tablet Take 1 tablet by mouth daily for 3 days, Disp-3 tablet, R-0Print           Electronically signed by Eudelia Bunch, APRN - CNPon 09/09/2018 at 12:20 AM            Eudelia Bunch, APRN - CNP  09/09/18 0022

## 2018-09-08 NOTE — ED Provider Notes (Signed)
eMERGENCY dEPARTMENT eNCOUnter   Independent Attestation     Pt Name: Carolyn Crane  MRN: [id number]  Birthdate Mar 01, 1976  Date of evaluation: 09/11/18     Carolyn Crane is a 43 y.o. female with CC: Shortness of Breath      Based on the medical record the care appears appropriate.  I was personally available for consultation in the Emergency Department.    Paulina Fusi, MD  Attending Emergency Physician                    Paulina Fusi, MD  09/11/18 [phone]

## 2018-09-08 NOTE — ED Notes (Signed)
Pt presents with c/o SOB and leg swelling. States her symptoms started a couple days ago. States she has noticed that she has been gaining a lot of weight. Pt states she talked to her PCP and was told to drink a lot of water. Denies any history. Denies pain.      Ronney Asters, RN  09/08/18 2017

## 2018-09-08 NOTE — Discharge Instructions (Signed)
Take medications as prescribed.  Follow-up with your doctor.  Return to the emergency department  if symptoms worsen.

## 2018-09-09 LAB — CBC WITH AUTO DIFFERENTIAL
Basophils %: 1 % (ref 0–2)
Basophils Absolute: 0.05 10*3/uL (ref 0.00–0.20)
Eosinophils %: 1 % (ref 1–4)
Eosinophils Absolute: 0.11 10*3/uL (ref 0.00–0.44)
Hematocrit: 36.9 % (ref 36.3–47.1)
Hemoglobin: 12.3 g/dL (ref 11.9–15.1)
Immature Granulocytes %: 0 %
Immature Granulocytes Absolute: 0.03 10*3/uL (ref 0.00–0.30)
Lymphocytes Absolute: 2.28 10*3/uL (ref 1.10–3.70)
Lymphocytes: 26 % (ref 24–43)
MCH: 31.4 pg (ref 25.2–33.5)
MCHC: 33.3 g/dL (ref 28.4–34.8)
MCV: 94.1 fL (ref 82.6–102.9)
MPV: 9.7 fL (ref 8.1–13.5)
Monocytes %: 8 % (ref 3–12)
Monocytes Absolute: 0.69 10*3/uL (ref 0.10–1.20)
NRBC Automated: 0 per 100 WBC
Neutrophils Absolute: 5.47 10*3/uL (ref 1.50–8.10)
Platelets: 342 10*3/uL (ref 138–453)
RBC: 3.92 m/uL — ABNORMAL LOW (ref 3.95–5.11)
RDW: 12.7 % (ref 11.8–14.4)
Seg Neutrophils: 64 % (ref 36–65)
WBC: 8.6 10*3/uL (ref 3.5–11.3)

## 2018-09-09 LAB — HEPATIC FUNCTION PANEL
ALT: 23 U/L (ref 5–33)
AST: 19 U/L (ref <32)
Albumin: 3.8 g/dL (ref 3.5–5.2)
Alkaline Phosphatase: 88 U/L (ref 35–104)
Bilirubin, Direct: <0.08 mg/dL (ref <0.31)
Total Bilirubin: 0.18 mg/dL — ABNORMAL LOW (ref 0.3–1.2)
Total Protein: 6.7 g/dL (ref 6.4–8.3)

## 2018-09-09 LAB — EKG 12-LEAD
Atrial Rate: 78 {beats}/min
P Axis: 56 degrees
P-R Interval: 138 ms
Q-T Interval: 388 ms
QRS Duration: 76 ms
QTc Calculation (Bazett): 442 ms
R Axis: 69 degrees
T Axis: 50 degrees
Ventricular Rate: 78 {beats}/min

## 2018-09-09 LAB — URINALYSIS
Bilirubin Urine: NEGATIVE
Ketones, Urine: NEGATIVE
Leukocyte Esterase, Urine: NEGATIVE
Nitrite, Urine: NEGATIVE
Protein, UA: NEGATIVE
Specific Gravity, UA: 1.02 (ref 1.005–1.030)
Urine Hgb: NEGATIVE
Urobilinogen, Urine: NORMAL
pH, UA: 6.5 (ref 5.0–8.0)

## 2018-09-09 LAB — BASIC METABOLIC PANEL
Anion Gap: 11 mmol/L (ref 9–17)
BUN/Creatinine Ratio: 24 — ABNORMAL HIGH (ref 9–20)
BUN: 24 mg/dL — ABNORMAL HIGH (ref 6–20)
CO2: 23 mmol/L (ref 20–31)
Calcium: 9 mg/dL (ref 8.6–10.4)
Chloride: 98 mmol/L (ref 98–107)
Creatinine: 1.02 mg/dL — ABNORMAL HIGH (ref 0.50–0.90)
GFR African American: >60 mL/min (ref >60)
GFR Non-African American: 59 mL/min — ABNORMAL LOW (ref >60)
Glucose: 257 mg/dL — ABNORMAL HIGH (ref 70–99)
Potassium: 4.3 mmol/L (ref 3.7–5.3)
Sodium: 132 mmol/L — ABNORMAL LOW (ref 135–144)

## 2018-09-09 LAB — TROP/MYOGLOBIN
Myoglobin: <21 ng/mL — ABNORMAL LOW (ref 25–58)
Troponin, High Sensitivity: <6 ng/L (ref 0–14)

## 2018-09-09 LAB — BRAIN NATRIURETIC PEPTIDE: Pro-BNP: 58 pg/mL (ref <300)

## 2018-09-09 MED ORDER — FUROSEMIDE 20 MG PO TABS
20 MG | ORAL_TABLET | Freq: Every day | ORAL | 0 refills | Status: DC
Start: 2018-09-09 — End: 2018-11-16

## 2018-09-09 MED ORDER — FUROSEMIDE 20 MG PO TABS
20 MG | Freq: Once | ORAL | Status: AC
Start: 2018-09-09 — End: 2018-09-08
  Administered 2018-09-09: 02:00:00 20 mg via ORAL

## 2018-09-09 MED FILL — FUROSEMIDE 20 MG PO TABS: 20 MG | ORAL | Qty: 1

## 2018-09-09 NOTE — Care Coordination-Inpatient (Signed)
Spoke with [place] today following ED visit. Report that swelling has greatly improved. Was able to get prescription for Lasix and is taking as prescribed. Instructed Randie that she needs to continue drinking fluids to prevent dehydration, keep legs elevated throughout the day and monitor sodium intake. Educated on importance of daily weights, needs to notify her PCP if she gains 3 pounds overnight or 5 pounds in 1 week or experiences any increased shortness of breath or worsening swelling. Laroya voices understanding.       Patient contacted regarding recent discharge and COVID-19 risk   Care Transition Nurse/ Ambulatory Care Manager contacted the patient by telephone to perform post discharge assessment. Verified name and DOB with patient as identifiers.     Patient has following risk factors of: diabetes. CTN/ACM reviewed discharge instructions, medical action plan and red flags related to discharge diagnosis. Reviewed and educated them on any new and changed medications related to discharge diagnosis.  Advised obtaining a 90-day supply of all daily and as-needed medications.     Education provided regarding infection prevention, and signs and symptoms of COVID-19 and when to seek medical attention with patient who verbalized understanding. Discussed exposure protocols and quarantine from [facility] Guidelines ???Are you at higher risk for severe illness 2019??? and given an opportunity for questions and concerns. The patient agrees to contact the COVID-19 hotline [phone] or PCP office for questions related to their healthcare. CTN/ACM provided contact information for future reference.    From CDC: Are you at higher risk for severe illness?    ??? Wash your hands often.  ??? Avoid close contact (6 feet, which is about two arm lengths) with people who are sick.  ??? Put distance between yourself and other people if COVID-19 is spreading in your community.  ??? Clean and disinfect frequently touched surfaces.  ??? Avoid all cruise  travel and non-essential air travel.  ??? Call your healthcare professional if you have concerns about COVID-19 and your underlying condition or if you are sick.    For more information on steps you can take to protect yourself, see CDC's How to Protect Yourself    Plan for follow-up call in 7-14 days based on severity of symptoms and risk factors.

## 2018-09-23 NOTE — Care Coordination-Inpatient (Signed)
Your Patient resolved from the Care Transitions episode on 09/23/18    Patient/family has been provided the following resources and education related to COVID-19:                         Signs, symptoms and red flags related to COVID-19            CDC exposure and quarantine guidelines            [facility]it exposure contact - [phone]            Contact for their local Department of Health                 Patient currently reports that the following symptoms have improved:  no new/worsening symptoms     No further outreach scheduled with this CTN/ACM.  Episode of Care resolved.

## 2018-10-16 ENCOUNTER — Inpatient Hospital Stay
Admit: 2018-10-16 | Discharge: 2018-10-16 | Disposition: A | Discharge status: Home / Self Care | Payer: PRIVATE HEALTH INSURANCE | Attending: Emergency Medicine

## 2018-10-16 ENCOUNTER — Emergency Department: Admit: 2018-10-16 | Payer: PRIVATE HEALTH INSURANCE | Primary: Nurse Practitioner

## 2018-10-16 DIAGNOSIS — R1013 Epigastric pain: Secondary | ICD-10-CM

## 2018-10-16 LAB — CBC WITH AUTO DIFFERENTIAL
Absolute Eos #: 0.08 10*3/uL (ref 0.00–0.44)
Absolute Immature Granulocyte: 0.03 10*3/uL (ref 0.00–0.30)
Absolute Lymph #: 2 10*3/uL (ref 1.10–3.70)
Absolute Mono #: 0.43 10*3/uL (ref 0.10–1.20)
Basophils Absolute: 0.05 10*3/uL (ref 0.00–0.20)
Basophils: 1 % (ref 0–2)
Eosinophils %: 1 % (ref 1–4)
Hematocrit: 38 % (ref 36.3–47.1)
Hemoglobin: 12.7 g/dL (ref 11.9–15.1)
Immature Granulocytes: 0 %
Lymphocytes: 25 % (ref 24–43)
MCH: 30.9 pg (ref 25.2–33.5)
MCHC: 33.4 g/dL (ref 28.4–34.8)
MCV: 92.5 fL (ref 82.6–102.9)
MPV: 9.2 fL (ref 8.1–13.5)
Monocytes: 5 % (ref 3–12)
NRBC Automated: 0 per 100 WBC
Platelets: 342 10*3/uL (ref 138–453)
RBC: 4.11 m/uL (ref 3.95–5.11)
RDW: 12.6 % (ref 11.8–14.4)
Seg Neutrophils: 68 % — ABNORMAL HIGH (ref 36–65)
Segs Absolute: 5.53 10*3/uL (ref 1.50–8.10)
WBC: 8.1 10*3/uL (ref 3.5–11.3)

## 2018-10-16 LAB — HCG, SERUM, QUALITATIVE: hCG Qual: NEGATIVE

## 2018-10-16 LAB — BASIC METABOLIC PANEL
Anion Gap: 15 mmol/L (ref 9–17)
BUN: 13 mg/dL (ref 6–20)
Bun/Cre Ratio: 19 (ref 9–20)
CO2: 23 mmol/L (ref 20–31)
Calcium: 8.9 mg/dL (ref 8.6–10.4)
Chloride: 99 mmol/L (ref 98–107)
Creatinine: 0.7 mg/dL (ref 0.50–0.90)
GFR African American: >60 mL/min (ref >60)
GFR Non-African American: >60 mL/min (ref >60)
Glucose: 189 mg/dL — ABNORMAL HIGH (ref 70–99)
Potassium: 3.8 mmol/L (ref 3.7–5.3)
Sodium: 137 mmol/L (ref 135–144)

## 2018-10-16 LAB — HEPATIC FUNCTION PANEL
ALT: 41 U/L — ABNORMAL HIGH (ref 5–33)
AST: 26 U/L (ref <32)
Albumin: 3.8 g/dL (ref 3.5–5.2)
Alkaline Phosphatase: 110 U/L — ABNORMAL HIGH (ref 35–104)
Bilirubin, Direct: <0.08 mg/dL (ref <0.31)
Total Bilirubin: 0.28 mg/dL — ABNORMAL LOW (ref 0.3–1.2)
Total Protein: 6.9 g/dL (ref 6.4–8.3)

## 2018-10-16 LAB — LIPASE: Lipase: 30 U/L (ref 13–60)

## 2018-10-16 MED ORDER — NORMAL SALINE FLUSH 0.9 % IV SOLN
0.9 % | Freq: Once | INTRAVENOUS | Status: AC
Start: 2018-10-16 — End: 2018-10-16
  Administered 2018-10-16: 21:00:00 10 mL via INTRAVENOUS

## 2018-10-16 MED ORDER — ALUM & MAG HYDROXIDE-SIMETH 200-200-20 MG/5ML PO SUSP
200-200-20 MG/5ML | Freq: Once | ORAL | Status: DC
Start: 2018-10-16 — End: 2018-10-16

## 2018-10-16 MED ORDER — SODIUM CHLORIDE (PF) 0.9 % IJ SOLN
0.9 % | Freq: Once | INTRAMUSCULAR | Status: AC
Start: 2018-10-16 — End: 2018-10-16
  Administered 2018-10-16: 21:00:00 10 mL via INTRAVENOUS

## 2018-10-16 MED ORDER — PANTOPRAZOLE SODIUM 20 MG PO TBEC
20 MG | ORAL_TABLET | Freq: Every day | ORAL | 0 refills | Status: DC
Start: 2018-10-16 — End: 2018-11-16

## 2018-10-16 MED ORDER — SODIUM CHLORIDE 0.9 % IV BOLUS
0.9 % | Freq: Once | INTRAVENOUS | Status: AC
Start: 2018-10-16 — End: 2018-10-16
  Administered 2018-10-16: 21:00:00 80 mL via INTRAVENOUS

## 2018-10-16 MED ORDER — PANTOPRAZOLE SODIUM 40 MG IV SOLR
40 MG | Freq: Once | INTRAVENOUS | Status: AC
Start: 2018-10-16 — End: 2018-10-16
  Administered 2018-10-16: 21:00:00 40 mg via INTRAVENOUS

## 2018-10-16 MED ORDER — IOPAMIDOL 76 % IV SOLN
76 % | Freq: Once | INTRAVENOUS | Status: AC | PRN
Start: 2018-10-16 — End: 2018-10-16
  Administered 2018-10-16: 21:00:00 75 mL via INTRAVENOUS

## 2018-10-16 MED FILL — PANTOPRAZOLE SODIUM 40 MG IV SOLR: 40 MG | INTRAVENOUS | Qty: 40

## 2018-10-16 MED FILL — MAG-AL PLUS 200-200-20 MG/5ML PO LIQD: 200-200-20 MG/5ML | ORAL | Qty: 30

## 2018-10-16 NOTE — ED Provider Notes (Signed)
[facility]  eMERGENCY dEPARTMENT eNCOUnter      Pt Name: Carolyn Crane  MRN: [id number]  Birthdate 02/06/1976  Date of evaluation: 10/16/2018  Provider: Carolyn StareKristin M Madeline Bebout, APRN - CNP    CHIEF COMPLAINT       Chief Complaint   Patient presents with   ??? Gastroesophageal Reflux         HISTORY OF PRESENT ILLNESS  (Location/Symptom, Timing/Onset, Context/Setting, Quality, Duration, Modifying Factors, Severity.)   Carolyn Crane is a 43 y.o. female who presents to the emergency department via private auto for N/V, upper abdominal pain. Onset was a few weeks ago. It became worse the past few days. States she has a history of GERD. Her pcp placed her on Prevacid, but it was not covered by her insurance. States she has had Protonix in the past. She had a cholecystectomy years ago. Denies fever, chills, diarrhea, urinary symptoms, SOB. Rates her pain 6/10 at this time.       Nursing Notes were reviewed.    ALLERGIES     Prochlorperazine; Reglan [metoclopramide]; and Zofran [ondansetron hcl]    CURRENT MEDICATIONS       Discharge Medication List as of 10/16/2018  5:21 PM      CONTINUE these medications which have NOT CHANGED    Details   furosemide (LASIX) 20 MG tablet Take 1 tablet by mouth daily for 3 days, Disp-3 tablet, R-0Print      escitalopram (LEXAPRO) 10 MG tablet Take 10 mg by mouth daily Historical Med      Liraglutide (VICTOZA SC) Inject 1.8 Units into the skin daily Historical Med      atorvastatin (LIPITOR) 80 MG tablet Take 80 mg by mouth dailyHistorical Med      ibuprofen (ADVIL;MOTRIN) 800 MG tablet Take 800 mg by mouthHistorical Med      valACYclovir (VALTREX) 500 MG tablet Take 1,000 mg by mouth 2 times daily as needed Historical Med      fenofibrate 160 MG tablet Take 160 mg by mouth daily Historical Med      Insulin Degludec (TRESIBA FLEXTOUCH) 200 UNIT/ML SOPN Inject 100 Units into the skin every morning Historical Med      ARIPiprazole (ABILIFY) 15 MG tablet Take 30 mg by mouth daily  Historical Med             PAST MEDICAL HISTORY         Diagnosis Date   ??? Bipolar 1 disorder (HCC)    ??? Depression    ??? Diabetes mellitus (HCC)    ??? Hyperlipidemia    ??? IBS (irritable bowel syndrome)    ??? PCOS (polycystic ovarian syndrome)    ??? PMR (polymyalgia rheumatica) (HCC)        SURGICAL HISTORY           Procedure Laterality Date   ??? APPENDECTOMY     ??? CHOLECYSTECTOMY     ??? FOOT SURGERY Left     plantar fascitiis release   ??? TONSILLECTOMY           FAMILY HISTORY     History reviewed. No pertinent family history.  No family status information on file.        SOCIAL HISTORY      reports that she has never smoked. She has never used smokeless tobacco. She reports current alcohol use. She reports that she does not use drugs.    REVIEW OF SYSTEMS    (2-9  systems for level 4, 10 or more for level 5)     Review of Systems   Constitutional: Negative for chills, diaphoresis, fatigue and fever.   Respiratory: Negative for cough and shortness of breath.    Cardiovascular: Negative for chest pain.   Gastrointestinal: Positive for abdominal pain, nausea and vomiting. Negative for diarrhea.   Genitourinary: Negative for dysuria, flank pain, frequency, hematuria and urgency.   Musculoskeletal: Negative for back pain.   Neurological: Negative for dizziness, weakness, light-headedness and headaches.     Except as noted above the remainder of the review of systems was reviewed and negative.     PHYSICAL EXAM    (up to 7 for level 4, 8 or more for level 5)     ED Triage Vitals   BP Temp Temp src Pulse Resp SpO2 Height Weight   10/16/18 1514 10/16/18 1514 -- 10/16/18 1514 10/16/18 1514 10/16/18 1514 10/16/18 1515 10/16/18 1515   127/78 98.1 ??F (36.7 ??C)  104 14 99 % 5\' 5"  (1.651 m) 210 lb 5 oz (95.4 kg)     Physical Exam  Vitals signs reviewed.   Constitutional:       General: She is not in acute distress.     Appearance: She is well-developed. She is not diaphoretic.   Eyes:      General: No scleral icterus.      Conjunctiva/sclera: Conjunctivae normal.   Neck:      Vascular: No JVD.   Cardiovascular:      Rate and Rhythm: Normal rate.   Pulmonary:      Effort: Pulmonary effort is normal. No respiratory distress.   Abdominal:      General: There is no distension.      Palpations: Abdomen is soft.      Tenderness: There is abdominal tenderness in the right upper quadrant and epigastric area. There is no guarding.   Musculoskeletal:      Comments: Moves extremities   Skin:     General: Skin is warm and dry.      Findings: No rash.   Neurological:      Mental Status: She is alert and oriented to person, place, and time.   Psychiatric:         Behavior: Behavior normal.         DIAGNOSTIC RESULTS     RADIOLOGY:   Non-plain film images such as CT, Ultrasound and MRI are read by the radiologist. Plain radiographic images are visualized and preliminarily interpreted by the emergency physician with the below findings:    Interpretation per the Radiologist below, if available at the time of this note:    Ct Abdomen Pelvis W Iv Contrast Additional Contrast? None    Result Date: 10/16/2018  EXAMINATION: CT OF THE ABDOMEN AND PELVIS WITH CONTRAST 10/16/2018 4:33 pm TECHNIQUE: CT of the abdomen and pelvis was performed with the administration of intravenous contrast. Multiplanar reformatted images are provided for review. Dose modulation, iterative reconstruction, and/or weight based adjustment of the mA/kV was utilized to reduce the radiation dose to as low as reasonably achievable. COMPARISON: 08/04/2016 HISTORY: ORDERING SYSTEM PROVIDED HISTORY: pain TECHNOLOGIST PROVIDED HISTORY: pain Reason for Exam: RUQ pain, nausea, diarrhea Acuity: Acute Type of Exam: Initial Relevant Medical/Surgical History: IBS FINDINGS: Lower Chest:  Visualized portion of the lower chest demonstrates no acute abnormality. Organs: Diffuse hepatic hypoattenuation without focal hepatic lesion.  Prior cholecystectomy.  No biliary ductal dilatation.  Pancreas,  spleen, adrenal glands, and kidneys are  unremarkable.  No hydronephrosis or urinary tract calculus is evident. GI/Bowel: No acute bowel abnormality.  Nonvisualized appendix.  No pericecal inflammatory changes. Pelvis: Urinary bladder and pelvic organs unremarkable. Peritoneum/Retroperitoneum: No free air or free fluid.  No acute vascular abnormality.  No abnormal lymph node. Bones/Soft Tissues: No acute osseous or soft tissue abnormality.     1. No acute abdominopelvic abnormality. 2. Diffuse hepatic steatosis.     LABS:  Labs Reviewed   BASIC METABOLIC PANEL - Abnormal; Notable for the following components:       Result Value    Glucose 189 (*)     All other components within normal limits   CBC WITH AUTO DIFFERENTIAL - Abnormal; Notable for the following components:    Seg Neutrophils 68 (*)     All other components within normal limits   HEPATIC FUNCTION PANEL - Abnormal; Notable for the following components:    Alkaline Phosphatase 110 (*)     ALT 41 (*)     Total Bilirubin 0.28 (*)     All other components within normal limits   LIPASE   HCG, SERUM, QUALITATIVE       All other labs were within normal range or not returned as of this dictation.    EMERGENCY DEPARTMENT COURSE and DIFFERENTIAL DIAGNOSIS/MDM:   Vitals:    Vitals:    10/16/18 1514 10/16/18 1515   BP: 127/78    Pulse: 104    Resp: 14    Temp: 98.1 ??F (36.7 ??C)    SpO2: 99%    Weight:  210 lb 5 oz (95.4 kg)   Height:  5\' 5"  (1.651 m)         MEDICATIONS GIVEN IN THE ED:  Medications   aluminum & magnesium hydroxide-simethicone (MAALOX) 30 mL, lidocaine viscous hcl (XYLOCAINE) 5 mL (GI COCKTAIL) ( Oral Not Given 10/16/18 1701)   pantoprazole (PROTONIX) injection 40 mg (40 mg Intravenous Given 10/16/18 1703)     And   sodium chloride (PF) 0.9 % injection 10 mL (10 mLs Intravenous Given 10/16/18 1649)   0.9 % sodium chloride bolus (0 mLs Intravenous Stopped 10/16/18 1650)   iopamidol (ISOVUE-370) 76 % injection 75 mL (75 mLs Intravenous Given 10/16/18 1648)    sodium chloride flush 0.9 % injection 10 mL (10 mLs Intravenous Given 10/16/18 1703)       CLINICAL DECISION MAKING:  The patient presented alert with a nontoxic appearance and was seen in conjunction with Dr.  Cinda QuestAbdulnabi. Laboratory studies were unremarkable. Imaging was negative for acute findings. A prescription was written for Protonix. Follow up with pcp and GI, return to ED if condition worsens.      FINAL IMPRESSION      1. Pain of upper abdomen    2. Hx of gastroesophageal reflux (GERD)            Problem List  Patient Active Problem List   Diagnosis Code   ??? Chest pain R07.9   ??? 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 Dyslipidemia E78.5         DISPOSITION/PLAN   DISPOSITION  DISCHARGE      PATIENT REFERRED TO:   Stoney BangJoanna Shaw, APRN - CNP  [address]  [place] [id number]  [phone]    Schedule an appointment as soon as possible for a visit       Tera HelperFarid U Ahmad, MD  [address]  [address]  [place]  [address]  [phone]    Schedule an appointment as soon as possible for a visit       [facility] ED  [address]  [place] [address]  [phone]          DISCHARGE MEDICATIONS:     Discharge Medication List as of 10/16/2018  5:21 PM      START taking these medications    Details   pantoprazole (PROTONIX) 20 MG tablet Take 1 tablet by mouth daily, Disp-30 tablet, R-0Print                 (Please note that portions of this note were completed with a voice recognition program.  Efforts were made to edit the dictations but occasionally words are mis-transcribed.)    Derrill Kay, APRN - [place], APRN - CNP  10/16/18 1819

## 2018-10-16 NOTE — Discharge Instructions (Signed)
Continuity of Care Form    Patient Name: Carolyn Crane   DOB:  11-Dec-1975  MRN:  [id number]    Admit date:  10/16/2018  Discharge date:  ***    Code Status Order: Prior   Advance Directives:     Admitting Physician:  No admitting provider for patient encounter.  PCP: Carolyn Horner, APRN - CNP    Discharging Nurse: [facility] Unit/Room#: STA12/12  Discharging Unit Phone Number: ***    Emergency Contact:   Extended Emergency Contact Information  Primary Emergency Contact: Gilson,Deb  Address: [address]           [place] [address]  Home Phone: [phone]  Relation: Parent  Secondary Emergency Contact: Eppie Gibson  Home Phone: [phone]  Relation: Other    Past Surgical History:  Past Surgical History:   Procedure Laterality Date   ??? APPENDECTOMY     ??? CHOLECYSTECTOMY     ??? FOOT SURGERY Left     plantar fascitiis release   ??? TONSILLECTOMY         Immunization History:     There is no immunization history on file for this patient.    Active Problems:  Patient Active Problem List   Diagnosis Code   ??? Chest pain R07.9   ??? 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 Dyslipidemia E78.5       Isolation/Infection:   Isolation          No Isolation        Patient Infection Status     None to display          Nurse Assessment:  Last Vital Signs: BP 127/78    Pulse 104    Temp 98.1 ??F (36.7 ??C)    Resp 14    Ht 5\' 5"  (1.651 m)    Wt 210 lb 5 oz (95.4 kg)    LMP 10/11/2018    SpO2 99%    BMI 35.00 kg/m??     Last documented pain score (0-10 scale): Pain Level: 6  Last Weight:   Wt Readings from Last 1 Encounters:   10/16/18 210 lb 5 oz (95.4 kg)     Mental Status:  {IP PT MENTAL STATUS:20030}    IV Access:  {MH COC IV ACCESS:304088262}    Nursing Mobility/ADLs:  Walking   {CHP DME OHYW:737106269}  Transfer  {CHP DME SWNI:627035009}  Bathing  {CHP DME FGHW:299371696}  Dressing  {CHP DME VELF:810175102}  Toileting  {CHP DME HENI:778242353}  Feeding  {CHP DME IRWE:315400867}  Med Admin  {CHP DME YPPJ:093267124}  Med  Delivery   {MH COC MED Delivery:304088264}    Wound Care Documentation and Therapy:        Elimination:  Continence:   ?? Bowel: {YES / PY:09983}  ?? Bladder: {YES / JA:25053}  Urinary Catheter: {Urinary Catheter:304088013}   Colostomy/Ileostomy/Ileal Conduit: {YES / ZJ:67341}       Date of Last BM: ***  No intake or output data in the 24 hours ending 10/16/18 1706  No intake/output data recorded.    Safety Concerns:     [place] COC Safety Concerns:304088272}    Impairments/Disabilities:      [place] COC Impairments/Disabilities:304088273}    Nutrition Therapy:  Current Nutrition Therapy:   [place] COC Diet List:304088271}    Routes of Feeding: {CHP DME Other Feedings:304088042}  Liquids: {Slp liquid thickness:30034}  Daily Fluid Restriction: {CHP DME Yes amt example:304088041}  Last Modified Barium Swallow with  Video (Video Swallowing Test): {Done Not Done YTKZ:601093235}ate:304088012}    Treatments at the Time of Hospital Discharge:   Respiratory Treatments: ***  Oxygen Therapy:  {Therapy; copd oxygen:17808}  Ventilator:    {MH CC Vent TDDU:202542706}List:304088111}    Rehab Therapies: {THERAPEUTIC INTERVENTION:[phone]}  Weight Bearing Status/Restrictions: {MH CC Weight Bearing:304508812}  Other Medical Equipment (for information only, NOT a DME order):  {EQUIPMENT:304520077}  Other Treatments: ***    Patient's personal belongings (please select all that are sent with patient):  {CHP DME Belongings:304088044}    RN SIGNATURE:  {Esignature:304088025}    CASE MANAGEMENT/SOCIAL WORK SECTION    Inpatient Status Date: ***    Readmission Risk Assessment Score:  Readmission Risk              Risk of Unplanned Readmission:        0           Discharging to Facility/ Agency   ?? Name:   ?? Address:  ?? Phone:  ?? Fax:    Dialysis Facility (if applicable)   ?? Name:  ?? Address:  ?? Dialysis Schedule:  ?? Phone:  ?? Fax:    Case Manager/Social Worker signature: {Esignature:304088025}    PHYSICIAN SECTION    Prognosis: {Prognosis:707 264 3734}    Condition at Discharge: {MH  Patient Condition:304088024}    Rehab Potential (if transferring to Rehab): {Prognosis:707 264 3734}    Recommended Labs or Other Treatments After Discharge: ***    Physician Certification: I certify the above information and transfer of [name]  is necessary for the continuing treatment of the diagnosis listed and that she requires {Admit to Appropriate Level of Care:20763} for {GREATER/LESS:304500278} 30 days.     Update Admission H&P: {CHP DME Changes in CBJSE:831517616}HandP:304088045}    PHYSICIAN SIGNATURE:  {Esignature:304088025}

## 2018-10-16 NOTE — ED Provider Notes (Signed)
eMERGENCY dEPARTMENT eNCOUnter   Independent Attestation     Pt Name: Carolyn Crane  MRN: [id number]  Birthdate [birth year]  Date of evaluation: 10/18/18     Carolyn Crane is a 43 y.o. female with CC: Gastroesophageal Reflux      Based on the medical record the care appears appropriate.  I was personally available for consultation in the Emergency Department.    Paulina Fusi, MD  Attending Emergency Physician                    Paulina Fusi, MD  10/18/18 [phone]

## 2018-10-17 LAB — POCT URINE PREGNANCY: HCG, Pregnancy Urine (POC): NEGATIVE

## 2018-11-13 ENCOUNTER — Inpatient Hospital Stay
Admit: 2018-11-13 | Discharge: 2018-11-13 | Disposition: A | Discharge status: Home / Self Care | Payer: PRIVATE HEALTH INSURANCE | Attending: Emergency Medicine

## 2018-11-13 DIAGNOSIS — E1165 Type 2 diabetes mellitus with hyperglycemia: Principal | ICD-10-CM

## 2018-11-13 LAB — CBC WITH AUTO DIFFERENTIAL
Absolute Eos #: 0.04 10*3/uL (ref 0.00–0.44)
Absolute Immature Granulocyte: 0.04 10*3/uL (ref 0.00–0.30)
Absolute Lymph #: 1.58 10*3/uL (ref 1.10–3.70)
Absolute Mono #: 0.64 10*3/uL (ref 0.10–1.20)
Basophils Absolute: 0.07 10*3/uL (ref 0.00–0.20)
Basophils: 1 % (ref 0–2)
Eosinophils %: 0 % — ABNORMAL LOW (ref 1–4)
Hematocrit: 38.7 % (ref 36.3–47.1)
Hemoglobin: 12.7 g/dL (ref 11.9–15.1)
Immature Granulocytes: 0 %
Lymphocytes: 15 % — ABNORMAL LOW (ref 24–43)
MCH: 30.8 pg (ref 25.2–33.5)
MCHC: 32.8 g/dL (ref 28.4–34.8)
MCV: 93.9 fL (ref 82.6–102.9)
MPV: 9.6 fL (ref 8.1–13.5)
Monocytes: 6 % (ref 3–12)
NRBC Automated: 0 per 100 WBC
Platelets: 344 10*3/uL (ref 138–453)
RBC: 4.12 m/uL (ref 3.95–5.11)
RDW: 12.9 % (ref 11.8–14.4)
Seg Neutrophils: 78 % — ABNORMAL HIGH (ref 36–65)
Segs Absolute: 7.91 10*3/uL (ref 1.50–8.10)
WBC: 10.3 10*3/uL (ref 3.5–11.3)

## 2018-11-13 LAB — URINALYSIS WITH MICROSCOPIC
Bilirubin Urine: NEGATIVE
Epithelial Cells UA: 5 /HPF (ref 0–5)
Leukocyte Esterase, Urine: NEGATIVE
Nitrite, Urine: NEGATIVE
Protein, UA: NEGATIVE
Specific Gravity, UA: 1.025 (ref 1.005–1.030)
Urine Hgb: NEGATIVE
Urobilinogen, Urine: NORMAL
pH, UA: 6 (ref 5.0–8.0)

## 2018-11-13 LAB — COMPREHENSIVE METABOLIC PANEL W/ REFLEX TO MG FOR LOW K
ALT: 27 U/L (ref 5–33)
AST: 16 U/L (ref <32)
Albumin: 3.8 g/dL (ref 3.5–5.2)
Alkaline Phosphatase: 128 U/L — ABNORMAL HIGH (ref 35–104)
Anion Gap: 14 mmol/L (ref 9–17)
BUN: 10 mg/dL (ref 6–20)
Bun/Cre Ratio: 15 (ref 9–20)
CO2: 23 mmol/L (ref 20–31)
Calcium: 9.7 mg/dL (ref 8.6–10.4)
Chloride: 98 mmol/L (ref 98–107)
Creatinine: 0.65 mg/dL (ref 0.50–0.90)
GFR African American: >60 mL/min (ref >60)
GFR Non-African American: >60 mL/min (ref >60)
Glucose: 274 mg/dL — ABNORMAL HIGH (ref 70–99)
Potassium: 4.1 mmol/L (ref 3.7–5.3)
Sodium: 135 mmol/L (ref 135–144)
Total Bilirubin: 0.19 mg/dL — ABNORMAL LOW (ref 0.3–1.2)
Total Protein: 7.4 g/dL (ref 6.4–8.3)

## 2018-11-13 LAB — AMYLASE: Amylase: 29 U/L (ref 28–100)

## 2018-11-13 LAB — POC PANEL (G3)
HCO3: 22.7 mmol/L — ABNORMAL LOW (ref 23–31)
Negative Base Excess: 2 (ref 0.0–2.0)
O2 Sat: 75 %
PCO2: 35 mm Hg
PO2: 39 mm Hg
POC TCO2: 24 mmol/L
pH: 7.42

## 2018-11-13 LAB — POC GLUCOSE FINGERSTICK
POC Glucose: 296 mg/dL — ABNORMAL HIGH (ref 65–105)
POC Glucose: 210 mg/dL — ABNORMAL HIGH (ref 65–105)

## 2018-11-13 LAB — LIPASE: Lipase: 28 U/L (ref 13–60)

## 2018-11-13 LAB — POCT URINE PREGNANCY: Preg Test, Ur: NEGATIVE

## 2018-11-13 LAB — BETA-HYDROXYBUTYRATE: Beta-Hydroxybutyrate: 0.1 mmol/L (ref 0.02–0.27)

## 2018-11-13 MED ORDER — SODIUM CHLORIDE 0.9 % IV BOLUS
0.9 % | Freq: Once | INTRAVENOUS | Status: AC
Start: 2018-11-13 — End: 2018-11-13
  Administered 2018-11-13: 14:00:00 1000 mL via INTRAVENOUS

## 2018-11-13 NOTE — ED Notes (Signed)
Dr. Mitzi Davenport at pt bedside.      Charm Rings, RN  11/13/18 1102

## 2018-11-13 NOTE — ED Provider Notes (Signed)
EMERGENCY DEPARTMENT ENCOUNTER    Pt Name: Carolyn MooreKara M Lofquist  MRN: [id number]  Birthdate 07/04/1975  Date of evaluation: 11/13/18  CHIEF COMPLAINT       Chief Complaint   Patient presents with   ??? Hyperglycemia     HISTORY OF PRESENT ILLNESS   HPI   The patient is a 43 year old female with a history of diabetes who presented to the emergency department secondary to hyperglycemia.  Patient said over last several days she has had poorly controlled blood sugars 1 reading last week was 440 today it was 330.  On arrival in the emergency department to 96.  Patient takes Evaristo Buryresiba, been to her and Newman Nipmador however she is out of her NovoLog which is long-acting.  She is under care of her primary care doctor who has been managing her diabetes.  She is previously seen endocrinologist but not feels that they were helping her so she is been managed well by her primary care doctor.  Patient feels she is tired and weak.  She was seen in outside hospital for similar symptomology and feels that they have missed something that something is wrong.  Patient denies chest pain, shortness of breath, fevers or chills.    REVIEW OF SYSTEMS     Review of Systems   Constitutional: Negative for chills, diaphoresis and fever.   HENT: Negative for congestion, ear pain and facial swelling.    Eyes: Negative for pain, discharge and visual disturbance.   Respiratory: Negative for chest tightness and shortness of breath.    Cardiovascular: Negative for chest pain and palpitations.   Gastrointestinal: Positive for nausea. Negative for abdominal distention and abdominal pain.   Genitourinary: Negative for difficulty urinating and flank pain.   Musculoskeletal: Negative for back pain.   Skin: Negative for wound.   Neurological: Negative for dizziness, light-headedness and headaches.     PASTMEDICAL HISTORY     Past Medical History:   Diagnosis Date   ??? Bipolar 1 disorder (HCC)    ??? Depression    ??? Diabetes mellitus (HCC)    ??? Hyperlipidemia    ??? IBS (irritable  bowel syndrome)    ??? PCOS (polycystic ovarian syndrome)    ??? PMR (polymyalgia rheumatica) (HCC)      SURGICAL HISTORY       Past Surgical History:   Procedure Laterality Date   ??? APPENDECTOMY     ??? CHOLECYSTECTOMY     ??? FOOT SURGERY Left     plantar fascitiis release   ??? TONSILLECTOMY       CURRENT MEDICATIONS       Previous Medications    ARIPIPRAZOLE (ABILIFY) 15 MG TABLET    Take 30 mg by mouth daily     ATORVASTATIN (LIPITOR) 80 MG TABLET    Take 80 mg by mouth daily    ESCITALOPRAM (LEXAPRO) 10 MG TABLET    Take 10 mg by mouth daily     FENOFIBRATE 160 MG TABLET    Take 160 mg by mouth daily     FUROSEMIDE (LASIX) 20 MG TABLET    Take 1 tablet by mouth daily for 3 days    IBUPROFEN (ADVIL;MOTRIN) 800 MG TABLET    Take 800 mg by mouth    INSULIN DEGLUDEC (TRESIBA FLEXTOUCH) 200 UNIT/ML SOPN    Inject 100 Units into the skin every morning     LIRAGLUTIDE (VICTOZA SC)    Inject 1.8 Units into the skin daily     PANTOPRAZOLE (PROTONIX)  20 MG TABLET    Take 1 tablet by mouth daily    VALACYCLOVIR (VALTREX) 500 MG TABLET    Take 1,000 mg by mouth 2 times daily as needed      ALLERGIES     is allergic to prochlorperazine; reglan [metoclopramide]; and zofran [ondansetron hcl].  FAMILY HISTORY     has no family status information on file.      SOCIAL HISTORY       Social History     Tobacco Use   ??? Smoking status: Never Smoker   ??? Smokeless tobacco: Never Used   Substance Use Topics   ??? Alcohol use: Yes     Comment: socially   ??? Drug use: No     PHYSICAL EXAM     INITIAL VITALS: BP 131/83    Pulse 91    Temp 98.4 ??F (36.9 ??C)    Resp 14    Ht 5\' 6"  (1.676 m)    Wt 206 lb 12.8 oz (93.8 kg)    LMP 11/07/2018    SpO2 97%    BMI 33.38 kg/m??    Physical Exam  Vitals signs and nursing note reviewed.   Constitutional:       General: She is not in acute distress.     Appearance: She is well-developed. She is not diaphoretic.   HENT:      Head: Normocephalic and atraumatic.   Eyes:      Pupils: Pupils are equal, round, and  reactive to light.   Neck:      Musculoskeletal: Normal range of motion and neck supple.   Cardiovascular:      Rate and Rhythm: Normal rate and regular rhythm.   Pulmonary:      Effort: Pulmonary effort is normal.      Breath sounds: Normal breath sounds.   Abdominal:      General: Bowel sounds are normal.      Palpations: Abdomen is soft.   Musculoskeletal: Normal range of motion.   Skin:     General: Skin is warm.      Capillary Refill: Capillary refill takes less than 2 seconds.   Neurological:      Mental Status: She is alert and oriented to person, place, and time.         MEDICAL DECISION MAKING:   Patient is a 43 year old female with history of diabetes who presented to the emergency department secondary to hyperglycemia.  Blood sugar on arrival to 96.  Patient was given 1 L fluid bolus and basic labs to rule out DKA and will be reevaluated.  Patient is allergic to Zofran and Reglan as well as promethazine, she states she normally just lives with her nausea.  Laboratory analysis no evidence of DKA, repeat blood sugar 212 after a one liter fluid bolus.  Patient was given outpatient diabetic education follow-up as well as a list of over-the-counter nausea medications.  Given outpatient follow-up with primary care doctor and parameters to return to the emergency department.         All patient's question's and concerns were answered prior to disposition and patient and/or family expressed understanding and agreement of treatment plan.        CRITICAL CARE:              NIH STROKE SCALE:            PROCEDURES:    Procedures    DIAGNOSTIC RESULTS   EKG:All EKG's are interpreted by  the Emergency Department Physician who either signs or Co-signs this chart in the absence of a cardiologist.        RADIOLOGY:All plain film, CT, MRI, and formal ultrasound images (except ED bedside ultrasound) are read by the radiologist, see reports below, unless otherwisenoted in MDM or here.  No orders to display     LABS: All lab  results were reviewed by myself, and all abnormals are listed below.  Labs Reviewed   COMPREHENSIVE METABOLIC PANEL W/ REFLEX TO MG FOR LOW K - Abnormal; Notable for the following components:       Result Value    Glucose 274 (*)     Alkaline Phosphatase 128 (*)     Total Bilirubin 0.19 (*)     All other components within normal limits   URINALYSIS WITH MICROSCOPIC - Abnormal; Notable for the following components:    Glucose, Ur 3+ (*)     Ketones, Urine 1+ (*)     All other components within normal limits   CBC WITH AUTO DIFFERENTIAL - Abnormal; Notable for the following components:    Seg Neutrophils 78 (*)     Lymphocytes 15 (*)     Eosinophils % 0 (*)     All other components within normal limits   POC PANEL (G3) - Abnormal; Notable for the following components:    HCO3 22.7 (*)     All other components within normal limits   POC GLUCOSE FINGERSTICK - Abnormal; Notable for the following components:    POC Glucose 296 (*)     All other components within normal limits   LIPASE   AMYLASE   BETA-HYDROXYBUTYRATE   BLOOD GAS, VENOUS   POCT URINE PREGNANCY       EMERGENCY DEPARTMENTCOURSE:         Vitals:    Vitals:    11/13/18 0920 11/13/18 0923   BP: 131/83    Pulse: 91    Resp: 14    Temp: 98.4 ??F (36.9 ??C)    SpO2: 97%    Weight:  206 lb 12.8 oz (93.8 kg)   Height:  5\' 6"  (1.676 m)       The patient was given the following medications while in the emergency department:  Orders Placed This Encounter   Medications   ??? 0.9 % sodium chloride bolus     CONSULTS:  None    FINAL IMPRESSION      1. Hyperglycemia          DISPOSITION/PLAN   DISPOSITION Decision To Discharge 11/13/2018 10:58:51 AM      PATIENT REFERRED TO:  Stoney BangJoanna Shaw, APRN - CNP  [address]  [place] [id number]  [phone]    Schedule an appointment as soon as possible for a visit       Stoney BangJoanna Shaw, APRN - CNP  [address]  [place] [id number]  [phone]    Schedule an appointment as soon as possible for a visit in 1  day      DISCHARGE MEDICATIONS:  New Prescriptions    No medications on file     Ethelene BrownsAlicia Mariateresa Batra, MD  Attending Emergency Physician    This note was created with the assistance of a speech-recognition program. While intending to generate a document that actually reflects the content of the visit, no guarantees can be provided that every mistake has been identified and corrected by editing.  Ethelene BrownsAlicia Rorey Hodges, MD  11/13/18 1059

## 2018-11-13 NOTE — ED Notes (Signed)
Blood sugar checked. [address], RN  11/13/18 1102

## 2018-11-13 NOTE — ED Notes (Signed)
Pt presents to er with c/o elevated blood sugar. Pt states she has had elevated blood sugars for past 2-3 days. Pt states she feels nauseated. Pt states she recently started new medication for diabetes. Pt a&ox3. Skin warm and dry. Respirations even and non-labored.      Charm Rings, RN  11/13/18 1110

## 2018-11-16 ENCOUNTER — Inpatient Hospital Stay
Admit: 2018-11-16 | Discharge: 2018-11-16 | Disposition: A | Discharge status: Home / Self Care | Payer: PRIVATE HEALTH INSURANCE | Attending: Emergency Medicine

## 2018-11-16 DIAGNOSIS — R1084 Generalized abdominal pain: Secondary | ICD-10-CM

## 2018-11-16 LAB — URINALYSIS WITH REFLEX TO CULTURE
Bilirubin Urine: NEGATIVE
Leukocyte Esterase, Urine: NEGATIVE
Nitrite, Urine: NEGATIVE
Specific Gravity, UA: 1.03 (ref 1.005–1.030)
Urobilinogen, Urine: NORMAL
pH, UA: 5.5 (ref 5.0–8.0)

## 2018-11-16 LAB — CBC WITH AUTO DIFFERENTIAL
Absolute Eos #: 0.07 10*3/uL (ref 0.00–0.44)
Absolute Immature Granulocyte: 0.03 10*3/uL (ref 0.00–0.30)
Absolute Lymph #: 1.99 10*3/uL (ref 1.10–3.70)
Absolute Mono #: 0.47 10*3/uL (ref 0.10–1.20)
Basophils Absolute: 0.04 10*3/uL (ref 0.00–0.20)
Basophils: 1 % (ref 0–2)
Eosinophils %: 1 % (ref 1–4)
Hematocrit: 37.8 % (ref 36.3–47.1)
Hemoglobin: 12.6 g/dL (ref 11.9–15.1)
Immature Granulocytes: 0 %
Lymphocytes: 24 % (ref 24–43)
MCH: 30.9 pg (ref 25.2–33.5)
MCHC: 33.3 g/dL (ref 28.4–34.8)
MCV: 92.6 fL (ref 82.6–102.9)
MPV: 9.8 fL (ref 8.1–13.5)
Monocytes: 6 % (ref 3–12)
NRBC Automated: 0 per 100 WBC
Platelets: 377 10*3/uL (ref 138–453)
RBC: 4.08 m/uL (ref 3.95–5.11)
RDW: 12.8 % (ref 11.8–14.4)
Seg Neutrophils: 68 % — ABNORMAL HIGH (ref 36–65)
Segs Absolute: 5.73 10*3/uL (ref 1.50–8.10)
WBC: 8.3 10*3/uL (ref 3.5–11.3)

## 2018-11-16 LAB — MICROSCOPIC URINALYSIS
Epithelial Cells UA: 5 /HPF (ref 0–5)
WBC, UA: 0 /HPF (ref 0–5)

## 2018-11-16 LAB — COMPREHENSIVE METABOLIC PANEL W/ REFLEX TO MG FOR LOW K
ALT: 32 U/L (ref 5–33)
AST: 25 U/L (ref <32)
Albumin: 3.8 g/dL (ref 3.5–5.2)
Alkaline Phosphatase: 111 U/L — ABNORMAL HIGH (ref 35–104)
Anion Gap: 13 mmol/L (ref 9–17)
BUN: 12 mg/dL (ref 6–20)
Bun/Cre Ratio: 17 (ref 9–20)
CO2: 25 mmol/L (ref 20–31)
Calcium: 9.4 mg/dL (ref 8.6–10.4)
Chloride: 99 mmol/L (ref 98–107)
Creatinine: 0.7 mg/dL (ref 0.50–0.90)
GFR African American: >60 mL/min (ref >60)
GFR Non-African American: >60 mL/min (ref >60)
Glucose: 183 mg/dL — ABNORMAL HIGH (ref 70–99)
Potassium: 4.3 mmol/L (ref 3.7–5.3)
Sodium: 137 mmol/L (ref 135–144)
Total Bilirubin: 0.22 mg/dL — ABNORMAL LOW (ref 0.3–1.2)
Total Protein: 7 g/dL (ref 6.4–8.3)

## 2018-11-16 LAB — HCG, SERUM, QUALITATIVE: hCG Qual: NEGATIVE

## 2018-11-16 LAB — LIPASE: Lipase: 25 U/L (ref 13–60)

## 2018-11-16 MED ORDER — DICYCLOMINE HCL 10 MG PO CAPS
10 MG | ORAL_CAPSULE | Freq: Four times a day (QID) | ORAL | 0 refills | Status: DC | PRN
Start: 2018-11-16 — End: 2019-02-22

## 2018-11-16 MED ORDER — DICYCLOMINE HCL 10 MG/ML IM SOLN
10 MG/ML | Freq: Once | INTRAMUSCULAR | Status: AC
Start: 2018-11-16 — End: 2018-11-16
  Administered 2018-11-16: 21:00:00 20 mg via INTRAMUSCULAR

## 2018-11-16 MED ORDER — FAMOTIDINE 20 MG/2ML IV SOLN
202 MG/2ML | Freq: Once | INTRAVENOUS | Status: AC
Start: 2018-11-16 — End: 2018-11-16
  Administered 2018-11-16: 21:00:00 20 mg via INTRAVENOUS

## 2018-11-16 MED ORDER — SODIUM CHLORIDE 0.9 % IV BOLUS
0.9 % | Freq: Once | INTRAVENOUS | Status: AC
Start: 2018-11-16 — End: 2018-11-16
  Administered 2018-11-16: 21:00:00 1000 mL via INTRAVENOUS

## 2018-11-16 MED FILL — BENTYL 10 MG/ML IM SOLN: 10 MG/ML | INTRAMUSCULAR | Qty: 2

## 2018-11-16 MED FILL — FAMOTIDINE 20 MG/2ML IV SOLN: 20 MG/2ML | INTRAVENOUS | Qty: 2

## 2018-11-16 NOTE — ED Notes (Signed)
Pt ambulatory to room 6 with c/o lower abd pain that radiates into upper abd, onset 2 days ago. Pt reports hx of IBS, states "I usually have mild cramping and diarrhea, but this is so bad it's been bringing me to my knees". Pt relates she has had about 6 episodes of diarrhea in the past 2 days. Pt denies any back pain, N/V(States "I've had puke burps" and "Little bit of acid reflux type stuff that comes up in my mouth"), urinary symptoms, or fevers. Abd soft, rounded, BS active x 4 quads. Respirations even and non labored. Skin PWD, MM slightly dry. NAD noted.      Beau Fanny, RN  11/16/18 1650

## 2018-11-16 NOTE — Telephone Encounter (Addendum)
Received referral for diabetes education after ED visit on 7/19. Attempted to call pt to schedule appt, but no answer and voicemail was not set up.    - Spoke with patient 02/23/19 - refused diabetes education

## 2018-11-16 NOTE — ED Notes (Signed)
Report given to M. Shiela Mayer, Charity fundraiser.      Beau Fanny, RN  11/16/18 1840

## 2018-11-16 NOTE — Discharge Instructions (Signed)
Call today or tomorrow to follow up with Carolyn All, APRN - CNP  in 3 days.    Avoid eating any spicy food and milk type products, drinks that have caffeine in it.  Use ibuprofen or Tylenol (unless prescribed medications that have Tylenol in it) for pain.  You can take over the counter Ibuprofen (advil) tablets (4 every 8 hours or 3 every 6 hours or 2 every 4 hours)    Return to the Emergency Department within 8 - 12 hours if you have any of the following: worsening of pain in your abdomen, no food sounds good to you, you continue to vomit, you develop a fever, pain goes to your back, or you have pain in the abdomen when going over a bump in the car or when you jump up and down, or you develop vaginal bleeding or discharge, or for any other concerns you may have.

## 2018-11-16 NOTE — ED Provider Notes (Signed)
EMERGENCY DEPARTMENT ENCOUNTER    Pt Name: Carolyn Crane  MRN: [id number]  [place] [birth year]  Date of evaluation: 11/16/18  CHIEF COMPLAINT       Chief Complaint   Patient presents with   ??? Abdominal Pain     HISTORY OF PRESENT ILLNESS   Patient is a 43 year old female here with abdominal pain.  Patient states the pain is diffuse around the epigastrium and umbilical region, described as aching and crampy but intermittently gets more sharp and severe over the past 4 days.  She states she has had some upset stomach and nausea but no vomiting.  No fevers, mild chills no neck pain or stiffness or headache.  No chest pain no shortness of breath.  She is diabetic and states her sugars have been higher than normal, was recently here for hyperglycemia.  States she did not check her glucose today due to her abdominal pain.  States she has had loose stools, no dark or bloody stools.  States she is on her menstrual cycle, denies any dysuria, has had some frequency.  Denies any vaginal discharge.  States the pain does not radiate anywhere.  Has had prior cholecystectomy and appendectomy.  Does have irritable bowel syndrome she states been no history of inflammatory bowel disease.    REVIEW OF SYSTEMS     Review of Systems   Constitutional: Negative for chills and fever.   HENT: Negative for trouble swallowing.    Eyes: Negative for visual disturbance.   Respiratory: Negative for shortness of breath.    Cardiovascular: Negative for chest pain.   Gastrointestinal: Positive for abdominal pain, diarrhea and nausea. Negative for blood in stool and vomiting.   Genitourinary: Positive for hematuria. Negative for difficulty urinating and dysuria.   Musculoskeletal: Negative for neck pain.   Skin: Negative for rash.   Neurological: Negative for weakness.   Psychiatric/Behavioral: Negative for confusion.     PASTMEDICAL HISTORY     Past Medical History:   Diagnosis Date   ??? Bipolar 1 disorder ([place])    ??? Depression    ??? Diabetes mellitus  ([place])    ??? Fibromyalgia    ??? Hyperlipidemia    ??? IBS (irritable bowel syndrome)    ??? PCOS (polycystic ovarian syndrome)      SURGICAL HISTORY       Past Surgical History:   Procedure Laterality Date   ??? APPENDECTOMY     ??? CHOLECYSTECTOMY     ??? FOOT SURGERY Left     plantar fascitiis release   ??? SHOULDER SURGERY Left    ??? TONSILLECTOMY       CURRENT MEDICATIONS       Discharge Medication List as of 11/16/2018  6:59 PM      CONTINUE these medications which have NOT CHANGED    Details   lansoprazole (PREVACID) 30 MG delayed release capsule Take 30 mg by mouth dailyHistorical Med      escitalopram (LEXAPRO) 10 MG tablet Take 10 mg by mouth daily Historical Med      Liraglutide (VICTOZA SC) Inject 1.8 Units into the skin daily Historical Med      atorvastatin (LIPITOR) 80 MG tablet Take 80 mg by mouth dailyHistorical Med      valACYclovir (VALTREX) 500 MG tablet Take 1,000 mg by mouth 2 times daily as needed Historical Med      Insulin Degludec (TRESIBA FLEXTOUCH) 200 UNIT/ML SOPN Inject 100 Units into the skin every morning Historical Med  ARIPiprazole (ABILIFY) 15 MG tablet Take 30 mg by mouth daily Historical Med      fenofibrate 160 MG tablet Take 160 mg by mouth daily Historical Med           ALLERGIES     is allergic to prochlorperazine; reglan [metoclopramide]; and zofran [ondansetron hcl].  FAMILY HISTORY     has no family status information on file.      SOCIAL HISTORY       Social History     Tobacco Use   ??? Smoking status: Never Smoker   ??? Smokeless tobacco: Never Used   Substance Use Topics   ??? Alcohol use: Yes     Comment: socially   ??? Drug use: No     PHYSICAL EXAM     INITIAL VITALS: BP 122/79    Pulse 93    Temp 97.9 ??F (36.6 ??C) (Oral)    Resp 16    LMP 11/15/2018    SpO2 98%    Physical Exam  Vitals signs and nursing note reviewed.   Constitutional:       General: She is not in acute distress.     Appearance: She is not ill-appearing, toxic-appearing or diaphoretic.   HENT:      Head: Normocephalic  and atraumatic.      Mouth/Throat:      Mouth: Mucous membranes are moist.      Pharynx: Oropharynx is clear.   Eyes:      General: No scleral icterus.     Extraocular Movements: Extraocular movements intact.      Pupils: Pupils are equal, round, and reactive to light.   Neck:      Musculoskeletal: Normal range of motion and neck supple. No neck rigidity.   Cardiovascular:      Rate and Rhythm: Normal rate and regular rhythm.      Pulses: Normal pulses.   Pulmonary:      Effort: Pulmonary effort is normal. No respiratory distress.      Breath sounds: Normal breath sounds.   Abdominal:      General: A surgical scar is present. There is no distension or abdominal bruit.      Palpations: Abdomen is soft. There is no mass or pulsatile mass.      Tenderness: There is generalized abdominal tenderness. There is no right CVA tenderness, left CVA tenderness, guarding or rebound.      Hernia: No hernia is present.   Musculoskeletal: Normal range of motion.         General: No deformity.      Right lower leg: No edema.      Left lower leg: No edema.   Skin:     General: Skin is warm and dry.      Capillary Refill: Capillary refill takes less than 2 seconds.      Findings: No rash.   Neurological:      General: No focal deficit present.      Mental Status: She is alert and oriented to person, place, and time.   Psychiatric:         Thought Content: Thought content normal.         MEDICAL DECISION MAKING:          Please see ED Course below for MDM/ED course.    DDx: Pancreatitis, kidney stone, DKA, UTI, PID, ectopic pregnancy, intra-abdominal abscess    All patient's question's and concerns were answered prior to disposition and patient and/or family  expressed understanding and agreement of treatment plan.       NIH STROKE SCALE:            PROCEDURES:    Procedures    DIAGNOSTIC RESULTS   EKG:All EKG's are interpreted by the Emergency Department Physician who either signs or Co-signs this chart in the absence of a  cardiologist.    EKG Interpretation    Interpreted by me    Rhythm: normal sinus   Rate: normal  Axis: normal  Ectopy: none  Conduction: normal  ST Segments: no acute change  T Waves: no acute change  Q Waves: none    Clinical Impression: no acute changes and normal EKG    RADIOLOGY:All plain film, CT, MRI, and formal ultrasound images (except ED bedside ultrasound) are read by the radiologist, see reports below, unless otherwisenoted in MDM or here.  No orders to display     LABS: All lab results were reviewed by myself, and all abnormals are listed below.  Labs Reviewed   CBC WITH AUTO DIFFERENTIAL - Abnormal; Notable for the following components:       Result Value    Seg Neutrophils 68 (*)     All other components within normal limits   COMPREHENSIVE METABOLIC PANEL W/ REFLEX TO MG FOR LOW K - Abnormal; Notable for the following components:    Glucose 183 (*)     Alkaline Phosphatase 111 (*)     Total Bilirubin 0.22 (*)     All other components within normal limits   URINE RT REFLEX TO CULTURE - Abnormal; Notable for the following components:    Turbidity UA CLOUDY (*)     Glucose, Ur 1+ (*)     Ketones, Urine TRACE (*)     Urine Hgb 3+ (*)     Protein, UA 1+ (*)     All other components within normal limits   MICROSCOPIC URINALYSIS - Abnormal; Notable for the following components:    Bacteria, UA FEW (*)     All other components within normal limits   LIPASE   HCG, SERUM, QUALITATIVE       EMERGENCY DEPARTMENTCOURSE:     Patient is a 43 year old female here with diffuse crampy abdominal pain for 4 days, some nausea and loose stools.  History of IBS.  Recent visit here for hyperglycemia as well as the day after at [facility] for chest pain, states she had negative work-up there.  On exam here no distress she is afebrile nontoxic vitals are stable heart sounds regular lungs clear mucous membranes moist normal capillary refill no flank tenderness no pulsatile mass mild epigastric tenderness no rebound or  guarding or peritoneal signs no pulsatile mass.  Impression is abdominal pain loose stools, will give Pepcid Bentyl fluids, check lab work, at this time does not have surgical abdomen, will check pregnancy test as well and reassess.  She did have CT scan last month which showed no acute pathology and diffuse hepatic steatosis.    Patient reassessed, abdomen remains benign, she states the cramping did improve with the Bentyl and Pepcid.  No vomiting while here.  She is tolerating oral intake.  Labs unremarkable, urine no leukocytes or nitrates to suggest UTI.  Negative pregnancy.  Patient stable for discharge with outpatient follow-up, instructed to follow-up in the next 3 days, will give Bentyl, she is on lansoprazole and instructed to continue that and stay hydrated.  Return if symptoms worsen.  Do not believe she needs  CT imaging at this time.    Vitals:    Vitals:    11/16/18 1628   BP: 122/79   Pulse: 93   Resp: 16   Temp: 97.9 ??F (36.6 ??C)   TempSrc: Oral   SpO2: 98%       The patient was given the following medications while in the emergency department:  Orders Placed This Encounter   Medications   ??? 0.9 % sodium chloride bolus   ??? famotidine (PEPCID) injection 20 mg   ??? dicyclomine (BENTYL) injection 20 mg   ??? dicyclomine (BENTYL) 10 MG capsule     Sig: Take 1 capsule by mouth every 6 hours as needed (cramps)     Dispense:  20 capsule     Refill:  0     CONSULTS:  None    FINAL IMPRESSION      1. Generalized abdominal pain          DISPOSITION/PLAN   DISPOSITION Decision To Discharge 11/16/2018 06:59:02 PM      PATIENT REFERRED TO:  Stoney BangJoanna Shaw, APRN - CNP  [address]  [place] [id number]  [phone]    Schedule an appointment as soon as possible for a visit in 3 days      [facility]  [address]  [place] [id number]  [phone]    If symptoms worsen    DISCHARGE MEDICATIONS:  Discharge Medication List as of 11/16/2018  6:59 PM      START taking these medications    Details    dicyclomine (BENTYL) 10 MG capsule Take 1 capsule by mouth every 6 hours as needed (cramps), Disp-20 capsule,R-0Print           Arlan OrganMichael Shamaya Kauer, MD  Attending Emergency Physician    This note was created with the assistance of a speech-recognition program. While intending to generate a document that actually reflects the content of the visit, no guarantees can be provided that every mistake has been identified and corrected by editing.                    Arlan OrganMichael Jakeb Lamping, MD  11/16/18 Ernestina [id number]

## 2018-11-17 LAB — EKG 12-LEAD
Atrial Rate: 76 {beats}/min
P Axis: 32 degrees
P-R Interval: 158 ms
Q-T Interval: 372 ms
QRS Duration: 86 ms
QTc Calculation (Bazett): 418 ms
R Axis: 70 degrees
T Axis: 47 degrees
Ventricular Rate: 76 {beats}/min

## 2018-11-17 LAB — POCT URINE PREGNANCY: HCG, Pregnancy Urine (POC): NEGATIVE

## 2019-02-22 ENCOUNTER — Inpatient Hospital Stay
Admit: 2019-02-22 | Discharge: 2019-02-22 | Disposition: A | Discharge status: Home / Self Care | Payer: PRIVATE HEALTH INSURANCE | Attending: Emergency Medicine

## 2019-02-22 DIAGNOSIS — M545 Low back pain: Secondary | ICD-10-CM

## 2019-02-22 LAB — LIPASE: Lipase: 36 U/L (ref 13–60)

## 2019-02-22 LAB — CBC WITH AUTO DIFFERENTIAL
Absolute Eos #: 0.1 10*3/uL (ref 0.00–0.44)
Absolute Immature Granulocyte: 0.09 10*3/uL (ref 0.00–0.30)
Absolute Lymph #: 2.19 10*3/uL (ref 1.10–3.70)
Absolute Mono #: 0.54 10*3/uL (ref 0.10–1.20)
Basophils Absolute: 0.05 10*3/uL (ref 0.00–0.20)
Basophils: 1 % (ref 0–2)
Eosinophils %: 1 % (ref 1–4)
Hematocrit: 38.2 % (ref 36.3–47.1)
Hemoglobin: 12.7 g/dL (ref 11.9–15.1)
Immature Granulocytes: 1 % — ABNORMAL HIGH
Lymphocytes: 22 % — ABNORMAL LOW (ref 24–43)
MCH: 30.5 pg (ref 25.2–33.5)
MCHC: 33.2 g/dL (ref 28.4–34.8)
MCV: 91.6 fL (ref 82.6–102.9)
MPV: 9.5 fL (ref 8.1–13.5)
Monocytes: 6 % (ref 3–12)
NRBC Automated: 0 per 100 WBC
Platelets: 348 10*3/uL (ref 138–453)
RBC: 4.17 m/uL (ref 3.95–5.11)
RDW: 11.9 % (ref 11.8–14.4)
Seg Neutrophils: 69 % — ABNORMAL HIGH (ref 36–65)
Segs Absolute: 6.83 10*3/uL (ref 1.50–8.10)
WBC: 9.8 10*3/uL (ref 3.5–11.3)

## 2019-02-22 LAB — BASIC METABOLIC PANEL
Anion Gap: 9 mmol/L (ref 9–17)
BUN: 22 mg/dL — ABNORMAL HIGH (ref 6–20)
Bun/Cre Ratio: 29 — ABNORMAL HIGH (ref 9–20)
CO2: 25 mmol/L (ref 20–31)
Calcium: 9.4 mg/dL (ref 8.6–10.4)
Chloride: 97 mmol/L — ABNORMAL LOW (ref 98–107)
Creatinine: 0.77 mg/dL (ref 0.50–0.90)
GFR African American: >60 mL/min (ref >60)
GFR Non-African American: >60 mL/min (ref >60)
Glucose: 355 mg/dL — ABNORMAL HIGH (ref 70–99)
Potassium: 4.1 mmol/L (ref 3.7–5.3)
Sodium: 131 mmol/L — ABNORMAL LOW (ref 135–144)

## 2019-02-22 LAB — URINALYSIS
Bilirubin Urine: NEGATIVE
Ketones, Urine: NEGATIVE
Leukocyte Esterase, Urine: NEGATIVE
Nitrite, Urine: NEGATIVE
Protein, UA: NEGATIVE
Specific Gravity, UA: 1.025 (ref 1.005–1.030)
Urine Hgb: NEGATIVE
Urobilinogen, Urine: NORMAL
pH, UA: 5.5 (ref 5.0–8.0)

## 2019-02-22 LAB — POCT URINE PREGNANCY: Preg Test, Ur: NEGATIVE

## 2019-02-22 LAB — HEPATIC FUNCTION PANEL
ALT: 19 U/L (ref 5–33)
AST: 11 U/L (ref <32)
Albumin: 3.9 g/dL (ref 3.5–5.2)
Alkaline Phosphatase: 127 U/L — ABNORMAL HIGH (ref 35–104)
Bilirubin, Direct: <0.08 mg/dL (ref <0.31)
Total Bilirubin: 0.23 mg/dL — ABNORMAL LOW (ref 0.3–1.2)
Total Protein: 7.2 g/dL (ref 6.4–8.3)

## 2019-02-22 MED ORDER — HYDROCODONE-ACETAMINOPHEN 5-325 MG PO TABS
5-325 MG | Freq: Once | ORAL | Status: AC
Start: 2019-02-22 — End: 2019-02-22
  Administered 2019-02-22: 18:00:00 1 via ORAL

## 2019-02-22 MED ORDER — DICYCLOMINE HCL 10 MG PO CAPS
10 MG | ORAL_CAPSULE | Freq: Four times a day (QID) | ORAL | 0 refills | Status: DC | PRN
Start: 2019-02-22 — End: 2019-12-10

## 2019-02-22 MED ORDER — MORPHINE SULFATE (PF) 2 MG/ML IV SOLN
2 MG/ML | Freq: Once | INTRAVENOUS | Status: DC
Start: 2019-02-22 — End: 2019-02-22

## 2019-02-22 MED ORDER — METHOCARBAMOL 500 MG PO TABS
500 MG | ORAL_TABLET | Freq: Three times a day (TID) | ORAL | 0 refills | Status: AC | PRN
Start: 2019-02-22 — End: 2019-03-04

## 2019-02-22 MED FILL — HYDROCODONE-ACETAMINOPHEN 5-325 MG PO TABS: 5-325 mg | ORAL | Qty: 1

## 2019-02-22 MED FILL — MORPHINE SULFATE 2 MG/ML IJ SOLN: 2 MG/ML | INTRAMUSCULAR | Qty: 1

## 2019-02-22 NOTE — ED Triage Notes (Signed)
Pt states x 2 day lower abdominal right quadrant pain along with mid low back pain, urinary frequency.  Pt is a diabetic, sugars running in the 200's.

## 2019-02-22 NOTE — ED Notes (Signed)
Stacey Paramedic to bedside for IV insertion.       Candie Mile, RN  02/22/19 1153

## 2019-02-22 NOTE — ED Notes (Signed)
POC dipped results on chart.       Candie Mile, RN  02/22/19 1135

## 2019-02-22 NOTE — ED Provider Notes (Signed)
[facility]  EMERGENCY DEPARTMENT ENCOUNTER      Pt Name: Carolyn MooreKara M Crane  MRN: [id number]  Birthdate 08/14/1975  Date of evaluation: 02/22/19  CHIEF COMPLAINT       Chief Complaint   Patient presents with   ??? Back Pain   ??? Abdominal Pain     Right lower x 2 days   ??? Urinary Frequency     x 2 days      HISTORY OF PRESENT ILLNESS   [place] to the emergency room via private auto with abdominal pain, back pain and urinary frequency that started 2 days ago.  She complains of pain to the bilateral low back and to the right lower quadrant of the abdomen.  Pain is constant without aggravating or alleviating factors.  She is having urinary frequency and urgency but no dysuria or difficulty urinating.  She denies generalized body aches or fevers.  Tylenol and Motrin have been ineffective.    The history is provided by the patient.       REVIEW OF SYSTEMS     Review of Systems   Constitutional: Negative for activity change and fever.   HENT: Negative for sinus pressure.    Respiratory: Negative for cough and shortness of breath.    Cardiovascular: Negative for chest pain and palpitations.   Gastrointestinal: Positive for abdominal pain. Negative for constipation, diarrhea, nausea and vomiting.   Genitourinary: Positive for frequency and urgency. Negative for difficulty urinating, dysuria and flank pain.   Musculoskeletal: Positive for back pain. Negative for myalgias.   Skin: Negative for color change and rash.   Neurological: Negative for dizziness and headaches.   Psychiatric/Behavioral: Negative for confusion and decreased concentration.     PASTMEDICAL HISTORY     Past Medical History:   Diagnosis Date   ??? Bipolar 1 disorder ([facility])    ??? Depression    ??? Diabetes mellitus ([facility])    ??? Fibromyalgia    ??? Hyperlipidemia    ??? IBS (irritable bowel syndrome)    ??? PCOS (polycystic ovarian syndrome)      SURGICAL HISTORY       Past Surgical History:   Procedure Laterality Date   ??? APPENDECTOMY     ??? CHOLECYSTECTOMY     ??? FOOT  SURGERY Left     plantar fascitiis release   ??? SHOULDER SURGERY Left    ??? TONSILLECTOMY       CURRENT MEDICATIONS       Previous Medications    ARIPIPRAZOLE (ABILIFY) 15 MG TABLET    Take 30 mg by mouth daily     ATORVASTATIN (LIPITOR) 80 MG TABLET    Take 80 mg by mouth daily    ESCITALOPRAM (LEXAPRO) 10 MG TABLET    Take 10 mg by mouth daily     FENOFIBRATE 160 MG TABLET    Take 160 mg by mouth daily     INSULIN DEGLUDEC (TRESIBA FLEXTOUCH) 200 UNIT/ML SOPN    Inject 100 Units into the skin every morning     LANSOPRAZOLE (PREVACID) 30 MG DELAYED RELEASE CAPSULE    Take 30 mg by mouth daily    LIRAGLUTIDE (VICTOZA SC)    Inject 1.8 Units into the skin daily     VALACYCLOVIR (VALTREX) 500 MG TABLET    Take 1,000 mg by mouth 2 times daily as needed      ALLERGIES     is allergic to prochlorperazine; reglan [metoclopramide]; and zofran [ondansetron hcl].  FAMILY HISTORY     has no family status information on file.      SOCIAL HISTORY       Social History     Tobacco Use   ??? Smoking status: Never Smoker   ??? Smokeless tobacco: Never Used   Substance Use Topics   ??? Alcohol use: Yes     Comment: socially   ??? Drug use: No     PHYSICAL EXAM     INITIAL VITALS: BP 117/83    Pulse 106    Temp 98.6 ??F (37 ??C)    Resp 18    Ht 5\' 5"  (1.651 m)    Wt 205 lb (93 kg)    LMP 01/14/2019    SpO2 97%    BMI 34.11 kg/m??    Physical Exam  Constitutional:       Appearance: Normal appearance.   HENT:      Right Ear: External ear normal.      Left Ear: External ear normal.   Eyes:      General: No scleral icterus.        Right eye: No discharge.         Left eye: No discharge.      Conjunctiva/sclera: Conjunctivae normal.   Cardiovascular:      Rate and Rhythm: Regular rhythm. Tachycardia present.   Pulmonary:      Effort: Pulmonary effort is normal.      Breath sounds: Normal breath sounds.   Abdominal:      General: Bowel sounds are normal.      Palpations: Abdomen is soft. There is no mass.      Tenderness: There is no abdominal  tenderness. There is no right CVA tenderness, left CVA tenderness or guarding.   Musculoskeletal: Normal range of motion.      Right lower leg: No edema.      Left lower leg: No edema.   Skin:     General: Skin is warm and dry.   Neurological:      General: No focal deficit present.      Mental Status: She is alert and oriented to person, place, and time.   Psychiatric:         Mood and Affect: Mood normal.         Thought Content: Thought content normal.         DIAGNOSTIC RESULTS     RADIOLOGY:All plain film, CT, MRI, and formal ultrasound images (except ED bedside ultrasound) are read by the radiologist, see reports below, unless otherwise noted in MDM or here.    Interpretation per the Radiologist below, if available at the time of this note:    No orders to display       LABS:  Labs Reviewed   CBC WITH AUTO DIFFERENTIAL - Abnormal; Notable for the following components:       Result Value    Seg Neutrophils 69 (*)     Lymphocytes 22 (*)     Immature Granulocytes 1 (*)     All other components within normal limits   BASIC METABOLIC PANEL - Abnormal; Notable for the following components:    Glucose 355 (*)     BUN 22 (*)     Bun/Cre Ratio 29 (*)     Sodium 131 (*)     Chloride 97 (*)     All other components within normal limits   HEPATIC FUNCTION PANEL - Abnormal; Notable for the following components:  Alkaline Phosphatase 127 (*)     Total Bilirubin 0.23 (*)     All other components within normal limits   POCT URINE PREGNANCY - Normal   CULTURE, URINE   LIPASE   URINALYSIS       All other labs were within normal range or not returned as of this dictation.    EMERGENCY DEPARTMENT COURSE and DIFFERENTIAL DIAGNOSIS/MDM:   Vitals:    Vitals:    02/22/19 1124 02/22/19 1125   BP: 117/83    Pulse: 106    Resp: 18    Temp: 98.6 ??F (37 ??C)    SpO2: 97%    Weight:  205 lb (93 kg)   Height:  5\' 5"  (1.651 m)       Medical Decision Making: 43 year old female who is afebrile does not appear ill.  No distress.  Abdominal  exam is benign.  Do not have any clinical concern for surgical abdomen.  Urinedip does not have any evidence of infection and has no blood.  Blood work is unremarkable.  Additional emergent work-up is not indicated.  Urine culture is pending.  She treated symptomatically and will follow up with her PCP.             The patient was given the following meds in the ED:  Orders Placed This Encounter   Medications   ??? morphine (PF) injection 2 mg   ??? HYDROcodone-acetaminophen (NORCO) 5-325 MG per tablet 1 tablet   ??? methocarbamol (ROBAXIN) 500 MG tablet     Sig: Take 1 tablet by mouth 3 times daily as needed (pain, spasms)     Dispense:  40 tablet     Refill:  0   ??? dicyclomine (BENTYL) 10 MG capsule     Sig: Take 1 capsule by mouth every 6 hours as needed (cramps)     Dispense:  20 capsule     Refill:  0       FINAL IMPRESSION      1. Right lower quadrant abdominal pain    2. Acute bilateral low back pain without sciatica    3. Urinary frequency          DISPOSITION/PLAN   DISPOSITION Decision To Discharge 02/22/2019 01:32:17 PM      PATIENT REFERRED TO:   02/24/2019, APRN - CNP  [address]  [place]  [phone]    Schedule an appointment as soon as possible for a visit         DISCHARGE MEDICATIONS:     New Prescriptions    DICYCLOMINE (BENTYL) 10 MG CAPSULE    Take 1 capsule by mouth every 6 hours as needed (cramps)    METHOCARBAMOL (ROBAXIN) 500 MG TABLET    Take 1 tablet by mouth 3 times daily as needed (pain, spasms)       CRITICAL CARE TIME       Please note that portions of this note were completed with a voice recognition program.      Kishana Battey A Bijon Mineer, APRN - CNP           [phone], APRN - CNP  02/22/19 1335

## 2019-02-22 NOTE — ED Provider Notes (Signed)
The patient was seen and examined by me in conjunction with the mid-level provider.  I agree with his/her assessment and treatment plan.    Work-up including urinalysis is negative.  Culture ordered.     Cherie Ouch, MD  02/22/19 1329

## 2019-02-23 LAB — CULTURE, URINE

## 2019-02-23 LAB — POCT URINE PREGNANCY: HCG, Pregnancy Urine (POC): NEGATIVE

## 2019-02-23 NOTE — Telephone Encounter (Signed)
Received new referral for Diabetes Medication Management from [facility].  Called patient and patient denied service. States her diabetes is managed by her [facility] provider.

## 2019-03-15 ENCOUNTER — Emergency Department: Admit: 2019-03-15 | Payer: PRIVATE HEALTH INSURANCE | Primary: Nurse Practitioner

## 2019-03-15 ENCOUNTER — Inpatient Hospital Stay
Admit: 2019-03-15 | Discharge: 2019-03-15 | Disposition: A | Discharge status: Home / Self Care | Payer: PRIVATE HEALTH INSURANCE | Attending: Emergency Medicine

## 2019-03-15 DIAGNOSIS — B349 Viral infection, unspecified: Secondary | ICD-10-CM

## 2019-03-15 LAB — RAPID INFLUENZA A/B ANTIGENS

## 2019-03-15 MED ORDER — BENZONATATE 100 MG PO CAPS
100 MG | ORAL_CAPSULE | Freq: Three times a day (TID) | ORAL | 0 refills | Status: AC | PRN
Start: 2019-03-15 — End: 2019-03-22

## 2019-03-15 MED ORDER — ALBUTEROL SULFATE HFA 108 (90 BASE) MCG/ACT IN AERS
108 (90 Base) MCG/ACT | RESPIRATORY_TRACT | 0 refills | Status: DC | PRN
Start: 2019-03-15 — End: 2019-08-09

## 2019-03-15 MED ORDER — NAPROXEN 500 MG PO TABS
500 MG | ORAL_TABLET | Freq: Two times a day (BID) | ORAL | 0 refills | Status: DC
Start: 2019-03-15 — End: 2019-08-09

## 2019-03-15 NOTE — ED Provider Notes (Signed)
The patient was seen and examined by me in conjunction with the mid-level provider.  I agree with his/her assessment and treatment plan.    Chest x-ray and flu swab are negative.  Coronavirus test pending.  Findings were discussed with the patient.     Cherie Ouch, MD  03/15/19 [phone]

## 2019-03-15 NOTE — Discharge Instructions (Signed)
Take meds as prescribed.  Follow up with doctor  in 3 -4 days.  Return to ER immediately if symptoms worsen or persist.

## 2019-03-15 NOTE — ED Provider Notes (Signed)
[facility] ED  eMERGENCY dEPARTMENTeNCOUnter      Pt Name: Carolyn Crane  MRN: [id number]  Birthdate 1976/03/31  Date ofevaluation: 03/15/2019  Provider: Raoul Pitch, PA-C    CHIEF COMPLAINT       Chief Complaint   Patient presents with   ??? Fever     100.4   ??? Headache   ??? Generalized Body Aches         HISTORY OF PRESENT ILLNESS  (Location/Symptom, Timing/Onset, Context/Setting, Quality, Duration, Modifying Factors, Severity.)   Carolyn Crane is a 43 y.o. female who presents to the emergency department with headache generalized body aches and reported fevers at home 100.4.  No fever here in the ER.  No nausea vomiting.  No chest pain.  Questionable cough.  Questionable intermittent contact with people who have been positive COVID-19.  No nausea or vomiting.  No definitely been aggravating factors.  No other complaints.      Nursing Notes were reviewed.    ALLERGIES     Prochlorperazine; Reglan [metoclopramide]; and Zofran [ondansetron hcl]    CURRENT MEDICATIONS       Discharge Medication List as of 03/15/2019 12:37 PM      CONTINUE these medications which have NOT CHANGED    Details   dicyclomine (BENTYL) 10 MG capsule Take 1 capsule by mouth every 6 hours as needed (cramps), Disp-20 capsule,R-0Print      lansoprazole (PREVACID) 30 MG delayed release capsule Take 30 mg by mouth dailyHistorical Med      escitalopram (LEXAPRO) 10 MG tablet Take 10 mg by mouth daily Historical Med      Liraglutide (VICTOZA SC) Inject 1.8 Units into the skin daily Historical Med      atorvastatin (LIPITOR) 80 MG tablet Take 80 mg by mouth dailyHistorical Med      valACYclovir (VALTREX) 500 MG tablet Take 1,000 mg by mouth 2 times daily as needed Historical Med      fenofibrate 160 MG tablet Take 160 mg by mouth daily Historical Med      Insulin Degludec (TRESIBA FLEXTOUCH) 200 UNIT/ML SOPN Inject 100 Units into the skin every morning Historical Med      ARIPiprazole (ABILIFY) 15 MG tablet Take 30 mg by mouth daily  Historical Med             PAST MEDICAL HISTORY         Diagnosis Date   ??? Bipolar 1 disorder (HCC)    ??? Depression    ??? Diabetes mellitus (HCC)    ??? Fibromyalgia    ??? Hyperlipidemia    ??? IBS (irritable bowel syndrome)    ??? PCOS (polycystic ovarian syndrome)        SURGICAL HISTORY           Procedure Laterality Date   ??? APPENDECTOMY     ??? CHOLECYSTECTOMY     ??? FOOT SURGERY Left     plantar fascitiis release   ??? SHOULDER SURGERY Left    ??? TONSILLECTOMY           FAMILY HISTORY     History reviewed. No pertinent family history.  No family status information on file.        SOCIAL HISTORY      reports that she has never smoked. She has never used smokeless tobacco. She reports current alcohol use. She reports that she does not use drugs.    REVIEW OFSYSTEMS    (2-9 systems for level  4, 10 or more for level 5)   Review of Systems    Except as noted above the remainder of the review of systems was reviewed and negative.     PHYSICAL EXAM    (up to 7 for level 4, 8 or more for level 5)     ED Triage Vitals   BP Temp Temp Source Pulse Resp SpO2 Height Weight   03/15/19 1044 03/15/19 1044 03/15/19 1044 03/15/19 1044 03/15/19 1044 03/15/19 1044 03/15/19 1043 03/15/19 1043   (!) 143/88 98.4 ??F (36.9 ??C) Oral 80 16 98 % 5\' 5"  (1.651 m) 204 lb (92.5 kg)      Physical Exam  Constitutional:       Appearance: She is well-developed.   HENT:      Head: Normocephalic and atraumatic.   Neck:      Musculoskeletal: Normal range of motion and neck supple.   Cardiovascular:      Rate and Rhythm: Normal rate and regular rhythm.   Pulmonary:      Effort: Pulmonary effort is normal.      Breath sounds: Normal breath sounds.   Abdominal:      General: There is no distension.      Palpations: Abdomen is soft.      Tenderness: There is no abdominal tenderness.   Musculoskeletal: Normal range of motion.   Skin:     General: Skin is warm.      Findings: No rash.   Neurological:      Mental Status: She is alert and oriented to person, place, and  time.   Psychiatric:         Behavior: Behavior normal.                 DIAGNOSTIC RESULTS     EKG: All EKG's are interpreted by the Emergency Department Physician who either signs or Co-signs this chart in the absence of a cardiologist.        RADIOLOGY:   Non-plain film images such as CT, Ultrasound and MRI are read by the radiologist. Plain radiographic images arevisualized and preliminarily interpreted by the emergency physician with the below findings:        Interpretation per the Radiologist below, if available at thetime of this note:          ED BEDSIDE ULTRASOUND:   Performed by ED Physician - none    LABS:  Labs Reviewed   RAPID INFLUENZA A/B ANTIGENS   COVID-19       All other labs were within normal range or not returned as of this dictation.    EMERGENCY DEPARTMENT COURSE and DIFFERENTIAL DIAGNOSIS/MDM:   Vitals:    Vitals:    03/15/19 1043 03/15/19 1044 03/15/19 1207   BP:  (!) 143/88    Pulse:  80    Resp:  16    Temp:  98.4 ??F (36.9 ??C) 97.3 ??F (36.3 ??C)   TempSrc:  Oral Oral   SpO2:  98%    Weight: 204 lb (92.5 kg)     Height: 5\' 5"  (1.651 m)       Flu swab negative.  COVID-19 swab pending.  X-ray chest is negative.  Patient does not appear in any type of distress.  Will discharge home.    The patient was evaluated during the global COVID-19 pandemic, and that diagnosis was suspected/considered upon their initial presentation. Their evaluation, treatment and testing was consistent with current guidelines for patients who present with complaints  or symptoms that may be related to COVID-19.      CONSULTS:  None    PROCEDURES:  Procedures        FINAL IMPRESSION      1. Suspected COVID-19 virus infection    2. Viral illness          DISPOSITION/PLAN   DISPOSITION Decision To Discharge 03/15/2019 12:35:54 PM      PATIENTREFERRED TO:   Mack Hook, APRN - CNP  [address], [address] [place] [address]  [phone]    In 3 days        DISCHARGE MEDICATIONS:     Discharge Medication List as of  03/15/2019 12:37 PM      START taking these medications    Details   naproxen (NAPROSYN) 500 MG tablet Take 1 tablet by mouth 2 times daily (with meals), Disp-20 tablet,R-0Print      benzonatate (TESSALON PERLES) 100 MG capsule Take 1 capsule by mouth 3 times daily as needed for Cough, Disp-30 capsule,R-0Print      albuterol sulfate HFA (PROAIR HFA) 108 (90 Base) MCG/ACT inhaler Inhale 2 puffs into the lungs every 4 hours as needed for Wheezing or Shortness of Breath, Disp-1 Inhaler,R-0Print                 (Please note that portions of this note were completed with a voice recognition program.  Efforts were made to edit thedictations but occasionally words are mis-transcribed.)    Bethanie Dicker, PA-C            Bethanie Dicker, PA-C  03/15/19 1254

## 2019-03-16 LAB — COVID-19: SARS-CoV-2: NOT DETECTED

## 2019-03-16 NOTE — Care Coordination-Inpatient (Signed)
Patient contacted regarding COVID-19 exposure. Discussed COVID-19 related testing which was pending at this time. Test results were pending. Patient informed of results, if available? N/A    Care Transition Nurse/ Ambulatory Care Manager contacted the patient by telephone to perform post discharge assessment. Call within 2 business days of discharge: Yes. Verified name and DOB with patient as identifiers. Provided introduction to self, and explanation of the CTN/ACM role, and reason for call due to risk factors for infection and/or exposure to COVID-19.     Symptoms reviewed with patient who verbalized the following symptoms: Denies any symptoms.      Due to no new or worsening symptoms encounter was not routed to provider for escalation. Discussed follow-up appointments. If no appointment was previously scheduled, appointment scheduling offered: No  [place] follow up appointment(s): No future appointments.  Non-[facility] follow up appointment(s):      Non-face-to-face services provided:        Advance Care Planning:   Does patient have an Advance Directive:  Did not have an opportunity to review. Patient was in a hurry.  .     Patient has following risk factors of: diabetes. CTN/ACM reviewed discharge instructions, medical action plan and red flags such as increased shortness of breath, increasing fever and signs of decompensation with patient who verbalized understanding.   Discussed exposure protocols and quarantine with CDC Guidelines ???What to do if you are sick with coronavirus disease 2019.??? Patient was given an opportunity for questions and concerns. The patient agrees to contact the [facility] exposure line [phone], local health department Did not have an opportunity to discuss, and PCP office for questions related to their healthcare. CTN/ACM provided contact information for future needs.    Reviewed and educated patient on any new and changed medications related to discharge diagnosis      Patient/family/caregiver given information for [facility] Loop and agrees to enroll no  Patient's preferred e-mail: N/A   Patient's preferred phone number: N/A  Based on Loop alert triggers, patient will be contacted by nurse care manager for worsening symptoms.    PATIENT DECLINED ANY FURTHER FOLLOW UP.

## 2019-04-06 NOTE — Telephone Encounter (Addendum)
Encounter closed - patient uses PCP to manage diabetes - referral already closed (02/23/19)

## 2019-06-27 ENCOUNTER — Emergency Department: Admit: 2019-06-27 | Payer: PRIVATE HEALTH INSURANCE | Primary: Nurse Practitioner

## 2019-06-27 ENCOUNTER — Inpatient Hospital Stay
Admit: 2019-06-27 | Discharge: 2019-06-27 | Disposition: A | Discharge status: Home / Self Care | Payer: PRIVATE HEALTH INSURANCE | Attending: Emergency Medicine

## 2019-06-27 DIAGNOSIS — R0789 Other chest pain: Secondary | ICD-10-CM

## 2019-06-27 LAB — CBC WITH AUTO DIFFERENTIAL
Absolute Eos #: 0.09 10*3/uL (ref 0.00–0.44)
Absolute Immature Granulocyte: 0.04 10*3/uL (ref 0.00–0.30)
Absolute Lymph #: 1.97 10*3/uL (ref 1.10–3.70)
Absolute Mono #: 0.49 10*3/uL (ref 0.10–1.20)
Basophils Absolute: 0.05 10*3/uL (ref 0.00–0.20)
Basophils: 1 % (ref 0–2)
Eosinophils %: 1 % (ref 1–4)
Hematocrit: 41 % (ref 36.3–47.1)
Hemoglobin: 13.3 g/dL (ref 11.9–15.1)
Immature Granulocytes: 0 %
Lymphocytes: 22 % — ABNORMAL LOW (ref 24–43)
MCH: 30 pg (ref 25.2–33.5)
MCHC: 32.4 g/dL (ref 28.4–34.8)
MCV: 92.6 fL (ref 82.6–102.9)
MPV: 9.4 fL (ref 8.1–13.5)
Monocytes: 5 % (ref 3–12)
NRBC Automated: 0 per 100 WBC
Platelets: 358 10*3/uL (ref 138–453)
RBC: 4.43 m/uL (ref 3.95–5.11)
RDW: 12.6 % (ref 11.8–14.4)
Seg Neutrophils: 71 % — ABNORMAL HIGH (ref 36–65)
Segs Absolute: 6.37 10*3/uL (ref 1.50–8.10)
WBC: 9 10*3/uL (ref 3.5–11.3)

## 2019-06-27 LAB — TROP/MYOGLOBIN
Myoglobin: <21 ng/mL — ABNORMAL LOW (ref 25–58)
Myoglobin: <21 ng/mL — ABNORMAL LOW (ref 25–58)
Troponin, High Sensitivity: 7 ng/L (ref 0–14)
Troponin, High Sensitivity: 6 ng/L (ref 0–14)

## 2019-06-27 LAB — BASIC METABOLIC PANEL
Anion Gap: 14 mmol/L (ref 9–17)
BUN: 18 mg/dL (ref 6–20)
Bun/Cre Ratio: 26 — ABNORMAL HIGH (ref 9–20)
CO2: 24 mmol/L (ref 20–31)
Calcium: 9.4 mg/dL (ref 8.6–10.4)
Chloride: 97 mmol/L — ABNORMAL LOW (ref 98–107)
Creatinine: 0.69 mg/dL (ref 0.50–0.90)
GFR African American: >60 mL/min (ref >60)
GFR Non-African American: >60 mL/min (ref >60)
Glucose: 298 mg/dL — ABNORMAL HIGH (ref 70–99)
Potassium: 4.2 mmol/L (ref 3.7–5.3)
Sodium: 135 mmol/L (ref 135–144)

## 2019-06-27 LAB — D-DIMER, QUANTITATIVE: D-Dimer, Quant: 0.29 mg/L FEU (ref 0.00–0.59)

## 2019-06-27 MED ORDER — FAMOTIDINE 20 MG/2ML IV SOLN
202 MG/2ML | Freq: Once | INTRAVENOUS | Status: AC
Start: 2019-06-27 — End: 2019-06-27
  Administered 2019-06-27: 17:00:00 20 mg via INTRAVENOUS

## 2019-06-27 MED ORDER — ASPIRIN 81 MG PO CHEW
81 MG | Freq: Once | ORAL | Status: AC
Start: 2019-06-27 — End: 2019-06-27
  Administered 2019-06-27: 17:00:00 324 mg via ORAL

## 2019-06-27 MED FILL — FAMOTIDINE 20 MG/2ML IV SOLN: 20 MG/2ML | INTRAVENOUS | Qty: 2

## 2019-06-27 MED FILL — ASPIRIN LOW STRENGTH 81 MG PO CHEW: 81 mg | ORAL | Qty: 4

## 2019-06-27 NOTE — Discharge Instructions (Signed)
Follow up with doctor  in morning and ask for re-evaluation and further cardiac evalution. Return to ER immediately if symptoms worsen or persist.

## 2019-06-27 NOTE — ED Provider Notes (Signed)
[place] [facility] ED  EMERGENCY DEPARTMENT ENCOUNTER   ATTENDING ATTESTATION     Pt Name: Carolyn Crane  MRN: [id number]  Birthdate [birth year]  Date of evaluation: 06/27/19       INDIYAH PAONE is a 44 y.o. female who presents with Chest Pain      MDM:     Patient's EKG shows sinus rhythm rate of 91, PR QRS QTC and was unremarkable and the patient has normal axis no ST elevations or depressions, no significant T wave changes.  Nonspecific EKG.    Vitals:   Vitals:    06/27/19 1121   BP: 135/86   Pulse: 100   Resp: 18   Temp: 98 ??F (36.7 ??C)   TempSrc: Oral   SpO2: 98%   Weight: 211 lb (95.7 kg)   Height: 5\' 5"  (1.651 m)         I personally evaluated and examined the patient in conjunction with the Midlevel provider and agree with the assessment, treatment plan, and disposition of the patient as recorded by the midlevel.    I performed a history and physical examination of the patient and discussed management with the midlevel. I reviewed the midlevel???s note and agree with the documented findings and plan of care. Any areas of disagreement are noted on the chart. I was personally present for the key portions of any procedures. I have documented in the chart those procedures where I was not present during the key portions. I have personally reviewed all images and agree with the midlevel's interpretation. I have reviewed the emergency nurses triage note. I agree with the chief complaint, past medical history, past surgical history, allergies, medications, social and family history as documented unless otherwise noted.    , MD  Attending Emergency  Physician                 Jettie Pagan, MD  06/27/19 [phone]

## 2019-06-27 NOTE — ED Provider Notes (Signed)
[place] ED  eMERGENCY dEPARTMENTeNCOUnter      Pt Name: Carolyn Crane  MRN: [id number]  [place] [birth year]  Date ofevaluation: 06/27/2019  Provider: Bethanie Dicker, PA-C    CHIEF COMPLAINT       Chief Complaint   Patient presents with   ??? Chest Pain         HISTORY OF PRESENT ILLNESS  (Location/Symptom, Timing/Onset, Context/Setting, Quality, Duration, Modifying Factors, Severity.)   Carolyn Crane is a 44 y.o. female who presents to the emergency department with pain.  Chest pain started a couple hours prior to arrival.  First she noted some diarrhea and she noticed some pain in her right shoulder and right jaw and neck area.  Nausea also was present.  Reports 2 episodes of diarrhea today.  Reports some chest tightness in the ER as well.  For family history of cardiac disease.  She is a diabetic.  Denies any fevers or chills.  No exertional symptoms.  No definite alleviating aggravating factors.      Nursing Notes were reviewed.    ALLERGIES     Prochlorperazine, Reglan [metoclopramide], and Zofran [ondansetron hcl]    CURRENT MEDICATIONS       Discharge Medication List as of 06/27/2019  2:53 PM      CONTINUE these medications which have NOT CHANGED    Details   naproxen (NAPROSYN) 500 MG tablet Take 1 tablet by mouth 2 times daily (with meals), Disp-20 tablet,R-0Print      albuterol sulfate HFA (PROAIR HFA) 108 (90 Base) MCG/ACT inhaler Inhale 2 puffs into the lungs every 4 hours as needed for Wheezing or Shortness of Breath, Disp-1 Inhaler,R-0Print      dicyclomine (BENTYL) 10 MG capsule Take 1 capsule by mouth every 6 hours as needed (cramps), Disp-20 capsule,R-0Print      lansoprazole (PREVACID) 30 MG delayed release capsule Take 30 mg by mouth dailyHistorical Med      escitalopram (LEXAPRO) 10 MG tablet Take 10 mg by mouth daily Historical Med      Liraglutide (VICTOZA SC) Inject 1.8 Units into the skin daily Historical Med      atorvastatin (LIPITOR) 80 MG tablet Take 80 mg by mouth  dailyHistorical Med      valACYclovir (VALTREX) 500 MG tablet Take 1,000 mg by mouth 2 times daily as needed Historical Med      fenofibrate 160 MG tablet Take 160 mg by mouth daily Historical Med      Insulin Degludec (TRESIBA FLEXTOUCH) 200 UNIT/ML SOPN Inject 100 Units into the skin every morning Historical Med      ARIPiprazole (ABILIFY) 15 MG tablet Take 30 mg by mouth daily Historical Med             PAST MEDICAL HISTORY         Diagnosis Date   ??? Bipolar 1 disorder ([place])    ??? Depression    ??? Diabetes mellitus ([place])    ??? Fibromyalgia    ??? Hyperlipidemia    ??? IBS (irritable bowel syndrome)    ??? PCOS (polycystic ovarian syndrome)        SURGICAL HISTORY           Procedure Laterality Date   ??? APPENDECTOMY     ??? CHOLECYSTECTOMY     ??? FOOT SURGERY Left     plantar fascitiis release   ??? SHOULDER SURGERY Left    ??? TONSILLECTOMY  FAMILY HISTORY     No family history on file.  No family status information on file.        SOCIAL HISTORY      reports that she has never smoked. She has never used smokeless tobacco. She reports current alcohol use. She reports that she does not use drugs.    REVIEW OFSYSTEMS    (2-9 systems for level 4, 10 or more for level 5)   Review of Systems    Except as noted above the remainder of the review of systems was reviewed and negative.     PHYSICAL EXAM    (up to 7 for level 4, 8 or more for level 5)     ED Triage Vitals [06/27/19 1121]   BP Temp Temp Source Pulse Resp SpO2 Height Weight   135/86 98 ??F (36.7 ??C) Oral 100 18 98 % 5\' 5"  (1.651 m) 211 lb (95.7 kg)      Physical Exam  Constitutional:       Appearance: She is well-developed.   HENT:      Head: Normocephalic and atraumatic.   Neck:      Musculoskeletal: Normal range of motion and neck supple.   Cardiovascular:      Rate and Rhythm: Normal rate and regular rhythm.   Pulmonary:      Effort: Pulmonary effort is normal.      Breath sounds: Normal breath sounds.   Abdominal:      General: There is no distension.       Palpations: Abdomen is soft.      Tenderness: There is no abdominal tenderness.   Musculoskeletal: Normal range of motion.   Skin:     General: Skin is warm.      Findings: No rash.   Neurological:      Mental Status: She is alert and oriented to person, place, and time.   Psychiatric:         Behavior: Behavior normal.                 DIAGNOSTIC RESULTS     EKG: All EKG's are interpreted by the Emergency Department Physician who either signs or Co-signs this chart in the absence of a cardiologist.    Time 1122.  91 bpm.  No ST segment elevation. nsr    RADIOLOGY:   Non-plain film images such as CT, Ultrasound and MRI are read by the radiologist. Plain radiographic images arevisualized and preliminarily interpreted by the emergency physician with the below findings:        Interpretation per the Radiologist below, if available at thetime of this note:          ED BEDSIDE ULTRASOUND:   Performed by ED Physician - none    LABS:  Labs Reviewed   CBC WITH AUTO DIFFERENTIAL - Abnormal; Notable for the following components:       Result Value    Seg Neutrophils 71 (*)     Lymphocytes 22 (*)     All other components within normal limits   BASIC METABOLIC PANEL - Abnormal; Notable for the following components:    Glucose 298 (*)     Bun/Cre Ratio 26 (*)     Chloride 97 (*)     All other components within normal limits   TROP/MYOGLOBIN - Abnormal; Notable for the following components:    Myoglobin <21 (*)     All other components within normal limits   TROP/MYOGLOBIN - Abnormal; Notable  for the following components:    Myoglobin <21 (*)     All other components within normal limits   D-DIMER, QUANTITATIVE   TROP/MYOGLOBIN       All other labs were within normal range or not returned as of this dictation.    EMERGENCY DEPARTMENT COURSE and DIFFERENTIAL DIAGNOSIS/MDM:   Vitals:    Vitals:    06/27/19 1121   BP: 135/86   Pulse: 100   Resp: 18   Temp: 98 ??F (36.7 ??C)   TempSrc: Oral   SpO2: 98%   Weight: 211 lb (95.7 kg)    Height: 5\' 5"  (1.651 m)     Patient's work-up is negative in the ER with 2 - cardiac enzymes and a negative D-dimer.  Patient was offered admission for further cardiac evaluation which she has declined.  Risk benefits were discussed.    CONSULTS:  None    PROCEDURES:  Procedures        FINAL IMPRESSION      1. Chest pain, unspecified type          DISPOSITION/PLAN   DISPOSITION Decision To Discharge 06/27/2019 02:51:31 PM      PATIENTREFERRED TO:   08/27/2019, APRN - CNP  [address], [address]  [place]  [phone]    In 1 day        DISCHARGE MEDICATIONS:     Discharge Medication List as of 06/27/2019  2:53 PM              (Please note that portions of this note were completed with a voice recognition program.  Efforts were made to edit thedictations but occasionally words are mis-transcribed.)    08/27/2019, PA-C            Raoul Pitch, PA-C  06/27/19 [phone]

## 2019-06-28 LAB — EKG 12-LEAD
Atrial Rate: 91 {beats}/min
P Axis: 55 degrees
P-R Interval: 156 ms
Q-T Interval: 356 ms
QRS Duration: 92 ms
QTc Calculation (Bazett): 437 ms
R Axis: 66 degrees
T Axis: 33 degrees
Ventricular Rate: 91 {beats}/min

## 2019-07-06 ENCOUNTER — Inpatient Hospital Stay
Admit: 2019-07-06 | Discharge: 2019-07-06 | Disposition: A | Discharge status: Home / Self Care | Payer: PRIVATE HEALTH INSURANCE | Attending: Emergency Medicine

## 2019-07-06 ENCOUNTER — Emergency Department: Admit: 2019-07-06 | Payer: PRIVATE HEALTH INSURANCE | Primary: Nurse Practitioner

## 2019-07-06 DIAGNOSIS — J069 Acute upper respiratory infection, unspecified: Secondary | ICD-10-CM

## 2019-07-06 NOTE — ED Provider Notes (Signed)
EMERGENCY DEPARTMENT ENCOUNTER    Pt Name: Carolyn Crane  MRN: [id number]  [place] 03/27/1976  Date of evaluation: 07/06/19  CHIEF COMPLAINT       Chief Complaint   Patient presents with   ??? Pharyngitis     fever   ??? Cough     sob     HISTORY OF PRESENT ILLNESS   Patient is a 44 year old female here with cough, sore throat, subjective fevers, headache, malaise.  She states her symptoms started yesterday.  Denies any known sick contacts.  Only has some mild shortness of breath on exertion.  No history of lung disease.  No chest pain no abdominal pain no vomiting diarrhea.  Denies neck stiffness.  Mild headache not sudden onset.  She would like tested for Covid.  She has had her tonsils out.  Denies any change in voice or trouble swallowing.    REVIEW OF SYSTEMS     Review of Systems   Constitutional: Positive for appetite change, fatigue and fever. Negative for chills.   HENT: Positive for sore throat. Negative for trouble swallowing and voice change.    Eyes: Negative for visual disturbance.   Respiratory: Positive for cough and shortness of breath.    Cardiovascular: Negative for chest pain.   Gastrointestinal: Negative for abdominal pain and vomiting.   Genitourinary: Negative for difficulty urinating.   Musculoskeletal: Positive for myalgias. Negative for neck pain.   Skin: Negative for rash.   Neurological: Positive for headaches. Negative for weakness.   Psychiatric/Behavioral: Negative for confusion.     PASTMEDICAL HISTORY     Past Medical History:   Diagnosis Date   ??? Bipolar 1 disorder ([facility] [place])    ??? Depression    ??? Diabetes mellitus ([place] [facility])    ??? Fibromyalgia    ??? Hyperlipidemia    ??? IBS (irritable bowel syndrome)    ??? PCOS (polycystic ovarian syndrome)      SURGICAL HISTORY       Past Surgical History:   Procedure Laterality Date   ??? APPENDECTOMY     ??? CHOLECYSTECTOMY     ??? FOOT SURGERY Left     plantar fascitiis release   ??? SHOULDER SURGERY Left    ??? TONSILLECTOMY       CURRENT MEDICATIONS       Previous  Medications    ALBUTEROL SULFATE HFA (PROAIR HFA) 108 (90 BASE) MCG/ACT INHALER    Inhale 2 puffs into the lungs every 4 hours as needed for Wheezing or Shortness of Breath    ARIPIPRAZOLE (ABILIFY) 15 MG TABLET    Take 30 mg by mouth daily     ATORVASTATIN (LIPITOR) 80 MG TABLET    Take 80 mg by mouth daily    DICYCLOMINE (BENTYL) 10 MG CAPSULE    Take 1 capsule by mouth every 6 hours as needed (cramps)    ESCITALOPRAM (LEXAPRO) 10 MG TABLET    Take 10 mg by mouth daily     FENOFIBRATE 160 MG TABLET    Take 160 mg by mouth daily     INSULIN DEGLUDEC (TRESIBA FLEXTOUCH) 200 UNIT/ML SOPN    Inject 100 Units into the skin every morning     LANSOPRAZOLE (PREVACID) 30 MG DELAYED RELEASE CAPSULE    Take 30 mg by mouth daily    LIRAGLUTIDE (VICTOZA SC)    Inject 1.8 Units into the skin daily     NAPROXEN (NAPROSYN) 500 MG TABLET    Take 1 tablet by mouth  2 times daily (with meals)    VALACYCLOVIR (VALTREX) 500 MG TABLET    Take 1,000 mg by mouth 2 times daily as needed      ALLERGIES     is allergic to prochlorperazine; reglan [metoclopramide]; and zofran [ondansetron hcl].  FAMILY HISTORY     has no family status information on file.      SOCIAL HISTORY       Social History     Tobacco Use   ??? Smoking status: Never Smoker   ??? Smokeless tobacco: Never Used   Substance Use Topics   ??? Alcohol use: Yes     Comment: socially   ??? Drug use: No     PHYSICAL EXAM     INITIAL VITALS: BP 129/79    Pulse 97    Temp 98 ??F (36.7 ??C) (Oral)    Resp 16    Ht 5\' 5"  (1.651 m)    Wt 210 lb (95.3 kg)    LMP 07/05/2019    SpO2 97%    BMI 34.95 kg/m??    Physical Exam  Vitals signs and nursing note reviewed.   Constitutional:       General: She is not in acute distress.     Appearance: She is ill-appearing. She is not toxic-appearing or diaphoretic.   HENT:      Head: Normocephalic and atraumatic.      Jaw: There is normal jaw occlusion. No trismus.      Mouth/Throat:      Mouth: Mucous membranes are moist. No angioedema.      Dentition: No  dental abscesses.      Pharynx: Oropharynx is clear. Uvula midline. Posterior oropharyngeal erythema present. No oropharyngeal exudate.      Tonsils: No tonsillar abscesses.   Eyes:      Extraocular Movements: Extraocular movements intact.      Pupils: Pupils are equal, round, and reactive to light.   Neck:      Musculoskeletal: Normal range of motion and neck supple. No neck rigidity.   Cardiovascular:      Rate and Rhythm: Normal rate and regular rhythm.   Pulmonary:      Effort: Pulmonary effort is normal. No respiratory distress.   Abdominal:      Palpations: Abdomen is soft.      Tenderness: There is no abdominal tenderness.   Musculoskeletal: Normal range of motion.         General: No deformity.      Right lower leg: No edema.      Left lower leg: No edema.   Skin:     General: Skin is warm and dry.      Capillary Refill: Capillary refill takes less than 2 seconds.   Neurological:      General: No focal deficit present.      Mental Status: She is alert and oriented to person, place, and time.   Psychiatric:         Thought Content: Thought content normal.         MEDICAL DECISION MAKING:          Please see ED Course below for MDM/ED course.    DDx: Viral illness, pneumonia    All patient's question's and concerns were answered prior to disposition and patient and/or family expressed understanding and agreement of treatment plan.           NIH STROKE SCALE:  PROCEDURES:    Procedures    DIAGNOSTIC RESULTS   EKG:All EKG's are interpreted by the Emergency Department Physician who either signs or Co-signs this chart in the absence of a cardiologist.    RADIOLOGY:All plain film, CT, MRI, and formal ultrasound images (except ED bedside ultrasound) are read by the radiologist, see reports below, unless otherwisenoted in MDM or here.  XR CHEST PORTABLE   Final Result   No acute cardiopulmonary findings           LABS: All lab results were reviewed by myself, and all abnormals are listed below.  Labs  Reviewed   COVID-19       EMERGENCY DEPARTMENTCOURSE:     Patient is a 44 year old female here with upper respiratory symptoms for few days.  She is in no distress she is 97% on room air afebrile nontoxic.  Neck is supple no meningismus.  Normal voice no intraoral abscess.  Does not have tonsils.  Requesting a Covid test.  X-ray performed, will check for pneumonia and treat symptomatically.    X-ray unremarkable.  Patient stable for discharge she is in no distress.  Updated on results.  Recommend symptomatic treatment at home and follow-up with her doctor.  Strict return precautions given.    Vitals:    Vitals:    07/06/19 0644   BP: 129/79   Pulse: 97   Resp: 16   Temp: 98 ??F (36.7 ??C)   TempSrc: Oral   SpO2: 97%   Weight: 210 lb (95.3 kg)   Height: 5\' 5"  (1.651 m)       The patient was given the following medications while in the emergency department:  No orders of the defined types were placed in this encounter.    CONSULTS:  None    FINAL IMPRESSION      1. Acute upper respiratory infection          DISPOSITION/PLAN   DISPOSITION Decision To Discharge 07/06/2019 07:33:50 AM      PATIENT REFERRED TO:  09/05/2019, APRN - CNP  [address]  [place]  [phone]    Schedule an appointment as soon as possible for a visit       [facility] ED  [address] [place]  [phone]    If symptoms worsen    DISCHARGE MEDICATIONS:  New Prescriptions    No medications on file     [phone], MD  Attending Emergency Physician    This note was created with the assistance of a speech-recognition program. While intending to generate a document that actually reflects the content of the visit, no guarantees can be provided that every mistake has been identified and corrected by editing.                    Arlan Organ, MD  07/06/19 [phone]

## 2019-07-06 NOTE — Discharge Instructions (Signed)
Call today or tomorrow to follow up with Everardo All, APRN - CNP  in 3 days.    Drink plenty of water, gatorade, juice.  Avoid drinking alcohol.  Use ibuprofen or Tylenol (unless prescribed medications that have Tylenol in it) for pain.  You can take over the counter Ibuprofen (advil) tablets (4 every 8 hours or 3 every 6 hours or 2 every 4 hours)    Return to the Emergency Department for worsening of cough, fever > 101.5 and not controlled with Tylenol or Ibuprofen, excessive nausea or vomiting, worsening of nasal discharge, any other care or concern.

## 2019-07-07 LAB — COVID-19: SARS-CoV-2: NOT DETECTED

## 2019-07-07 NOTE — Care Coordination-Inpatient (Signed)
Patient contacted regarding COVID-19 ??risk.   Discussed COVID-19 related testing which was pending at this time.   Test results were pending. Patient informed of results, if available? Yes    Ambulatory Care Manager contacted the patient by telephone to perform post discharge assessment. Call within 2 business days of discharge: Yes. Verified name and DOB with patient as identifiers. Provided introduction to self, and explanation of the CTN/ACM role, and reason for call due to risk factors for infection and/or exposure to COVID-19.     Symptoms reviewed with patient who verbalized the following symptoms: cough.      Due to no new or worsening symptoms encounter was not routed to provider for escalation. Discussed follow-up appointments. If no appointment was previously scheduled, appointment scheduling offered: Yes  [facility] follow up appointment(s): No future appointments.  Non-[facility] follow up appointment(s): she will call her pcp    Non-face-to-face services provided:  Reviewed and followed up on pending diagnostic tests and treatments-covid      Advance Care Planning:   Does patient have an Advance Directive:  patient declined education.     Patient has following risk factors of: diabetes. ACM reviewed discharge instructions, medical action plan and red flags such as increased shortness of breath, increasing fever and signs of decompensation with patient who verbalized understanding.   Discussed exposure protocols and quarantine with CDC Guidelines ???What to do if you are sick with coronavirus disease 2019.??? Patient was given an opportunity for questions and concerns. The patient agrees to contact the [facility] exposure line [phone], local health department [place] Department of Health: [phone]) and PCP office for questions related to their healthcare. ACM provided contact information for future needs.    Reviewed and educated patient on any new and changed medications related to discharge diagnosis     Was  patient discharged with a pulse oximeter? No Discussed and confirmed pulse oximeter discharge instructions and when to notify provider or seek emergency care.     Patient/family/caregiver given information for [employer] and agrees to enroll yes  Patient's preferred e-mail:    Patient's preferred phone number: [phone]  Based on Loop alert triggers, patient will be contacted by nurse care manager for worsening symptoms.    Spoke with pt who said she feels about the same. She does have mychart for her covid results. She is agreeable to loop     Pt will be further monitored by COVID Loop Team?? based on severity of symptoms and risk factors.

## 2019-08-09 DIAGNOSIS — L03116 Cellulitis of left lower limb: Secondary | ICD-10-CM

## 2019-08-09 NOTE — ED Provider Notes (Signed)
EMERGENCY DEPARTMENT ENCOUNTER    Pt Name: Carolyn Crane  MRN: [id number]  Birthdate 06-23-1975  Date of evaluation: 08/10/19  CHIEF COMPLAINT       Chief Complaint   Patient presents with   ??? Wound Infection     left lower leg for three weeks     HISTORY OF PRESENT ILLNESS   Patient is a 44 year old female with PMH of insulin-dependent diabetes who presents the ED complaining of infected left leg.  Patient scraped her left lower leg 3 weeks ago and wound has not completely healed.  She is concerned for infection and is here for evaluation.  No other complaints at this time.  No fevers, cough, shortness of breath, chest pain, abdominal pain.    REVIEW OF SYSTEMS     Review of Systems   All other systems reviewed and are negative.    PASTMEDICAL HISTORY     Past Medical History:   Diagnosis Date   ??? Bipolar 1 disorder ([facility])    ??? Depression    ??? Diabetes mellitus ([facility])    ??? Fibromyalgia    ??? Hyperlipidemia    ??? IBS (irritable bowel syndrome)    ??? PCOS (polycystic ovarian syndrome)      SURGICAL HISTORY       Past Surgical History:   Procedure Laterality Date   ??? APPENDECTOMY     ??? CHOLECYSTECTOMY     ??? FOOT SURGERY Left     plantar fascitiis release   ??? SHOULDER SURGERY Left    ??? TONSILLECTOMY       CURRENT MEDICATIONS       Discharge Medication List as of 08/10/2019  1:35 AM      CONTINUE these medications which have NOT CHANGED    Details   LORazepam (ATIVAN) 0.5 MG tablet Take 0.5 mg by mouth 2 times daily as needed for Anxiety.Historical Med      dicyclomine (BENTYL) 10 MG capsule Take 1 capsule by mouth every 6 hours as needed (cramps), Disp-20 capsule,R-0Print      lansoprazole (PREVACID) 30 MG delayed release capsule Take 30 mg by mouth dailyHistorical Med      escitalopram (LEXAPRO) 10 MG tablet Take 10 mg by mouth daily Historical Med      Liraglutide (VICTOZA SC) Inject 1.8 Units into the skin daily Historical Med      atorvastatin (LIPITOR) 80 MG tablet Take 80 mg by mouth dailyHistorical Med      valACYclovir  (VALTREX) 500 MG tablet Take 1,000 mg by mouth 2 times daily as needed Historical Med      fenofibrate 160 MG tablet Take 160 mg by mouth daily Historical Med      Insulin Degludec (TRESIBA FLEXTOUCH) 200 UNIT/ML SOPN Inject 100 Units into the skin every morning Historical Med      ARIPiprazole (ABILIFY) 15 MG tablet Take 30 mg by mouth daily Historical Med           ALLERGIES     is allergic to prochlorperazine; reglan [metoclopramide]; and zofran [ondansetron hcl].  FAMILY HISTORY     has no family status information on file.      SOCIAL HISTORY       Social History     Tobacco Use   ??? Smoking status: Never Smoker   ??? Smokeless tobacco: Never Used   Substance Use Topics   ??? Alcohol use: Yes     Comment: socially   ??? Drug use: No  PHYSICAL EXAM     INITIAL VITALS: BP 121/73    Pulse 95    Temp 98.3 ??F (36.8 ??C)    Resp 14    Ht 5\' 5"  (1.651 m)    Wt 211 lb 0.4 oz (95.7 kg)    SpO2 98%    BMI 35.12 kg/m??    Physical Exam  HENT:      Head: Normocephalic.      Right Ear: External ear normal.      Left Ear: External ear normal.      Nose: Nose normal.   Eyes:      Conjunctiva/sclera: Conjunctivae normal.   Cardiovascular:      Rate and Rhythm: Normal rate.   Pulmonary:      Effort: Pulmonary effort is normal.   Abdominal:      General: Abdomen is flat.   Skin:     General: Skin is dry.      Comments: Healing small 2 cm round lesion, with focal redness around edges.  No purulent discharge.   Neurological:      Mental Status: She is alert. Mental status is at baseline.   Psychiatric:         Mood and Affect: Mood normal.         Behavior: Behavior normal.      Comments:     Limited exam secondary to Covid-19 pandemic.         MEDICAL DECISION MAKING:   The patient is hemodynamically stable, afebrile, nontoxic-appearing.  Physical exam notable for 2 cm healing lesion with localized redness around edges  Based on history and exam patient has local infection versus early cellulitis.  Because of history of diabetes will  empirically treat with antibiotics.  ED plan for clindamycin, discharge.         DIAGNOSTIC RESULTS   EKG:All EKG's are interpreted by the Emergency Department Physician who either signs or Co-signs this chart in the absence of a cardiologist.        RADIOLOGY:All plain film, CT, MRI, and formal ultrasound images (except ED bedside ultrasound) are read by the radiologist, see reports below, unless otherwisenoted in MDM or here.  No orders to display     LABS: All lab results were reviewed by myself, and all abnormals are listed below.  Labs Reviewed - No data to display    EMERGENCY DEPARTMENTCOURSE:         Vitals:    Vitals:    08/09/19 2114   BP: 121/73   Pulse: 95   Resp: 14   Temp: 98.3 ??F (36.8 ??C)   SpO2: 98%   Weight: 211 lb 0.4 oz (95.7 kg)   Height: 5\' 5"  (1.651 m)       The patient was given the following medications while in the emergency department:  Orders Placed This Encounter   Medications   ??? clindamycin (CLEOCIN) capsule 450 mg     Order Specific Question:   Antimicrobial Indications     Answer:   Aspiration Pneumonia   ??? clindamycin (CLEOCIN) 300 MG capsule     Sig: Take 1 capsule by mouth 4 times daily for 10 days     Dispense:  40 capsule     Refill:  0     CONSULTS:  None    FINAL IMPRESSION      1. Cellulitis of left lower extremity          DISPOSITION/PLAN   DISPOSITION Decision To Discharge 08/10/2019  09:32:35 AM      PATIENT REFERRED TO:  Mack Hook, APRN - CNP  [address] [place] [address]  [phone]    In 2 days      DISCHARGE MEDICATIONS:  Discharge Medication List as of 08/10/2019  1:35 AM      START taking these medications    Details   clindamycin (CLEOCIN) 300 MG capsule Take 1 capsule by mouth 4 times daily for 10 days, Disp-40 capsule, R-0Normal           Jim Like, MD  Attending Emergency Physician                    Myriam Jacobson, MD  08/10/19 0530

## 2019-08-10 ENCOUNTER — Inpatient Hospital Stay
Admit: 2019-08-10 | Discharge: 2019-08-10 | Disposition: A | Discharge status: Home / Self Care | Payer: PRIVATE HEALTH INSURANCE | Attending: Emergency Medicine

## 2019-08-10 MED ORDER — CLINDAMYCIN HCL 150 MG PO CAPS
150 MG | Freq: Once | ORAL | Status: AC
Start: 2019-08-10 — End: 2019-08-10
  Administered 2019-08-10: 05:00:00 450 mg via ORAL

## 2019-08-10 MED ORDER — CLINDAMYCIN HCL 300 MG PO CAPS
300 MG | ORAL_CAPSULE | Freq: Four times a day (QID) | ORAL | 0 refills | Status: AC
Start: 2019-08-10 — End: 2019-08-20

## 2019-08-10 MED FILL — CLINDAMYCIN HCL 150 MG PO CAPS: 150 mg | ORAL | Qty: 3

## 2019-08-10 NOTE — Discharge Instructions (Signed)
Follow this medication paln for 3 days to control your pain: (warning - be mindful of over-the-counter medications that contain Tylenol.  9am Tylenol 1000 mg  12noon Ibuprofen 800 mg  3pm Tylenol 1000 mg  6pm Ibuprofen 800 mg    Return to this emergency room immediately if your symptoms persist, worsen or if new ones form.    Make sure you follow-up with your primary care doctor within the next 1-2 business days.

## 2019-09-17 ENCOUNTER — Emergency Department: Admit: 2019-09-18 | Payer: PRIVATE HEALTH INSURANCE | Primary: Nurse Practitioner

## 2019-09-17 DIAGNOSIS — G43109 Migraine with aura, not intractable, without status migrainosus: Principal | ICD-10-CM

## 2019-09-17 NOTE — ED Provider Notes (Addendum)
EMERGENCY DEPARTMENT ENCOUNTER    Pt Name: Carolyn Crane  MRN: [id number]  Birthdate Jan 25, 1976  Date of evaluation: 09/17/19  CHIEF COMPLAINT       Chief Complaint   Patient presents with   ??? Numbness   ??? Headache     HISTORY OF PRESENT ILLNESS   44 year old female presenting to the emergency room for blurred vision some slight paresthesias to the right side of the face and headache.  Patient reports she was at work earlier today around 3 PM when symptoms started.  She states that her vision seemed off and she had some numbness to the right side of the face.  She states that she had a sharp shooting headache on the right side as well.          REVIEW OF SYSTEMS     Review of Systems   Constitutional: Negative for activity change, chills and diaphoresis.   HENT: Negative for congestion, sinus pain and tinnitus.    Eyes: Positive for visual disturbance.   Respiratory: Negative for apnea, chest tightness and shortness of breath.    Gastrointestinal: Negative for abdominal distention, constipation, diarrhea and vomiting.   Genitourinary: Negative for difficulty urinating and frequency.   Musculoskeletal: Negative for arthralgias and myalgias.   Skin: Negative for color change and rash.   Neurological: Positive for headaches. Negative for dizziness.   Hematological: Negative.    Psychiatric/Behavioral: Negative.        PASTMEDICAL HISTORY     Past Medical History:   Diagnosis Date   ??? Bipolar 1 disorder (HCC)    ??? Depression    ??? Diabetes mellitus (HCC)    ??? Fibromyalgia    ??? Hyperlipidemia    ??? IBS (irritable bowel syndrome)    ??? PCOS (polycystic ovarian syndrome)      Past Problem List  Patient Active Problem List   Diagnosis Code   ??? Chest pain R07.9   ??? 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 Dyslipidemia E78.5   ??? Paresthesia R20.2     SURGICAL HISTORY       Past Surgical History:   Procedure Laterality Date   ??? APPENDECTOMY     ??? CHOLECYSTECTOMY     ??? FOOT SURGERY Left     plantar fascitiis release   ???  SHOULDER SURGERY Left    ??? TONSILLECTOMY       CURRENT MEDICATIONS       Previous Medications    ARIPIPRAZOLE (ABILIFY) 15 MG TABLET    Take 30 mg by mouth daily     ATORVASTATIN (LIPITOR) 80 MG TABLET    Take 80 mg by mouth daily    DICYCLOMINE (BENTYL) 10 MG CAPSULE    Take 1 capsule by mouth every 6 hours as needed (cramps)    ESCITALOPRAM (LEXAPRO) 10 MG TABLET    Take 10 mg by mouth daily     FAMOTIDINE (PEPCID) 20 MG TABLET    Take 20 mg by mouth 2 times daily    FENOFIBRATE 160 MG TABLET    Take 160 mg by mouth daily     GLIMEPIRIDE (AMARYL) 1 MG TABLET    Take 1 mg by mouth every morning (before breakfast)    INSULIN DEGLUDEC (TRESIBA FLEXTOUCH) 200 UNIT/ML SOPN    Inject 100 Units into the skin every morning     LANSOPRAZOLE (PREVACID) 30 MG DELAYED RELEASE CAPSULE    Take 30 mg by mouth daily  LIRAGLUTIDE (VICTOZA SC)    Inject 1.8 Units into the skin daily     LORAZEPAM (ATIVAN) 0.5 MG TABLET    Take 0.5 mg by mouth 2 times daily as needed for Anxiety.    VALACYCLOVIR (VALTREX) 500 MG TABLET    Take 1,000 mg by mouth 2 times daily as needed      ALLERGIES     is allergic to prochlorperazine, reglan [metoclopramide], and zofran [ondansetron hcl].  FAMILY HISTORY     has no family status information on file.      SOCIAL HISTORY       Social History     Tobacco Use   ??? Smoking status: Never Smoker   ??? Smokeless tobacco: Never Used   Vaping Use   ??? Vaping Use: Never used   Substance Use Topics   ??? Alcohol use: Yes     Comment: socially   ??? Drug use: No     PHYSICAL EXAM     INITIAL VITALS: BP 119/84    Pulse 64    Temp 97.8 ??F (36.6 ??C)    Resp 16    Ht 5\' 5"  (1.651 m)    Wt 210 lb 3.2 oz (95.3 kg)    LMP 09/10/2019    SpO2 97%    BMI 34.98 kg/m??    Physical Exam  Constitutional:       General: She is not in acute distress.     Appearance: She is well-developed.   HENT:      Head: Normocephalic.   Eyes:      Pupils: Pupils are equal, round, and reactive to light.   Cardiovascular:      Rate and Rhythm:  Normal rate and regular rhythm.      Heart sounds: Normal heart sounds.   Pulmonary:      Effort: Pulmonary effort is normal. No respiratory distress.      Breath sounds: Normal breath sounds.   Abdominal:      General: Bowel sounds are normal.      Palpations: Abdomen is soft.      Tenderness: There is no abdominal tenderness.   Musculoskeletal:         General: Normal range of motion.   Skin:     General: Skin is warm and dry.   Neurological:      Mental Status: She is alert and oriented to person, place, and time.         MEDICAL DECISION MAKING:     44 year old female presenting to the emergency room for intermittent blurred vision to the right eye paresthesias and right-sided headache.  NIH here in the emergency room is 0.  CT head CTA head and neck completed here showing no acute pathology.  Plan at this time is for aspirin per neurology as well as MRI tomorrow morning.  Case discussed with [facility] who is willing to accept.      ED Course as of Sep 17 716   Mon Sep 18, 2019   0109 Discussed with Dr. 0110 from neurology plan for CTA head and neck MRI tomorrow loading dose aspirin 324    [EG]   0329 Patient reporting some paresthesias in the right fingertips and hand.  She still has normal sensation and strength in the hand.  We will continue with stroke work-up.    [EG]      ED Course User Index  [EG] Elesa Hacker, MD       CRITICAL CARE:  PROCEDURES:    Procedures    DIAGNOSTIC RESULTS   EKG:All EKG's are interpreted by the Emergency Department Physician who either signs or Co-signs this chart in the absence of a cardiologist.    EKG-sinus rate 76  No ST or T wave changes  PR 156  QTc 441  Unremarkable EKG      RADIOLOGY:All plain film, CT, MRI, and formal ultrasound images (except ED bedside ultrasound) are read by the radiologist, see reports below, unless otherwisenoted in MDM or here.  CTA HEAD NECK W CONTRAST   Final Result   Unremarkable CTA of the head and neck.         CT HEAD WO  CONTRAST   Final Result   No acute intracranial abnormality.         MRI brain without contrast    (Results Pending)     LABS: All lab results were reviewed by myself, and all abnormals are listed below.  Labs Reviewed   CBC WITH AUTO DIFFERENTIAL - Abnormal; Notable for the following components:       Result Value    Seg Neutrophils 67 (*)     All other components within normal limits   BASIC METABOLIC PANEL W/ REFLEX TO MG FOR LOW K - Abnormal; Notable for the following components:    Glucose 232 (*)     All other components within normal limits   POC GLUCOSE FINGERSTICK - Abnormal; Notable for the following components:    POC Glucose 226 (*)     All other components within normal limits   TROPONIN   PROTIME-INR   APTT   POCT GLUCOSE   POCT GLUCOSE   POCT GLUCOSE       EMERGENCY DEPARTMENTCOURSE:         Vitals:    Vitals:    09/18/19 0400 09/18/19 0430 09/18/19 0500 09/18/19 0530   BP: 121/76 121/79 (!) 129/97 119/84   Pulse: 69 66 78 64   Resp:       Temp:       TempSrc:       SpO2: 98% 97% 97% 97%   Weight:       Height:           The patient was given the following medications while in the emergency department:  Orders Placed This Encounter   Medications   ??? insulin regular (HUMULIN R;NOVOLIN R) injection 5 Units   ??? aspirin chewable tablet 324 mg   ??? 0.9 % sodium chloride bolus   ??? sodium chloride flush 0.9 % injection 10 mL   ??? iopamidol (ISOVUE-370) 76 % injection 75 mL   ??? ARIPiprazole (ABILIFY) tablet 30 mg   ??? atorvastatin (LIPITOR) tablet 80 mg   ??? escitalopram (LEXAPRO) tablet 10 mg   ??? famotidine (PEPCID) tablet 20 mg   ??? fenofibrate (TRIGLIDE) tablet 160 mg   ??? glipiZIDE (GLUCOTROL) tablet 2.5 mg   ??? Liraglutide (VICTOZA) SC injection 1.8 mg     Order Specific Question:   Please select a reason the therapeutic interchange was not accepted:     Answer:   Okay for Pharmacy to Substitute   ??? LORazepam (ATIVAN) tablet 0.5 mg   ??? Insulin Degludec SOPN 100 Units   ??? sodium chloride flush 0.9 % injection 10  mL   ??? sodium chloride flush 0.9 % injection 10 mL   ??? 0.9 % sodium chloride infusion   ??? magnesium hydroxide (MILK OF MAGNESIA) 400 MG/5ML suspension  30 mL   ??? enoxaparin (LOVENOX) injection 40 mg   ??? OR Linked Order Group    ??? aspirin EC tablet 81 mg    ??? aspirin suppository 300 mg   ??? atorvastatin (LIPITOR) tablet 40 mg   ??? insulin lispro (HUMALOG) injection vial 0-12 Units   ??? insulin lispro (HUMALOG) injection vial 0-6 Units   ??? glucose (GLUTOSE) 40 % oral gel 15 g   ??? dextrose 50 % IV solution   ??? glucagon (rDNA) injection 1 mg   ??? dextrose 5 % solution     CONSULTS:  IP CONSULT TO NEUROLOGY  IP CONSULT TO INTERNAL MEDICINE  IP CONSULT TO NEUROLOGY    FINAL IMPRESSION      1. Stroke-like symptoms          DISPOSITION/PLAN   DISPOSITION Admitted 09/18/2019 03:26:15 AM      PATIENT REFERRED TO:  No follow-up provider specified.  DISCHARGE MEDICATIONS:  New Prescriptions    No medications on file     Tamieka Rancourt Arnetha Courser, MD  Attending Emergency Physician                 Cicero Duck, MD  09/18/19 2025       Cicero Duck, MD  09/18/19 [phone]

## 2019-09-17 NOTE — ED Notes (Signed)
Patient presents to ED with complaints of blurred vision with right sided facial numbness and headache. Reports that symptoms started at work today around 3pm. Still with complaints of feeling as though vision is off. No focal neurological deficits. Patient AOx4 and in no acute distress.      Pearla Dubonnet, RN  09/18/19 [phone]

## 2019-09-18 ENCOUNTER — Inpatient Hospital Stay: Admit: 2019-09-18 | Payer: PRIVATE HEALTH INSURANCE | Primary: Nurse Practitioner

## 2019-09-18 ENCOUNTER — Emergency Department: Admit: 2019-09-18 | Payer: PRIVATE HEALTH INSURANCE | Primary: Nurse Practitioner

## 2019-09-18 ENCOUNTER — Inpatient Hospital Stay
Admission: EM | Admit: 2019-09-18 | Discharge: 2019-09-18 | Disposition: A | Discharge status: Home / Self Care | Payer: PRIVATE HEALTH INSURANCE | Source: Other Acute Inpatient Hospital | Admitting: Internal Medicine

## 2019-09-18 LAB — BASIC METABOLIC PANEL W/ REFLEX TO MG FOR LOW K
Anion Gap: 13 mmol/L (ref 9–17)
BUN: 15 mg/dL (ref 6–20)
Bun/Cre Ratio: 19 (ref 9–20)
CO2: 22 mmol/L (ref 20–31)
Calcium: 9.8 mg/dL (ref 8.6–10.4)
Chloride: 102 mmol/L (ref 98–107)
Creatinine: 0.79 mg/dL (ref 0.50–0.90)
GFR African American: >60 mL/min (ref >60)
GFR Non-African American: >60 mL/min (ref >60)
Glucose: 232 mg/dL — ABNORMAL HIGH (ref 70–99)
Potassium: 4.3 mmol/L (ref 3.7–5.3)
Sodium: 137 mmol/L (ref 135–144)

## 2019-09-18 LAB — CBC WITH AUTO DIFFERENTIAL
Absolute Eos #: 0.11 10*3/uL (ref 0.00–0.44)
Absolute Immature Granulocyte: 0.03 10*3/uL (ref 0.00–0.30)
Absolute Lymph #: 2.32 10*3/uL (ref 1.10–3.70)
Absolute Mono #: 0.62 10*3/uL (ref 0.10–1.20)
Basophils Absolute: 0.04 10*3/uL (ref 0.00–0.20)
Basophils: 0 % (ref 0–2)
Eosinophils %: 1 % (ref 1–4)
Hematocrit: 38.9 % (ref 36.3–47.1)
Hemoglobin: 12.8 g/dL (ref 11.9–15.1)
Immature Granulocytes: 0 %
Lymphocytes: 25 % (ref 24–43)
MCH: 30.2 pg (ref 25.2–33.5)
MCHC: 32.9 g/dL (ref 28.4–34.8)
MCV: 91.7 fL (ref 82.6–102.9)
MPV: 9.6 fL (ref 8.1–13.5)
Monocytes: 7 % (ref 3–12)
NRBC Automated: 0 per 100 WBC
Platelets: 380 10*3/uL (ref 138–453)
RBC: 4.24 m/uL (ref 3.95–5.11)
RDW: 12.2 % (ref 11.8–14.4)
Seg Neutrophils: 67 % — ABNORMAL HIGH (ref 36–65)
Segs Absolute: 6.14 10*3/uL (ref 1.50–8.10)
WBC: 9.3 10*3/uL (ref 3.5–11.3)

## 2019-09-18 LAB — APTT: PTT: 25.2 s (ref 23.9–33.8)

## 2019-09-18 LAB — POC GLUCOSE FINGERSTICK
POC Glucose: 226 mg/dL — ABNORMAL HIGH (ref 65–105)
POC Glucose: 158 mg/dL — ABNORMAL HIGH (ref 65–105)

## 2019-09-18 LAB — TROPONIN: Troponin, High Sensitivity: <6 ng/L (ref 0–14)

## 2019-09-18 LAB — PROTIME-INR
INR: 0.9
Protime: 11.6 s (ref 11.5–14.2)

## 2019-09-18 MED ORDER — ASPIRIN 81 MG PO TBEC
81 MG | Freq: Every day | ORAL | Status: DC
Start: 2019-09-18 — End: 2019-09-18

## 2019-09-18 MED ORDER — INSULIN LISPRO 100 UNIT/ML SC SOLN
100 UNIT/ML | Freq: Every evening | SUBCUTANEOUS | Status: DC
Start: 2019-09-18 — End: 2019-09-18

## 2019-09-18 MED ORDER — NORMAL SALINE FLUSH 0.9 % IV SOLN
0.9 % | INTRAVENOUS | Status: DC | PRN
Start: 2019-09-18 — End: 2019-09-18

## 2019-09-18 MED ORDER — DEXTROSE 5 % IV SOLN
5 % | INTRAVENOUS | Status: DC | PRN
Start: 2019-09-18 — End: 2019-09-18

## 2019-09-18 MED ORDER — INSULIN LISPRO 100 UNIT/ML SC SOLN
100 UNIT/ML | Freq: Three times a day (TID) | SUBCUTANEOUS | Status: DC
Start: 2019-09-18 — End: 2019-09-18

## 2019-09-18 MED ORDER — NORMAL SALINE FLUSH 0.9 % IV SOLN
0.9 | Freq: Two times a day (BID) | INTRAVENOUS | Status: DC
Start: 2019-09-18 — End: 2019-09-18
  Administered 2019-09-18: 14:00:00 10 mL via INTRAVENOUS

## 2019-09-18 MED ORDER — GLIPIZIDE 5 MG PO TABS
5 MG | Freq: Every day | ORAL | Status: DC
Start: 2019-09-18 — End: 2019-09-18

## 2019-09-18 MED ORDER — ATORVASTATIN CALCIUM 40 MG PO TABS
40 MG | Freq: Every evening | ORAL | Status: DC
Start: 2019-09-18 — End: 2019-09-18

## 2019-09-18 MED ORDER — ASPIRIN 300 MG RE SUPP
300 MG | Freq: Every day | RECTAL | Status: DC
Start: 2019-09-18 — End: 2019-09-18

## 2019-09-18 MED ORDER — DEXTROSE 50 % IV SOLN
50 % | INTRAVENOUS | Status: DC | PRN
Start: 2019-09-18 — End: 2019-09-18

## 2019-09-18 MED ORDER — ASPIRIN 81 MG PO CHEW
81 MG | Freq: Once | ORAL | Status: AC
Start: 2019-09-18 — End: 2019-09-18
  Administered 2019-09-18: 05:00:00 324 mg via ORAL

## 2019-09-18 MED ORDER — SODIUM CHLORIDE 0.9 % IV SOLN
0.9 | INTRAVENOUS | Status: DC | PRN
Start: 2019-09-18 — End: 2019-09-18

## 2019-09-18 MED ORDER — LIRAGLUTIDE 18 MG/3ML SC SOPN
18 MG/3ML | Freq: Every day | SUBCUTANEOUS | Status: DC
Start: 2019-09-18 — End: 2019-09-18

## 2019-09-18 MED ORDER — LORAZEPAM 1 MG PO TABS
1 MG | Freq: Once | ORAL | Status: DC
Start: 2019-09-18 — End: 2019-09-18

## 2019-09-18 MED ORDER — GLUCOSE 40 % PO GEL
40 % | ORAL | Status: DC | PRN
Start: 2019-09-18 — End: 2019-09-18

## 2019-09-18 MED ORDER — FAMOTIDINE 20 MG PO TABS
20 MG | Freq: Two times a day (BID) | ORAL | Status: DC
Start: 2019-09-18 — End: 2019-09-18

## 2019-09-18 MED ORDER — ARIPIPRAZOLE 15 MG PO TABS
15 MG | Freq: Every day | ORAL | Status: DC
Start: 2019-09-18 — End: 2019-09-18

## 2019-09-18 MED ORDER — FENOFIBRATE 160 MG PO TABS
160 MG | Freq: Every day | ORAL | Status: DC
Start: 2019-09-18 — End: 2019-09-18

## 2019-09-18 MED ORDER — IOPAMIDOL 76 % IV SOLN
76 % | Freq: Once | INTRAVENOUS | Status: AC | PRN
Start: 2019-09-18 — End: 2019-09-18
  Administered 2019-09-18: 06:00:00 75 mL via INTRAVENOUS

## 2019-09-18 MED ORDER — LORAZEPAM 1 MG PO TABS
1 MG | Freq: Two times a day (BID) | ORAL | Status: DC | PRN
Start: 2019-09-18 — End: 2019-09-18
  Administered 2019-09-18: 12:00:00 0.5 mg via ORAL

## 2019-09-18 MED ORDER — ASPIRIN 81 MG PO TBEC
81 MG | ORAL_TABLET | Freq: Every day | ORAL | 3 refills | Status: AC
Start: 2019-09-18 — End: ?

## 2019-09-18 MED ORDER — SUMATRIPTAN SUCCINATE 50 MG PO TABS
50 MG | ORAL_TABLET | Freq: Every day | ORAL | 1 refills | Status: AC | PRN
Start: 2019-09-18 — End: ?

## 2019-09-18 MED ORDER — MAGNESIUM HYDROXIDE 400 MG/5ML PO SUSP
400 MG/5ML | Freq: Every day | ORAL | Status: DC | PRN
Start: 2019-09-18 — End: 2019-09-18

## 2019-09-18 MED ORDER — ATORVASTATIN CALCIUM 80 MG PO TABS
80 MG | Freq: Every evening | ORAL | Status: DC
Start: 2019-09-18 — End: 2019-09-18

## 2019-09-18 MED ORDER — ENOXAPARIN SODIUM 40 MG/0.4ML SC SOLN
40 | Freq: Every day | SUBCUTANEOUS | Status: DC
Start: 2019-09-18 — End: 2019-09-18

## 2019-09-18 MED ORDER — ESCITALOPRAM OXALATE 10 MG PO TABS
10 MG | Freq: Every day | ORAL | Status: DC
Start: 2019-09-18 — End: 2019-09-18

## 2019-09-18 MED ORDER — INSULIN DEGLUDEC 200 UNIT/ML SC SOPN
200 UNIT/ML | Freq: Every morning | SUBCUTANEOUS | Status: DC
Start: 2019-09-18 — End: 2019-09-18

## 2019-09-18 MED ORDER — INSULIN REGULAR HUMAN 100 UNIT/ML IJ SOLN
100 UNIT/ML | Freq: Once | INTRAMUSCULAR | Status: AC
Start: 2019-09-18 — End: 2019-09-18
  Administered 2019-09-18: 05:00:00 5 [IU] via INTRAVENOUS

## 2019-09-18 MED ORDER — SODIUM CHLORIDE 0.9 % IV BOLUS
0.9 % | Freq: Once | INTRAVENOUS | Status: AC
Start: 2019-09-18 — End: 2019-09-18
  Administered 2019-09-18: 06:00:00 80 mL via INTRAVENOUS

## 2019-09-18 MED ORDER — NORMAL SALINE FLUSH 0.9 % IV SOLN
0.9 % | INTRAVENOUS | Status: DC | PRN
Start: 2019-09-18 — End: 2019-09-18
  Administered 2019-09-18: 06:00:00 10 mL via INTRAVENOUS

## 2019-09-18 MED ORDER — GLUCAGON HCL RDNA (DIAGNOSTIC) 1 MG IJ SOLR
1 MG | INTRAMUSCULAR | Status: DC | PRN
Start: 2019-09-18 — End: 2019-09-18

## 2019-09-18 MED FILL — ABILIFY 15 MG PO TABS: 15 mg | ORAL | Qty: 2

## 2019-09-18 MED FILL — INSULIN REGULAR HUMAN 100 UNIT/ML IJ SOLN: 100 [IU]/mL | INTRAMUSCULAR | Qty: 0.05

## 2019-09-18 MED FILL — ASPIRIN LOW STRENGTH 81 MG PO CHEW: 81 mg | ORAL | Qty: 4

## 2019-09-18 MED FILL — LORAZEPAM 1 MG PO TABS: 1 mg | ORAL | Qty: 1

## 2019-09-18 NOTE — Discharge Summary (Signed)
[name]  Office: [place], DO, Calvert Cantor, DO, Ma Hillock, DO, Dellis Filbert Blood, DO, Patricia Nettle, MD, Stacie Glaze, MD, Erick Alley, MD, Margette Fast, MD, Clydie Braun, MD, Vista Deck, MD, Bland Span, MD, Cherylann Banas, MD, Waldemar Dickens, DO, Harold Hedge, MD, Danella Sensing, DO, Jonetta Speak, MD,  Yetta Flock, DO, Clearence Cheek, MD, Synthia Innocent, MD, Vivianne Master, MD, Hilarie Fredrickson, MD, Gean Maidens, CNP, [facility] DelGrosso, CNP, Annie Sable, CNP, Dolly Rias, CNS, Rex Kras, CNP, Augusto Garbe, CNP, Alphonzo Lemmings, CNP, Devota Pace, CNP, Sherlyn Hay, CNP, Melina Copa, PA-C, Pieter Partridge, DNP, Laretta Bolster, CNP, Ricardo Jericho, CNP, Vivien Presto, CNP, Jonelle Sidle, CNP, Darrol Angel, CNP, Canary Brim, [facility]    Discharge Summary     Patient ID: Carolyn Crane  DOB:  26-Apr-1976   MRN: [id number]     ACCOUNT:  [id number]   Patient's PCP: Wonda Horner, APRN - CNP  Admit Date: 09/17/2019   Discharge Date: 09/18/2019     Length of Stay: 1  Code Status:  Full Code  Admitting Physician: No admitting provider for patient encounter.  Discharge Physician: Dominica Severin, APRN - NP     Active Discharge Diagnoses:     Hospital Problem Lists:  Principal Problem:    Paresthesia  Active Problems:    Dyslipidemia    Diabetes mellitus ([place])    Depression    Bipolar 1 disorder (HCC)    Fibromyalgia  Resolved Problems:    * No resolved hospital problems. *      Admission Condition:  good     Discharged Condition: good    Hospital Stay:     Hospital Course:  NADELYN ENRIQUES is a 44 y.o. female who was admitted for the management of  Paresthesia , presented to ER with Numbness and Headache    Patient reported to the emergency department last evening with visual disturbance and pain to the right occipital region of her skull.  Stroke alert was called and patient sets with CT which  was negative.  Patient reported that shortly after arriving at the hospital her symptoms resolved and she has no symptoms at the time my exam or assessment.  Extensive imaging was completed last night and MRI was completed and is normal morning all imaging studies are negative.  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  Case was discussed at length with the patient and treatment options are presented.  Patient is informed that she can await neurology evaluation and undergo further testing.  As patient symptoms have resolved and her imaging is negative patient would like to be discharged with outpatient follow-up.  Patient already has an appointment with her primary care provider in approximately 1 week.  Patient will follow up at that time for further evaluation and treatment.  Of additional note patient is found to be hyperglycemic with a blood sugar of 226 on arrival.  Patient has a known history of diabetes and reports that she takes multiple medications and checks her blood sugar several times a day.  Patient does not wish to have her medication altered by internal medicine and she will discuss her hyperglycemia with her PCP in 1 week.    Again options for care are discussed and patient would like to be discharged with outpatient follow-up.  Internal medicine is comfortable with this, and  patient be discharged later this morning.      Significant therapeutic interventions: Extensive imaging of the brain    Significant Diagnostic Studies:   Labs / Micro:  CBC:   Lab Results   Component Value Date    WBC 9.3 09/17/2019    RBC 4.24 09/17/2019    HGB 12.8 09/17/2019    HCT 38.9 09/17/2019    MCV 91.7 09/17/2019    MCH 30.2 09/17/2019    MCHC 32.9 09/17/2019    RDW 12.2 09/17/2019    PLT 380 09/17/2019     BMP:    Lab Results   Component Value Date    GLUCOSE 232 09/17/2019    NA 137 09/17/2019    K 4.3 09/17/2019    CL 102 09/17/2019    CO2 22 09/17/2019    ANIONGAP 13 09/17/2019    BUN 15  09/17/2019    CREATININE 0.79 09/17/2019    BUNCRER 19 09/17/2019    CALCIUM 9.8 09/17/2019    LABGLOM >60 09/17/2019    GFRAA >60 09/17/2019    GFR      09/17/2019    GFR NOT REPORTED 09/17/2019     U/A:    Lab Results   Component Value Date    COLORU YELLOW 02/22/2019    TURBIDITY CLEAR 02/22/2019    SPECGRAV 1.025 02/22/2019    HGBUR NEGATIVE 02/22/2019    PHUR 5.5 02/22/2019    PROTEINU NEGATIVE 02/22/2019    GLUCOSEU 3+ 02/22/2019    KETUA NEGATIVE 02/22/2019    BILIRUBINUR NEGATIVE 02/22/2019    BILIRUBINUR NEG 05/01/2018    UROBILINOGEN Normal 02/22/2019    NITRU NEGATIVE 02/22/2019    LEUKOCYTESUR NEGATIVE 02/22/2019        Radiology:  CT HEAD WO CONTRAST    Result Date: 09/18/2019  No acute intracranial abnormality.     CTA HEAD NECK W CONTRAST    Result Date: 09/18/2019  Unremarkable CTA of the head and neck.     MRI brain without contrast    Result Date: 09/18/2019  1. No acute intracranial abnormality.  No acute infarct. 2. Mild global parenchymal volume loss.       Consultations:    Consults:     Final Specialist Recommendations/Findings:   IP CONSULT TO NEUROLOGY  IP CONSULT TO INTERNAL MEDICINE  IP CONSULT TO NEUROLOGY      The patient was seen and examined on day of discharge and this discharge summary is in conjunction with any daily progress note from day of discharge.    Discharge plan:     Disposition: Home    Physician Follow Up:     Stoney Bang, APRN - CNP  [address]  [place] [address]  [phone]    In 1 week  follow up appointment       Requiring Further Evaluation/Follow Up POST HOSPITALIZATION/Incidental Findings: Atypical migraine    Diet: regular diet    Activity: As tolerated    Instructions to Patient: Take the Imitrex as needed for migraine symptoms.  See your primary care provider to discuss both your symptoms from this hospital admission and your elevated blood sugar levels.    Discharge Medications:      Medication List      START taking these medications    aspirin 81  MG EC tablet  Take 1 tablet by mouth daily  Start taking on: Sep 19, 2019     SUMAtriptan 50 MG tablet  Commonly  known as: Imitrex  Take 1 tablet by mouth daily as needed for Migraine        CONTINUE taking these medications    ARIPiprazole 15 MG tablet  Commonly known as: ABILIFY     atorvastatin 80 MG tablet  Commonly known as: LIPITOR     dicyclomine 10 MG capsule  Commonly known as: Bentyl  Take 1 capsule by mouth every 6 hours as needed (cramps)     famotidine 20 MG tablet  Commonly known as: PEPCID     fenofibrate 160 MG tablet  Commonly known as: TRIGLIDE     glimepiride 1 MG tablet  Commonly known as: AMARYL     Lexapro 10 MG tablet  Generic drug: escitalopram     LORazepam 0.5 MG tablet  Commonly known as: ATIVAN     Prevacid 30 MG delayed release capsule  Generic drug: lansoprazole     Tresiba FlexTouch 200 UNIT/ML Sopn  Generic drug: Insulin Degludec     Valtrex 500 MG tablet  Generic drug: valACYclovir     VICTOZA SC           Where to Get Your Medications      These medications were sent to [facility] [address], [place] - [address] - P [phone] Carmon Ginsberg [phone]  [address] [place] [address]    Phone: [phone]   ?? aspirin 81 MG EC tablet  ?? SUMAtriptan 50 MG tablet         No discharge procedures on file.    Time Spent on discharge is  40 mins in patient examination, evaluation, counseling as well as medication reconciliation, prescriptions for required medications, discharge plan and follow up.    Electronically signed by   Ivar Drape, APRN - NP  09/18/2019  12:48 PM      Thank you Dr. Everardo All, APRN - CNP for the opportunity to be involved in this patient's care.

## 2019-09-18 NOTE — Progress Notes (Signed)
Pt given discharge instructions and all questions answered. Pt has all belongings and in no distress.

## 2019-09-18 NOTE — Discharge Instructions (Signed)
Your information:  Name: Carolyn Crane  DOB: 26-Jul-1975    Your instructions:  No restrictions    What to do after you leave the hospital:    Recommended diet: regular diet    Recommended activity: activity as tolerated        The following personal items were collected during your admission and were returned to you:    Valuables  Dentures: None  Vision - Corrective Lenses: None  Hearing Aid: None  Jewelry: None  Body Piercings Removed: N/A  Clothing: Pants, Shirt, Footwear  Were All Patient Medications Collected?: Not Applicable  Other Valuables: Cell phone, Purse, Other (Comment) Copy)  Valuables Given To: Patient  Patient approves for provider to speak to responsible person post operatively: No    Information obtained by:  By signing below, I understand that if any problems occur once I leave the hospital I am to contact my PCP.  I understand and acknowledge receipt of the instructions indicated above.

## 2019-09-18 NOTE — Care Coordination-Inpatient (Signed)
Case Management Initial Discharge Plan  SHEBA WHALING,         Readmission Risk              Risk of Unplanned Readmission:  15             Met with:patient to discuss discharge plans.   Information verified: address, contacts, phone number, DOB, insurance Yes  PCP: Wonda Horner, APRN - CNP  Date of last visit: 07/03/19    Insurance Provider: [place]     Discharge Planning  Current Residence:  Apt with boyfriend   Living Arrangements:  Spouse/Significant Other, Children   Home has 1 stories/6-7 stairs to climb down basement apt   Support Systems:  Spouse/Significant Other, Children  Current Services PTA:  na Agency: na  Patient able to perform ADL's:Independent  DME in home:  Glucometer, victoza tresiba  DME used to aid ambulation prior to admission:   NA  DME used during admission:  NA    Potential Assistance Needed:  N/A    Pharmacy: [facility] Medications:  No  Does patient want to participate in local refill/ meds to beds program?  No    Patient agreeable to home care: No  Freedom of choice provided:  n/a      Type of Home Care Services:  None  Patient expects to be discharged to:  home    Prior SNF/Rehab Placement and Facility: no  Agreeable to SNF/Rehab: No  Freedom of choice provided: n/a  Social Services Evaluation: n/a    Expected Discharge date:  09/18/19  Follow Up Appointment: Best Day/ Time: Monday AM    Transportation provider: self/family  Transportation arrangements needed for discharge: No    Discharge Plan: Poss DC today pending neuro. Pt is from home, independent, works, drives.  DME-glucometer. No needs.     Neuro consult   CTA head/neck unremarkable       Electronically signed by Gypsy Lore, RN on 09/18/19 at 9:55 AM EDT

## 2019-09-18 NOTE — ACP (Advance Care Planning) (Signed)
Advance Care Planning     Advance Care Planning Activator (Inpatient)  Conversation Note      Date of ACP Conversation: 09/18/2019     Conversation Conducted with: Patient with Decision Making Capacity    ACP Activator: Sonda Primes, RN        Health Care Decision Maker: self/mom    Current Designated Health Care Decision Maker: self    Click here to complete Healthcare Decision Makers including section of the Healthcare Decision Maker Relationship (ie "Primary")  Today we documented Decision Maker(s) consistent with Legal Next of Kin hierarchy.    Care Preferences    Ventilation:  "If you were in your present state of health and suddenly became very ill and were unable to breathe on your own, what would your preference be about the use of a ventilator (breathing machine) if it were available to you?"      Would the patient desire the use of ventilator (breathing machine)?: yes    "If your health worsens and it becomes clear that your chance of recovery is unlikely, what would your preference be about the use of a ventilator (breathing machine) if it were available to you?"     Would the patient desire the use of ventilator (breathing machine)?: No      Resuscitation  "CPR works best to restart the heart when there is a sudden event, like a heart attack, in someone who is otherwise healthy. Unfortunately, CPR does not typically restart the heart for people who have serious health conditions or who are very sick."    "In the event your heart stopped as a result of an underlying serious health condition, would you want attempts to be made to restart your heart (answer "yes" for attempt to resuscitate) or would you prefer a natural death (answer "no" for do not attempt to resuscitate)?" yes       []  Yes   []  No   Educated Patient / Decision Maker regarding differences between Advance Directives and portable DNR orders.    Length of ACP Conversation in minutes:  15  Conversation Outcomes:  [x]  ACP discussion  completed  []  Existing advance directive reviewed with patient; no changes to patient's previously recorded wishes  []  New Advance Directive completed  []  Portable Do Not Rescitate prepared for Provider review and signature  []  POLST/POST/MOLST/MOST prepared for Provider review and signature      Follow-up plan:    [x]  Schedule follow-up conversation to continue planning  []  Referred individual to Provider for additional questions/concerns   []  Advised patient/agent/surrogate to review completed ACP document and update if needed with changes in condition, patient preferences or care setting    []  This note routed to one or more involved healthcare providers        Pt is legally divorced, no children, 2 siblings, mom is next of kin.

## 2019-09-18 NOTE — Progress Notes (Signed)
Speech Language Pathology  Facility/Department: [facility]  Initial Speech/Language/Cognitive Assessment    NAME: Carolyn Crane  DOB: 02/10/1976   MRN: [id number]  ADMISSION DATE: 09/17/2019  ADMITTING DIAGNOSIS: has Chest pain; Diabetes 1.5, managed as type 2 (HCC); Dyslipidemia; Paresthesia; Diabetes mellitus (HCC); Depression; Bipolar 1 disorder (HCC); and Fibromyalgia on their problem list.    Date of Eval: 09/18/2019   Evaluating Therapist: Tally Due, SLP    RECENT RESULTS  CT OF HEAD/MRI: 09/18/2019    Impression   1. No acute intracranial abnormality. ??No acute infarct.   2. Mild global parenchymal volume loss.     Primary Complaint: Patient presents to ED with complaints of blurred vision with right sided facial numbness and headache. Reports that symptoms started at work today around 3pm. Still with complaints of feeling as though vision is off. No focal neurological deficits. Patient AOx4 and in no acute distress.     Pain:  Pain Assessment  Pain Level: 6    Assessment:  Pt presents with no apparent cognitive deficits at this time.  No dysarthria noted, no oral motor deficits. No further ST is recommended.  Verbal education provided.     Recommendations:  Requires SLP Intervention: No  D/C Recommendations: No therapy recommended at discharge.    Subjective:   Previous level of function and limitations:   General  Chart Reviewed: Yes  Family / Caregiver Present: No     Vision  Vision: Within Functional Limits  Hearing  Hearing: Within functional limits     Objective:  Oral/Motor  Oral Motor: Within functional limits    Expression  Primary Mode of Expression: Verbal    Motor Speech  Motor Speech: Within Functional Limits    Cognition:   Orientation  Overall Orientation Status: Within Normal Limits  Memory  Memory: Within Funtional Limits  Problem Solving  Problem Solving: Within Functional Limits  Abstract Reasoning  Abstract Reasoning: Within Functional  Limits  Safety/Judgement  Safety/Judgement: Within Functional Limits  Verbal Sequencing: WFL  Word Associations: WFL  Word Generation: WFL    Prognosis:  Speech Therapy Prognosis  Prognosis: Good  Individuals consulted  Consulted and agree with results and recommendations: Patient    Education:  Patient Education: yes  Patient Education Response: Verbalizes understanding          Therapy Time:   Individual Concurrent Group Co-treatment   Time In 1040         Time Out 1055         Minutes 15                 Horice Carrero, SLP  09/18/2019 10:49 AM

## 2019-09-18 NOTE — H&P (Signed)
[facility]  Office: [phone]  Ardelle Anton, DO, Susy Frizzle, DO, Freddi Che, DO, Tinnie Gens Blood, DO, Salome Arnt, MD, Lowry Bowl, MD, Arnold Long, MD, Chad Cordial, MD, Yolanda Manges, MD, Minus Breeding, MD, Seabron Spates, MD, Yolanda Bonine, MD, Silvio Clayman, DO, Frances Maywood, MD, Juleen Starr, DO, Ree Shay, MD,  Orion Modest, DO, Dinah Beers, MD, Vickki Hearing, MD, Wilnette Kales, MD, Durwin Glaze, MD, Carlena Hurl, CNP, [facility], CNP, Marylin Crosby, CNP, Reola Mosher, CNS, Erick Alley, CNP, Ival Bible, CNP, Berneta Sages, CNP, Gust Rung, CNP, Henry Russel, CNP, Luna Glasgow, PA-C, Bess Kinds, DNP, Franklyn Lor, CNP, Minna Antis, CNP, Farrel Gordon, CNP, Theodosia Blender, CNP, Glennie Hawk, CNP, Hulen Shouts, CNP         [facility]   [facility]    HISTORY AND PHYSICAL EXAMINATION            Date:   09/18/2019  Patient name:  Carolyn Crane  Date of admission:  09/17/2019 10:30 PM  MRN:   [id number]  Account:  [id number]  Date of Birth:  1976/01/19  PCP:    Everardo All, APRN - CNP  Room:   1017/1017-02  Code Status:    Full Code    Chief Complaint:     Chief Complaint   Patient presents with   ??? Numbness   ??? Headache       History Obtained From:     patient    History of Present Illness:     Carolyn Crane is a 44 y.o. Non-hispanic/non latino female who presents with Numbness and Headache   and is admitted to the hospital for the management of Paresthesia.    Patient reported to the emergency department last evening with visual disturbance and pain to the right occipital region of her skull.  Stroke alert was called and patient sets with CT which was negative.  Patient reported that shortly after arriving at the hospital her symptoms resolved and she has no symptoms at the time my exam or assessment.  Extensive imaging was completed last night and MRI was completed and is normal morning all imaging  studies are negative.  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  Case was discussed at length with the patient and treatment options are presented.  Patient is informed that she can await neurology evaluation and undergo further testing.  As patient symptoms have resolved and her imaging is negative patient would like to be discharged with outpatient follow-up.  Patient already has an appointment with her primary care provider in approximately 1 week.  Patient will follow up at that time for further evaluation and treatment.  Of additional note patient is found to be hyperglycemic with a blood sugar of 226 on arrival.  Patient has a known history of diabetes and reports that she takes multiple medications and checks her blood sugar several times a day.  Patient does not wish to have her medication altered by internal medicine and she will discuss her hyperglycemia with her PCP in 1 week.    Again options for care are discussed and patient would like to be discharged with outpatient follow-up.  Internal medicine is comfortable with this and patient be discharged later this morning.    Past Medical History:     Past Medical History:   Diagnosis Date   ??? Bipolar 1 disorder (HCC)    ??? Bipolar 1 disorder (HCC)    ???  Depression    ??? Diabetes mellitus (HCC)    ??? Fibromyalgia    ??? Hyperlipidemia    ??? IBS (irritable bowel syndrome)    ??? PCOS (polycystic ovarian syndrome)         Past Surgical History:     Past Surgical History:   Procedure Laterality Date   ??? APPENDECTOMY     ??? CHOLECYSTECTOMY     ??? FOOT SURGERY Left     plantar fascitiis release   ??? SHOULDER SURGERY Left    ??? TONSILLECTOMY          Medications Prior to Admission:     Prior to Admission medications    Medication Sig Start Date End Date Taking? Authorizing Provider   aspirin 81 MG EC tablet Take 1 tablet by mouth daily 09/19/19  Yes Ivar Drapeaniel C Blaire Palomino, APRN - NP   SUMAtriptan (IMITREX) 50 MG tablet Take 1 tablet by mouth daily as needed  for Migraine 09/18/19  Yes Ivar Drapeaniel C Jaleea Alesi, APRN - NP   glimepiride (AMARYL) 1 MG tablet Take 1 mg by mouth every morning (before breakfast)   Yes Historical Provider, MD   famotidine (PEPCID) 20 MG tablet Take 20 mg by mouth 2 times daily   Yes Historical Provider, MD   LORazepam (ATIVAN) 0.5 MG tablet Take 0.5 mg by mouth 2 times daily as needed for Anxiety.   Yes Historical Provider, MD   escitalopram (LEXAPRO) 10 MG tablet Take 10 mg by mouth daily    Yes Historical Provider, MD   Liraglutide (VICTOZA SC) Inject 1.8 Units into the skin daily    Yes Historical Provider, MD   atorvastatin (LIPITOR) 80 MG tablet Take 80 mg by mouth daily   Yes Historical Provider, MD   valACYclovir (VALTREX) 500 MG tablet Take 1,000 mg by mouth 2 times daily as needed    Yes Historical Provider, MD   fenofibrate 160 MG tablet Take 160 mg by mouth daily    Yes Historical Provider, MD   Insulin Degludec (TRESIBA FLEXTOUCH) 200 UNIT/ML SOPN Inject 100 Units into the skin every morning    Yes Historical Provider, MD   ARIPiprazole (ABILIFY) 15 MG tablet Take 30 mg by mouth daily    Yes Historical Provider, MD   dicyclomine (BENTYL) 10 MG capsule Take 1 capsule by mouth every 6 hours as needed (cramps) 02/22/19   Edna A Lump, APRN - CNP   lansoprazole (PREVACID) 30 MG delayed release capsule Take 30 mg by mouth daily    Historical Provider, MD        Allergies:     Prochlorperazine, Reglan [metoclopramide], and Zofran [ondansetron hcl]    Social History:     Tobacco:    reports that she has never smoked. She has never used smokeless tobacco.  Alcohol:      reports current alcohol use.  Drug Use:  reports no history of drug use.    Family History:     Family History   Problem Relation Age of Onset   ??? Cancer Mother    ??? Stroke Mother    ??? Stroke Father    ??? Cancer Maternal Grandmother    ??? Stroke Maternal Grandmother    ??? Cancer Maternal Grandfather        Review of Systems:     Positive and Negative as described in HPI.    CONSTITUTIONAL:   negative for fevers, chills, sweats, fatigue, weight loss  HEENT:  negative for vision, hearing changes,  runny nose, throat pain  RESPIRATORY:  negative for shortness of breath, cough, congestion, wheezing  CARDIOVASCULAR:  negative for chest pain, palpitations  GASTROINTESTINAL:  negative for nausea, vomiting, diarrhea, constipation, change in bowel habits, abdominal pain   GENITOURINARY:  negative for difficulty of urination, burning with urination, frequency   INTEGUMENT:  negative for rash, skin lesions, easy bruising   HEMATOLOGIC/LYMPHATIC:  negative for swelling/edema   ALLERGIC/IMMUNOLOGIC:  negative for urticaria , itching  ENDOCRINE:  negative increase in drinking, increase in urination, hot or cold intolerance  MUSCULOSKELETAL:  negative joint pains, muscle aches, swelling of joints  NEUROLOGICAL:  negative for headaches, dizziness, lightheadedness, numbness, pain, tingling extremities  BEHAVIOR/PSYCH:  negative for depression, anxiety    Physical Exam:   BP 114/71    Pulse 72    Temp 97.7 ??F (36.5 ??C) (Oral)    Resp 16    Ht 5\' 5"  (1.651 m)    Wt 210 lb 3.2 oz (95.3 kg)    LMP 09/10/2019    SpO2 98%    BMI 34.98 kg/m??   Temp (24hrs), Avg:97.8 ??F (36.6 ??C), Min:97.7 ??F (36.5 ??C), Max:97.9 ??F (36.6 ??C)    Recent Labs     09/17/19  2241 09/18/19  0929   POCGLU 226* 158*     No intake or output data in the 24 hours ending 09/18/19 1243    General Appearance:  alert, well appearing, and in no acute distress  Mental status: oriented to person, place, and time  Head:  normocephalic, atraumatic  Eye: no icterus, redness, pupils equal and reactive, extraocular eye movements intact, conjunctiva clear  Ear: normal external ear, no discharge, hearing intact  Nose:  no drainage noted  Mouth: mucous membranes moist  Neck: supple, no carotid bruits, thyroid not palpable  Lungs: Bilateral equal air entry, clear to auscultation, no wheezing, rales or rhonchi, normal effort  Cardiovascular: normal rate, regular rhythm,  no murmur, gallop, rub.  Abdomen: Soft, nontender, nondistended, normal bowel sounds, no hepatomegaly or splenomegaly  Neurologic: There are no new focal motor or sensory deficits, normal muscle tone and bulk, no abnormal sensation, normal speech, cranial nerves II through XII grossly intact  Skin: No gross lesions, rashes, bruising or bleeding on exposed skin area  Extremities:  peripheral pulses palpable, no pedal edema or calf pain with palpation  Psych: normal affect     Investigations:      Laboratory Testing:  Recent Results (from the past 24 hour(s))   CBC Auto Differential    Collection Time: 09/17/19 10:40 PM   Result Value Ref Range    WBC 9.3 3.5 - 11.3 k/uL    RBC 4.24 3.95 - 5.11 m/uL    Hemoglobin 12.8 11.9 - 15.1 g/dL    Hematocrit 09/19/19 37.9 - 47.1 %    MCV 91.7 82.6 - 102.9 fL    MCH 30.2 25.2 - 33.5 pg    MCHC 32.9 28.4 - 34.8 g/dL    RDW 02.4 09.7 - 35.3 %    Platelets 380 138 - 453 k/uL    MPV 9.6 8.1 - 13.5 fL    NRBC Automated 0.0 0.0 per 100 WBC    Differential Type NOT REPORTED     Seg Neutrophils 67 (H) 36 - 65 %    Lymphocytes 25 24 - 43 %    Monocytes 7 3 - 12 %    Eosinophils % 1 1 - 4 %    Basophils 0 0 - 2 %  Immature Granulocytes 0 0 %    Segs Absolute 6.14 1.50 - 8.10 k/uL    Absolute Lymph # 2.32 1.10 - 3.70 k/uL    Absolute Mono # 0.62 0.10 - 1.20 k/uL    Absolute Eos # 0.11 0.00 - 0.44 k/uL    Basophils Absolute 0.04 0.00 - 0.20 k/uL    Absolute Immature Granulocyte 0.03 0.00 - 0.30 k/uL    WBC Morphology NOT REPORTED     RBC Morphology NOT REPORTED     Platelet Estimate NOT REPORTED    Basic Metabolic Panel w/ Reflex to MG    Collection Time: 09/17/19 10:40 PM   Result Value Ref Range    Glucose 232 (H) 70 - 99 mg/dL    BUN 15 6 - 20 mg/dL    CREATININE 4.43 1.54 - 0.90 mg/dL    Bun/Cre Ratio 19 9 - 20    Calcium 9.8 8.6 - 10.4 mg/dL    Sodium 008 676 - 195 mmol/L    Potassium 4.3 3.7 - 5.3 mmol/L    Chloride 102 98 - 107 mmol/L    CO2 22 20 - 31 mmol/L    Anion Gap 13 9 - 17  mmol/L    GFR Non-African American >60 >60 mL/min    GFR African American >60 >60 mL/min    GFR Comment          GFR Staging NOT REPORTED    Troponin    Collection Time: 09/17/19 10:40 PM   Result Value Ref Range    Troponin, High Sensitivity <6 0 - 14 ng/L    Troponin T NOT REPORTED <0.03 ng/mL    Troponin Interp NOT REPORTED    Protime-INR    Collection Time: 09/17/19 10:40 PM   Result Value Ref Range    Protime 11.6 11.5 - 14.2 sec    INR 0.9    APTT    Collection Time: 09/17/19 10:40 PM   Result Value Ref Range    PTT 25.2 23.9 - 33.8 sec   POC Glucose Fingerstick    Collection Time: 09/17/19 10:41 PM   Result Value Ref Range    POC Glucose 226 (H) 65 - 105 mg/dL   EKG 12 Lead    Collection Time: 09/17/19 10:53 PM   Result Value Ref Range    Ventricular Rate 76 BPM    Atrial Rate 76 BPM    P-R Interval 156 ms    QRS Duration 84 ms    Q-T Interval 392 ms    QTc Calculation (Bazett) 441 ms    P Axis 20 degrees    R Axis 76 degrees    T Axis 57 degrees   POC Glucose Fingerstick    Collection Time: 09/18/19  9:29 AM   Result Value Ref Range    POC Glucose 158 (H) 65 - 105 mg/dL       Imaging/Diagnostics:  CT HEAD WO CONTRAST    Result Date: 09/18/2019  No acute intracranial abnormality.     CTA HEAD NECK W CONTRAST    Result Date: 09/18/2019  Unremarkable CTA of the head and neck.     MRI brain without contrast    Result Date: 09/18/2019  1. No acute intracranial abnormality.  No acute infarct. 2. Mild global parenchymal volume loss.       Assessment :      Hospital Problems         Last Modified POA    * (Principal)  Paresthesia 09/18/2019 Yes    Dyslipidemia 09/18/2019 Yes    Diabetes mellitus (HCC) 09/18/2019 Yes    Depression 09/18/2019 Yes    Bipolar 1 disorder (HCC) 09/18/2019 Yes    Fibromyalgia 09/18/2019 Yes          Plan:     Patient status inpatient in the Progressive Unit/Step down    1. Discharged with as needed Imitrex and follow-up with PCP for hyperglycemia    Consultations:   IP CONSULT TO NEUROLOGY  IP  CONSULT TO INTERNAL MEDICINE  IP CONSULT TO NEUROLOGY    Patient is admitted as inpatient status because of co-morbidities listed above, severity of signs and symptoms as outlined, requirement for current medical therapies and most importantly because of direct risk to patient if care not provided in a hospital setting.  Expected length of stay > 48 hours.    Ivar Drape, APRN - NP  09/18/2019  12:43 PM    Copy sent to Dr. Everardo All, APRN - CNP

## 2019-09-18 NOTE — Progress Notes (Signed)
Pt transferred to room  1017 .  Pt has all belongings and in no distress.  Pt vitals stable. Pt oriented to room and call light use.

## 2019-09-19 LAB — EKG 12-LEAD
Atrial Rate: 76 {beats}/min
P Axis: 20 degrees
P-R Interval: 156 ms
Q-T Interval: 392 ms
QRS Duration: 84 ms
QTc Calculation (Bazett): 441 ms
R Axis: 76 degrees
T Axis: 57 degrees
Ventricular Rate: 76 {beats}/min

## 2019-10-23 ENCOUNTER — Emergency Department: Admit: 2019-10-23 | Payer: PRIVATE HEALTH INSURANCE | Primary: Nurse Practitioner

## 2019-10-23 ENCOUNTER — Inpatient Hospital Stay
Admit: 2019-10-23 | Discharge: 2019-10-23 | Disposition: A | Discharge status: Home / Self Care | Payer: PRIVATE HEALTH INSURANCE | Attending: Emergency Medicine

## 2019-10-23 DIAGNOSIS — E1165 Type 2 diabetes mellitus with hyperglycemia: Principal | ICD-10-CM

## 2019-10-23 LAB — CBC WITH AUTO DIFFERENTIAL
Absolute Eos #: 0.07 10*3/uL (ref 0.0–0.4)
Absolute Immature Granulocyte: 0.07 10*3/uL (ref 0.00–0.30)
Absolute Lymph #: 1.78 10*3/uL (ref 1.0–4.8)
Absolute Mono #: 0.36 10*3/uL (ref 0.2–0.8)
Basophils Absolute: 0.07 10*3/uL (ref 0.0–0.2)
Basophils: 1 %
Eosinophils %: 1 % (ref 1–4)
Hematocrit: 38.3 % (ref 36.3–47.1)
Hemoglobin: 12.3 g/dL (ref 11.9–15.1)
Immature Granulocytes: 1 % — ABNORMAL HIGH
Lymphocytes: 25 % (ref 24–44)
MCH: 29.9 pg (ref 25.2–33.5)
MCHC: 32.1 g/dL (ref 28.4–34.8)
MCV: 93 fL (ref 82.6–102.9)
MPV: 9.7 fL (ref 8.1–13.5)
Monocytes: 5 % (ref 1–7)
NRBC Automated: 0 per 100 WBC
Platelets: 344 10*3/uL (ref 138–453)
RBC: 4.12 m/uL (ref 3.95–5.11)
RDW: 12.3 % (ref 11.8–14.4)
Seg Neutrophils: 67 % — ABNORMAL HIGH (ref 36–66)
Segs Absolute: 4.75 10*3/uL (ref 1.8–7.7)
WBC: 7.1 10*3/uL (ref 3.5–11.3)

## 2019-10-23 LAB — HEPATIC FUNCTION PANEL
ALT: 39 U/L — ABNORMAL HIGH (ref 5–33)
AST: 22 U/L (ref <32)
Albumin: 3.9 g/dL (ref 3.5–5.2)
Alkaline Phosphatase: 125 U/L — ABNORMAL HIGH (ref 35–104)
Bilirubin, Direct: <0.08 mg/dL (ref <0.31)
Total Bilirubin: 0.19 mg/dL — ABNORMAL LOW (ref 0.3–1.2)
Total Protein: 7 g/dL (ref 6.4–8.3)

## 2019-10-23 LAB — BASIC METABOLIC PANEL W/ REFLEX TO MG FOR LOW K
Anion Gap: 14 mmol/L (ref 9–17)
BUN: 17 mg/dL (ref 6–20)
Bun/Cre Ratio: 22 — ABNORMAL HIGH (ref 9–20)
CO2: 22 mmol/L (ref 20–31)
Calcium: 9.1 mg/dL (ref 8.6–10.4)
Chloride: 98 mmol/L (ref 98–107)
Creatinine: 0.76 mg/dL (ref 0.50–0.90)
GFR African American: >60 mL/min (ref >60)
GFR Non-African American: >60 mL/min (ref >60)
Glucose: 281 mg/dL — ABNORMAL HIGH (ref 70–99)
Potassium: 4.1 mmol/L (ref 3.7–5.3)
Sodium: 134 mmol/L — ABNORMAL LOW (ref 135–144)

## 2019-10-23 LAB — BETA-HYDROXYBUTYRATE: Beta-Hydroxybutyrate: 0.13 mmol/L (ref 0.02–0.27)

## 2019-10-23 LAB — BRAIN NATRIURETIC PEPTIDE: Pro-BNP: 135 pg/mL (ref <300)

## 2019-10-23 LAB — HCG, SERUM, QUALITATIVE: hCG Qual: NEGATIVE

## 2019-10-23 LAB — TROPONIN: Troponin, High Sensitivity: <6 ng/L (ref 0–14)

## 2019-10-23 LAB — OSMOLALITY: Serum Osmolality: 292 mOsm/kg (ref 282–298)

## 2019-10-23 LAB — LIPASE: Lipase: 40 U/L (ref 13–60)

## 2019-10-23 MED ORDER — SODIUM CHLORIDE 0.9 % IV BOLUS
0.9 % | Freq: Once | INTRAVENOUS | Status: AC
Start: 2019-10-23 — End: 2019-10-23
  Administered 2019-10-23: 15:00:00 1000 mL via INTRAVENOUS

## 2019-10-23 NOTE — ED Provider Notes (Signed)
EMERGENCY DEPARTMENT ENCOUNTER    Pt Name: Carolyn Crane  MRN: [id number]  Birthdate Mar 19, 1976  Date of evaluation: 10/23/19  CHIEF COMPLAINT       Chief Complaint   Patient presents with   ??? Chest Pain   ??? Hyperglycemia     HISTORY OF PRESENT ILLNESS   Patient is a 44 year old female here with multiple complaints.  She has been having some chest discomfort and right upper quadrant pain.  Denies any ripping or tearing pain to the back.  She states she has had her gallbladder out in the past.  Denies history of heart disease.  Denies any shortness of breath cough or fever.  She also states she has been having nausea vomiting diarrhea.  Denies hematemesis melena or hematochezia.  Denies any lower abdominal pain.  Also states she has been having some intermittent foot swelling bilaterally.  Denies history of blood clots recent surgeries or hemoptysis.  Also states she has been having elevated blood sugar as high as the 400s.  Denies history of DKA.    REVIEW OF SYSTEMS     Review of Systems   Constitutional: Positive for activity change, appetite change and fatigue. Negative for chills and fever.   HENT: Negative for trouble swallowing.    Eyes: Negative for visual disturbance.   Respiratory: Negative for shortness of breath.    Cardiovascular: Positive for chest pain and leg swelling.   Gastrointestinal: Positive for abdominal pain, diarrhea, nausea and vomiting.   Genitourinary: Negative for difficulty urinating.   Musculoskeletal: Negative for back pain and neck pain.   Skin: Negative for rash.   Neurological: Negative for weakness.   Psychiatric/Behavioral: Negative for confusion.     PASTMEDICAL HISTORY     Past Medical History:   Diagnosis Date   ??? Bipolar 1 disorder (HCC)    ??? Bipolar 1 disorder (HCC)    ??? Depression    ??? Diabetes mellitus (HCC)    ??? Fibromyalgia    ??? Hyperlipidemia    ??? IBS (irritable bowel syndrome)    ??? PCOS (polycystic ovarian syndrome)      SURGICAL HISTORY       Past Surgical History:    Procedure Laterality Date   ??? APPENDECTOMY     ??? CHOLECYSTECTOMY     ??? FOOT SURGERY Left     plantar fascitiis release   ??? SHOULDER SURGERY Left    ??? TONSILLECTOMY       CURRENT MEDICATIONS       Discharge Medication List as of 10/23/2019  1:26 PM      CONTINUE these medications which have NOT CHANGED    Details   aspirin 81 MG EC tablet Take 1 tablet by mouth daily, Disp-30 tablet, R-3Normal      SUMAtriptan (IMITREX) 50 MG tablet Take 1 tablet by mouth daily as needed for Migraine, Disp-18 tablet, R-1Normal      glimepiride (AMARYL) 1 MG tablet Take 1 mg by mouth every morning (before breakfast)Historical Med      famotidine (PEPCID) 20 MG tablet Take 20 mg by mouth 2 times dailyHistorical Med      LORazepam (ATIVAN) 0.5 MG tablet Take 0.5 mg by mouth 2 times daily as needed for Anxiety.Historical Med      dicyclomine (BENTYL) 10 MG capsule Take 1 capsule by mouth every 6 hours as needed (cramps), Disp-20 capsule,R-0Print      lansoprazole (PREVACID) 30 MG delayed release capsule Take 30 mg by mouth dailyHistorical Med  escitalopram (LEXAPRO) 10 MG tablet Take 10 mg by mouth daily Historical Med      Liraglutide (VICTOZA SC) Inject 1.8 Units into the skin daily Historical Med      atorvastatin (LIPITOR) 80 MG tablet Take 80 mg by mouth dailyHistorical Med      valACYclovir (VALTREX) 500 MG tablet Take 1,000 mg by mouth 2 times daily as needed Historical Med      fenofibrate 160 MG tablet Take 160 mg by mouth daily Historical Med      Insulin Degludec (TRESIBA FLEXTOUCH) 200 UNIT/ML SOPN Inject 100 Units into the skin every morning Historical Med      ARIPiprazole (ABILIFY) 15 MG tablet Take 30 mg by mouth daily Historical Med           ALLERGIES     is allergic to prochlorperazine, reglan [metoclopramide], and zofran [ondansetron hcl].  FAMILY HISTORY     She indicated that the status of her mother is unknown. She indicated that the status of her father is unknown. She indicated that the status of her  maternal grandmother is unknown. She indicated that the status of her maternal grandfather is unknown.     SOCIAL HISTORY       Social History     Tobacco Use   ??? Smoking status: Never Smoker   ??? Smokeless tobacco: Never Used   Vaping Use   ??? Vaping Use: Never used   Substance Use Topics   ??? Alcohol use: Yes     Comment: socially   ??? Drug use: No     PHYSICAL EXAM     INITIAL VITALS: BP 132/85    Pulse 77    Temp 98.1 ??F (36.7 ??C) (Oral)    Resp 14    Ht 5\' 5"  (1.651 m)    Wt 205 lb (93 kg)    LMP 10/13/2019    SpO2 99%    BMI 34.11 kg/m??    Physical Exam  Vitals and nursing note reviewed.   Constitutional:       General: She is not in acute distress.     Appearance: She is not ill-appearing, toxic-appearing or diaphoretic.   HENT:      Head: Normocephalic and atraumatic.      Mouth/Throat:      Mouth: Mucous membranes are moist.      Pharynx: Oropharynx is clear.   Eyes:      General: No scleral icterus.     Extraocular Movements: Extraocular movements intact.      Conjunctiva/sclera: Conjunctivae normal.      Pupils: Pupils are equal, round, and reactive to light.   Cardiovascular:      Rate and Rhythm: Normal rate and regular rhythm.      Pulses: Normal pulses.      Heart sounds: Normal heart sounds. No murmur heard.   No friction rub. No gallop.    Pulmonary:      Effort: Pulmonary effort is normal. No respiratory distress.      Breath sounds: Normal breath sounds.   Abdominal:      General: There is no distension.      Palpations: Abdomen is soft. There is no mass.      Tenderness: There is abdominal tenderness. There is no guarding or rebound.      Hernia: No hernia is present.   Musculoskeletal:         General: No deformity. Normal range of motion.      Cervical  back: Normal range of motion and neck supple. No rigidity.      Right lower leg: Edema present.      Left lower leg: Edema present.      Comments: Mild bilateral foot swelling, mild pretibial edema bilaterally, no calf tenderness or asymmetry    Skin:     General: Skin is warm and dry.      Capillary Refill: Capillary refill takes less than 2 seconds.      Findings: No rash.   Neurological:      General: No focal deficit present.      Mental Status: She is alert and oriented to person, place, and time.      Cranial Nerves: No cranial nerve deficit.   Psychiatric:         Thought Content: Thought content normal.         MEDICAL DECISION MAKING:          Please see ED Course below for MDM/ED course.    DDx: DKA, dehydration, viral illness, PE, ACS    All patient's question's and concerns were answered prior to disposition and patient and/or family expressed understanding and agreement of treatment plan.       NIH STROKE SCALE:            PROCEDURES:    Procedures    DIAGNOSTIC RESULTS   EKG:All EKG's are interpreted by the Emergency Department Physician who either signs or Co-signs this chart in the absence of a cardiologist.    Normal sinus rhythm rate of 70 normal intervals normal axis no ST elevations or depressions there is T wave inversion lead III, Q wave lead III, nonspecific EKG    RADIOLOGY:All plain film, CT, MRI, and formal ultrasound images (except ED bedside ultrasound) are read by the radiologist, see reports below, unless otherwisenoted in MDM or here.  XR CHEST 1 VIEW   Final Result   Negative chest.           LABS: All lab results were reviewed by myself, and all abnormals are listed below.  Labs Reviewed   CBC WITH AUTO DIFFERENTIAL - Abnormal; Notable for the following components:       Result Value    Seg Neutrophils 67 (*)     Immature Granulocytes 1 (*)     All other components within normal limits   HEPATIC FUNCTION PANEL - Abnormal; Notable for the following components:    Alkaline Phosphatase 125 (*)     ALT 39 (*)     Total Bilirubin 0.19 (*)     All other components within normal limits   BASIC METABOLIC PANEL W/ REFLEX TO MG FOR LOW K - Abnormal; Notable for the following components:    Glucose 281 (*)     Bun/Cre Ratio 22 (*)      Sodium 134 (*)     All other components within normal limits   LIPASE   BRAIN NATRIURETIC PEPTIDE   TROPONIN   HCG, SERUM, QUALITATIVE   BETA-HYDROXYBUTYRATE   OSMOLALITY       EMERGENCY DEPARTMENTCOURSE:     Patient is a 44 year old female here with chest pain, nausea vomiting diarrhea, elevated blood sugars and bilateral foot swelling.  She is in no distress she appears well she is 99% on room air afebrile nontoxic.  She has mild epigastric tenderness no rebound or guarding.  No significant heart history.  Plan for symptomatic treatment, she is declining any medications but will give IV fluids.  Plan to rule out DKA electrolyte imbalance or ACS.  She is PERC negative.    Labs and imaging reviewed.  Chest x-ray negative, no significant lab abnormality.  Updated patient on results.  Vitals are stable.  No evidence for ACS or DKA.  Possible mild viral illness and dehydration.  Recommend continued p.o. hydration, follow-up with PCP.  Strict return precautions given.    Vitals:    Vitals:    10/23/19 1145 10/23/19 1200 10/23/19 1215 10/23/19 1230   BP:       Pulse: 79 78 82 77   Resp: 16 19 21 14    Temp:       TempSrc:       SpO2: 98% 98% 97% 99%   Weight:       Height:           The patient was given the following medications while in the emergency department:  Orders Placed This Encounter   Medications   ??? 0.9 % sodium chloride bolus     CONSULTS:  None    FINAL IMPRESSION      1. Hyperglycemia    2. Chest pain, unspecified type    3. Nausea vomiting and diarrhea          DISPOSITION/PLAN   DISPOSITION Decision To Discharge 10/23/2019 01:25:42 PM      PATIENT REFERRED TO:  10/25/2019, APRN - CNP  [address], [address]  [place]  [phone]    Schedule an appointment as soon as possible for a visit       [facility] ED  [address] [place]  [phone]    If symptoms worsen    DISCHARGE MEDICATIONS:  Discharge Medication List as of 10/23/2019  1:26 PM        10/25/2019,  MD  Attending Emergency Physician    This note was created with the assistance of a speech-recognition program. While intending to generate a document that actually reflects the content of the visit, no guarantees can be provided that every mistake has been identified and corrected by editing.                    Arlan Organ, MD  10/23/19 1401

## 2019-10-23 NOTE — Discharge Instructions (Signed)
Follow-up with your doctor.  Stay hydrated.  Get lots of rest.  Return if you have any worsening symptoms.

## 2019-10-24 LAB — EKG 12-LEAD
Atrial Rate: 70 {beats}/min
P Axis: 29 degrees
P-R Interval: 158 ms
Q-T Interval: 392 ms
QRS Duration: 76 ms
QTc Calculation (Bazett): 423 ms
R Axis: 55 degrees
T Axis: 41 degrees
Ventricular Rate: 70 {beats}/min

## 2019-10-30 ENCOUNTER — Emergency Department: Admit: 2019-10-30 | Payer: PRIVATE HEALTH INSURANCE | Primary: Nurse Practitioner

## 2019-10-30 ENCOUNTER — Inpatient Hospital Stay
Admit: 2019-10-30 | Discharge: 2019-10-30 | Disposition: A | Discharge status: Home / Self Care | Payer: PRIVATE HEALTH INSURANCE | Attending: Emergency Medicine

## 2019-10-30 DIAGNOSIS — S93601A Unspecified sprain of right foot, initial encounter: Secondary | ICD-10-CM

## 2019-10-30 MED ORDER — FUROSEMIDE 20 MG PO TABS
20 MG | ORAL_TABLET | Freq: Every day | ORAL | 0 refills | Status: AC
Start: 2019-10-30 — End: ?

## 2019-10-30 MED ORDER — IBUPROFEN 800 MG PO TABS
800 MG | ORAL_TABLET | Freq: Four times a day (QID) | ORAL | 0 refills | Status: DC | PRN
Start: 2019-10-30 — End: 2019-12-10

## 2019-10-30 MED ORDER — ACETAMINOPHEN-CODEINE 300-30 MG PO TABS
300-30 MG | ORAL_TABLET | Freq: Four times a day (QID) | ORAL | 0 refills | Status: AC | PRN
Start: 2019-10-30 — End: 2019-11-02

## 2019-10-30 NOTE — ED Notes (Signed)
Discharge instructions and follow up care reviewed with patient and all questions answered at this time. Patient stable and ambulatory at time of discharge.      Sarita Bottom, RN  10/30/19 [phone]

## 2019-10-30 NOTE — ED Provider Notes (Signed)
eMERGENCY dEPARTMENT eNCOUnter   Independent Attestation     Pt Name: Carolyn Crane  MRN: [id number]  Birthdate 04-23-1976  Date of evaluation: 10/30/19     Carolyn Crane is a 44 y.o. female with CC: Foot Injury (right sided heel pain after stepping into a hole )    No fracture seen on plain films.    Based on the medical record the care appears appropriate.  I was personally available for consultation in the Emergency Department.    Karilyn Cota, DO  Attending Emergency Physician                    Steele Sizer, DO  Resident  10/30/19 [phone]

## 2019-10-30 NOTE — ED Provider Notes (Signed)
[facility] ED  eMERGENCY dEPARTMENT eNCOUnter      Pt Name: Carolyn Crane  MRN: [id number]  Birthdate [birth year]  Date of evaluation: 10/30/2019  Provider: Shatiqua Heroux MARIE Sabino Donovan NP, APRN - CNP    CHIEF COMPLAINT       Chief Complaint   Patient presents with   ??? Foot Injury     right sided heel pain after stepping into a hole          HISTORY OF PRESENT ILLNESS  (Location/Symptom, Timing/Onset, Context/Setting, Quality, Duration, Modifying Factors, Severity.)   Carolyn Crane is a 44 y.o. female who presents to the emergency department private vehicle for evaluation of right heel pain.  Patient states that she stepped in a hole and now he has pain to the right heel.  She rates the pain a 7 on a 0-to-10 scale.  The pain is worse with movement.  She is not sure and she has a heel spur that broke or if she sprained it.  Exacerbating factors are trying to walk and relieving factors none      Nursing Notes were reviewed.    ALLERGIES     Prochlorperazine, Reglan [metoclopramide], and Zofran [ondansetron hcl]    CURRENT MEDICATIONS       Discharge Medication List as of 10/30/2019  4:25 PM      CONTINUE these medications which have NOT CHANGED    Details   aspirin 81 MG EC tablet Take 1 tablet by mouth daily, Disp-30 tablet, R-3Normal      SUMAtriptan (IMITREX) 50 MG tablet Take 1 tablet by mouth daily as needed for Migraine, Disp-18 tablet, R-1Normal      glimepiride (AMARYL) 1 MG tablet Take 1 mg by mouth every morning (before breakfast)Historical Med      famotidine (PEPCID) 20 MG tablet Take 20 mg by mouth 2 times dailyHistorical Med      LORazepam (ATIVAN) 0.5 MG tablet Take 0.5 mg by mouth 2 times daily as needed for Anxiety.Historical Med      dicyclomine (BENTYL) 10 MG capsule Take 1 capsule by mouth every 6 hours as needed (cramps), Disp-20 capsule,R-0Print      lansoprazole (PREVACID) 30 MG delayed release capsule Take 30 mg by mouth dailyHistorical Med      escitalopram (LEXAPRO) 10 MG tablet Take 10 mg by  mouth daily Historical Med      Liraglutide (VICTOZA SC) Inject 1.8 Units into the skin daily Historical Med      atorvastatin (LIPITOR) 80 MG tablet Take 80 mg by mouth dailyHistorical Med      valACYclovir (VALTREX) 500 MG tablet Take 1,000 mg by mouth 2 times daily as needed Historical Med      fenofibrate 160 MG tablet Take 160 mg by mouth daily Historical Med      Insulin Degludec (TRESIBA FLEXTOUCH) 200 UNIT/ML SOPN Inject 100 Units into the skin every morning Historical Med      ARIPiprazole (ABILIFY) 15 MG tablet Take 30 mg by mouth daily Historical Med             PAST MEDICAL HISTORY         Diagnosis Date   ??? Bipolar 1 disorder (HCC)    ??? Bipolar 1 disorder (HCC)    ??? Depression    ??? Diabetes mellitus (HCC)    ??? Fibromyalgia    ??? Hyperlipidemia    ??? IBS (irritable bowel syndrome)    ??? PCOS (polycystic ovarian syndrome)  SURGICAL HISTORY           Procedure Laterality Date   ??? APPENDECTOMY     ??? CHOLECYSTECTOMY     ??? FOOT SURGERY Left     plantar fascitiis release   ??? SHOULDER SURGERY Left    ??? TONSILLECTOMY           FAMILY HISTORY           Problem Relation Age of Onset   ??? Cancer Mother    ??? Stroke Mother    ??? Stroke Father    ??? Cancer Maternal Grandmother    ??? Stroke Maternal Grandmother    ??? Cancer Maternal Grandfather      Family Status   Relation Name Status   ??? Mother  (Not Specified)   ??? Father  (Not Specified)   ??? MGM  (Not Specified)   ??? MGF  (Not Specified)        SOCIAL HISTORY      reports that she has never smoked. She has never used smokeless tobacco. She reports current alcohol use. She reports that she does not use drugs.    REVIEW OF SYSTEMS    (2-9 systems for level 4, 10 or more for level 5)     Review of Systems   Constitutional: Negative for chills, fever and unexpected weight change.   HENT: Negative for congestion, rhinorrhea, sinus pressure and sore throat.    Respiratory: Negative for cough, shortness of breath and wheezing.    Cardiovascular: Negative for chest pain and  palpitations.   Gastrointestinal: Negative for abdominal pain, constipation, diarrhea, nausea and vomiting.   Genitourinary: Negative for dysuria and hematuria.   Musculoskeletal: Negative for arthralgias and myalgias.   Skin: Negative for color change and rash.   Neurological: Negative for dizziness, weakness and headaches.   Hematological: Negative for adenopathy.   All other systems reviewed and are negative.       Except as noted above the remainder of the review of systems was reviewed and negative.     PHYSICAL EXAM    (up to 7 for level 4, 8 or more for level 5)     ED Triage Vitals [10/30/19 1543]   BP Temp Temp Source Pulse Resp SpO2 Height Weight   133/88 97.7 ??F (36.5 ??C) Oral 80 17 98 % 5\' 5"  (1.651 m) 210 lb (95.3 kg)       Physical Exam  Vitals reviewed.   Constitutional:       Appearance: She is well-developed.   HENT:      Head: Normocephalic and atraumatic.   Eyes:      Conjunctiva/sclera: Conjunctivae normal.      Pupils: Pupils are equal, round, and reactive to light.   Cardiovascular:      Rate and Rhythm: Normal rate and regular rhythm.   Pulmonary:      Effort: Pulmonary effort is normal. No respiratory distress.      Breath sounds: Normal breath sounds. No stridor.   Abdominal:      General: Bowel sounds are normal.      Palpations: Abdomen is soft.   Musculoskeletal:         General: Normal range of motion.      Cervical back: Normal range of motion and neck supple.      Right lower leg: Swelling present.      Left lower leg: Swelling present.      Left foot: Tenderness present.   Lymphadenopathy:  Cervical: No cervical adenopathy.   Skin:     General: Skin is warm and dry.      Findings: No rash.   Neurological:      Mental Status: She is alert and oriented to person, place, and time.       RADIOLOGY:   Non-plain film images such as CT, Ultrasound and MRI are read by the radiologist. Plain radiographic images are visualized and preliminarily interpreted by the emergency physician with  the below findings:    XR ANKLE LEFT (MIN 3 VIEWS)    Result Date: 10/30/2019  EXAMINATION: THREE XRAY VIEWS OF THE RIGHT FOOT; THREE XRAY VIEWS OF THE LEFT FOOT; THREE XRAY VIEWS OF THE LEFT ANKLE; THREE XRAY VIEWS OF THE RIGHT ANKLE 10/30/2019 4:01 pm COMPARISON: None. HISTORY: ORDERING SYSTEM PROVIDED HISTORY: ATTENTION HEEL TECHNOLOGIST PROVIDED HISTORY: ATTENTION HEEL Reason for Exam: fell Acuity: Acute Type of Exam: Initial Relevant Medical/Surgical History: pt fell in a hole yesterday, pain in both lt and rt feet/ankles, more pain in rt heel FINDINGS: Right foot: Mild soft tissue swelling over the forefoot is noted without evidence of acute fracture or dislocation.  Small plantar calcaneal spur and Haglund's deformities are noted.  LisFranc relationship is preserved. Left foot: Mild soft tissue swelling over the forefoot is present.  No acute fracture or dislocation is noted.  Small plantar calcaneal spur.  Small Haglund's deformity.  LisFranc relationship is preserved. Left ankle: Mild soft tissue swelling is noted more predominant long the medial aspect of the ankle.  No acute fracture or dislocation is noted. Ankle mortise is uniform.  Talar dome and talar walls are intact. Right ankle: Diffuse mild soft tissue swelling is noted, more significant along the medial aspect without acute fracture or dislocation.  Ankle mortise is uniform.  Talar dome and talar walls are intact.     Right foot: Soft tissue swelling over the forefoot.  No acute osseous abnormality.  Plantar calcaneal spur and Haglund's deformities noted. Left foot: Soft tissue swelling over the forefoot.  No acute osseous abnormality.  Plantar calcaneal spur and Haglund's deformities noted. Left ankle: Soft tissue swelling more significant along the medial aspect without acute osseous abnormality. Right ankle: Soft tissue swelling over the ankle more significant medial aspect.  No acute osseous abnormality.     XR ANKLE RIGHT (MIN 3 VIEWS)    Result  Date: 10/30/2019  EXAMINATION: THREE XRAY VIEWS OF THE RIGHT FOOT; THREE XRAY VIEWS OF THE LEFT FOOT; THREE XRAY VIEWS OF THE LEFT ANKLE; THREE XRAY VIEWS OF THE RIGHT ANKLE 10/30/2019 4:01 pm COMPARISON: None. HISTORY: ORDERING SYSTEM PROVIDED HISTORY: ATTENTION HEEL TECHNOLOGIST PROVIDED HISTORY: ATTENTION HEEL Reason for Exam: fell Acuity: Acute Type of Exam: Initial Relevant Medical/Surgical History: pt fell in a hole yesterday, pain in both lt and rt feet/ankles, more pain in rt heel FINDINGS: Right foot: Mild soft tissue swelling over the forefoot is noted without evidence of acute fracture or dislocation.  Small plantar calcaneal spur and Haglund's deformities are noted.  LisFranc relationship is preserved. Left foot: Mild soft tissue swelling over the forefoot is present.  No acute fracture or dislocation is noted.  Small plantar calcaneal spur.  Small Haglund's deformity.  LisFranc relationship is preserved. Left ankle: Mild soft tissue swelling is noted more predominant long the medial aspect of the ankle.  No acute fracture or dislocation is noted. Ankle mortise is uniform.  Talar dome and talar walls are intact. Right ankle: Diffuse mild soft tissue swelling is  noted, more significant along the medial aspect without acute fracture or dislocation.  Ankle mortise is uniform.  Talar dome and talar walls are intact.     Right foot: Soft tissue swelling over the forefoot.  No acute osseous abnormality.  Plantar calcaneal spur and Haglund's deformities noted. Left foot: Soft tissue swelling over the forefoot.  No acute osseous abnormality.  Plantar calcaneal spur and Haglund's deformities noted. Left ankle: Soft tissue swelling more significant along the medial aspect without acute osseous abnormality. Right ankle: Soft tissue swelling over the ankle more significant medial aspect.  No acute osseous abnormality.     XR FOOT LEFT (MIN 3 VIEWS)    Result Date: 10/30/2019  EXAMINATION: THREE XRAY VIEWS OF THE RIGHT  FOOT; THREE XRAY VIEWS OF THE LEFT FOOT; THREE XRAY VIEWS OF THE LEFT ANKLE; THREE XRAY VIEWS OF THE RIGHT ANKLE 10/30/2019 4:01 pm COMPARISON: None. HISTORY: ORDERING SYSTEM PROVIDED HISTORY: ATTENTION HEEL TECHNOLOGIST PROVIDED HISTORY: ATTENTION HEEL Reason for Exam: fell Acuity: Acute Type of Exam: Initial Relevant Medical/Surgical History: pt fell in a hole yesterday, pain in both lt and rt feet/ankles, more pain in rt heel FINDINGS: Right foot: Mild soft tissue swelling over the forefoot is noted without evidence of acute fracture or dislocation.  Small plantar calcaneal spur and Haglund's deformities are noted.  LisFranc relationship is preserved. Left foot: Mild soft tissue swelling over the forefoot is present.  No acute fracture or dislocation is noted.  Small plantar calcaneal spur.  Small Haglund's deformity.  LisFranc relationship is preserved. Left ankle: Mild soft tissue swelling is noted more predominant long the medial aspect of the ankle.  No acute fracture or dislocation is noted. Ankle mortise is uniform.  Talar dome and talar walls are intact. Right ankle: Diffuse mild soft tissue swelling is noted, more significant along the medial aspect without acute fracture or dislocation.  Ankle mortise is uniform.  Talar dome and talar walls are intact.     Right foot: Soft tissue swelling over the forefoot.  No acute osseous abnormality.  Plantar calcaneal spur and Haglund's deformities noted. Left foot: Soft tissue swelling over the forefoot.  No acute osseous abnormality.  Plantar calcaneal spur and Haglund's deformities noted. Left ankle: Soft tissue swelling more significant along the medial aspect without acute osseous abnormality. Right ankle: Soft tissue swelling over the ankle more significant medial aspect.  No acute osseous abnormality.     XR FOOT RIGHT (MIN 3 VIEWS)    Result Date: 10/30/2019  EXAMINATION: THREE XRAY VIEWS OF THE RIGHT FOOT; THREE XRAY VIEWS OF THE LEFT FOOT; THREE XRAY VIEWS OF  THE LEFT ANKLE; THREE XRAY VIEWS OF THE RIGHT ANKLE 10/30/2019 4:01 pm COMPARISON: None. HISTORY: ORDERING SYSTEM PROVIDED HISTORY: ATTENTION HEEL TECHNOLOGIST PROVIDED HISTORY: ATTENTION HEEL Reason for Exam: fell Acuity: Acute Type of Exam: Initial Relevant Medical/Surgical History: pt fell in a hole yesterday, pain in both lt and rt feet/ankles, more pain in rt heel FINDINGS: Right foot: Mild soft tissue swelling over the forefoot is noted without evidence of acute fracture or dislocation.  Small plantar calcaneal spur and Haglund's deformities are noted.  LisFranc relationship is preserved. Left foot: Mild soft tissue swelling over the forefoot is present.  No acute fracture or dislocation is noted.  Small plantar calcaneal spur.  Small Haglund's deformity.  LisFranc relationship is preserved. Left ankle: Mild soft tissue swelling is noted more predominant long the medial aspect of the ankle.  No acute fracture or dislocation is noted.  Ankle mortise is uniform.  Talar dome and talar walls are intact. Right ankle: Diffuse mild soft tissue swelling is noted, more significant along the medial aspect without acute fracture or dislocation.  Ankle mortise is uniform.  Talar dome and talar walls are intact.     Right foot: Soft tissue swelling over the forefoot.  No acute osseous abnormality.  Plantar calcaneal spur and Haglund's deformities noted. Left foot: Soft tissue swelling over the forefoot.  No acute osseous abnormality.  Plantar calcaneal spur and Haglund's deformities noted. Left ankle: Soft tissue swelling more significant along the medial aspect without acute osseous abnormality. Right ankle: Soft tissue swelling over the ankle more significant medial aspect.  No acute osseous abnormality.     XR CHEST PORTABLE    Result Date: 10/30/2019  EXAMINATION: ONE XRAY VIEW OF THE CHEST 10/30/2019 4:01 pm COMPARISON: 10/23/2019 HISTORY: ORDERING SYSTEM PROVIDED HISTORY: Chest Pain TECHNOLOGIST PROVIDED HISTORY: Chest Pain  Reason for Exam: fell Acuity: Acute Type of Exam: Initial Relevant Medical/Surgical History: pt fell in a hole yesterday, pain in both lt and rt feet/ankles, more pain in rt heel FINDINGS: The cardiomediastinal silhouette is within normal limits. There is no consolidation, pneumothorax or evidence for edema. No evidence for effusion. No acute osseous abnormality is identified.     No acute airspace disease identified.     XR CHEST 1 VIEW    Result Date: 10/23/2019  EXAMINATION: ONE XRAY VIEW OF THE CHEST 10/23/2019 11:48 am COMPARISON: 07/06/2019 HISTORY: ORDERING SYSTEM PROVIDED HISTORY: chest pain Acuity: Acute Type of Exam: Initial FINDINGS: The lungs are without acute focal process.  No effusion or pneumothorax. The cardiomediastinal silhouette is normal.  The osseous structures are intact without acute process.     Negative chest.     Interpretation per the Radiologist below, if available at the time of this note:    XR FOOT LEFT (MIN 3 VIEWS)   Final Result   Right foot: Soft tissue swelling over the forefoot.  No acute osseous   abnormality.  Plantar calcaneal spur and Haglund's deformities noted.      Left foot: Soft tissue swelling over the forefoot.  No acute osseous   abnormality.  Plantar calcaneal spur and Haglund's deformities noted.      Left ankle: Soft tissue swelling more significant along the medial aspect   without acute osseous abnormality.      Right ankle: Soft tissue swelling over the ankle more significant medial   aspect.  No acute osseous abnormality.         XR ANKLE LEFT (MIN 3 VIEWS)   Final Result   Right foot: Soft tissue swelling over the forefoot.  No acute osseous   abnormality.  Plantar calcaneal spur and Haglund's deformities noted.      Left foot: Soft tissue swelling over the forefoot.  No acute osseous   abnormality.  Plantar calcaneal spur and Haglund's deformities noted.      Left ankle: Soft tissue swelling more significant along the medial aspect   without acute osseous  abnormality.      Right ankle: Soft tissue swelling over the ankle more significant medial   aspect.  No acute osseous abnormality.         XR FOOT RIGHT (MIN 3 VIEWS)   Final Result   Right foot: Soft tissue swelling over the forefoot.  No acute osseous   abnormality.  Plantar calcaneal spur and Haglund's deformities noted.      Left foot: Soft  tissue swelling over the forefoot.  No acute osseous   abnormality.  Plantar calcaneal spur and Haglund's deformities noted.      Left ankle: Soft tissue swelling more significant along the medial aspect   without acute osseous abnormality.      Right ankle: Soft tissue swelling over the ankle more significant medial   aspect.  No acute osseous abnormality.         XR ANKLE RIGHT (MIN 3 VIEWS)   Final Result   Right foot: Soft tissue swelling over the forefoot.  No acute osseous   abnormality.  Plantar calcaneal spur and Haglund's deformities noted.      Left foot: Soft tissue swelling over the forefoot.  No acute osseous   abnormality.  Plantar calcaneal spur and Haglund's deformities noted.      Left ankle: Soft tissue swelling more significant along the medial aspect   without acute osseous abnormality.      Right ankle: Soft tissue swelling over the ankle more significant medial   aspect.  No acute osseous abnormality.         XR CHEST PORTABLE   Final Result   No acute airspace disease identified.                 LABS:  Labs Reviewed - No data to display    All other labs were within normal range or not returned as of this dictation.    EMERGENCY DEPARTMENT COURSE and DIFFERENTIAL DIAGNOSIS/MDM:   Vitals:    Vitals:    10/30/19 1543   BP: 133/88   Pulse: 80   Resp: 17   Temp: 97.7 ??F (36.5 ??C)   TempSrc: Oral   SpO2: 98%   Weight: 210 lb (95.3 kg)   Height:  (1.651 m)       Medical Decision Making: the patient will be placed on a short course of lasix. She was told to follow up with podiatry.     FINAL IMPRESSION      1. Sprain of right foot, initial encounter     2. Peripheral edema          DISPOSITION/PLAN   DISPOSITION Decision To Discharge 10/30/2019 04:24:18 PM      PATIENT REFERRED TO:   Stoney Bang, APRN - CNP  [address], [address]  [place] [address]  [phone]    Schedule an appointment as soon as possible for a visit       Ann Held, DPM  [address]  [address]  [place] [address]  [phone]            DISCHARGE MEDICATIONS:     Discharge Medication List as of 10/30/2019  4:25 PM      START taking these medications    Details   furosemide (LASIX) 20 MG tablet Take 1 tablet by mouth daily, Disp-5 tablet, R-0Print      acetaminophen-codeine (TYLENOL/CODEINE #3) 300-30 MG per tablet Take 1 tablet by mouth every 6 hours as needed for Pain for up to 3 days., Disp-12 tablet, R-0Print      ibuprofen (IBU) 800 MG tablet Take 1 tablet by mouth every 6 hours as needed for Pain, Disp-21 tablet, R-0Print                 (Please note that portions of this note were completed with a voice recognition program.  Efforts were made to edit the dictations but occasionally words are mis-transcribed.)  Rachael Ferrie MARIE Sabino Donovan NP, APRN - CNP  Certified Nurse Practitioner         Cecille Rubin, APRN - CNP  11/02/19 [phone]

## 2019-11-15 ENCOUNTER — Emergency Department: Admit: 2019-11-16 | Payer: PRIVATE HEALTH INSURANCE | Primary: Nurse Practitioner

## 2019-11-15 ENCOUNTER — Inpatient Hospital Stay
Admit: 2019-11-15 | Discharge: 2019-11-16 | Disposition: A | Discharge status: Home / Self Care | Payer: PRIVATE HEALTH INSURANCE | Attending: Emergency Medicine

## 2019-11-15 DIAGNOSIS — M25552 Pain in left hip: Secondary | ICD-10-CM

## 2019-11-15 LAB — TROPONIN: Troponin, High Sensitivity: <6 ng/L (ref 0–14)

## 2019-11-15 LAB — CBC WITH AUTO DIFFERENTIAL
Absolute Eos #: 0.1 10*3/uL (ref 0.00–0.44)
Absolute Immature Granulocyte: 0.03 10*3/uL (ref 0.00–0.30)
Absolute Lymph #: 1.83 10*3/uL (ref 1.10–3.70)
Absolute Mono #: 0.54 10*3/uL (ref 0.10–1.20)
Basophils Absolute: 0.05 10*3/uL (ref 0.00–0.20)
Basophils: 1 % (ref 0–2)
Eosinophils %: 1 % (ref 1–4)
Hematocrit: 38.1 % (ref 36.3–47.1)
Hemoglobin: 12.8 g/dL (ref 11.9–15.1)
Immature Granulocytes: 0 %
Lymphocytes: 24 % (ref 24–43)
MCH: 30.8 pg (ref 25.2–33.5)
MCHC: 33.6 g/dL (ref 28.4–34.8)
MCV: 91.8 fL (ref 82.6–102.9)
MPV: 10.4 fL (ref 8.1–13.5)
Monocytes: 7 % (ref 3–12)
NRBC Automated: 0 per 100 WBC
Platelets: 366 10*3/uL (ref 138–453)
RBC: 4.15 m/uL (ref 3.95–5.11)
RDW: 12.5 % (ref 11.8–14.4)
Seg Neutrophils: 67 % — ABNORMAL HIGH (ref 36–65)
Segs Absolute: 4.96 10*3/uL (ref 1.50–8.10)
WBC: 7.5 10*3/uL (ref 3.5–11.3)

## 2019-11-15 LAB — BASIC METABOLIC PANEL
Anion Gap: 12 mmol/L (ref 9–17)
BUN: 12 mg/dL (ref 6–20)
Bun/Cre Ratio: 22 — ABNORMAL HIGH (ref 9–20)
CO2: 22 mmol/L (ref 20–31)
Calcium: 9 mg/dL (ref 8.6–10.4)
Chloride: 101 mmol/L (ref 98–107)
Creatinine: 0.54 mg/dL (ref 0.50–0.90)
GFR African American: >60 mL/min (ref >60)
GFR Non-African American: >60 mL/min (ref >60)
Glucose: 98 mg/dL (ref 70–99)
Potassium: 3.5 mmol/L — ABNORMAL LOW (ref 3.7–5.3)
Sodium: 135 mmol/L (ref 135–144)

## 2019-11-15 LAB — SPECIMEN REJECTION: Reason for Rejection: UNDETERMINED

## 2019-11-15 LAB — HCG, SERUM, QUALITATIVE: hCG Qual: NEGATIVE

## 2019-11-15 NOTE — ED Provider Notes (Signed)
EMERGENCY DEPARTMENT ENCOUNTER    Pt Name: Carolyn Crane  MRN: [id number]  Birthdate [birth year]  Date of evaluation: 11/15/19  CHIEF COMPLAINT       Chief Complaint   Patient presents with   ??? Groin Pain     left groin pain, warm to touch, started today     HISTORY OF PRESENT ILLNESS   This is a 44 year old female that presents with complaints of pain and discomfort in the left groin as well as some mild shortness of breath.  The patient has no previous history of DVT or pulmonary embolism, she does not take any birth control, she states that she has had no trauma or injury, she has some increasing discomfort in the groin.  There is a small area of swelling, she denies fevers or chills, but she does have some mild dyspnea.  She denies cough or sputum production, no hemoptysis.              REVIEW OF SYSTEMS     Review of Systems   Constitutional: Negative for chills and fever.   HENT: Negative for rhinorrhea and sore throat.    Eyes: Negative for discharge, redness and visual disturbance.   Respiratory: Positive for shortness of breath. Negative for cough.    Cardiovascular: Positive for leg swelling. Negative for chest pain and palpitations.   Gastrointestinal: Negative for diarrhea, nausea and vomiting.   Musculoskeletal: Negative for arthralgias, myalgias and neck pain.   Skin: Negative for color change and rash.   Neurological: Negative for seizures, weakness and headaches.   Psychiatric/Behavioral: Negative for hallucinations, self-injury and suicidal ideas.     PASTMEDICAL HISTORY     Past Medical History:   Diagnosis Date   ??? Bipolar 1 disorder (HCC)    ??? Bipolar 1 disorder (HCC)    ??? Depression    ??? Diabetes mellitus (HCC)    ??? Fibromyalgia    ??? Hyperlipidemia    ??? IBS (irritable bowel syndrome)    ??? PCOS (polycystic ovarian syndrome)      Past Problem List  Patient Active Problem List   Diagnosis Code   ??? Chest pain R07.9   ??? 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 Dyslipidemia E78.5   ??? Paresthesia  R20.2   ??? Diabetes mellitus (HCC) E11.9   ??? Depression F32.9   ??? Bipolar 1 disorder (HCC) F31.9   ??? Fibromyalgia M79.7     SURGICAL HISTORY       Past Surgical History:   Procedure Laterality Date   ??? APPENDECTOMY     ??? CHOLECYSTECTOMY     ??? FOOT SURGERY Left     plantar fascitiis release   ??? SHOULDER SURGERY Left    ??? TONSILLECTOMY       CURRENT MEDICATIONS       Current Discharge Medication List      CONTINUE these medications which have NOT CHANGED    Details   furosemide (LASIX) 20 MG tablet Take 1 tablet by mouth daily  Qty: 5 tablet, Refills: 0      ibuprofen (IBU) 800 MG tablet Take 1 tablet by mouth every 6 hours as needed for Pain  Qty: 21 tablet, Refills: 0      aspirin 81 MG EC tablet Take 1 tablet by mouth daily  Qty: 30 tablet, Refills: 3      SUMAtriptan (IMITREX) 50 MG tablet Take 1 tablet by mouth daily as needed for Migraine  Qty:  18 tablet, Refills: 1      glimepiride (AMARYL) 1 MG tablet Take 1 mg by mouth every morning (before breakfast)      famotidine (PEPCID) 20 MG tablet Take 20 mg by mouth 2 times daily      LORazepam (ATIVAN) 0.5 MG tablet Take 0.5 mg by mouth 2 times daily as needed for Anxiety.      dicyclomine (BENTYL) 10 MG capsule Take 1 capsule by mouth every 6 hours as needed (cramps)  Qty: 20 capsule, Refills: 0      lansoprazole (PREVACID) 30 MG delayed release capsule Take 30 mg by mouth daily      escitalopram (LEXAPRO) 10 MG tablet Take 10 mg by mouth daily       Liraglutide (VICTOZA SC) Inject 1.8 Units into the skin daily       atorvastatin (LIPITOR) 80 MG tablet Take 80 mg by mouth daily      valACYclovir (VALTREX) 500 MG tablet Take 1,000 mg by mouth 2 times daily as needed       fenofibrate 160 MG tablet Take 160 mg by mouth daily       Insulin Degludec (TRESIBA FLEXTOUCH) 200 UNIT/ML SOPN Inject 100 Units into the skin every morning       ARIPiprazole (ABILIFY) 15 MG tablet Take 30 mg by mouth daily            ALLERGIES     is allergic to prochlorperazine, reglan  [metoclopramide], and zofran [ondansetron hcl].  FAMILY HISTORY     She indicated that the status of her mother is unknown. She indicated that the status of her father is unknown. She indicated that the status of her maternal grandmother is unknown. She indicated that the status of her maternal grandfather is unknown.     SOCIAL HISTORY       Social History     Tobacco Use   ??? Smoking status: Never Smoker   ??? Smokeless tobacco: Never Used   Vaping Use   ??? Vaping Use: Never used   Substance Use Topics   ??? Alcohol use: Not Currently     Comment: socially   ??? Drug use: No     PHYSICAL EXAM     INITIAL VITALS: BP 110/60    Pulse 78    Temp 98 ??F (36.7 ??C) (Oral)    Resp 18    Ht 5' 5.5" (1.664 m)    Wt 217 lb 1.6 oz (98.5 kg)    LMP 11/15/2019    SpO2 97%    BMI 35.58 kg/m??    Physical Exam  Constitutional:       Appearance: Normal appearance. She is well-developed. She is not ill-appearing or toxic-appearing.   HENT:      Head: Normocephalic and atraumatic.   Eyes:      Conjunctiva/sclera: Conjunctivae normal.      Pupils: Pupils are equal, round, and reactive to light.   Neck:      Trachea: Trachea normal.   Cardiovascular:      Rate and Rhythm: Normal rate and regular rhythm.      Heart sounds: S1 normal and S2 normal. No murmur heard.     Pulmonary:      Effort: Pulmonary effort is normal. No accessory muscle usage or respiratory distress.      Breath sounds: Normal breath sounds.   Chest:      Chest wall: No deformity or tenderness.   Abdominal:  General: Bowel sounds are normal. There is no distension or abdominal bruit.      Palpations: Abdomen is not rigid.      Tenderness: There is no abdominal tenderness. There is no guarding or rebound.   Musculoskeletal:      Cervical back: Normal range of motion and neck supple.        Legs:    Skin:     General: Skin is warm.      Findings: No rash.   Neurological:      Mental Status: She is alert and oriented to person, place, and time.      GCS: GCS eye subscore is  4. GCS verbal subscore is 5. GCS motor subscore is 6.   Psychiatric:         Speech: Speech normal.         MEDICAL DECISION MAKING:   44 year old female, left leg pain, the patient is low risk for DVT and pulmonary embolism, she is pulmonary embolism rule out criteria negative, we will obtain a Doppler ultrasound, chest x-ray basic labs troponins and reevaluate.    9:35 PM EDT  Patient's ultrasound negative for acute pathology, troponins x2 -, chest x-ray clear.  Patient is not hypoxemic, she is not tachypneic, at this time there is nothing to suggest a dangerous cause of her dyspnea.  The swelling in her leg, there is no evidence of cellulitis, there was a few tender areas the, this could be just musculoskeletal pain.  Nothing to suggest an infectious etiology.         CRITICAL CARE:       PROCEDURES:    Procedures    DIAGNOSTIC RESULTS   EKG:All EKG's are interpreted by the Emergency Department Physician who either signs or Co-signs this chart in the absence of a cardiologist.    Patient's EKG shows sinus rhythm rate of 73, PR QRS QTc interval is unremarkable patient has normal axis no ST elevations or depressions, no significant T wave changes.  Nonspecific EKG.    RADIOLOGY:All plain film, CT, MRI, and formal ultrasound images (except ED bedside ultrasound) are read by the radiologist, see reports below, unless otherwisenoted in MDM or here.  XR CHEST PORTABLE   Final Result   Negative portable chest.         VL DUP LOWER EXTREMITY VENOUS LEFT    (Results Pending)     LABS: All lab results were reviewed by myself, and all abnormals are listed below.  Labs Reviewed   CBC WITH AUTO DIFFERENTIAL - Abnormal; Notable for the following components:       Result Value    Seg Neutrophils 67 (*)     All other components within normal limits   BASIC METABOLIC PANEL - Abnormal; Notable for the following components:    Bun/Cre Ratio 22 (*)     Potassium 3.5 (*)     All other components within normal limits   TROPONIN   HCG,  SERUM, QUALITATIVE   SPECIMEN REJECTION   TROPONIN       EMERGENCY DEPARTMENTCOURSE:         Vitals:    Vitals:    11/15/19 1811 11/15/19 1915 11/15/19 2015   BP: 119/80 103/60 110/60   Pulse: 86 72 78   Resp: 16 16 18    Temp: 98 ??F (36.7 ??C)     TempSrc: Oral     SpO2: 98% 97% 97%   Weight: 217 lb 1.6 oz (98.5 kg)  Height: 5' 5.5" (1.664 m)         The patient was given the following medications while in the emergency department:  No orders of the defined types were placed in this encounter.    CONSULTS:  None    FINAL IMPRESSION      1. Pain in joint involving left pelvic region and thigh    2. Dyspnea and respiratory abnormalities          DISPOSITION/PLAN   DISPOSITION Decision To Discharge 11/15/2019 09:34:09 PM      PATIENT REFERRED TO:  Stoney Bang, APRN - CNP  [address]  [place] [address]  [phone]    Schedule an appointment as soon as possible for a visit in 2 days      DISCHARGE MEDICATIONS:  Current Discharge Medication List        Wyvonne Lenz, MD  Attending Emergency Physician                   Wyvonne Lenz, MD  11/15/19 2135

## 2019-11-15 NOTE — ED Notes (Signed)
Pt presents to the ED with c/o left groin pain and tenderness x 1 day. Pt states she woke up this morning with a pain in her groin and is concerned she may have a blood clot. Pt is concerned given her current health factors. Upon assessment, no redness or swelling noted. Pt reports 4/10 pain in that area. Pt also expressed concern that she has been having increased SOB x several days but states she also has a hx of anxiety. Pt reports minimal chest pain at the time the SOB occurs. Pt is A/O x4; respirations are even and unlabored. Lung sounds clear on ausculation; heart tones WNL.      Elvia Aydin, RN  11/15/19 1839

## 2019-11-15 NOTE — ED Notes (Signed)
House Supervisor called @ [phone] to notify of doppler orders - unable to find anyone in department.     Carolyn Crane  11/15/19 1830

## 2019-11-15 NOTE — ED Notes (Signed)
Discharge instructions and follow up care reviewed with patient and all questions answered at this time. Patient stable and ambulatory at time of discharge.      Sarita Bottom, RN  11/15/19 2144

## 2019-11-16 LAB — TROPONIN: Troponin, High Sensitivity: <6 ng/L (ref 0–14)

## 2019-11-16 LAB — EKG 12-LEAD
Atrial Rate: 73 {beats}/min
P Axis: 53 degrees
P-R Interval: 164 ms
Q-T Interval: 386 ms
QRS Duration: 90 ms
QTc Calculation (Bazett): 425 ms
R Axis: 68 degrees
T Axis: 47 degrees
Ventricular Rate: 73 {beats}/min

## 2019-12-02 ENCOUNTER — Inpatient Hospital Stay
Admit: 2019-12-02 | Discharge: 2019-12-02 | Disposition: A | Discharge status: Home / Self Care | Payer: PRIVATE HEALTH INSURANCE | Attending: Emergency Medicine

## 2019-12-02 ENCOUNTER — Emergency Department: Admit: 2019-12-02 | Payer: PRIVATE HEALTH INSURANCE | Primary: Nurse Practitioner

## 2019-12-02 DIAGNOSIS — E119 Type 2 diabetes mellitus without complications: Principal | ICD-10-CM

## 2019-12-02 LAB — CBC WITH AUTO DIFFERENTIAL
Absolute Eos #: 0.08 10*3/uL (ref 0.0–0.4)
Absolute Immature Granulocyte: 0.08 10*3/uL (ref 0.00–0.30)
Absolute Lymph #: 1.86 10*3/uL (ref 1.0–4.8)
Absolute Mono #: 0.49 10*3/uL (ref 0.2–0.8)
Basophils Absolute: 0 10*3/uL (ref 0.0–0.2)
Basophils: 0 %
Eosinophils %: 1 % (ref 1–4)
Hematocrit: 42.5 % (ref 36.3–47.1)
Hemoglobin: 13.7 g/dL (ref 11.9–15.1)
Immature Granulocytes: 1 % — ABNORMAL HIGH
Lymphocytes: 23 % — ABNORMAL LOW (ref 24–44)
MCH: 30.5 pg (ref 25.2–33.5)
MCHC: 32.2 g/dL (ref 28.4–34.8)
MCV: 94.7 fL (ref 82.6–102.9)
MPV: 9.6 fL (ref 8.1–13.5)
Monocytes: 6 % (ref 1–7)
Platelets: 350 10*3/uL (ref 138–453)
RBC: 4.49 m/uL (ref 3.95–5.11)
RDW: 12.3 % (ref 11.8–14.4)
Seg Neutrophils: 69 % — ABNORMAL HIGH (ref 36–66)
Segs Absolute: 5.59 10*3/uL (ref 1.8–7.7)
WBC: 8.1 10*3/uL (ref 3.5–11.3)

## 2019-12-02 LAB — URINALYSIS WITH MICROSCOPIC
Bilirubin Urine: NEGATIVE
Epithelial Cells UA: 10 /HPF (ref 0–5)
Glucose, Ur: NEGATIVE
Ketones, Urine: NEGATIVE
Leukocyte Esterase, Urine: NEGATIVE
Nitrite, Urine: NEGATIVE
Protein, UA: NEGATIVE
RBC, UA: 0 /HPF (ref 0–2)
Specific Gravity, UA: 1.038 — ABNORMAL HIGH (ref 1.005–1.030)
Urine Hgb: NEGATIVE
Urobilinogen, Urine: NORMAL
WBC, UA: 2 /HPF (ref 0–5)
pH, UA: 5.5 (ref 5.0–8.0)

## 2019-12-02 LAB — EKG 12-LEAD
Atrial Rate: 74 {beats}/min
P Axis: 22 degrees
P-R Interval: 164 ms
Q-T Interval: 404 ms
QRS Duration: 96 ms
QTc Calculation (Bazett): 448 ms
R Axis: 68 degrees
T Axis: 40 degrees
Ventricular Rate: 74 {beats}/min

## 2019-12-02 LAB — HEPATIC FUNCTION PANEL
ALT: 49 U/L — ABNORMAL HIGH (ref 5–33)
AST: 34 U/L — ABNORMAL HIGH (ref <32)
Albumin: 4 g/dL (ref 3.5–5.2)
Alkaline Phosphatase: 113 U/L — ABNORMAL HIGH (ref 35–104)
Bilirubin, Direct: 0.1 mg/dL (ref <0.31)
Bilirubin, Indirect: 0.35 mg/dL (ref 0.00–1.00)
Total Bilirubin: 0.45 mg/dL (ref 0.3–1.2)
Total Protein: 7.3 g/dL (ref 6.4–8.3)

## 2019-12-02 LAB — BASIC METABOLIC PANEL
Anion Gap: 12 mmol/L (ref 9–17)
BUN: 15 mg/dL (ref 6–20)
Bun/Cre Ratio: 23 — ABNORMAL HIGH (ref 9–20)
CO2: 24 mmol/L (ref 20–31)
Calcium: 9.3 mg/dL (ref 8.6–10.4)
Chloride: 100 mmol/L (ref 98–107)
Creatinine: 0.65 mg/dL (ref 0.50–0.90)
GFR African American: >60 mL/min (ref >60)
GFR Non-African American: >60 mL/min (ref >60)
Glucose: 169 mg/dL — ABNORMAL HIGH (ref 70–99)
Potassium: 4.5 mmol/L (ref 3.7–5.3)
Sodium: 136 mmol/L (ref 135–144)

## 2019-12-02 LAB — MAGNESIUM: Magnesium: 1.8 mg/dL (ref 1.6–2.6)

## 2019-12-02 LAB — TSH: TSH: 1.51 mIU/L (ref 0.30–5.00)

## 2019-12-02 LAB — T4, FREE: Thyroxine, Free: 0.87 ng/dL — ABNORMAL LOW (ref 0.93–1.70)

## 2019-12-02 LAB — POCT URINE PREGNANCY: Preg Test, Ur: NEGATIVE

## 2019-12-02 NOTE — ED Provider Notes (Signed)
[facility]  [facility] ED  eMERGENCY dEPARTMENT eNCOUnter      Pt Name: Carolyn Crane  MRN: [id number]  Birthdate 02-08-1976  Date of evaluation: 12/02/2019  Provider: Carolyn Stare, APRN - CNP    CHIEF COMPLAINT       Chief Complaint   Patient presents with   ??? Fatigue     onset 3 days   ??? Dizziness         HISTORY OF PRESENT ILLNESS  (Location/Symptom, Timing/Onset, Context/Setting, Quality, Duration, Modifying Factors, Severity.)   Carolyn Crane is a 44 y.o. female who presents to the emergency department via private auto. Reports fatigue for a few days. Today she developed dizziness, head pressure. Denies fever, chills, vision changes, weakness. Denies chest pain, cough, SOB. Denies N/V/D, urinary symptoms. Rates her pain 2/10 at this time.     Nursing Notes were reviewed.    ALLERGIES     Prochlorperazine, Reglan [metoclopramide], and Zofran [ondansetron hcl]    CURRENT MEDICATIONS       Previous Medications    ARIPIPRAZOLE (ABILIFY) 15 MG TABLET    Take 30 mg by mouth daily     ASPIRIN 81 MG EC TABLET    Take 1 tablet by mouth daily    ATORVASTATIN (LIPITOR) 80 MG TABLET    Take 80 mg by mouth daily    DICYCLOMINE (BENTYL) 10 MG CAPSULE    Take 1 capsule by mouth every 6 hours as needed (cramps)    ESCITALOPRAM (LEXAPRO) 10 MG TABLET    Take 10 mg by mouth daily     FAMOTIDINE (PEPCID) 20 MG TABLET    Take 20 mg by mouth 2 times daily    FENOFIBRATE 160 MG TABLET    Take 160 mg by mouth daily     FUROSEMIDE (LASIX) 20 MG TABLET    Take 1 tablet by mouth daily    GLIMEPIRIDE (AMARYL) 1 MG TABLET    Take 1 mg by mouth every morning (before breakfast)    IBUPROFEN (IBU) 800 MG TABLET    Take 1 tablet by mouth every 6 hours as needed for Pain    INSULIN DEGLUDEC (TRESIBA FLEXTOUCH) 200 UNIT/ML SOPN    Inject 100 Units into the skin every morning     LANSOPRAZOLE (PREVACID) 30 MG DELAYED RELEASE CAPSULE    Take 30 mg by mouth daily    LIRAGLUTIDE (VICTOZA SC)    Inject 1.8 Units into the skin daily      LORAZEPAM (ATIVAN) 0.5 MG TABLET    Take 0.5 mg by mouth 2 times daily as needed for Anxiety.    SUMATRIPTAN (IMITREX) 50 MG TABLET    Take 1 tablet by mouth daily as needed for Migraine    VALACYCLOVIR (VALTREX) 500 MG TABLET    Take 1,000 mg by mouth 2 times daily as needed        PAST MEDICAL HISTORY         Diagnosis Date   ??? Bipolar 1 disorder (HCC)    ??? Bipolar 1 disorder (HCC)    ??? Depression    ??? Diabetes mellitus (HCC)    ??? Fibromyalgia    ??? Hyperlipidemia    ??? IBS (irritable bowel syndrome)    ??? PCOS (polycystic ovarian syndrome)        SURGICAL HISTORY           Procedure Laterality Date   ??? APPENDECTOMY     ??? CHOLECYSTECTOMY     ???  FOOT SURGERY Left     plantar fascitiis release   ??? SHOULDER SURGERY Left    ??? TONSILLECTOMY           FAMILY HISTORY           Problem Relation Age of Onset   ??? Cancer Mother    ??? Stroke Mother    ??? Stroke Father    ??? Cancer Maternal Grandmother    ??? Stroke Maternal Grandmother    ??? Cancer Maternal Grandfather      Family Status   Relation Name Status   ??? Mother  (Not Specified)   ??? Father  (Not Specified)   ??? MGM  (Not Specified)   ??? MGF  (Not Specified)        SOCIAL HISTORY      reports that she has never smoked. She has never used smokeless tobacco. She reports previous alcohol use. She reports that she does not use drugs.    REVIEW OF SYSTEMS    (2-9 systems for level 4, 10 or more for level 5)     Review of Systems   Constitutional: Positive for fatigue. Negative for chills, diaphoresis and fever.   HENT: Negative for congestion, ear discharge, ear pain, postnasal drip, rhinorrhea, sinus pressure and sore throat.    Eyes: Negative for photophobia, pain and visual disturbance.   Respiratory: Negative for cough and shortness of breath.    Cardiovascular: Negative for chest pain and palpitations.   Gastrointestinal: Negative for abdominal pain, diarrhea, nausea and vomiting.   Genitourinary: Negative for dysuria.   Musculoskeletal: Negative for arthralgias, myalgias, neck  pain and neck stiffness.   Skin: Negative for color change and rash.   Neurological: Positive for dizziness. Negative for facial asymmetry, speech difficulty, weakness, light-headedness, numbness and headaches.   Psychiatric/Behavioral: Negative for confusion.     Except as noted above the remainder of the review of systems was reviewed and negative.     PHYSICAL EXAM    (up to 7 for level 4, 8 or more for level 5)     ED Triage Vitals [12/02/19 1030]   BP Temp Temp Source Pulse Resp SpO2 Height Weight   119/74 98.1 ??F (36.7 ??C) Oral 95 16 97 % 5\' 5"  (1.651 m) 205 lb (93 kg)     Physical Exam  Vitals reviewed.   Constitutional:       General: She is not in acute distress.     Appearance: She is well-developed. She is not diaphoretic.   HENT:      Right Ear: External ear normal.      Left Ear: External ear normal.   Eyes:      General: No scleral icterus.     Extraocular Movements: Extraocular movements intact.      Conjunctiva/sclera: Conjunctivae normal.      Pupils: Pupils are equal, round, and reactive to light.   Cardiovascular:      Rate and Rhythm: Normal rate and regular rhythm.      Heart sounds: Normal heart sounds.   Pulmonary:      Effort: Pulmonary effort is normal. No respiratory distress.      Breath sounds: Normal breath sounds. No wheezing or rales.   Abdominal:      General: There is no distension.      Palpations: Abdomen is soft.      Tenderness: There is no abdominal tenderness.   Musculoskeletal:      Cervical back: Neck supple.  Comments: Moves extremities   Skin:     General: Skin is warm and dry.      Findings: No rash.   Neurological:      Mental Status: She is alert and oriented to person, place, and time.      GCS: GCS eye subscore is 4. GCS verbal subscore is 5. GCS motor subscore is 6.      Cranial Nerves: Cranial nerves are intact.      Motor: Motor function is intact.      Coordination: Coordination is intact.      Gait: Gait is intact.   Psychiatric:         Behavior: Behavior  normal.         DIAGNOSTIC RESULTS     EKG: All EKG's are interpreted by the Emergency Department Physician who either signs or Co-signs this chart in the absence of a cardiologist.  EKG was interpreted by Dr. Joycelyn Das after completion.      RADIOLOGY:   Non-plain film images such as CT, Ultrasound and MRI are read by the radiologist. Plain radiographic images are visualized and preliminarily interpreted by the emergency physician with the below findings:    Interpretation per the Radiologist below, if available at the time of this note:    CT HEAD WO CONTRAST    Result Date: 12/02/2019  EXAMINATION: CT OF THE HEAD WITHOUT CONTRAST  12/02/2019 12:03 pm TECHNIQUE: CT of the head was performed without the administration of intravenous contrast. Dose modulation, iterative reconstruction, and/or weight based adjustment of the mA/kV was utilized to reduce the radiation dose to as low as reasonably achievable. COMPARISON: 09/17/2019, 09/18/2019 HISTORY: ORDERING SYSTEM PROVIDED HISTORY: HA TECHNOLOGIST PROVIDED HISTORY: HA Decision Support Exception - unselect if not a suspected or confirmed emergency medical condition->Emergency Medical Condition (MA) Is the patient pregnant?->No Reason for Exam: Frontal headache and dizziness x 3 days, no trauma or injury. No prior history of seizure, stroke or surgery Acuity: Acute Type of Exam: Initial FINDINGS: BRAIN/VENTRICLES: There is no acute intracranial hemorrhage, mass effect or midline shift.  No abnormal extra-axial fluid collection.  The gray-white differentiation is maintained without evidence of an acute infarct.  There is no evidence of hydrocephalus. Overall appearance is similar to the prior study. ORBITS: The visualized portion of the orbits demonstrate no acute abnormality. SINUSES: The visualized paranasal sinuses and mastoid air cells demonstrate no acute abnormality. SOFT TISSUES/SKULL:  No acute abnormality of the visualized skull or soft tissues.     No acute  intracranial abnormality.       LABS:  Labs Reviewed   BASIC METABOLIC PANEL - Abnormal; Notable for the following components:       Result Value    Glucose 169 (*)     Bun/Cre Ratio 23 (*)     All other components within normal limits   CBC WITH AUTO DIFFERENTIAL - Abnormal; Notable for the following components:    Seg Neutrophils 69 (*)     Lymphocytes 23 (*)     Immature Granulocytes 1 (*)     All other components within normal limits   HEPATIC FUNCTION PANEL - Abnormal; Notable for the following components:    Alkaline Phosphatase 113 (*)     ALT 49 (*)     AST 34 (*)     All other components within normal limits   T4, FREE - Abnormal; Notable for the following components:    Thyroxine, Free 0.87 (*)     All  other components within normal limits   URINALYSIS WITH MICROSCOPIC - Abnormal; Notable for the following components:    Turbidity UA SLIGHTLY CLOUDY (*)     Specific Gravity, UA 1.038 (*)     Mucus, UA 2+ (*)     Amorphous, UA 1+ (*)     All other components within normal limits   POCT URINE PREGNANCY - Normal   MAGNESIUM   TSH WITHOUT REFLEX       All other labs were within normal range or not returned as of this dictation.    EMERGENCY DEPARTMENT COURSE and DIFFERENTIAL DIAGNOSIS/MDM:   Vitals:    Vitals:    12/02/19 1030   BP: 119/74   Pulse: 95   Resp: 16   Temp: 98.1 ??F (36.7 ??C)   TempSrc: Oral   SpO2: 97%   Weight: 205 lb (93 kg)   Height: 5\' 5"  (1.651 m)           CLINICAL DECISION MAKING:  The patient presented alert with a nontoxic appearance and was seen in conjunction with Dr. . Laboratory studies were unremarkable. Imaging was negative for acute findings. Follow up with pcp. Evaluation and treatment course in the ED, and plan of care upon discharge was discussed in length with the patient.  Patient had no further questions prior to being discharged and was instructed to return to the ED for new or worsening symptoms.          FINAL IMPRESSION      1. Fatigue, unspecified type    2.  Dizziness            Problem List  Patient Active Problem List   Diagnosis Code   ??? Chest pain R07.9   ??? 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 Dyslipidemia E78.5   ??? Paresthesia R20.2   ??? Diabetes mellitus (HCC) E11.9   ??? Depression F32.9   ??? Bipolar 1 disorder (HCC) F31.9   ??? Fibromyalgia M79.7         DISPOSITION/PLAN   DISPOSITION  DISCHARGE       PATIENT REFERRED TO:   Joycelyn Das, APRN - CNP  [address]  [place]  [phone]    Schedule an appointment as soon as possible for a visit       [facility] ED  [address] [place]  [phone]    If symptoms worsen, As needed      DISCHARGE MEDICATIONS:     New Prescriptions    No medications on file           (Please note that portions of this note were completed with a voice recognition program.  Efforts were made to edit the dictations but occasionally words are mis-transcribed.)    [phone], APRN - CNP      Carolyn Stare, APRN - CNP  12/02/19 1230

## 2019-12-02 NOTE — ED Provider Notes (Signed)
The patient was seen and examined by me in conjunction with the mid-level provider.  I agree with his/her assessment and treatment plan.    Her work-up is negative.  She is afebrile and vital signs are normal.     Cherie Ouch, MD  12/02/19 1228

## 2019-12-04 LAB — POCT URINE PREGNANCY: HCG, Pregnancy Urine (POC): NEGATIVE

## 2019-12-10 ENCOUNTER — Inpatient Hospital Stay
Admit: 2019-12-10 | Discharge: 2019-12-10 | Disposition: A | Discharge status: Home / Self Care | Payer: PRIVATE HEALTH INSURANCE | Attending: Emergency Medicine

## 2019-12-10 ENCOUNTER — Emergency Department: Admit: 2019-12-10 | Payer: PRIVATE HEALTH INSURANCE | Primary: Nurse Practitioner

## 2019-12-10 DIAGNOSIS — K921 Melena: Secondary | ICD-10-CM

## 2019-12-10 LAB — URINALYSIS WITH REFLEX TO CULTURE
Bilirubin Urine: NEGATIVE
Ketones, Urine: NEGATIVE
Leukocyte Esterase, Urine: NEGATIVE
Nitrite, Urine: NEGATIVE
Protein, UA: NEGATIVE
Specific Gravity, UA: 1.049 — ABNORMAL HIGH (ref 1.005–1.030)
Urine Hgb: NEGATIVE
Urobilinogen, Urine: NORMAL
pH, UA: 5.5 (ref 5.0–8.0)

## 2019-12-10 LAB — CBC WITH AUTO DIFFERENTIAL
Absolute Eos #: 0.11 10*3/uL (ref 0.00–0.44)
Absolute Immature Granulocyte: 0.05 10*3/uL (ref 0.00–0.30)
Absolute Lymph #: 1.94 10*3/uL (ref 1.10–3.70)
Absolute Mono #: 0.59 10*3/uL (ref 0.10–1.20)
Basophils Absolute: 0.03 10*3/uL (ref 0.00–0.20)
Basophils: 0 % (ref 0–2)
Eosinophils %: 1 % (ref 1–4)
Hematocrit: 39.3 % (ref 36.3–47.1)
Hemoglobin: 13 g/dL (ref 11.9–15.1)
Immature Granulocytes: 1 % — ABNORMAL HIGH
Lymphocytes: 20 % — ABNORMAL LOW (ref 24–43)
MCH: 30.4 pg (ref 25.2–33.5)
MCHC: 33.1 g/dL (ref 28.4–34.8)
MCV: 92 fL (ref 82.6–102.9)
MPV: 9.7 fL (ref 8.1–13.5)
Monocytes: 6 % (ref 3–12)
NRBC Automated: 0 per 100 WBC
Platelets: 342 10*3/uL (ref 138–453)
RBC: 4.27 m/uL (ref 3.95–5.11)
RDW: 12.2 % (ref 11.8–14.4)
Seg Neutrophils: 72 % — ABNORMAL HIGH (ref 36–65)
Segs Absolute: 6.96 10*3/uL (ref 1.50–8.10)
WBC: 9.7 10*3/uL (ref 3.5–11.3)

## 2019-12-10 LAB — MICROSCOPIC URINALYSIS
Epithelial Cells UA: 20 /HPF (ref 0–5)
RBC, UA: 0 /HPF (ref 0–2)
WBC, UA: 2 /HPF (ref 0–5)

## 2019-12-10 LAB — BASIC METABOLIC PANEL
Anion Gap: 14 mmol/L (ref 9–17)
BUN: 15 mg/dL (ref 6–20)
Bun/Cre Ratio: 19 (ref 9–20)
CO2: 22 mmol/L (ref 20–31)
Calcium: 9.5 mg/dL (ref 8.6–10.4)
Chloride: 98 mmol/L (ref 98–107)
Creatinine: 0.8 mg/dL (ref 0.50–0.90)
GFR African American: >60 mL/min (ref >60)
GFR Non-African American: >60 mL/min (ref >60)
Glucose: 214 mg/dL — ABNORMAL HIGH (ref 70–99)
Potassium: 4.2 mmol/L (ref 3.7–5.3)
Sodium: 134 mmol/L — ABNORMAL LOW (ref 135–144)

## 2019-12-10 LAB — HCG, SERUM, QUALITATIVE: hCG Qual: NEGATIVE

## 2019-12-10 LAB — LIPASE: Lipase: 26 U/L (ref 13–60)

## 2019-12-10 MED ORDER — LANSOPRAZOLE 30 MG PO CPDR
30 MG | ORAL_CAPSULE | Freq: Every day | ORAL | 0 refills | Status: AC
Start: 2019-12-10 — End: ?

## 2019-12-10 MED ORDER — IOPAMIDOL 76 % IV SOLN
76 % | Freq: Once | INTRAVENOUS | Status: AC | PRN
Start: 2019-12-10 — End: 2019-12-10
  Administered 2019-12-10: 20:00:00 75 mL via INTRAVENOUS

## 2019-12-10 MED ORDER — DICYCLOMINE HCL 10 MG PO CAPS
10 MG | Freq: Once | ORAL | Status: AC
Start: 2019-12-10 — End: 2019-12-10
  Administered 2019-12-10: 20:00:00 20 mg via ORAL

## 2019-12-10 MED ORDER — NORMAL SALINE FLUSH 0.9 % IV SOLN
0.9 % | INTRAVENOUS | Status: DC | PRN
Start: 2019-12-10 — End: 2019-12-10

## 2019-12-10 MED ORDER — SODIUM CHLORIDE 0.9 % IV BOLUS
0.9 % | Freq: Once | INTRAVENOUS | Status: AC
Start: 2019-12-10 — End: 2019-12-10
  Administered 2019-12-10: 20:00:00 1000 mL via INTRAVENOUS

## 2019-12-10 MED ORDER — SUCRALFATE 1 G PO TABS
1 GM | ORAL_TABLET | Freq: Four times a day (QID) | ORAL | 0 refills | Status: AC
Start: 2019-12-10 — End: ?

## 2019-12-10 MED ORDER — SODIUM CHLORIDE 0.9 % IV BOLUS
0.9 % | Freq: Once | INTRAVENOUS | Status: AC
Start: 2019-12-10 — End: 2019-12-10
  Administered 2019-12-10: 20:00:00 80 mL via INTRAVENOUS

## 2019-12-10 MED FILL — DICYCLOMINE HCL 10 MG PO CAPS: 10 mg | ORAL | Qty: 2

## 2019-12-10 NOTE — Discharge Instructions (Addendum)
Do not use ibuprofen or other NSAIDs as this can make bleeding worse.  High-fiber diet.  Follow-up with GI doctor.    If given narcotics (opiates) during this Emergency Department visit, you should not drink, drive or operate any machinery for at least 6 hours.    For pain use acetaminophen (Tylenol) unless prescribed medications that have acetaminophen in it.  You can take over the counter acetaminophen tablets (1 - 2 tablets of the 500-mg strength every 6 hours).    PLEASE RETURN TO THE EMERGENCY DEPARTMENT IMMEDIATELY for worsening symptoms, increase in the amount of blood coming from your rectum (butt), feeling light-headed / dizzy, heart racing, abdominal pain, or if you develop any concerning symptoms such as: high fever not relieved by acetaminophen (Tylenol) and/or ibuprofen (Motrin / Advil), chills, shortness of breath, chest pain, feeling of your heart fluttering or racing, persistent nausea and/or vomiting, vomiting up blood, blood in your stool, loss of consciousness, numbness, weakness or tingling in the arms or legs or change in color of the extremities, changes in mental status, persistent headache, blurry vision loss of bladder / bowel control, unable to follow up with your physician, or other any other care or concern.

## 2019-12-10 NOTE — ED Notes (Signed)
Discharge instructions and follow up care reviewed with patient and all questions answered at this time. Patient stable and ambulatory at time of discharge.      Sarita Bottom, RN  12/10/19 1702

## 2019-12-10 NOTE — ED Provider Notes (Signed)
[facility]  Emergency Department Encounter     Pt Name: Carolyn Crane  MRN: [id number]  Birthdate 10/05/1975  Date of evaluation: 12/10/19  PCP:  Everardo AllJOANNA F SHAW, APRN - CNP    CHIEF COMPLAINT       Chief Complaint   Patient presents with   ??? Rectal Bleeding       HISTORY OFPRESENT ILLNESS  (Location/Symptom, Timing/Onset, Context/Setting, Quality, Duration, Modifying Factors,Severity.)      Carolyn Crane who presents with reported history of multiple bright red bloody bowel movements and bilateral lower abdominal cramping and pain.  States she does have history of IBS however states that this is reportedly different than her normal IBS pain and diarrhea.  Has not seen GI doctor recently.  Denies any blood with wiping stating blood is feeling toilet bowl and is bright red.  Pain is bilateral lower abdominal, worse with palpation, worse with movement.  Denies any lightheadedness, syncope.    PAST MEDICAL / SURGICAL / SOCIAL / FAMILY HISTORY      has a past medical history of Bipolar 1 disorder (HCC), Bipolar 1 disorder (HCC), Depression, Diabetes mellitus (HCC), Fibromyalgia, Hyperlipidemia, IBS (irritable bowel syndrome), and PCOS (polycystic ovarian syndrome).     has a past surgical history that includes Appendectomy; Cholecystectomy; Tonsillectomy; Foot surgery (Left); and shoulder surgery (Left).    Social History     Socioeconomic History   ??? Marital status: Divorced     Spouse name: Not on file   ??? Number of children: Not on file   ??? Years of education: Not on file   ??? Highest education level: Not on file   Occupational History   ??? Not on file   Tobacco Use   ??? Smoking status: Never Smoker   ??? Smokeless tobacco: Never Used   Vaping Use   ??? Vaping Use: Never used   Substance and Sexual Activity   ??? Alcohol use: Not Currently     Comment: socially   ??? Drug use: No   ??? Sexual activity: Yes     Partners: Male   Other Topics Concern   ??? Not on file   Social History  Narrative   ??? Not on file     Social Determinants of Health     Financial Resource Strain:    ??? Difficulty of Paying Living Expenses:    Food Insecurity:    ??? Worried About Programme researcher, broadcasting/film/videounning Out of Food in the Last Year:    ??? Baristaan Out of Food in the Last Year:    Transportation Needs:    ??? Freight forwarderLack of Transportation (Medical):    ??? Lack of Transportation (Non-Medical):    Physical Activity:    ??? Days of Exercise per Week:    ??? Minutes of Exercise per Session:    Stress:    ??? Feeling of Stress :    Social Connections:    ??? Frequency of Communication with Friends and Family:    ??? Frequency of Social Gatherings with Friends and Family:    ??? Attends Religious Services:    ??? Database administratorActive Member of Clubs or Organizations:    ??? Attends Engineer, structuralClub or Organization Meetings:    ??? Marital Status:    Intimate Programme researcher, broadcasting/film/videoartner Violence:    ??? Fear of Current or Ex-Partner:    ??? Emotionally Abused:    ??? Physically Abused:    ??? Sexually Abused:  Family History   Problem Relation Age of Onset   ??? Cancer Mother    ??? Stroke Mother    ??? Stroke Father    ??? Cancer Maternal Grandmother    ??? Stroke Maternal Grandmother    ??? Cancer Maternal Grandfather        Allergies:  Prochlorperazine, Reglan [metoclopramide], and Zofran [ondansetron hcl]    Home Medications:  Prior to Admission medications    Medication Sig Start Date End Date Taking? Authorizing Provider   lansoprazole (PREVACID) 30 MG delayed release capsule Take 1 capsule by mouth daily 12/10/19  Yes Despina Hidden, DO   sucralfate (CARAFATE) 1 GM tablet Take 1 tablet by mouth 4 times daily 12/10/19  Yes Despina Hidden, DO   furosemide (LASIX) 20 MG tablet Take 1 tablet by mouth daily 10/30/19   Cecille Rubin, APRN - CNP   ibuprofen (IBU) 800 MG tablet Take 1 tablet by mouth every 6 hours as needed for Pain 10/30/19 12/10/19  Cecille Rubin, APRN - CNP   aspirin 81 MG EC tablet Take 1 tablet by mouth daily 09/19/19   Ivar Drape, APRN - NP   SUMAtriptan (IMITREX) 50 MG tablet Take 1 tablet by mouth daily as needed  for Migraine 09/18/19   Ivar Drape, APRN - NP   glimepiride (AMARYL) 1 MG tablet Take 1 mg by mouth every morning (before breakfast)    Historical Provider, MD   LORazepam (ATIVAN) 0.5 MG tablet Take 0.5 mg by mouth 2 times daily as needed for Anxiety.    Historical Provider, MD   dicyclomine (BENTYL) 10 MG capsule Take 1 capsule by mouth every 6 hours as needed (cramps) 02/22/19 12/10/19  Edna A Lump, APRN - CNP   escitalopram (LEXAPRO) 10 MG tablet Take 10 mg by mouth daily     Historical Provider, MD   Liraglutide (VICTOZA SC) Inject 1.8 Units into the skin daily     Historical Provider, MD   atorvastatin (LIPITOR) 80 MG tablet Take 80 mg by mouth daily    Historical Provider, MD   valACYclovir (VALTREX) 500 MG tablet Take 1,000 mg by mouth 2 times daily as needed     Historical Provider, MD   fenofibrate 160 MG tablet Take 160 mg by mouth daily     Historical Provider, MD   Insulin Degludec (TRESIBA FLEXTOUCH) 200 UNIT/ML SOPN Inject 100 Units into the skin every morning     Historical Provider, MD   ARIPiprazole (ABILIFY) 15 MG tablet Take 30 mg by mouth daily     Historical Provider, MD       REVIEW OF SYSTEMS    (2-9 systems for level 4, 10 or more for level 5)      Review of Systems   Constitutional: Negative for chills and fever.   Eyes: Negative for discharge and redness.   Respiratory: Negative for shortness of breath.    Cardiovascular: Negative for chest pain.   Gastrointestinal: Positive for abdominal pain, blood in stool and nausea.   Genitourinary: Negative for flank pain.   Musculoskeletal: Negative for back pain.   Skin: Negative for rash.   Allergic/Immunologic: Positive for environmental allergies.   Neurological: Negative for headaches.   Psychiatric/Behavioral: Negative for agitation and confusion.       PHYSICAL EXAM   (up to 7 for level 4, 8 or more for level 5)     INITIAL VITALS:    height is 5\' 5"  (1.651 m) and weight is 210  lb (95.3 kg). Her oral temperature is 98.5 ??F (36.9 ??C). Her  blood pressure is 121/86 and her pulse is 110. Her respiration is 18 and oxygen saturation is 97%.     Physical Exam  Vitals and nursing note reviewed.   Constitutional:       Appearance: She is well-developed.   HENT:      Head: Normocephalic and atraumatic.      Nose: Nose normal.      Mouth/Throat:      Mouth: Mucous membranes are moist.   Eyes:      General: No scleral icterus.     Conjunctiva/sclera: Conjunctivae normal.      Pupils: Pupils are equal, round, and reactive to light.   Neck:      Trachea: No tracheal deviation.   Cardiovascular:      Rate and Rhythm: Normal rate and regular rhythm.      Heart sounds: Normal heart sounds. No murmur heard.   No friction rub. No gallop.    Pulmonary:      Effort: Pulmonary effort is normal. No respiratory distress.      Breath sounds: Normal breath sounds. No wheezing or rales.   Abdominal:      General: Bowel sounds are normal. There is no distension.      Palpations: Abdomen is soft. There is no mass.      Tenderness: There is abdominal tenderness. There is no guarding or rebound.      Comments: Mild bilateral lower abdominal pain, no guarding no mass no rebound.   Genitourinary:     Comments: Rectal exam performed with RN chaperone in room.  No active bleeding.  Small external hemorrhoid nonthrombosed, no stool in rectal vault  Musculoskeletal:         General: Normal range of motion.      Cervical back: Neck supple.   Skin:     General: Skin is warm and dry.      Findings: No erythema or rash.   Neurological:      Mental Status: She is alert and oriented to person, place, and time.   Psychiatric:         Behavior: Behavior normal.         DIFFERENTIAL  DIAGNOSIS     PLAN (LABS / IMAGING / EKG):  Orders Placed This Encounter   Procedures   ??? CT ABDOMEN PELVIS W IV CONTRAST Additional Contrast? None   ??? CBC Auto Differential   ??? Lipase   ??? Basic Metabolic Panel   ??? HCG Qualitative, Serum   ??? Urinalysis Reflex to Culture   ??? Microscopic Urinalysis       MEDICATIONS  ORDERED:  Orders Placed This Encounter   Medications   ??? 0.9 % sodium chloride bolus   ??? dicyclomine (BENTYL) capsule 20 mg   ??? iopamidol (ISOVUE-370) 76 % injection 75 mL   ??? 0.9 % sodium chloride bolus   ??? DISCONTD: sodium chloride flush 0.9 % injection 10 mL   ??? lansoprazole (PREVACID) 30 MG delayed release capsule     Sig: Take 1 capsule by mouth daily     Dispense:  30 capsule     Refill:  0   ??? sucralfate (CARAFATE) 1 GM tablet     Sig: Take 1 tablet by mouth 4 times daily     Dispense:  120 tablet     Refill:  0       DDX: Diverticulitis versus diverticular bleed versus  anemia versus lower GI bleeding versus hemorrhoid    Initial MDM/Plan: 44 y.o. Crane who presents with reported bright red blood per rectum.  No blood or stool able to be obtained from rectal exam.  Small external hemorrhoid.  Light of patient's IBS history abdominal pain will get CT imaging rule out diverticulitis or intra-abdominal acute process.  Otherwise stable with stable hemoglobin discharge with outpatient GI follow-up.    DIAGNOSTIC RESULTS / EMERGENCY DEPARTMENT COURSE / MDM     LABS:  Labs Reviewed   CBC WITH AUTO DIFFERENTIAL - Abnormal; Notable for the following components:       Result Value    Seg Neutrophils 72 (*)     Lymphocytes 20 (*)     Immature Granulocytes 1 (*)     All other components within normal limits   BASIC METABOLIC PANEL - Abnormal; Notable for the following components:    Glucose 214 (*)     Sodium 134 (*)     All other components within normal limits   URINE RT REFLEX TO CULTURE - Abnormal; Notable for the following components:    Turbidity UA SLIGHTLY CLOUDY (*)     Glucose, Ur 2+ (*)     Specific Gravity, UA 1.049 (*)     All other components within normal limits   MICROSCOPIC URINALYSIS - Abnormal; Notable for the following components:    Mucus, UA 1+ (*)     All other components within normal limits   LIPASE   HCG, SERUM, QUALITATIVE         RADIOLOGY:  CT ABDOMEN PELVIS W IV CONTRAST Additional  Contrast? None    Result Date: 12/10/2019  EXAMINATION: CT OF THE ABDOMEN AND PELVIS WITH CONTRAST 12/10/2019 4:14 pm TECHNIQUE: CT of the abdomen and pelvis was performed with the administration of intravenous contrast. Multiplanar reformatted images are provided for review. Dose modulation, iterative reconstruction, and/or weight based adjustment of the mA/kV was utilized to reduce the radiation dose to as low as reasonably achievable. COMPARISON: 10/16/2018 HISTORY: ORDERING SYSTEM PROVIDED HISTORY: Left lower quadrant and bilateral lower abdomen pain, multiple bright red bloody bowel movements, rule out diverticulitis TECHNOLOGIST PROVIDED HISTORY: Left lower quadrant and bilateral lower abdomen pain, multiple bright red bloody bowel movements, rule out diverticulitis Decision Support Exception - unselect if not a suspected or confirmed emergency medical condition->Emergency Medical Condition (MA) Reason for Exam: llq pain Acuity: Acute Type of Exam: Initial FINDINGS: Lower Chest: The visualized heart and lungs show no acute abnormalities. Organs: There is 3 cm area of focal geographic asymmetric low attenuation involving the upper portion of the caudate lobe of the liver with sharply demarcated margin.  Suspect benign etiology such as focal steatosis. Cholecystectomy.  The spleen, pancreas, adrenal glands show no significant abnormalities.  Symmetric enhancement of kidneys. GI/Bowel: There is limited evaluation due to absence of oral contrast. Stomach grossly normal.  Small bowel loops show no focal lesions.  No evidence for small bowel obstruction.  The terminal ileum is unremarkable. No evidence for acute appendicitis although appendix is not visualized. Evaluation of the colon shows no acute process.  The colon is mostly collapsed which in conjunction with lack of GI tract contrast limits evaluation for focal lesions. Pelvis: Uterus and adnexa unremarkable.  Pelvic phleboliths.  Urinary bladder grossly normal.  Peritoneum/Retroperitoneum: No free fluid.  No significant lymphadenopathy. Bones/Soft Tissues: No acute abnormality of the bones.  The superficial soft tissues show no significant abnormalities.     1. No  acute infective or inflammatory process.  No findings to account for patient's symptoms.  The conus not optimally assessed due to lack of intraluminal contrast and collapsed nature.  Colonoscopy may be considered for further evaluation if deemed clinically necessary. 2. A 3 cm geographic area of low attenuation involving the upper portion of the caudate lobe has developed since June 2020.  Appearance benign.  Probable focal steatosis.  MRI may be considered for further evaluation.       EMERGENCY DEPARTMENT COURSE:  Vital stable.  Hemoglobin stable.  Mildly tachycardic however with no visualized rectal bleeding.  Normal rectal exam.  Encourage patient to follow-up with GI doctor for colonoscopy.  No NSAIDs.  Start on PPI.  Return for any worsening signs or symptoms including lightheadedness syncope.    ?? Based on the low acuity of concerning symptoms and improvement of symptoms, patient will be discharged with follow up and prescription information listed in the Disposition section.  ?? Patient states they will follow-up with primary care physician and/or return to the emergency department should they experience a change or worsening of symptoms.  ?? Patient will be discharged with resources: summary of visit, instructions, follow-up information, prescriptions if necessary and clinics available.  ?? Patient/ family instructed to read discharge paperwork. All of their questions and concerns were addressed.   ?? Suspicion for any acute life-threatening processes is low.   Patient voices understanding of plan.      PROCEDURES:  None    CONSULTS:  None    CRITICAL CARE:  0    FINAL IMPRESSION      1. Lower GI bleed          DISPOSITION / PLAN     DISPOSITION Decision To Discharge 12/10/2019 04:50:46  PM        PATIENTREFERRED TO:  Corky Mull, MD  [address]  [place] [address]  [phone]    Schedule an appointment as soon as possible for a visit         DISCHARGE MEDICATIONS:  Discharge Medication List as of 12/10/2019  4:53 PM      START taking these medications    Details   sucralfate (CARAFATE) 1 GM tablet Take 1 tablet by mouth 4 times daily, Disp-120 tablet, R-0Print             Despina Hidden, DO  EmergencyMedicine Attending    (Please note that portions of this note were completed with a voice recognition program.  Efforts were made to edit the dictations but occasionally words are mis-transcribed.)       Despina Hidden, DO  12/11/19 0002

## 2020-02-11 ENCOUNTER — Inpatient Hospital Stay
Admit: 2020-02-11 | Discharge: 2020-02-11 | Disposition: A | Discharge status: Home / Self Care | Payer: PRIVATE HEALTH INSURANCE | Attending: Emergency Medicine

## 2020-02-11 DIAGNOSIS — S01511A Laceration without foreign body of lip, initial encounter: Secondary | ICD-10-CM

## 2020-02-11 MED ORDER — CLINDAMYCIN HCL 150 MG PO CAPS
150 MG | Freq: Once | ORAL | Status: DC
Start: 2020-02-11 — End: 2020-02-11

## 2020-02-11 MED ORDER — CHLORHEXIDINE GLUCONATE 0.12 % MT SOLN
0.12 % | Freq: Two times a day (BID) | OROMUCOSAL | 0 refills | Status: AC
Start: 2020-02-11 — End: 2020-02-21

## 2020-02-11 MED ORDER — CLINDAMYCIN HCL 150 MG PO CAPS
150 MG | Freq: Once | ORAL | Status: AC
Start: 2020-02-11 — End: 2020-02-11
  Administered 2020-02-11: 17:00:00 450 mg via ORAL

## 2020-02-11 MED ORDER — CLINDAMYCIN HCL 300 MG PO CAPS
300 MG | ORAL_CAPSULE | Freq: Three times a day (TID) | ORAL | 0 refills | Status: AC
Start: 2020-02-11 — End: 2020-02-18

## 2020-02-11 MED FILL — CLINDAMYCIN HCL 150 MG PO CAPS: 150 mg | ORAL | Qty: 3

## 2020-02-11 NOTE — ED Provider Notes (Signed)
[facility]  [address]  [place] [id number]  Phone: [phone]        [facility]  EMERGENCY DEPARTMENT ENCOUNTER      Pt Name: Carolyn Crane  MRN: [id number]  Birthdate 04/21/1976  Date of evaluation: 02/11/2020  Provider: Delbert Pheniximothy M Amit Meloy, DO    CHIEF COMPLAINT       Chief Complaint   Patient presents with   ??? Laceration   ??? Wound Infection         HISTORY OF PRESENT ILLNESS   (Location/Symptom, Timing/Onset,Context/Setting, Quality, Duration, Modifying Factors, Severity)  Note limiting factors.   Carolyn Crane is a 44 y.o. female who presents to the emergency department for the evaluation of laceration on her lower lip.  Patient had a tooth go through her lip, [facility], they did a laceration repair few days back, they did not put her on an antibiotic.  She states that it smells funny and it hurts and she is concerned for infection.  No fevers, she does have chills    Nursing Notes were reviewed.    REVIEW OF SYSTEMS    (2-9systems for level 4, 10 or more for level 5)     Review of Systems   Constitutional: Negative for fever.   Skin: Positive for wound.        Lip pain/injury       Except asnoted above the remainder of the review of systems was reviewed and negative.       PAST MEDICAL HISTORY     Past Medical History:   Diagnosis Date   ??? Bipolar 1 disorder (HCC)    ??? Bipolar 1 disorder (HCC)    ??? Depression    ??? Diabetes mellitus (HCC)    ??? Fibromyalgia    ??? Hyperlipidemia    ??? IBS (irritable bowel syndrome)    ??? PCOS (polycystic ovarian syndrome)          SURGICAL HISTORY       Past Surgical History:   Procedure Laterality Date   ??? APPENDECTOMY     ??? CHOLECYSTECTOMY     ??? FOOT SURGERY Left     plantar fascitiis release   ??? SHOULDER SURGERY Left    ??? TONSILLECTOMY           CURRENT MEDICATIONS     Previous Medications    ARIPIPRAZOLE (ABILIFY) 15 MG TABLET    Take 30 mg by mouth daily     ASPIRIN 81 MG EC TABLET    Take 1 tablet by mouth daily    ATORVASTATIN  (LIPITOR) 80 MG TABLET    Take 80 mg by mouth daily    ESCITALOPRAM (LEXAPRO) 10 MG TABLET    Take 10 mg by mouth daily     FENOFIBRATE 160 MG TABLET    Take 160 mg by mouth daily     FUROSEMIDE (LASIX) 20 MG TABLET    Take 1 tablet by mouth daily    GLIMEPIRIDE (AMARYL) 1 MG TABLET    Take 1 mg by mouth every morning (before breakfast)    INSULIN DEGLUDEC (TRESIBA FLEXTOUCH) 200 UNIT/ML SOPN    Inject 100 Units into the skin every morning     LANSOPRAZOLE (PREVACID) 30 MG DELAYED RELEASE CAPSULE    Take 1 capsule by mouth daily    LIRAGLUTIDE (VICTOZA SC)    Inject 1.8 Units into the skin daily     LORAZEPAM (  ATIVAN) 0.5 MG TABLET    Take 0.5 mg by mouth 2 times daily as needed for Anxiety.    SUCRALFATE (CARAFATE) 1 GM TABLET    Take 1 tablet by mouth 4 times daily    SUMATRIPTAN (IMITREX) 50 MG TABLET    Take 1 tablet by mouth daily as needed for Migraine    VALACYCLOVIR (VALTREX) 500 MG TABLET    Take 1,000 mg by mouth 2 times daily as needed        ALLERGIES     Prochlorperazine, Reglan [metoclopramide], and Zofran [ondansetron hcl]    FAMILY HISTORY       Family History   Problem Relation Age of Onset   ??? Cancer Mother    ??? Stroke Mother    ??? Stroke Father    ??? Cancer Maternal Grandmother    ??? Stroke Maternal Grandmother    ??? Cancer Maternal Grandfather           SOCIAL HISTORY       Social History     Socioeconomic History   ??? Marital status: Divorced     Spouse name: Not on file   ??? Number of children: Not on file   ??? Years of education: Not on file   ??? Highest education level: Not on file   Occupational History   ??? Not on file   Tobacco Use   ??? Smoking status: Never Smoker   ??? Smokeless tobacco: Never Used   Vaping Use   ??? Vaping Use: Never used   Substance and Sexual Activity   ??? Alcohol use: Not Currently     Comment: socially   ??? Drug use: No   ??? Sexual activity: Yes     Partners: Male   Other Topics Concern   ??? Not on file   Social History Narrative   ??? Not on file     Social Determinants of Health      Financial Resource Strain:    ??? Difficulty of Paying Living Expenses:    Food Insecurity:    ??? Worried About Programme researcher, broadcasting/film/video in the Last Year:    ??? Barista in the Last Year:    Transportation Needs:    ??? Freight forwarder (Medical):    ??? Lack of Transportation (Non-Medical):    Physical Activity:    ??? Days of Exercise per Week:    ??? Minutes of Exercise per Session:    Stress:    ??? Feeling of Stress :    Social Connections:    ??? Frequency of Communication with Friends and Family:    ??? Frequency of Social Gatherings with Friends and Family:    ??? Attends Religious Services:    ??? Database administrator or Organizations:    ??? Attends Engineer, structural:    ??? Marital Status:    Intimate Programme researcher, broadcasting/film/video Violence:    ??? Fear of Current or Ex-Partner:    ??? Emotionally Abused:    ??? Physically Abused:    ??? Sexually Abused:        SCREENINGS             PHYSICAL EXAM    (up to 7 for level 4, 8 or more for level 5)     ED Triage Vitals [02/11/20 1222]   BP Temp Temp Source Pulse Resp SpO2 Height Weight   111/84 98.6 ??F (37 ??C) Temporal 112 12 97 % 5' 5.5" (1.664  m) 211 lb (95.7 kg)       Physical Exam  Vitals and nursing note reviewed.   Constitutional:       General: She is not in acute distress.     Appearance: Normal appearance. She is not ill-appearing or toxic-appearing.   HENT:      Head: Normocephalic and atraumatic.      Nose: Nose normal. No congestion.      Mouth/Throat:      Mouth: Mucous membranes are moist.   Eyes:      General:         Right eye: No discharge.         Left eye: No discharge.      Conjunctiva/sclera: Conjunctivae normal.   Cardiovascular:      Rate and Rhythm: Normal rate and regular rhythm.      Pulses: Normal pulses.      Heart sounds: Normal heart sounds. No murmur heard.     Pulmonary:      Effort: Pulmonary effort is normal. No respiratory distress.      Breath sounds: Normal breath sounds.   Abdominal:      General: There is no distension.   Musculoskeletal:          General: No deformity or signs of injury. Normal range of motion.      Cervical back: Normal range of motion.   Skin:     General: Skin is warm and dry.      Capillary Refill: Capillary refill takes less than 2 seconds.      Findings: No rash.      Comments: Patient does have a wound on the lower lip, there are 3 stitches in it, is clean, dry and intact with a little bit of erythema and it is tender to the touch.  On the inside there appears to be some granulation tissue forming   Neurological:      General: No focal deficit present.      Mental Status: She is alert. Mental status is at baseline.      Motor: No weakness.      Comments: Speaking normally. No facial asymmetry. Moving all 4 extremities. Normal gait.          EMERGENCY DEPARTMENT COURSE and DIFFERENTIAL DIAGNOSIS/MDM:   Vitals:    Vitals:    02/11/20 1222   BP: 111/84   Pulse: 112   Resp: 12   Temp: 98.6 ??F (37 ??C)   TempSrc: Temporal   SpO2: 97%   Weight: 95.7 kg (211 lb)   Height: 5' 5.5" (1.664 m)       Patient presents to the emergency department with a complaint described above.  Vital signs are grossly normal, patient nontoxic, physical exam reveals no obvious abnormalities or deficits.  However, I cannot rule out an early infection developing and I am going to start her on clindamycin and discharged with chlorhexidine    At this time the patient is without objective evidence of an acute process requiring hospitalization or inpatient management. They have remained hemodynamically stable and are stable for discharge with outpatient follow-up.     Standard anticipatory guidance given to patient upon discharge.  Have given them a specific time frame in which to follow-up and who to follow-up with.  I have also advised them that they should return to the emergency department if they get worse, or not getting better or develop any new or concerning symptoms.  Patient demonstrates understanding.  PROCEDURES:  Unless otherwise noted below, none      Procedures    FINAL IMPRESSION      1. Wound infection          DISPOSITION/PLAN   DISPOSITION Decision To Discharge 02/11/2020 12:36:30 PM      PATIENT REFERRED TO:  Stoney Bang, APRN - CNP  [address]  [place] [address]  [phone]    In 1 week        DISCHARGE MEDICATIONS:  New Prescriptions    CHLORHEXIDINE (PERIDEX) 0.12 % SOLUTION    Take 15 mLs by mouth 2 times daily for 10 days    CLINDAMYCIN (CLEOCIN) 300 MG CAPSULE    Take 1 capsule by mouth 3 times daily for 7 days          (Please note that portions of this note were completed with a voice recognition program.  Efforts were made to edit the dictations but occasionally words are mis-transcribed.)    Delbert Phenix, DO (electronically signed)  Board Certified Emergency Physician         Delbert Phenix, DO  02/11/20 1252

## 2020-02-11 NOTE — Discharge Instructions (Signed)
Please make an appointment to follow up with your primary doctor and/or the specialist as we discussed. Take all medications as prescribed.  Return to ER if condition worsens or you develop any new/concerning symptoms as we discussed.

## 2020-02-14 ENCOUNTER — Inpatient Hospital Stay
Admit: 2020-02-14 | Discharge: 2020-02-14 | Disposition: A | Discharge status: Home / Self Care | Payer: PRIVATE HEALTH INSURANCE | Attending: Emergency Medicine

## 2020-02-14 DIAGNOSIS — S01511D Laceration without foreign body of lip, subsequent encounter: Secondary | ICD-10-CM

## 2020-02-14 MED ORDER — ACETAMINOPHEN 500 MG PO TABS
500 | ORAL_TABLET | Freq: Four times a day (QID) | ORAL | 0 refills | 12.00000 days | Status: DC | PRN
Start: 2020-02-14 — End: 2024-03-14

## 2020-02-14 MED ORDER — IBUPROFEN 800 MG PO TABS
800 | ORAL_TABLET | Freq: Three times a day (TID) | ORAL | 0 refills | Status: DC | PRN
Start: 2020-02-14 — End: 2024-01-04

## 2020-02-14 NOTE — Discharge Instructions (Signed)
PLEASE RETURN TO THE EMERGENCY DEPARTMENT IMMEDIATELY if your symptoms worsen in anyway.  You should immediately return to the ER for symptoms such as increasing pain, fever, drainage from the wound, warmth or redness around the cut, increasing swelling, numbness or weakness to the extremity, color change or coldness to the extremity    Take your medication as indicated and prescribed.  If you are given an antibiotic then, make sure you get the prescription filled and take the antibiotics until finished.  It is advised that you keep your wound clean and dry.  You may apply antibiotic ointment to the area.       Please understand that at this time there is no evidence for a more serious underlying process, but that early in the process of an illness or injury, an emergency department workup can be falsely reassuring.  You should contact your family doctor within the next 48 hours for a follow up appointment.      THANK YOU!!!    From [facility] and [facility]    On behalf of the Emergency Department staff at [facility], I would like to thank you for giving us the opportunity to address your health care needs and concerns.    We hope that during your visit, our service was delivered in a professional and caring manner. Please keep [facility] in mind as we walk with you down the path to your own personal wellness.     Please expect an automated text message or email from us so we can ask a few questions about your health and progress. Based on your answers, a clinician may call you back to offer help and instructions.    Please understand that early in the process of an illness or injury, an emergency department workup can be falsely reassuring.  If you notice any worsening, changing or persistent symptoms please call your family doctor or return to the ER immediately.     Tell us how we did during your visit at [email or web address]   and let us know about your experience

## 2020-02-14 NOTE — ED Provider Notes (Signed)
[facility] ED  [address] [place] [address]  Phone: [phone]        Pt Name: Carolyn Crane  MRN: [id number]  Birthdate [birth year]  Date of evaluation: 02/14/20    CHIEFCOMPLAINT       Chief Complaint   Patient presents with   ??? Mouth Injury       HISTORY OF PRESENT ILLNESS (Location/Symptom, Timing/Onset, Context/Setting, Quality, Duration, Modifying Factors, Severity)      Carolyn Crane is a 44 y.o. female  who presents to the ED via private auto with wound check to prior injury to her lip that was repaired at [facility] 5 days ago.  States that she fell face first onto a barbell and had a through and through laceration that needed for internal sutures and 3 external sutures to her lip.  She complained of increased pain, CT scans of lower hospital showed no abnormalities on the CT head and CT facial bones.  She is able speak in full sentences and does not show any difficulty breathing at this time.  Patient has been taking Tylenol and ibuprofen at home that does take the edge off of the pain but does not completely resolve the pain.  She is currently taking clindamycin and chlorhexidine mouth rinse for infection to the internal sutures.    PAST MEDICAL / SURGICAL / SOCIAL / FAMILY HISTORY     PMH:  has a past medical history of Bipolar 1 disorder (HCC), Bipolar 1 disorder (HCC), Depression, Diabetes mellitus (HCC), Fibromyalgia, Hyperlipidemia, IBS (irritable bowel syndrome), and PCOS (polycystic ovarian syndrome).  Surgical History:  has a past surgical history that includes Appendectomy; Cholecystectomy; Tonsillectomy; Foot surgery (Left); and shoulder surgery (Left).  Social History:  reports that she has never smoked. She has never used smokeless tobacco. She reports previous alcohol use. She reports that she does not use drugs.  Family History: She indicated that the status of her mother is unknown. She indicated that the status of her father is unknown. She indicated that the  status of her maternal grandmother is unknown. She indicated that the status of her maternal grandfather is unknown.   family history includes Cancer in her maternal grandfather, maternal grandmother, and mother; Stroke in her father, maternal grandmother, and mother.  Psychiatric History: None    Allergies: Prochlorperazine, Reglan [metoclopramide], and Zofran [ondansetron hcl]    Home Medications:   Prior to Admission medications    Medication Sig Start Date End Date Taking? Authorizing Provider   acetaminophen (TYLENOL) 500 MG tablet Take 1 tablet by mouth every 6 hours as needed for Pain 02/14/20 02/28/20 Yes Ihor Austin Tieara Flitton, APRN - CNP   ibuprofen (ADVIL;MOTRIN) 800 MG tablet Take 1 tablet by mouth every 8 hours as needed for Pain 02/14/20 02/28/20 Yes Ihor Austin Takaya Hyslop, APRN - CNP   clindamycin (CLEOCIN) 300 MG capsule Take 1 capsule by mouth 3 times daily for 7 days 02/11/20 02/18/20  Delbert Phenix, DO   chlorhexidine (PERIDEX) 0.12 % solution Take 15 mLs by mouth 2 times daily for 10 days 02/11/20 02/21/20  Delbert Phenix, DO   lansoprazole (PREVACID) 30 MG delayed release capsule Take 1 capsule by mouth daily 12/10/19   Despina Hidden, DO   sucralfate (CARAFATE) 1 GM tablet Take 1 tablet by mouth 4 times daily 12/10/19   Despina Hidden, DO   furosemide (LASIX) 20 MG tablet Take 1 tablet by mouth daily 10/30/19   Cecille Rubin, APRN -  CNP   aspirin 81 MG EC tablet Take 1 tablet by mouth daily 09/19/19   Ivar Drape, APRN - NP   SUMAtriptan (IMITREX) 50 MG tablet Take 1 tablet by mouth daily as needed for Migraine 09/18/19   Ivar Drape, APRN - NP   glimepiride (AMARYL) 1 MG tablet Take 1 mg by mouth every morning (before breakfast)    Historical Provider, MD   LORazepam (ATIVAN) 0.5 MG tablet Take 0.5 mg by mouth 2 times daily as needed for Anxiety.    Historical Provider, MD   dicyclomine (BENTYL) 10 MG capsule Take 1 capsule by mouth every 6 hours as needed (cramps) 02/22/19 12/10/19  Edna A Lump,  APRN - CNP   escitalopram (LEXAPRO) 10 MG tablet Take 10 mg by mouth daily     Historical Provider, MD   Liraglutide (VICTOZA SC) Inject 1.8 Units into the skin daily     Historical Provider, MD   atorvastatin (LIPITOR) 80 MG tablet Take 80 mg by mouth daily    Historical Provider, MD   valACYclovir (VALTREX) 500 MG tablet Take 1,000 mg by mouth 2 times daily as needed     Historical Provider, MD   fenofibrate 160 MG tablet Take 160 mg by mouth daily     Historical Provider, MD   Insulin Degludec (TRESIBA FLEXTOUCH) 200 UNIT/ML SOPN Inject 100 Units into the skin every morning     Historical Provider, MD   ARIPiprazole (ABILIFY) 15 MG tablet Take 30 mg by mouth daily     Historical Provider, MD       REVIEW OF SYSTEMS  (2-9 systems for level 4, 10 ormore for level 5)      Review of Systems   Constitutional: Negative.    HENT: Negative.  Negative for dental problem.    Eyes: Negative.    Respiratory: Negative.    Cardiovascular: Negative.    Gastrointestinal: Negative.    Endocrine: Negative.    Genitourinary: Negative.    Musculoskeletal: Negative.    Skin: Positive for wound.         All other systems negative except as marked.     PHYSICAL EXAM  (up to 7 for level 4, 8 or more for level 5)      INITIAL VITALS:  height is 5\' 5"  (1.651 m) and weight is 95.7 kg (211 lb). Her oral temperature is 98.3 ??F (36.8 ??C). Her blood pressure is 138/82 and her pulse is 99. Her respiration is 18 and oxygen saturation is 98%.      Vital signs reviewed.    Physical Exam  Constitutional:       Appearance: Normal appearance.   HENT:      Head: Normocephalic and atraumatic.      Nose: Nose normal. No rhinorrhea.      Mouth/Throat:      Mouth: Mucous membranes are moist.      Pharynx: Oropharynx is clear.     Eyes:      Extraocular Movements: Extraocular movements intact.   Cardiovascular:      Rate and Rhythm: Normal rate and regular rhythm.      Pulses: Normal pulses.      Heart sounds: Normal heart sounds.   Pulmonary:      Effort:  Pulmonary effort is normal.      Breath sounds: Normal breath sounds.   Abdominal:      General: Abdomen is flat.      Palpations: Abdomen is soft.  Musculoskeletal:         General: Normal range of motion.      Cervical back: Neck supple. No rigidity.   Skin:     General: Skin is warm and dry.   Neurological:      General: No focal deficit present.      Mental Status: She is alert. Mental status is at baseline.   Psychiatric:         Mood and Affect: Mood normal.         Behavior: Behavior normal.           DIFFERENTIAL DIAGNOSIS / MDM     After my physical exam and reviewing the patient's history, I plan to remove the external sutures as I believe her laceration is healed at this time.  We will also provide the patient with prescription for ibuprofen and Tylenol as well.  3 sutures were removed from the external laceration with no bleeding or drainage noted.  Patient tolerated procedure well.  Plan discharge patient home to follow-up with her PCP and to continue antibiotic regiment.  Patient agreed with this plan this time.  All questions and concerns answered at this time.    There are no signs of infection, there are no foreign bodies within the wound, there are no signs of compartment syndrome.  The sensation is intact.   The patient was instructed to follow up in 2-3 days with their family doctor for a wound check.  We also discussed returning to the Emergency Department immediately if new or worsening symptoms occur.  We have discussed the symptoms which are most concerning (e.g., increasing pain, fever, drainage from the wound or other signs of infection, numbness, weakness, color change or coldness to the extremity) that necessitate immediate return.  The patient was advised to keep the wound clean and dry, they may apply antibiotic ointment to the wound.      The patient understands that at this time there is no evidence for a more malignant underlying process, but the patient also understands that early  in the process of an illness or injury, an emergency department workup can be falsely reassuring.  Routine discharge counseling was given, and the patient understands that worsening, changing or persistent symptoms should prompt an immediate call or follow up with their primary physician or return to the emergency department. The importance of appropriate follow up was also discussed.  I have reviewed the disposition diagnosis with the patient and or their family/guardian.  I have answered their questions and given discharge instructions.  They voiced understanding of these instructions and did not have any further questions or complaints.    PLAN (LABS / IMAGING / EKG):  No orders of the defined types were placed in this encounter.      MEDICATIONS ORDERED:  Orders Placed This Encounter   Medications   ??? acetaminophen (TYLENOL) 500 MG tablet     Sig: Take 1 tablet by mouth every 6 hours as needed for Pain     Dispense:  56 tablet     Refill:  0   ??? ibuprofen (ADVIL;MOTRIN) 800 MG tablet     Sig: Take 1 tablet by mouth every 8 hours as needed for Pain     Dispense:  42 tablet     Refill:  0       Controlled Substances Monitoring:     DIAGNOSTIC RESULTS     EKG: All EKG's are interpreted by the Emergency Department Physician who  either signs or Co-signs this chart in the absenceof a cardiologist.        RADIOLOGY: All images are read by the radiologist and their interpretations are reviewed.    No orders to display       No results found.    LABS:  No results found for this visit on 02/14/20.    EMERGENCY DEPARTMENT COURSE           Vitals:    Vitals:    02/14/20 1841   BP: 138/82   Pulse: 99   Resp: 18   Temp: 98.3 ??F (36.8 ??C)   TempSrc: Oral   SpO2: 98%   Weight: 95.7 kg (211 lb)   Height: 5\' 5"  (1.651 m)     -------------------------  BP: 138/82, Temp: 98.3 ??F (36.8 ??C), Pulse: 99, Resp: 18      RE-EVALUATION:  See ED Course notes above.        CONSULTS:  None    PROCEDURES:  None    FINAL IMPRESSION      1.  Visit for wound check          DISPOSITION / PLAN     CONDITION ON DISPOSITION:   Good for discharge.     PATIENT REFERRED TO:  , APRN - CNP  [address]  [place]  [phone]    Call in 1 day      [facility] ED  [address]  [place]  [phone]    If symptoms worsen      DISCHARGE MEDICATIONS:  Discharge Medication List as of 02/14/2020  7:15 PM      START taking these medications    Details   acetaminophen (TYLENOL) 500 MG tablet Take 1 tablet by mouth every 6 hours as needed for Pain, Disp-56 tablet, R-0Normal             02/16/2020, APRN - CNP   Emergency Medicine Nurse Practitioner    (Please note that portions of this note were completed with a voice recognition program.  Efforts were made to edit the dictations but occasionally words aremis-transcribed.)       Ellin Saba, APRN - CNP  02/14/20 1934

## 2020-02-14 NOTE — ED Provider Notes (Signed)
[facility] ED  [address]  [place] [address]  Phone: [phone]      Attending Physician Attestation    I performed a history and physical examination of the patient and discussed management with the mid level provider. I reviewed the mid level provider's note and agree with the documented findings and plan of care. Any areas of disagreement are noted on the chart. I was personally present for the key portions of any procedures. I have documented in the chart those procedures where I was not present during the key portions. I have reviewed the emergency nurses triage note. I agree with the chief complaint, past medical history, past surgical history, allergies, medications, social and family history as documented unless otherwise noted below. Documentation of the HPI, Physical Exam and Medical Decision Making performed by mid level providers is based on my personal performance of the HPI, PE and MDM. For Physician Assistant/ Nurse Practitioner cases/documentation I have personally evaluated this patient and have completed at least one if not all key elements of the E/M (history, physical exam, and MDM). Additional findings are as noted.      CHIEF COMPLAINT       Chief Complaint   Patient presents with   ??? Mouth Injury         HISTORY OF PRESENT ILLNESS    Carolyn Crane is a 44 y.o. female who presents for wound check.  The patient sustained a mouth injury approximately 5 days ago was seen at [facility] where she had a CT of the head CT of the facial bones and repair of her laceration was not placed on antibiotics believes she may have developed an infection was seen in this emergency department where she was placed on Peridex as well as Cleocin the patient is here today for a wound check as well as to have the outer sutures removed from her lower lip states she still has discomfort in that lower lip      PAST MEDICAL HISTORY    has a past medical history of Bipolar 1 disorder (HCC),  Bipolar 1 disorder (HCC), Depression, Diabetes mellitus (HCC), Fibromyalgia, Hyperlipidemia, IBS (irritable bowel syndrome), and PCOS (polycystic ovarian syndrome).    SURGICAL HISTORY      has a past surgical history that includes Appendectomy; Cholecystectomy; Tonsillectomy; Foot surgery (Left); and shoulder surgery (Left).    CURRENT MEDICATIONS       Previous Medications    ARIPIPRAZOLE (ABILIFY) 15 MG TABLET    Take 30 mg by mouth daily     ASPIRIN 81 MG EC TABLET    Take 1 tablet by mouth daily    ATORVASTATIN (LIPITOR) 80 MG TABLET    Take 80 mg by mouth daily    CHLORHEXIDINE (PERIDEX) 0.12 % SOLUTION    Take 15 mLs by mouth 2 times daily for 10 days    CLINDAMYCIN (CLEOCIN) 300 MG CAPSULE    Take 1 capsule by mouth 3 times daily for 7 days    ESCITALOPRAM (LEXAPRO) 10 MG TABLET    Take 10 mg by mouth daily     FENOFIBRATE 160 MG TABLET    Take 160 mg by mouth daily     FUROSEMIDE (LASIX) 20 MG TABLET    Take 1 tablet by mouth daily    GLIMEPIRIDE (AMARYL) 1 MG TABLET    Take 1 mg by mouth every morning (before breakfast)    INSULIN DEGLUDEC (TRESIBA FLEXTOUCH) 200 UNIT/ML SOPN  Inject 100 Units into the skin every morning     LANSOPRAZOLE (PREVACID) 30 MG DELAYED RELEASE CAPSULE    Take 1 capsule by mouth daily    LIRAGLUTIDE (VICTOZA SC)    Inject 1.8 Units into the skin daily     LORAZEPAM (ATIVAN) 0.5 MG TABLET    Take 0.5 mg by mouth 2 times daily as needed for Anxiety.    SUCRALFATE (CARAFATE) 1 GM TABLET    Take 1 tablet by mouth 4 times daily    SUMATRIPTAN (IMITREX) 50 MG TABLET    Take 1 tablet by mouth daily as needed for Migraine    VALACYCLOVIR (VALTREX) 500 MG TABLET    Take 1,000 mg by mouth 2 times daily as needed        ALLERGIES     is allergic to prochlorperazine, reglan [metoclopramide], and zofran [ondansetron hcl].    FAMILY HISTORY     She indicated that the status of her mother is unknown. She indicated that the status of her father is unknown. She indicated that the status of her  maternal grandmother is unknown. She indicated that the status of her maternal grandfather is unknown.     family history includes Cancer in her maternal grandfather, maternal grandmother, and mother; Stroke in her father, maternal grandmother, and mother.    SOCIAL HISTORY      reports that she has never smoked. She has never used smokeless tobacco. She reports previous alcohol use. She reports that she does not use drugs.    PHYSICAL EXAM     INITIAL VITALS:  height is 5\' 5"  (1.651 m) and weight is 95.7 kg (211 lb). Her oral temperature is 98.3 ??F (36.8 ??C). Her blood pressure is 138/82 and her pulse is 99. Her respiration is 18 and oxygen saturation is 98%.      The head is normocephalic patient is noted to have what appears to be a well-healing incision on the outer aspect of the lower lip on the inner aspect of the lower lip there is still some healing appreciated with that I did not appreciate a palpable abscess there is no overt bony line tenderness appreciated to the maxilla or mandible the neck is supple trachea midline no adenopathy no posterior neck pain skin on the exterior portion of the lip laceration appears to be well-healing on the internal aspect this appears to still be healing without any palpable abscess neurologic GCS is 15 and no focal deficits are appreciated      DIAGNOSTIC RESULTS         LABS:  No results found for this visit on 02/14/20.        EMERGENCY DEPARTMENT COURSE:   Vitals:    Vitals:    02/14/20 1841   BP: 138/82   Pulse: 99   Resp: 18   Temp: 98.3 ??F (36.8 ??C)   TempSrc: Oral   SpO2: 98%   Weight: 95.7 kg (211 lb)   Height: 5\' 5"  (1.651 m)     -------------------------  BP: 138/82, Temp: 98.3 ??F (36.8 ??C), Pulse: 99, Resp: 18      PERTINENT ATTENDING PHYSICIAN COMMENTS:    Patient presents for wound check given that her lower lip stitches were placed 5 days ago we will remove the stitches I'm recommending she continue the Cleocin and Peridex take Tylenol Motrin for pain return  to the ER for increased pain swelling fever or other concerns otherwise to follow-up with her family doctor within  the next few days      (Please note that portions of this note were completed with a voice recognition program.  Efforts were made to edit the dictations but occasionally words are mis-transcribed.)    Manfred Shirts, MD,, MD, F.A.C.E.P.  Attending Emergency Medicine Physician       Manfred Shirts, MD  02/14/20 [phone]

## 2020-03-09 ENCOUNTER — Emergency Department: Admit: 2020-03-10 | Payer: PRIVATE HEALTH INSURANCE | Primary: Nurse Practitioner

## 2020-03-09 DIAGNOSIS — J069 Acute upper respiratory infection, unspecified: Secondary | ICD-10-CM

## 2020-03-09 NOTE — Discharge Instructions (Signed)
Please understand that at this time there is no evidence for a more serious underlying process, but that early in the process of an illness or injury, an emergency department workup can be falsely reassuring.  You should contact your family doctor within the next 48 hours for a follow up appointment    THANK YOU!!!    From [facility]    On behalf of the Emergency Department staff at [facility], I would like to thank you for giving us the opportunity to address your health care needs and concerns.    We hope that during your visit, our service was delivered in a professional and caring manner. Please keep [facility] in mind as we walk with you down the path to your own personal wellness.     Please expect an automated text message or email from us so we can ask a few questions about your health and progress. Based on your answers, a clinician may call you back to offer help and instructions.    Please understand that early in the process of an illness or injury, an emergency department workup can be falsely reassuring.  If you notice any worsening, changing or persistent symptoms please call your family doctor or return to the ER immediately.     Tell us how we did during your visit at [email or web address]   and let us know about your experience

## 2020-03-09 NOTE — ED Provider Notes (Signed)
[facility] ED  [address] [place] [address]  Phone: [phone]        Pt Name: Carolyn Crane  MRN: [id number]  Birthdate 1976-04-04  Date of evaluation: 03/09/20      CHIEF COMPLAINT     Chief Complaint   Patient presents with   ??? Cough     productive   ??? Pharyngitis   ??? Otalgia     rt ear   ??? Fatigue         HISTORY OF PRESENT ILLNESS  (Location/Symptom, Timing/Onset, Context/Setting, Quality, Duration, Modifying Factors, Severity.)    Carolyn Crane is a 44 y.o. female who presents with a 3 day history of sore throat, nasal congestion, cough, fatigue, and right ear pain. Her cough is productive of green sputum. She denies fever or chills. She does have myalgias. No headache. She feels like she has been waking up, gasping for air. She is mildly short of breath. No chest pain. She is a non-smoker. No history of lung disease. She is not vaccinated for Covid-19. She states her niece and nephew have croup. She states she has been taking tylenol, motrin, aleve, and mucinex.       REVIEW OF SYSTEMS    (2-9 systems for level 4, 10 or more for level 5)     Review of Systems   Constitutional: Negative for chills and fever.   HENT: Positive for congestion, ear pain and sore throat.    Eyes: Negative for photophobia and visual disturbance.   Respiratory: Positive for cough and shortness of breath. Negative for chest tightness.    Cardiovascular: Negative for chest pain and palpitations.   Gastrointestinal: Negative for abdominal pain, diarrhea, nausea and vomiting.   Genitourinary: Negative for dysuria, frequency and urgency.   Musculoskeletal: Positive for myalgias. Negative for back pain and neck pain.   Skin: Negative for rash and wound.   Neurological: Negative for dizziness, light-headedness and headaches.   Hematological: Negative for adenopathy. Does not bruise/bleed easily.       PAST MEDICAL HISTORY    has a past medical history of Bipolar 1 disorder (HCC), Bipolar 1 disorder (HCC), Depression,  Diabetes mellitus (HCC), Fibromyalgia, Hyperlipidemia, IBS (irritable bowel syndrome), and PCOS (polycystic ovarian syndrome).    SURGICAL HISTORY      has a past surgical history that includes Appendectomy; Cholecystectomy; Tonsillectomy; Foot surgery (Left); and shoulder surgery (Left).    CURRENTMEDICATIONS       Previous Medications    ACETAMINOPHEN (TYLENOL) 500 MG TABLET    Take 1 tablet by mouth every 6 hours as needed for Pain    ARIPIPRAZOLE (ABILIFY) 15 MG TABLET    Take 30 mg by mouth daily     ASPIRIN 81 MG EC TABLET    Take 1 tablet by mouth daily    ATORVASTATIN (LIPITOR) 80 MG TABLET    Take 80 mg by mouth daily    ESCITALOPRAM (LEXAPRO) 10 MG TABLET    Take 10 mg by mouth daily     FENOFIBRATE 160 MG TABLET    Take 160 mg by mouth daily     FUROSEMIDE (LASIX) 20 MG TABLET    Take 1 tablet by mouth daily    GLIMEPIRIDE (AMARYL) 1 MG TABLET    Take 1 mg by mouth every morning (before breakfast)    IBUPROFEN (ADVIL;MOTRIN) 800 MG TABLET    Take 1 tablet by mouth every 8 hours as needed for Pain  INSULIN DEGLUDEC (TRESIBA FLEXTOUCH) 200 UNIT/ML SOPN    Inject 100 Units into the skin every morning     LANSOPRAZOLE (PREVACID) 30 MG DELAYED RELEASE CAPSULE    Take 1 capsule by mouth daily    LIRAGLUTIDE (VICTOZA SC)    Inject 1.8 Units into the skin daily     LORAZEPAM (ATIVAN) 0.5 MG TABLET    Take 0.5 mg by mouth 2 times daily as needed for Anxiety.    SUCRALFATE (CARAFATE) 1 GM TABLET    Take 1 tablet by mouth 4 times daily    SUMATRIPTAN (IMITREX) 50 MG TABLET    Take 1 tablet by mouth daily as needed for Migraine    VALACYCLOVIR (VALTREX) 500 MG TABLET    Take 1,000 mg by mouth 2 times daily as needed        ALLERGIES     is allergic to prochlorperazine, reglan [metoclopramide], and zofran [ondansetron hcl].    FAMILY HISTORY     She indicated that the status of her mother is unknown. She indicated that the status of her father is unknown. She indicated that the status of her maternal grandmother is  unknown. She indicated that the status of her maternal grandfather is unknown.     family history includes Cancer in her maternal grandfather, maternal grandmother, and mother; Stroke in her father, maternal grandmother, and mother.    SOCIAL HISTORY      reports that she has never smoked. She has never used smokeless tobacco. She reports previous alcohol use. She reports that she does not use drugs.    PHYSICAL EXAM    (up to 7 for level 4, 8 or more for level 5)   INITIAL VITALS:  oral temperature is 98 ??F (36.7 ??C). Her blood pressure is 138/72 and her pulse is 84. Her respiration is 18 and oxygen saturation is 97%.      Physical Exam  Vitals and nursing note reviewed.   Constitutional:       Appearance: She is well-developed. She is ill-appearing.   HENT:      Head: Normocephalic and atraumatic.      Right Ear: Tympanic membrane and ear canal normal. No drainage, swelling or tenderness. No middle ear effusion. Tympanic membrane is not erythematous.      Left Ear: Tympanic membrane and ear canal normal. No drainage, swelling or tenderness.  No middle ear effusion. Tympanic membrane is not erythematous.      Nose: Congestion present.      Mouth/Throat:      Mouth: Mucous membranes are moist. No oral lesions.      Pharynx: No posterior oropharyngeal erythema.      Tonsils: No tonsillar exudate or tonsillar abscesses.   Eyes:      Conjunctiva/sclera: Conjunctivae normal.      Pupils: Pupils are equal, round, and reactive to light.   Cardiovascular:      Rate and Rhythm: Normal rate and regular rhythm.      Heart sounds: No murmur heard.  Friction rub present. No gallop.    Pulmonary:      Effort: Pulmonary effort is normal.      Breath sounds: Normal breath sounds. No wheezing, rhonchi or rales.   Abdominal:      General: Bowel sounds are normal.      Palpations: Abdomen is soft.   Musculoskeletal:      Cervical back: Normal range of motion and neck supple.   Skin:  General: Skin is warm and dry.      Capillary  Refill: Capillary refill takes less than 2 seconds.   Neurological:      General: No focal deficit present.      Mental Status: She is alert and oriented to person, place, and time.   Psychiatric:         Mood and Affect: Mood normal.         Behavior: Behavior normal.         DIFFERENTIAL DIAGNOSIS/ MDM:     44 year old female presents with a probable viral syndrome.  Chest x-ray is negative for infiltrate.  No underlying lung disease.  Lungs are clear on exam.  Oxygen saturation is normal.  She has known sick contacts with probable parainfluenza virus.  Plan for supportive care.    The patient understands that at this time there is no evidence for a more malignant underlying process, but the patient also understands that early in the process of an illness or injury, an emergency department workup can be falsely reassuring.  Routine discharge counseling was given, and the patient understands that worsening, changing or persistent symptoms should prompt an immediate call or follow up with their primary physician or return to the emergency department. The importance of appropriate follow up was also discussed.  I have reviewed the disposition diagnosis with the patient and or their family/guardian.  I have answered their questions and given discharge instructions.  They voiced understanding of these instructions and did not have any further questions or complaints.          DIAGNOSTIC RESULTS     EKG: All EKG's are interpreted by the Emergency Department Physician who either signs or Co-signs this chart in the absence of a cardiologist.    none    RADIOLOGY:        Interpretation per the Radiologist below, if available at the time of this note:    XR CHEST (2 VW)    Result Date: 03/09/2020  EXAMINATION: TWO XRAY VIEWS OF THE CHEST 03/09/2020 8:13 pm COMPARISON: 11/15/2019 HISTORY: ORDERING SYSTEM PROVIDED HISTORY: cough TECHNOLOGIST PROVIDED HISTORY: cough Reason for Exam: Pt c/o cough, fatigue and sore throat  Acuity: Acute Type of Exam: Initial Relevant Medical/Surgical History: diabetes FINDINGS: The heart size is normal.  There is no pneumothorax, edema or focal consolidation.  The bony thorax is grossly intact.     No evidence of acute cardiopulmonary disease.       LABS:  No results found for this visit on 03/09/20.    The patient declined a covid test.     EMERGENCY DEPARTMENT COURSE:   Vitals:    Vitals:    03/09/20 1950   BP: 138/72   Pulse: 84   Resp: 18   Temp: 98 ??F (36.7 ??C)   TempSrc: Oral   SpO2: 97%     -------------------------  BP: 138/72, Temp: 98 ??F (36.7 ??C), Pulse: 84, Resp: 18      RE-EVALUATION:  8:57 PM EST  Patient updated on test results. She reports that she mostly would just like a prescription for cough medicine so she can use her insurance to get it since she doesn't have cash. She states tessalon pearls and albuterol don't seem to work well for her. I sent a script for Robitussin to her pharmacy.     CONSULTS:  none    PROCEDURES:  None    FINAL IMPRESSION      1. Upper respiratory tract  infection, unspecified type          DISPOSITION/PLAN   DISPOSITION        CONDITION ON DISPOSITION:   Stable     PATIENT REFERRED TO:  Stoney Bang, APRN - CNP  [address]  [place] [address]  [phone]    Schedule an appointment as soon as possible for a visit in 2 days        DISCHARGE MEDICATIONS:  New Prescriptions    DEXTROMETHORPHAN (ROBITUSSIN 12 HOUR COUGH) 30 MG/5ML EXTENDED RELEASE LIQUID    Take 10 mLs by mouth 2 times daily as needed for Cough       (Please note that portions of this note were completed with a voicerecognition program.  Efforts were made to edit the dictations but occasionally words are mis-transcribed.)    Chalmers Guest, MD  Attending Emergency Medicine Physician        Chalmers Guest, MD  03/09/20 2100

## 2020-03-10 ENCOUNTER — Inpatient Hospital Stay
Admit: 2020-03-10 | Discharge: 2020-03-10 | Disposition: A | Discharge status: Home / Self Care | Payer: PRIVATE HEALTH INSURANCE | Attending: Emergency Medicine

## 2020-03-10 MED ORDER — GUAIFENESIN 100 MG/5ML PO SOLN
100 MG/5ML | Freq: Once | ORAL | Status: AC
Start: 2020-03-10 — End: 2020-03-09
  Administered 2020-03-10: 02:00:00 200 mg via ORAL

## 2020-03-10 MED ORDER — DEXTROMETHORPHAN POLISTIREX ER 30 MG/5ML PO SUER
305 MG/5ML | Freq: Two times a day (BID) | ORAL | 0 refills | Status: AC | PRN
Start: 2020-03-10 — End: 2020-03-19

## 2020-03-10 MED ORDER — BENZONATATE 100 MG PO CAPS
100 MG | Freq: Once | ORAL | Status: DC
Start: 2020-03-10 — End: 2020-03-09

## 2020-03-10 MED FILL — BENZONATATE 100 MG PO CAPS: 100 mg | ORAL | Qty: 2

## 2020-03-10 MED FILL — GUAIFENESIN 100 MG/5ML PO LIQD: 100 MG/5ML | ORAL | Qty: 10

## 2020-03-13 DIAGNOSIS — J069 Acute upper respiratory infection, unspecified: Secondary | ICD-10-CM

## 2020-03-13 NOTE — ED Notes (Signed)
Pt presents to the ED via private auto. Pt states she has been experiencing a harsh cough x1 week. Pt also c/o shortness of breath associated with cough. Pt was seen here previously for same     Burna Forts, RN  03/14/20 [phone]

## 2020-03-13 NOTE — ED Provider Notes (Signed)
eMERGENCY dEPARTMENT eNCOUnter      Pt Name: Carolyn Crane  MRN: [id number]  Birthdate 25-Mar-1976  Date of evaluation: 03/13/2020      CHIEF COMPLAINT       Chief Complaint   Patient presents with   ??? Cough     x1 week   ??? Shortness of Breath         HISTORY OF PRESENT ILLNESS    Carolyn Crane is a 44 y.o. female who presents with cough and shortness of breath patient states been having a cough for the last week no chest pain no fever chills no other exacerbating relieving factors        REVIEW OF SYSTEMS       Review of systems are all reviewed and negative except stated above in HPI    PAST MEDICAL HISTORY    has a past medical history of Bipolar 1 disorder (HCC), Bipolar 1 disorder (HCC), Depression, Diabetes mellitus (HCC), Fibromyalgia, Hyperlipidemia, IBS (irritable bowel syndrome), and PCOS (polycystic ovarian syndrome).    SURGICAL HISTORY      has a past surgical history that includes Appendectomy; Cholecystectomy; Tonsillectomy; Foot surgery (Left); and shoulder surgery (Left).    CURRENT MEDICATIONS       Discharge Medication List as of 03/14/2020 12:28 AM      CONTINUE these medications which have NOT CHANGED    Details   Insulin Detemir (LEVEMIR SC) Inject 110 Units into the skin dailyHistorical Med      dextromethorphan (ROBITUSSIN 12 HOUR COUGH) 30 MG/5ML extended release liquid Take 10 mLs by mouth 2 times daily as needed for Cough, Disp-148 mL, R-0Normal      aspirin 81 MG EC tablet Take 1 tablet by mouth daily, Disp-30 tablet, R-3Normal      glimepiride (AMARYL) 1 MG tablet Take 1 mg by mouth every morning (before breakfast)Historical Med      LORazepam (ATIVAN) 0.5 MG tablet Take 0.5 mg by mouth 2 times daily as needed for Anxiety.Historical Med      escitalopram (LEXAPRO) 10 MG tablet Take 10 mg by mouth daily Historical Med      Liraglutide (VICTOZA SC) Inject 1.8 Units into the skin daily Historical Med      atorvastatin (LIPITOR) 80 MG tablet Take 80 mg by mouth dailyHistorical Med       fenofibrate 160 MG tablet Take 160 mg by mouth daily Historical Med      ARIPiprazole (ABILIFY) 15 MG tablet Take 30 mg by mouth daily Historical Med      acetaminophen (TYLENOL) 500 MG tablet Take 1 tablet by mouth every 6 hours as needed for Pain, Disp-56 tablet, R-0Normal      ibuprofen (ADVIL;MOTRIN) 800 MG tablet Take 1 tablet by mouth every 8 hours as needed for Pain, Disp-42 tablet, R-0Normal      lansoprazole (PREVACID) 30 MG delayed release capsule Take 1 capsule by mouth daily, Disp-30 capsule, R-0Print      sucralfate (CARAFATE) 1 GM tablet Take 1 tablet by mouth 4 times daily, Disp-120 tablet, R-0Print      furosemide (LASIX) 20 MG tablet Take 1 tablet by mouth daily, Disp-5 tablet, R-0Print      SUMAtriptan (IMITREX) 50 MG tablet Take 1 tablet by mouth daily as needed for Migraine, Disp-18 tablet, R-1Normal      valACYclovir (VALTREX) 500 MG tablet Take 1,000 mg by mouth 2 times daily as needed Historical Med      Insulin Degludec (TRESIBA FLEXTOUCH)  200 UNIT/ML SOPN Inject 100 Units into the skin every morning Historical Med             ALLERGIES     is allergic to prochlorperazine, reglan [metoclopramide], and zofran [ondansetron hcl].    FAMILY HISTORY     She indicated that the status of her mother is unknown. She indicated that the status of her father is unknown. She indicated that the status of her maternal grandmother is unknown. She indicated that the status of her maternal grandfather is unknown.     family history includes Cancer in her maternal grandfather, maternal grandmother, and mother; Stroke in her father, maternal grandmother, and mother.    SOCIAL HISTORY      reports that she has never smoked. She has never used smokeless tobacco. She reports previous alcohol use. She reports that she does not use drugs.    PHYSICAL EXAM     INITIAL VITALS:  height is 5\' 5"  (1.651 m) and weight is 215 lb (97.5 kg). Her oral temperature is 97.6 ??F (36.4 ??C). Her blood pressure is 132/80 and her  pulse is 84. Her respiration is 15 and oxygen saturation is 97%.      General: Patient alert nontoxic-appearing female in no apparent distress  HEENT: Head is atraumatic conjunctiva are clear or mucosa pink and moist  Neck: Supple  Respiratory: Lung sounds are clear bilateral slightly diminished throughout however  Cardiac: Heart is regular rate and rhythm  Neuro: Patient has no gross focal neurological deficits at bedside exam    DIFFERENTIAL DIAGNOSIS/ MDM:     URI bronchitis    DIAGNOSTIC RESULTS     EKG: All EKG's are interpreted by the Emergency Department Physician who either signs or Co-signs this chart in the absence of a cardiologist.        RADIOLOGY:   I directly visualized the following  images and reviewed the radiologist interpretations:  No orders to display         LABS:  Labs Reviewed - No data to display      EMERGENCY DEPARTMENT COURSE:   Vitals:    Vitals:    03/13/20 2337   BP: 132/80   Pulse: 84   Resp: 15   Temp: 97.6 ??F (36.4 ??C)   TempSrc: Oral   SpO2: 97%   Weight: 215 lb (97.5 kg)   Height: 5\' 5"  (1.651 m)     -------------------------  BP: 132/80, Temp: 97.6 ??F (36.4 ??C), Pulse: 84, Resp: 15    Orders Placed This Encounter   Medications   ??? albuterol (PROVENTIL) nebulizer solution 2.5 mg     Order Specific Question:   Initiate RT Bronchodilator Protocol     Answer:   No   ??? benzonatate (TESSALON) capsule 100 mg   ??? benzonatate (TESSALON PERLES) 100 MG capsule     Sig: Take 1 capsule by mouth 3 times daily as needed for Cough     Dispense:  30 capsule     Refill:  0           Re-evaluation Notes    Patient has no clinical findings for acute cardiac disease no indications for bacterial infectious disease she was given albuterol with minimal relief she still have an occasional dry nonproductive cough will start on Tessalon follow-up as directed return if worse    CRITICAL CARE:   None      CONSULTS:    PROCEDURES:  None    FINAL IMPRESSION  1. Upper respiratory tract infection,  unspecified type          DISPOSITION/PLAN   DISPOSITION Decision To Discharge 03/14/2020 12:26:53 AM      Condition on Disposition    Stable    PATIENT REFERRED TO:  Stoney Bang, APRN - CNP  [address]  [place] [address]  [phone]    In 2 days        DISCHARGE MEDICATIONS:  Discharge Medication List as of 03/14/2020 12:28 AM      START taking these medications    Details   benzonatate (TESSALON PERLES) 100 MG capsule Take 1 capsule by mouth 3 times daily as needed for Cough, Disp-30 capsule, R-0Normal             (Please note that portions of this note were completed with a voice recognition program.  Efforts were made to edit the dictations but occasionally words are mis-transcribed.)    Jacki Cones, MD,, MD, F.A.C.E.P.  Attending Emergency Physician       Jacki Cones, MD  03/15/20 [phone]

## 2020-03-14 ENCOUNTER — Inpatient Hospital Stay
Admit: 2020-03-14 | Discharge: 2020-03-14 | Disposition: A | Discharge status: Home / Self Care | Payer: PRIVATE HEALTH INSURANCE | Attending: Emergency Medicine

## 2020-03-14 MED ORDER — BENZONATATE 100 MG PO CAPS
100 MG | Freq: Once | ORAL | Status: AC
Start: 2020-03-14 — End: 2020-03-14
  Administered 2020-03-14: 06:00:00 100 mg via ORAL

## 2020-03-14 MED ORDER — ALBUTEROL SULFATE (2.5 MG/3ML) 0.083% IN NEBU
Freq: Once | RESPIRATORY_TRACT | Status: AC
Start: 2020-03-14 — End: 2020-03-14
  Administered 2020-03-14: 05:00:00 2.5 mg via RESPIRATORY_TRACT

## 2020-03-14 MED ORDER — BENZONATATE 100 MG PO CAPS
100 MG | ORAL_CAPSULE | Freq: Three times a day (TID) | ORAL | 0 refills | Status: AC | PRN
Start: 2020-03-14 — End: 2020-03-24

## 2020-03-14 MED FILL — ALBUTEROL SULFATE (2.5 MG/3ML) 0.083% IN NEBU: RESPIRATORY_TRACT | Qty: 3

## 2020-03-14 MED FILL — BENZONATATE 100 MG PO CAPS: 100 mg | ORAL | Qty: 1

## 2020-05-06 ENCOUNTER — Emergency Department: Admit: 2020-05-07 | Payer: PRIVATE HEALTH INSURANCE | Primary: Nurse Practitioner

## 2020-05-06 DIAGNOSIS — U071 COVID-19: Secondary | ICD-10-CM

## 2020-05-06 NOTE — ED Provider Notes (Signed)
[facility] ED  [address] [place] [address]  Phone: [phone]      Pt Name: Carolyn Crane  MRN: [id number]  Birthdate 13-Sep-1975  Date of evaluation: 05/06/2020      CHIEF COMPLAINT       Chief Complaint   Patient presents with   ??? Cough   ??? Shortness of Breath   ??? Positive For Covid-19         HISTORY OF PRESENT ILLNESS    Carolyn Crane is a 45 y.o. female who presents   Chief Complaint   Patient presents with   ??? Cough   ??? Shortness of Breath   ??? Positive For Covid-76   .    45 year old female patient presents to the emergency department for evaluation of nonproductive cough and shortness of breath.  She has been taking Tessalon Perles without any relief.  She tested positive for COVID infection 7 days ago at [facility] urgent care.  She is unvaccinated for COVID infection.  She had generalized body ache, low-grade fever and chills and went to the urgent care 7 days ago since her boyfriend had similar symptoms and patient suspects that he had COVID infection.  She has bilateral rib cage pain and upper abdominal pain mostly with the cough and grades pain as 8 out of 10 in intensity.  She denies any nausea or vomiting but has had first episode of diarrhea tonight.  There are no exacerbating or relieving factors.    REVIEW OF SYSTEMS       Review of Systems    All systems reviewed and negative unless noted in HPI.  The patient admits to low-grade fever and chills  Denies vision change.   Denies any sore throat or rhinorrhea.    Denies any neck pain or stiffness.    Admits to cough, shortness of breath, rib cage pain and upper abdominal pain with the cough  No nausea,  Vomiting, has had first episode of diarrhea tonight.     Denies any dysuria.  Denies urinary frequency or hematuria.    Denies any weakness, numbness or focal neurologic deficit.    Denies any skin rash or edema.    No recent psychiatric issues.   No easy bruising or bleeding.   Denies any polyuria, polydypsia       PAST MEDICAL  HISTORY    has a past medical history of Bipolar 1 disorder (HCC), Bipolar 1 disorder (HCC), Depression, Diabetes mellitus (HCC), Fibromyalgia, Hyperlipidemia, IBS (irritable bowel syndrome), and PCOS (polycystic ovarian syndrome).    SURGICAL HISTORY      has a past surgical history that includes Appendectomy; Cholecystectomy; Tonsillectomy; Foot surgery (Left); and shoulder surgery (Left).    CURRENT MEDICATIONS       Previous Medications    ACETAMINOPHEN (TYLENOL) 500 MG TABLET    Take 1 tablet by mouth every 6 hours as needed for Pain    ARIPIPRAZOLE (ABILIFY) 15 MG TABLET    Take 30 mg by mouth daily     ASPIRIN 81 MG EC TABLET    Take 1 tablet by mouth daily    ATORVASTATIN (LIPITOR) 80 MG TABLET    Take 80 mg by mouth daily    ESCITALOPRAM (LEXAPRO) 10 MG TABLET    Take 10 mg by mouth daily     FENOFIBRATE 160 MG TABLET    Take 160 mg by mouth daily     FUROSEMIDE (LASIX) 20 MG TABLET  Take 1 tablet by mouth daily    GLIMEPIRIDE (AMARYL) 1 MG TABLET    Take 1 mg by mouth every morning (before breakfast)    IBUPROFEN (ADVIL;MOTRIN) 800 MG TABLET    Take 1 tablet by mouth every 8 hours as needed for Pain    INSULIN DEGLUDEC (TRESIBA FLEXTOUCH) 200 UNIT/ML SOPN    Inject 100 Units into the skin every morning     INSULIN DETEMIR (LEVEMIR SC)    Inject 110 Units into the skin daily    LANSOPRAZOLE (PREVACID) 30 MG DELAYED RELEASE CAPSULE    Take 1 capsule by mouth daily    LIRAGLUTIDE (VICTOZA SC)    Inject 1.8 Units into the skin daily     LORAZEPAM (ATIVAN) 0.5 MG TABLET    Take 0.5 mg by mouth 2 times daily as needed for Anxiety.    SUCRALFATE (CARAFATE) 1 GM TABLET    Take 1 tablet by mouth 4 times daily    SUMATRIPTAN (IMITREX) 50 MG TABLET    Take 1 tablet by mouth daily as needed for Migraine    VALACYCLOVIR (VALTREX) 500 MG TABLET    Take 1,000 mg by mouth 2 times daily as needed        ALLERGIES     is allergic to prochlorperazine, reglan [metoclopramide], and zofran [ondansetron hcl].    FAMILY HISTORY      She indicated that the status of her mother is unknown. She indicated that the status of her father is unknown. She indicated that the status of her maternal grandmother is unknown. She indicated that the status of her maternal grandfather is unknown.     family history includes Cancer in her maternal grandfather, maternal grandmother, and mother; Stroke in her father, maternal grandmother, and mother.    SOCIAL HISTORY      reports that she has never smoked. She has never used smokeless tobacco. She reports previous alcohol use. She reports that she does not use drugs.    PHYSICAL EXAM     INITIAL VITALS:  height is 5\' 5"  (1.651 m) and weight is 90.7 kg (200 lb). Her temperature is 99 ??F (37.2 ??C). Her blood pressure is 120/69 and her pulse is 96. Her respiration is 20 and oxygen saturation is 96%.     Physical Exam  Vitals and nursing note reviewed.   Constitutional:       Appearance: She is well-developed.   HENT:      Head: Normocephalic and atraumatic.      Nose: Nose normal.   Eyes:      Extraocular Movements: Extraocular movements intact.      Pupils: Pupils are equal, round, and reactive to light.   Cardiovascular:      Rate and Rhythm: Normal rate and regular rhythm.      Heart sounds: Normal heart sounds. No murmur heard.      Pulmonary:      Effort: Pulmonary effort is normal. No respiratory distress.      Breath sounds: Normal breath sounds.   Abdominal:      General: Bowel sounds are normal. There is no distension.      Palpations: Abdomen is soft.      Tenderness: There is no abdominal tenderness.   Musculoskeletal:      Cervical back: Normal range of motion and neck supple.   Skin:     General: Skin is warm and dry.   Neurological:      General: No focal  deficit present.      Mental Status: She is alert and oriented to person, place, and time.           DIFFERENTIAL DIAGNOSIS/ MDM:     COVID-pneumonia, viral URI with cough    DIAGNOSTIC RESULTS     EKG: All EKG's are interpreted by the Emergency  Department Physician who either signs or Co-signs this chart in the absence of a cardiologist.    None obtained    RADIOLOGY:   I reviewed the radiologist interpretations:  XR CHEST PORTABLE   Final Result   Minimal left basilar strandy densities with morphology suggestive of   subsegmental atelectasis, though clinical correlation is required.             XR CHEST PORTABLE    Result Date: 05/06/2020  EXAMINATION: ONE XRAY VIEW OF THE CHEST 05/06/2020 8:22 pm COMPARISON: 03/09/2020, 11/15/2019, 10/30/2019 HISTORY: ORDERING SYSTEM PROVIDED HISTORY: Cough, SOB, Covid positive TECHNOLOGIST PROVIDED HISTORY: Cough, SOB, Covid positive Reason for Exam: cough, dyspnea, covid+ FINDINGS: Minimal strandy densities at the left lung base with a bandlike opacity in the retrocardiac left lower lobe.  No pneumothorax or pleural effusion. Cardiomediastinal contours are within normal limits.  No acute bony findings.     Minimal left basilar strandy densities with morphology suggestive of subsegmental atelectasis, though clinical correlation is required.         LABS:  Labs Reviewed - No data to display      EMERGENCY DEPARTMENT COURSE:   Vitals:    Vitals:    05/06/20 2304   BP: 120/69   Pulse: 96   Resp: 20   Temp: 99 ??F (37.2 ??C)   SpO2: 96%   Weight: 90.7 kg (200 lb)   Height: 5\' 5"  (1.651 m)     -------------------------  BP: 120/69, Temp: 99 ??F (37.2 ??C), Pulse: 96, Resp: 20    Orders Placed This Encounter   Medications   ??? acetaminophen (TYLENOL) tablet 650 mg   ??? DISCONTD: albuterol sulfate HFA 108 (90 Base) MCG/ACT inhaler 2 puff     Order Specific Question:   Initiate RT Bronchodilator Protocol     Answer:   Yes   ??? azithromycin (ZITHROMAX) tablet 500 mg     Order Specific Question:   Antimicrobial Indications     Answer:   Upper Respiratory Infection   ??? guaiFENesin-codeine (TUSSI-ORGANIDIN NR) 100-10 MG/5ML syrup     Sig: Take 5 mLs by mouth 3 times daily as needed for Cough for up to 14 days.     Dispense:  180 mL      Refill:  0   ??? azithromycin (ZITHROMAX) 250 MG tablet     Sig: Take 1 tablet by mouth daily     Dispense:  4 tablet     Refill:  0   ??? albuterol sulfate HFA 108 (90 Base) MCG/ACT inhaler 2 puff     Order Specific Question:   Initiate RT Bronchodilator Protocol     Answer:   Yes       During the emergency department course, patient was given Tylenol 650 mg orally upon arrival and albuterol inhaler 2 puffs during the ED stay.  She was also given Zithromax 500 milligrams orally.  Patient is resting comfortably and does not appear to be in any pain or distress.  She maintained her pulse oximetry 96% on room air.  She was seen at [facility] urgent care and she was offered monoclonal antibody therapy which  she accepted but she has not received a call for scheduling.  She was advised to call tomorrow morning to schedule for monoclonal antibody therapy as an outpatient, azithromycin for 4 days, albuterol inhaler as needed for the shortness of breath or wheezing, Tussi-Organidin cough syrup as needed for the cough, plenty of oral fluids, follow-up with PCP, return if worse.  She is advised to monitor pulse oximetry closely at home and take Tylenol as needed for the fever.  She is advised to call us if any questions, concerns or problems.    I have reviewed the disposition diagnosis with the patient and or their family/guardian. I have answered their questions and given discharge instructions. They voiced understanding of these instructions and did not have any further questions or complaints.    Re-evaluation Notes    Patient is resting comfortably and does not appear to be in any pain or distress prior to discharge      PROCEDURES:  None    FINAL IMPRESSION      1. COVID-19 virus infection    2. Linear atelectasis          DISPOSITION/PLAN   DISPOSITION Decision To Discharge 05/06/2020 11:56:44 PM      Condition on Disposition    Stable    PATIENT REFERRED TO:  Stoney BangJoanna Shaw, APRN - CNP  [address]  [place]  [id number]  [phone]    Call in 3 days  For reevaluation of current symptoms    [facility] [place] [facility]  [address]  [place] [id number]  [phone]    If symptoms worsen      DISCHARGE MEDICATIONS:  New Prescriptions    AZITHROMYCIN (ZITHROMAX) 250 MG TABLET    Take 1 tablet by mouth daily    GUAIFENESIN-CODEINE (TUSSI-ORGANIDIN NR) 100-10 MG/5ML SYRUP    Take 5 mLs by mouth 3 times daily as needed for Cough for up to 14 days.       (Please note that portions of this note were completed with a voice recognition program.  Efforts were made to edit the dictations but occasionally words are mis-transcribed.)    Lenice PressmanAshok N Baer Hinton, MD,, MD, F.A.C.E.P.  Attending Emergency Physician     Lenice PressmanAshok N Othman Masur, MD  05/07/20 [phone]

## 2020-05-07 ENCOUNTER — Inpatient Hospital Stay
Admit: 2020-05-07 | Discharge: 2020-05-07 | Disposition: A | Discharge status: Home / Self Care | Payer: PRIVATE HEALTH INSURANCE | Attending: Specialist

## 2020-05-07 MED ORDER — ALBUTEROL SULFATE HFA 108 (90 BASE) MCG/ACT IN AERS
108 (90 Base) MCG/ACT | Freq: Four times a day (QID) | RESPIRATORY_TRACT | Status: DC | PRN
Start: 2020-05-07 — End: 2020-05-07

## 2020-05-07 MED ORDER — ACETAMINOPHEN 325 MG PO TABS
325 MG | Freq: Once | ORAL | Status: AC
Start: 2020-05-07 — End: 2020-05-06
  Administered 2020-05-07: 05:00:00 via ORAL

## 2020-05-07 MED ORDER — GUAIFENESIN-CODEINE 100-10 MG/5ML PO SYRP
100-105 MG/5ML | Freq: Three times a day (TID) | ORAL | 0 refills | Status: AC | PRN
Start: 2020-05-07 — End: 2020-05-21

## 2020-05-07 MED ORDER — ALBUTEROL SULFATE HFA 108 (90 BASE) MCG/ACT IN AERS
10890 (90 Base) MCG/ACT | Freq: Once | RESPIRATORY_TRACT | Status: AC
Start: 2020-05-07 — End: 2020-05-07
  Administered 2020-05-07: 05:00:00 via RESPIRATORY_TRACT

## 2020-05-07 MED ORDER — AZITHROMYCIN 250 MG PO TABS
250 MG | ORAL_TABLET | Freq: Every day | ORAL | 0 refills | Status: AC
Start: 2020-05-07 — End: ?

## 2020-05-07 MED ORDER — AZITHROMYCIN 250 MG PO TABS
250 MG | Freq: Once | ORAL | Status: AC
Start: 2020-05-07 — End: 2020-05-07
  Administered 2020-05-07: 05:00:00 via ORAL

## 2020-05-07 MED FILL — AZITHROMYCIN 250 MG PO TABS: 250 mg | ORAL | Qty: 2

## 2020-05-07 MED FILL — PROVENTIL HFA 108 (90 BASE) MCG/ACT IN AERS: 108 (90 Base) MCG/ACT | RESPIRATORY_TRACT | Qty: 6.7

## 2020-05-07 MED FILL — ACETAMINOPHEN 325 MG PO TABS: 325 mg | ORAL | Qty: 2

## 2020-05-07 NOTE — Discharge Instructions (Signed)
Please understand that at this time there is no evidence for a more serious underlying process, but that early in the process of an illness or injury, an emergency department workup can be falsely reassuring.  You should contact your family doctor within the next 48 hours for a follow up appointment    THANK YOU!!!    From [facility] and [facility]    On behalf of the Emergency Department staff at [facility], I would like to thank you for giving us the opportunity to address your health care needs and concerns.    We hope that during your visit, our service was delivered in a professional and caring manner. Please keep [facility] in mind as we walk with you down the path to your own personal wellness.     Please expect an automated text message or email from us so we can ask a few questions about your health and progress. Based on your answers, a clinician may call you back to offer help and instructions.    Please understand that early in the process of an illness or injury, an emergency department workup can be falsely reassuring.  If you notice any worsening, changing or persistent symptoms please call your family doctor or return to the ER immediately.     Tell us how we did during your visit at [email or web address]   and let us know about your experience

## 2020-05-23 ENCOUNTER — Inpatient Hospital Stay
Admit: 2020-05-23 | Discharge: 2020-05-23 | Disposition: A | Discharge status: Home / Self Care | Payer: PRIVATE HEALTH INSURANCE | Attending: Emergency Medicine

## 2020-05-23 ENCOUNTER — Emergency Department: Admit: 2020-05-23 | Payer: PRIVATE HEALTH INSURANCE | Primary: Nurse Practitioner

## 2020-05-23 DIAGNOSIS — J069 Acute upper respiratory infection, unspecified: Secondary | ICD-10-CM

## 2020-05-23 MED ORDER — GUAIFENESIN-CODEINE 100-10 MG/5ML PO SYRP
100-10 MG/5ML | Freq: Three times a day (TID) | ORAL | 0 refills | Status: AC | PRN
Start: 2020-05-23 — End: 2020-05-26

## 2020-05-23 NOTE — Discharge Instructions (Signed)
Follow this medication plan for 3 days to control your pain: (warning - be mindful of over-the-counter medications that contain Tylenol.  9am Tylenol 1000 mg  12noon Ibuprofen 800 mg  3pm Tylenol 1000 mg  6pm Ibuprofen 800 mg    Return to this emergency room immediately if your symptoms persist, worsen or if new ones form.    Make sure you follow-up with your primary care doctor within the next 1-2 business days.

## 2020-05-23 NOTE — ED Provider Notes (Signed)
EMERGENCY DEPARTMENT ENCOUNTER    Pt Name: Carolyn Crane  MRN: [id number]  Birthdate 1975/11/04  Date of evaluation: 05/23/20  CHIEF COMPLAINT       Chief Complaint   Patient presents with   ??? Cough     pt was covid positive 04/29/2020   ??? Nasal Congestion     HISTORY OF PRESENT ILLNESS   Patient is a 45 year old female just recovered from COVID-19 illness, who presents to the ED for 3 days of runny nose, nasal congestion and cough.  No other issues at this time.      ROS:  No shortness of breath, chest pain, abdominal pain, nausea, vomiting, changes in urine or stool.    REVIEW OF SYSTEMS     Review of Systems   All other systems reviewed and are negative.    PASTMEDICAL HISTORY     Past Medical History:   Diagnosis Date   ??? Bipolar 1 disorder (HCC)    ??? Bipolar 1 disorder (HCC)    ??? Depression    ??? Diabetes mellitus (HCC)    ??? Fibromyalgia    ??? Hyperlipidemia    ??? IBS (irritable bowel syndrome)    ??? PCOS (polycystic ovarian syndrome)      SURGICAL HISTORY       Past Surgical History:   Procedure Laterality Date   ??? APPENDECTOMY     ??? CHOLECYSTECTOMY     ??? FOOT SURGERY Left     plantar fascitiis release   ??? SHOULDER SURGERY Left    ??? TONSILLECTOMY       CURRENT MEDICATIONS       Previous Medications    ACETAMINOPHEN (TYLENOL) 500 MG TABLET    Take 1 tablet by mouth every 6 hours as needed for Pain    ARIPIPRAZOLE (ABILIFY) 15 MG TABLET    Take 30 mg by mouth daily     ASPIRIN 81 MG EC TABLET    Take 1 tablet by mouth daily    ATORVASTATIN (LIPITOR) 80 MG TABLET    Take 80 mg by mouth daily    AZITHROMYCIN (ZITHROMAX) 250 MG TABLET    Take 1 tablet by mouth daily    ESCITALOPRAM (LEXAPRO) 10 MG TABLET    Take 10 mg by mouth daily     FENOFIBRATE 160 MG TABLET    Take 160 mg by mouth daily     FUROSEMIDE (LASIX) 20 MG TABLET    Take 1 tablet by mouth daily    GLIMEPIRIDE (AMARYL) 1 MG TABLET    Take 1 mg by mouth every morning (before breakfast)    IBUPROFEN (ADVIL;MOTRIN) 800 MG TABLET    Take 1 tablet by mouth every 8  hours as needed for Pain    INSULIN DEGLUDEC (TRESIBA FLEXTOUCH) 200 UNIT/ML SOPN    Inject 100 Units into the skin every morning     INSULIN DETEMIR (LEVEMIR SC)    Inject 110 Units into the skin daily    LANSOPRAZOLE (PREVACID) 30 MG DELAYED RELEASE CAPSULE    Take 1 capsule by mouth daily    LIRAGLUTIDE (VICTOZA SC)    Inject 1.8 Units into the skin daily     LORAZEPAM (ATIVAN) 0.5 MG TABLET    Take 0.5 mg by mouth 2 times daily as needed for Anxiety.    SUCRALFATE (CARAFATE) 1 GM TABLET    Take 1 tablet by mouth 4 times daily    SUMATRIPTAN (IMITREX) 50 MG TABLET  Take 1 tablet by mouth daily as needed for Migraine    VALACYCLOVIR (VALTREX) 500 MG TABLET    Take 1,000 mg by mouth 2 times daily as needed      ALLERGIES     is allergic to prochlorperazine, reglan [metoclopramide], and zofran [ondansetron hcl].  FAMILY HISTORY     She indicated that the status of her mother is unknown. She indicated that the status of her father is unknown. She indicated that the status of her maternal grandmother is unknown. She indicated that the status of her maternal grandfather is unknown.     SOCIAL HISTORY       Social History     Tobacco Use   ??? Smoking status: Never Smoker   ??? Smokeless tobacco: Never Used   Vaping Use   ??? Vaping Use: Never used   Substance Use Topics   ??? 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 Drug use: No     PHYSICAL EXAM     INITIAL VITALS: BP 118/85    Pulse 89    Temp 97.5 ??F (36.4 ??C)    Resp 19    Ht 5\' 5"  (1.651 m)    Wt 205 lb 8 oz (93.2 kg)    LMP 04/30/2020    SpO2 98%    BMI 34.20 kg/m??    Physical Exam  HENT:      Head: Normocephalic.      Right Ear: External ear normal.      Left Ear: External ear normal.      Nose: Congestion and rhinorrhea present.   Eyes:      Conjunctiva/sclera: Conjunctivae normal.   Cardiovascular:      Rate and Rhythm: Normal rate.   Pulmonary:      Effort: Pulmonary effort is normal.   Abdominal:      General: Abdomen is flat.   Skin:      General: Skin is dry.   Neurological:      Mental Status: She is alert. Mental status is at baseline.   Psychiatric:         Mood and Affect: Mood normal.         Behavior: Behavior normal.         MEDICAL DECISION MAKING:   The patient is hemodynamically stable, afebrile, nontoxic-appearing.  Physical exam notable for runny nose, nasal congestion.  Based on history and exam likely viral URI.  ED plan for chest x-ray, reassess.         DIAGNOSTIC RESULTS   EKG:All EKG's are interpreted by the Emergency Department Physician who either signs or Co-signs this chart in the absence of a cardiologist.        RADIOLOGY:All plain film, CT, MRI, and formal ultrasound images (except ED bedside ultrasound) are read by the radiologist, see reports below, unless otherwisenoted in MDM or here.  XR CHEST PORTABLE   Final Result   No acute process.           LABS: All lab results were reviewed by myself, and all abnormals are listed below.  Labs Reviewed - No data to display    EMERGENCY DEPARTMENTCOURSE:   Patient did well in the ED.  Chest x-ray negative for infiltrate.  Given Rx for Robitussin with codeine.  No further work-up indicated at this time.    Nursing notes reviewed.  At this time this is what I find, the patient appears well  and does not appear sick or toxic.    I gave my usual and customary discussion of the risks and benefits of discharge versus admission.  I answered the patient's questions.  I gave the patient strict return precautions.  Patient expressed understanding of the discharge instructions.    The care is provided during an unprecedented national emergency due to the novel coronavirus, COVID-19.    Dictated but not reviewed.      Vitals:    Vitals:    05/23/20 0117   BP: 118/85   Pulse: 89   Resp: 19   Temp: 97.5 ??F (36.4 ??C)   SpO2: 98%   Weight: 205 lb 8 oz (93.2 kg)   Height: 5\' 5"  (1.651 m)       The patient was given the following medications while in the emergency department:  Orders Placed This  Encounter   Medications   ??? guaiFENesin-codeine (TUSSI-ORGANIDIN NR) 100-10 MG/5ML syrup     Sig: Take 5 mLs by mouth 3 times daily as needed for Cough for up to 3 days.     Dispense:  1 each     Refill:  0     CONSULTS:  None    FINAL IMPRESSION      1. Viral URI with cough          DISPOSITION/PLAN   DISPOSITION Decision To Discharge 05/23/2020 02:27:35 AM      PATIENT REFERRED TO:  05/25/2020, APRN - CNP  [address]  [place]  [phone]    In 2 days      DISCHARGE MEDICATIONS:  New Prescriptions    GUAIFENESIN-CODEINE (TUSSI-ORGANIDIN NR) 100-10 MG/5ML SYRUP    Take 5 mLs by mouth 3 times daily as needed for Cough for up to 3 days.     [phone], MD  Attending Emergency Physician                    Ronald Pippins, MD  05/23/20 [phone]

## 2020-05-25 ENCOUNTER — Inpatient Hospital Stay
Admit: 2020-05-25 | Discharge: 2020-05-25 | Disposition: A | Discharge status: Home / Self Care | Payer: PRIVATE HEALTH INSURANCE | Attending: Emergency Medicine

## 2020-05-25 DIAGNOSIS — Z711 Person with feared health complaint in whom no diagnosis is made: Secondary | ICD-10-CM

## 2020-05-25 LAB — URINALYSIS
Bilirubin Urine: NEGATIVE
Ketones, Urine: NEGATIVE
Nitrite, Urine: NEGATIVE
Protein, UA: NEGATIVE
Specific Gravity, UA: 1.045 — ABNORMAL HIGH (ref 1.005–1.030)
Urine Hgb: NEGATIVE
Urobilinogen, Urine: NORMAL
pH, UA: 5.5 (ref 5.0–8.0)

## 2020-05-25 LAB — MICROSCOPIC URINALYSIS
Epithelial Cells UA: 10 /HPF (ref 0–5)
RBC, UA: 2 /HPF (ref 0–2)
WBC, UA: 2 /HPF (ref 0–5)

## 2020-05-25 LAB — POCT URINE PREGNANCY: Preg Test, Ur: NEGATIVE

## 2020-05-25 MED ORDER — FLUCONAZOLE 150 MG PO TABS
150 MG | ORAL_TABLET | Freq: Once | ORAL | 0 refills | Status: AC
Start: 2020-05-25 — End: 2020-05-25

## 2020-05-25 MED ORDER — METRONIDAZOLE 500 MG PO TABS
500 MG | ORAL_TABLET | Freq: Two times a day (BID) | ORAL | 0 refills | Status: AC
Start: 2020-05-25 — End: ?

## 2020-05-25 MED ORDER — CEFTRIAXONE SODIUM 500 MG IJ SOLR
500 MG | Freq: Once | INTRAMUSCULAR | Status: AC
Start: 2020-05-25 — End: 2020-05-25
  Administered 2020-05-25: 18:00:00 via INTRAMUSCULAR

## 2020-05-25 MED ORDER — VALACYCLOVIR HCL 1 G PO TABS
1 g | ORAL_TABLET | Freq: Two times a day (BID) | ORAL | 0 refills | Status: AC
Start: 2020-05-25 — End: 2020-06-01

## 2020-05-25 MED ORDER — DOXYCYCLINE HYCLATE 100 MG PO TABS
100 MG | ORAL_TABLET | Freq: Two times a day (BID) | ORAL | 0 refills | Status: AC
Start: 2020-05-25 — End: ?

## 2020-05-25 MED FILL — CEFTRIAXONE SODIUM 500 MG IJ SOLR: 500 mg | INTRAMUSCULAR | Qty: 500

## 2020-05-25 NOTE — ED Provider Notes (Signed)
[facility] ED  EMERGENCY DEPARTMENT ENCOUNTER      Pt Name: Carolyn Crane  MRN: [id number]  Birthdate Apr 27, 1976  Date of evaluation: 05/25/2020  Provider: Cherie Ouch, MD    CHIEF COMPLAINT       Chief Complaint   Patient presents with   ??? Exposure to STD         HISTORY OF PRESENT ILLNESS  (Location/Symptom, Timing/Onset, Context/Setting, Quality, Duration, Modifying Factors, Severity.)   Carolyn Crane is a 45 y.o. female who presents to the emergency department because she is concerned about an STD.  She had a new sexual partner about a month ago and yesterday developed some swelling in her genital area.  She was also on some antibiotics recently.  She has a history of genital herpes but does not feel like she is having an outbreak but wants to be sure and wants to be prescribed the antiviral.  No vaginal discharge.      Nursing Notes were reviewed.    ALLERGIES     Prochlorperazine, Reglan [metoclopramide], and Zofran [ondansetron hcl]    CURRENT MEDICATIONS       Previous Medications    ACETAMINOPHEN (TYLENOL) 500 MG TABLET    Take 1 tablet by mouth every 6 hours as needed for Pain    ARIPIPRAZOLE (ABILIFY) 15 MG TABLET    Take 30 mg by mouth daily     ASPIRIN 81 MG EC TABLET    Take 1 tablet by mouth daily    ATORVASTATIN (LIPITOR) 80 MG TABLET    Take 80 mg by mouth daily    AZITHROMYCIN (ZITHROMAX) 250 MG TABLET    Take 1 tablet by mouth daily    ESCITALOPRAM (LEXAPRO) 10 MG TABLET    Take 10 mg by mouth daily     FENOFIBRATE 160 MG TABLET    Take 160 mg by mouth daily     FUROSEMIDE (LASIX) 20 MG TABLET    Take 1 tablet by mouth daily    GLIMEPIRIDE (AMARYL) 1 MG TABLET    Take 1 mg by mouth every morning (before breakfast)    GUAIFENESIN-CODEINE (TUSSI-ORGANIDIN NR) 100-10 MG/5ML SYRUP    Take 5 mLs by mouth 3 times daily as needed for Cough for up to 3 days.    IBUPROFEN (ADVIL;MOTRIN) 800 MG TABLET    Take 1 tablet by mouth every 8 hours as needed for Pain    INSULIN DEGLUDEC (TRESIBA  FLEXTOUCH) 200 UNIT/ML SOPN    Inject 100 Units into the skin every morning     INSULIN DETEMIR (LEVEMIR SC)    Inject 110 Units into the skin daily    LANSOPRAZOLE (PREVACID) 30 MG DELAYED RELEASE CAPSULE    Take 1 capsule by mouth daily    LIRAGLUTIDE (VICTOZA SC)    Inject 1.8 Units into the skin daily     LORAZEPAM (ATIVAN) 0.5 MG TABLET    Take 0.5 mg by mouth 2 times daily as needed for Anxiety.    SUCRALFATE (CARAFATE) 1 GM TABLET    Take 1 tablet by mouth 4 times daily    SUMATRIPTAN (IMITREX) 50 MG TABLET    Take 1 tablet by mouth daily as needed for Migraine       PAST MEDICAL HISTORY         Diagnosis Date   ??? Bipolar 1 disorder (HCC)    ??? Bipolar 1 disorder (HCC)    ??? Depression    ??? Diabetes  mellitus (HCC)    ??? Fibromyalgia    ??? Hyperlipidemia    ??? IBS (irritable bowel syndrome)    ??? PCOS (polycystic ovarian syndrome)        SURGICAL HISTORY           Procedure Laterality Date   ??? APPENDECTOMY     ??? CHOLECYSTECTOMY     ??? FOOT SURGERY Left     plantar fascitiis release   ??? SHOULDER SURGERY Left    ??? TONSILLECTOMY           FAMILY HISTORY           Problem Relation Age of Onset   ??? Cancer Mother    ??? Stroke Mother    ??? Stroke Father    ??? Cancer Maternal Grandmother    ??? Stroke Maternal Grandmother    ??? Cancer Maternal Grandfather      Family Status   Relation Name Status   ??? Mother  (Not Specified)   ??? Father  (Not Specified)   ??? MGM  (Not Specified)   ??? MGF  (Not Specified)        SOCIAL HISTORY      reports that she has never smoked. She has never used smokeless tobacco. She reports previous alcohol use. She reports that she does not use drugs.    REVIEW OF SYSTEMS    (2-9 systems for level 4, 10 or more for level 5)     Review of Systems   Constitutional: Negative for chills, fatigue and fever.   HENT: Negative for congestion, ear discharge and facial swelling.    Eyes: Negative for discharge and redness.   Respiratory: Negative for cough and shortness of breath.    Cardiovascular: Negative for chest  pain.   Gastrointestinal: Negative for abdominal pain, constipation, diarrhea and vomiting.   Genitourinary: Positive for vaginal pain. Negative for dysuria, frequency, genital sores, hematuria, vaginal bleeding and vaginal discharge.   Musculoskeletal: Negative for arthralgias.   Skin: Negative for color change and rash.   Neurological: Negative for syncope, numbness and headaches.   Hematological: Negative for adenopathy.   Psychiatric/Behavioral: Negative for confusion. The patient is not nervous/anxious.         Except as noted above the remainder of the review of systems was reviewed and negative.     PHYSICAL EXAM    (up to 7 for level 4, 8 or more for level 5)     Vitals:    05/25/20 1154   BP: 135/88   Pulse: 118   Resp: 16   Temp: 98.1 ??F (36.7 ??C)   TempSrc: Oral   SpO2: 97%   Weight: 205 lb (93 kg)   Height: 5\' 5"  (1.651 m)       Physical Exam  Vitals reviewed.   Constitutional:       General: She is not in acute distress.     Appearance: She is well-developed. She is not diaphoretic.   HENT:      Head: Normocephalic and atraumatic.   Eyes:      General: No scleral icterus.        Right eye: No discharge.         Left eye: No discharge.   Cardiovascular:      Rate and Rhythm: Normal rate and regular rhythm.   Pulmonary:      Effort: Pulmonary effort is normal. No respiratory distress.      Breath sounds: Normal breath sounds. No  stridor. No wheezing or rales.   Abdominal:      General: There is no distension.      Palpations: Abdomen is soft.      Tenderness: There is no abdominal tenderness.   Genitourinary:     Comments: Genital area shows no skin lesions.  No blisters or fissures.  No bleeding.  No abscess present  Musculoskeletal:         General: Normal range of motion.      Cervical back: Neck supple.   Lymphadenopathy:      Cervical: No cervical adenopathy.   Skin:     General: Skin is warm and dry.      Findings: No erythema or rash.   Neurological:      Mental Status: She is alert and oriented to  person, place, and time.   Psychiatric:         Behavior: Behavior normal.             DIAGNOSTIC RESULTS     EKG: All EKG's are interpreted by the Emergency Department Physician who either signs or Co-signs this chart in the absence of a cardiologist.    Not indicated    RADIOLOGY:   Non-plain film images such as CT, Ultrasound and MRI are read by the radiologist. Plain radiographic images are visualized and preliminarily interpreted by the emergency physician with the below findings:    Not indicated    Interpretation per the Radiologist below, if available at the time of this note:        LABS:  Labs Reviewed   C.TRACHOMATIS N.GONORRHOEAE DNA, URINE   URINALYSIS   POCT URINE PREGNANCY       All other labs were within normal range or not returned as of this dictation.    EMERGENCY DEPARTMENT COURSE and DIFFERENTIAL DIAGNOSIS/MDM:   Vitals:    Vitals:    05/25/20 1154   BP: 135/88   Pulse: 118   Resp: 16   Temp: 98.1 ??F (36.7 ??C)   TempSrc: Oral   SpO2: 97%   Weight: 205 lb (93 kg)   Height: 5\' 5"  (1.651 m)       Orders Placed This Encounter   Medications   ??? cefTRIAXone (ROCEPHIN) injection 500 mg     Order Specific Question:   Antimicrobial Indications     Answer:   STD infection   ??? fluconazole (DIFLUCAN) 150 MG tablet     Sig: Take 1 tablet by mouth once for 1 dose     Dispense:  1 tablet     Refill:  0   ??? doxycycline hyclate (VIBRA-TABS) 100 MG tablet     Sig: Take 1 tablet by mouth 2 times daily     Dispense:  20 tablet     Refill:  0   ??? valACYclovir (VALTREX) 1 g tablet     Sig: Take 1 tablet by mouth 2 times daily for 7 days     Dispense:  14 tablet     Refill:  0   ??? metroNIDAZOLE (FLAGYL) 500 MG tablet     Sig: Take 1 tablet by mouth 2 times daily     Dispense:  20 tablet     Refill:  0       Medical Decision Making: URiprobe's are ordered and she was treated with Rocephin here and prescribed doxycycline, Diflucan, Valtrex, and Flagyl.  Treatment diagnosis and follow-up were discussed with the  patient  CONSULTS:  None    PROCEDURES:  None    FINAL IMPRESSION      1. Concern about STD in female without diagnosis          DISPOSITION/PLAN   DISPOSITION Decision To Discharge 05/25/2020 12:25:14 PM      PATIENT REFERRED TO:   Stoney Bang, APRN - CNP  [address]  [place] [address]  [phone]      As needed    [facility] ED  [address] [place] [address]  [phone]    If symptoms worsen      DISCHARGE MEDICATIONS:     New Prescriptions    DOXYCYCLINE HYCLATE (VIBRA-TABS) 100 MG TABLET    Take 1 tablet by mouth 2 times daily    FLUCONAZOLE (DIFLUCAN) 150 MG TABLET    Take 1 tablet by mouth once for 1 dose    METRONIDAZOLE (FLAGYL) 500 MG TABLET    Take 1 tablet by mouth 2 times daily    VALACYCLOVIR (VALTREX) 1 G TABLET    Take 1 tablet by mouth 2 times daily for 7 days       The care is provided during an unprecedented national emergency due to the novel coronavirus, COVID-19.    (Please note that portions of this note were completed with a voice recognition program.  Efforts were made to edit the dictations but occasionally words are mis-transcribed.)    Cherie Ouch, MD  Attending Emergency Physician           Cherie Ouch, MD  05/25/20 1233

## 2020-05-25 NOTE — ED Triage Notes (Signed)
Arrives ambulatory with c/o 1 day swelling, itching and vaginal burning. 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

## 2020-09-12 ENCOUNTER — Other Ambulatory Visit: Payer: Self-pay

## 2020-09-12 ENCOUNTER — Emergency Department (HOSPITAL_COMMUNITY): Payer: Self-pay

## 2020-09-12 ENCOUNTER — Emergency Department (HOSPITAL_COMMUNITY)
Admission: EM | Admit: 2020-09-12 | Discharge: 2020-09-12 | Disposition: A | Discharge status: Home / Self Care | Payer: Self-pay | Attending: Emergency Medicine | Admitting: Emergency Medicine

## 2020-09-12 ENCOUNTER — Encounter (HOSPITAL_COMMUNITY): Payer: Self-pay

## 2020-09-12 DIAGNOSIS — Z794 Long term (current) use of insulin: Secondary | ICD-10-CM | POA: Insufficient documentation

## 2020-09-12 DIAGNOSIS — E1165 Type 2 diabetes mellitus with hyperglycemia: Secondary | ICD-10-CM | POA: Insufficient documentation

## 2020-09-12 DIAGNOSIS — R519 Headache, unspecified: Secondary | ICD-10-CM | POA: Insufficient documentation

## 2020-09-12 DIAGNOSIS — R6884 Jaw pain: Secondary | ICD-10-CM | POA: Insufficient documentation

## 2020-09-12 DIAGNOSIS — R110 Nausea: Secondary | ICD-10-CM | POA: Insufficient documentation

## 2020-09-12 DIAGNOSIS — R079 Chest pain, unspecified: Secondary | ICD-10-CM | POA: Insufficient documentation

## 2020-09-12 DIAGNOSIS — Z7984 Long term (current) use of oral hypoglycemic drugs: Secondary | ICD-10-CM | POA: Insufficient documentation

## 2020-09-12 LAB — CBC
HCT: 37.6 % (ref 36.0–46.0)
Hemoglobin: 12.6 g/dL (ref 12.0–15.0)
MCH: 30.6 pg (ref 26.0–34.0)
MCHC: 33.5 g/dL (ref 30.0–36.0)
MCV: 91.3 fL (ref 80.0–100.0)
Platelets: 367 10*3/uL (ref 150–400)
RBC: 4.12 MIL/uL (ref 3.87–5.11)
RDW: 12.4 % (ref 11.5–15.5)
WBC: 7.9 10*3/uL (ref 4.0–10.5)
nRBC: 0 % (ref 0.0–0.2)

## 2020-09-12 LAB — CBG MONITORING, ED: Glucose-Capillary: 207 mg/dL — ABNORMAL HIGH (ref 70–99)

## 2020-09-12 LAB — TROPONIN I (HIGH SENSITIVITY)
Troponin I (High Sensitivity): 3 ng/L (ref <18)
Troponin I (High Sensitivity): 2 ng/L (ref <18)

## 2020-09-12 LAB — I-STAT BETA HCG BLOOD, ED (MC, WL, AP ONLY): I-stat hCG, quantitative: <5 m[IU]/mL (ref <5)

## 2020-09-12 LAB — BASIC METABOLIC PANEL
Anion gap: 7 (ref 5–15)
BUN: 16 mg/dL (ref 6–20)
CO2: 25 mmol/L (ref 22–32)
Calcium: 9.2 mg/dL (ref 8.9–10.3)
Chloride: 102 mmol/L (ref 98–111)
Creatinine, Ser: 0.78 mg/dL (ref 0.44–1.00)
GFR, Estimated: >60 mL/min (ref >60)
Glucose, Bld: 222 mg/dL — ABNORMAL HIGH (ref 70–99)
Potassium: 4.4 mmol/L (ref 3.5–5.1)
Sodium: 134 mmol/L — ABNORMAL LOW (ref 135–145)

## 2020-09-12 LAB — LIPASE, BLOOD: Lipase: 38 U/L (ref 11–51)

## 2020-09-12 MED ORDER — NITROGLYCERIN 0.4 MG SL SUBL: 0.4000 mg | SUBLINGUAL_TABLET | SUBLINGUAL | 0 refills | Status: DC | PRN

## 2020-09-12 MED ORDER — LORAZEPAM 1 MG PO TABS: 1.0000 mg | ORAL_TABLET | Freq: Three times a day (TID) | ORAL | 0 refills | Status: DC | PRN

## 2020-09-12 MED ORDER — ROSUVASTATIN CALCIUM 20 MG PO TABS: 20.0000 mg | ORAL_TABLET | Freq: Every day | ORAL | 2 refills | Status: DC

## 2020-09-12 MED ORDER — NITROGLYCERIN 0.4 MG SL SUBL
0.4000 mg | SUBLINGUAL_TABLET | SUBLINGUAL | Status: DC | PRN
  Administered 2020-09-12: 0.4 mg via SUBLINGUAL
  Filled 2020-09-12: qty 1

## 2020-09-12 MED ORDER — GLIMEPIRIDE 2 MG PO TABS: 2.0000 mg | ORAL_TABLET | Freq: Every day | ORAL | 2 refills | Status: DC

## 2020-09-12 MED ORDER — FENOFIBRATE 48 MG PO TABS: 48.0000 mg | ORAL_TABLET | Freq: Every day | ORAL | 2 refills | Status: DC

## 2020-09-12 MED ORDER — LORAZEPAM 2 MG/ML IJ SOLN
1.0000 mg | Freq: Once | INTRAMUSCULAR | Status: AC
  Administered 2020-09-12: 1 mg via INTRAVENOUS
  Filled 2020-09-12: qty 1

## 2020-09-12 NOTE — Discharge Instructions (Signed)
You were seen in the emergency department for an episode of chest pain last night.  Your work-up today did not show signs that you are having an active heart attack.  It is not clear whether your chest pain is in fact coming from your heart or another condition like reflux.  However, we talked about your significant risk factors for heart disease.  It is extremely important that you follow-up with a cardiologist in the office for additional testing.  You may need a stress test, echocardiogram, or further work-up for your heart health.  I explained to you that a single work-up in the emergency department does not exclude significant heart disease, and that if you have return of your chest pain or pressure, you should call 911 or return to the ER.  I prescribed some nitroglycerin tablets.  If you have return of your symptoms, you can take the nitroglycerin tablet as prescribed.  You can take up to 2 doses 5 minutes apart, and see if this improves your chest pain.  If your chest pain resolves completely, you can follow-up with a cardiologist in the office.  However if your symptoms persist, you should call 911 and come back to the ER.

## 2020-09-12 NOTE — ED Notes (Signed)
Patient back from radiology at this time 

## 2020-09-12 NOTE — ED Notes (Signed)
Trifan MD to bedside at this time

## 2020-09-12 NOTE — ED Provider Notes (Signed)
[facility]-EMERGENCY DEPT Provider Note   CSN: [id number] Arrival date & time: 09/12/20  0805     History Chief Complaint  Patient presents with  . Chest Pain  . Hyperglycemia  . Jaw Pain  . Nausea    Danielle Barker is a 45 y.o. female with a history of type 2 diabetes on insulin, high cholesterol, high triglycerides, bipolar type II, cholecystectomy, appendectomy, presenting to emergency department with chest pain and jaw pain.  Patient reports she was in her normal state of health yesterday.  She woke up around midnight from sleep with a pressure sensation in the left side of her chest and also left-sided jaw pain.  She has never had this feeling before.  The chest pressure has improved over time, but she continues to have jaw pain.  She also reports feeling bloated, nauseous.  She said her blood sugars were high last night in the 400's range; normally she is tightly controlled with blood sugars running in the 100s range.  She takes 120 units long-acting insulin and 10 units TID with meals on SS.  Last night she took an additional 30 units of her short acting insulin after noting her sugars were high.  She does report a mild headache now.  She denies fevers or chills.  She denies sore throat.  She denies cough, congestion, diarrhea, constipation.  She denies any dysuria, reports only one episode of UTI many years ago.  She denies history of smoking or any recreational drug use.  She does report a significant family history of MI, and her grandfather at age 85, her aunt at age 85.  She reports her mother also has significant coronary disease.  She reports that she herself has never had a cardiac work-up or told she has cardiac disease.  She does report that she has been off of her triglyceride medicine and statin for several months because her insurance ran out.  She recently moved here from [place] to be closer to family, and has not been able to establish care with a new  PCP.  She is working on Advice worker currently.  Denies hx of HTN. Denies hx of exertional chest pain or pressure.  She reports that she was symptomatic with COVID in January, had a "really bad illness", and was initially unvaccinated at that time, but since then has had 2 doses of the COVID-vaccine.  She reports drug allergies to all antinausea medicines, including Zofran, Reglan, Compazine.  She says that Ativan is the only medication that helps her for nausea.  She does feel nauseous and is asking for Ativan.  HPI     Past Medical History:  Diagnosis Date  . Diabetes mellitus without complication (HCC)   . High cholesterol   . High triglycerides   . Mood disorder (HCC)     There are no problems to display for this patient.   Past Surgical History:  Procedure Laterality Date  . APPENDECTOMY    . CHOLECYSTECTOMY    . TONSILLECTOMY       OB History   No obstetric history on file.     No family history on file.  Social History   Tobacco Use  . Smoking status: Never Smoker  Substance Use Topics  . Alcohol use: Yes  . Drug use: No    Home Medications Prior to Admission medications   Medication Sig Start Date End Date Taking? Authorizing Provider  fenofibrate (TRICOR) 48 MG tablet Take 1 tablet (  48 mg total) by mouth daily. 09/12/20 10/12/20 Yes Amaani Guilbault, Kermit Balo, MD  glimepiride (AMARYL) 2 MG tablet Take 1 tablet (2 mg total) by mouth daily with breakfast. 09/12/20 10/12/20 Yes Recardo Linn, Kermit Balo, MD  LORazepam (ATIVAN) 1 MG tablet Take 1 tablet (1 mg total) by mouth every 8 (eight) hours as needed for up to 10 doses (Nausea). 09/12/20  Yes Masaichi Kracht, Kermit Balo, MD  nitroGLYCERIN (NITROSTAT) 0.4 MG SL tablet Place 1 tablet (0.4 mg total) under the tongue every 5 (five) minutes as needed for chest pain. Up to 2 doses 09/12/20  Yes Tymel Conely, Kermit Balo, MD  rosuvastatin (CRESTOR) 20 MG tablet Take 1 tablet (20 mg total) by mouth daily for 30 doses. 09/12/20 10/12/20 Yes  Kiondra Caicedo, Kermit Balo, MD  ARIPiprazole (ABILIFY) 5 MG tablet Take 5 mg by mouth daily.    [provider]  citalopram (CELEXA) 40 MG tablet Take 40 mg by mouth daily.    [provider]  insulin aspart (NOVOLOG) 100 UNIT/ML injection Inject into the skin 3 (three) times daily with meals.    [provider]  insulin detemir (LEVEMIR) 100 UNIT/ML injection Inject 100 Units into the skin daily.    [provider]  lisinopril (PRINIVIL,ZESTRIL) 5 MG tablet Take 5 mg by mouth daily.    [provider]  metFORMIN (GLUCOPHAGE) 1000 MG tablet Take 1,000 mg by mouth 2 (two) times daily with a meal.    [provider]    Allergies    Compazine [prochlorperazine], Reglan [metoclopramide], and Zofran [ondansetron]  Review of Systems   Review of Systems  Constitutional: Positive for fatigue. Negative for chills and fever.  HENT: Negative for ear pain and sore throat.   Eyes: Negative for pain and visual disturbance.  Respiratory: Positive for shortness of breath. Negative for cough.   Cardiovascular: Positive for chest pain. Negative for palpitations.  Gastrointestinal: Positive for nausea. Negative for abdominal pain, constipation, diarrhea and vomiting.  Genitourinary: Negative for dysuria and hematuria.  Musculoskeletal: Negative for arthralgias and back pain.  Skin: Negative for color change and rash.  Neurological: Positive for headaches. Negative for syncope.  All other systems reviewed and are negative.   Physical Exam Updated Vital Signs BP 139/86   Pulse 85   Temp 98.4 F (36.9 C) (Oral)   Resp 20   Wt 96.6 kg   LMP 09/05/2020 (Approximate)   SpO2 100%   BMI 34.38 kg/m   Physical Exam Constitutional:      General: She is not in acute distress. HENT:     Head: Normocephalic and atraumatic.  Eyes:     Conjunctiva/sclera: Conjunctivae normal.     Pupils: Pupils are equal, round, and reactive to light.  Cardiovascular:      Rate and Rhythm: Normal rate and regular rhythm.  Pulmonary:     Effort: Pulmonary effort is normal. No respiratory distress.  Abdominal:     General: There is no distension.     Tenderness: There is no abdominal tenderness.  Skin:    General: Skin is warm and dry.  Neurological:     General: No focal deficit present.     Mental Status: She is alert. Mental status is at baseline.  Psychiatric:        Mood and Affect: Mood normal.        Behavior: Behavior normal.     ED Results / Procedures / Treatments   Labs (all labs ordered are listed, but only abnormal results  are displayed) Labs Reviewed  BASIC METABOLIC PANEL - Abnormal; Notable for the following components:      Result Value   Sodium 134 (*)    Glucose, Bld 222 (*)    All other components within normal limits  CBG MONITORING, ED - Abnormal; Notable for the following components:   Glucose-Capillary 207 (*)    All other components within normal limits  CBC  LIPASE, BLOOD  I-STAT BETA HCG BLOOD, ED ([facility], [facility], [facility] ONLY)  TROPONIN I (HIGH SENSITIVITY)  TROPONIN I (HIGH SENSITIVITY)    EKG EKG Interpretation  Date/Time:  Thursday Sep 12 2020 08:24:14 EDT Ventricular Rate:  69 PR Interval:  162 QRS Duration: 87 QT Interval:  388 QTC Calculation: 416 R Axis:   79 Text Interpretation: Sinus rhythm Confirmed by Alvester Chou [phone]) on 09/12/2020 8:28:01 AM   Radiology DG Chest 2 View  Result Date: 09/12/2020 CLINICAL DATA:  45 year old female with new onset chest pain, left jaw pain since midnight. EXAM: CHEST - 2 VIEW COMPARISON:  Portable chest 10/09/2012. FINDINGS: Lung volumes and mediastinal contours remain normal. Visualized tracheal air column is within normal limits. Both lungs appear clear. No pneumothorax or pleural effusion. Cholecystectomy clips in the right upper quadrant. Negative visible bowel gas pattern. No acute osseous abnormality identified. Mild dystrophic calcifications adjacent to the right  humeral head. IMPRESSION: Negative.  No cardiopulmonary abnormality. Electronically Signed   By: Odessa Fleming M.D.   On: 09/12/2020 09:01    Procedures Procedures   Medications Ordered in ED Medications  LORazepam (ATIVAN) injection 1 mg (1 mg Intravenous Given 09/12/20 1751)    ED Course  I have reviewed the triage vital signs and the nursing notes.  Pertinent labs & imaging results that were available during my care of the patient were reviewed by me and considered in my medical decision making (see chart for details).  This patient presents to the Emergency Department with complaint of chest pain. This involves an extensive number of treatment options, and is a complaint that carries with it a high risk of complications and morbidity.  The differential diagnosis includes ACS vs Pneumothorax vs Reflux/Gastritis vs MSK pain vs Pneumonia vs other.  I felt PE was less likely given that her lack of respiratory complaint, no hypoxia, no tachycardia.  She is PERC negative.  I ordered, reviewed, and interpreted labs, including BMP and CBC.  There were no immediate, life-threatening emergencies found in this labwork.  The patient's troponin level was 3 ->2. I ordered medication SL nitro for chest pain, Ativan for nausea. I ordered imaging studies which included dg chest I independently visualized and interpreted imaging which showed no focal disease process and the monitor tracing which showed NSR, occasional PVC I personally reviewed the patients ECG which showed sinus rhythm with no acute ischemic findings    Clinical Course as of 09/12/20 1745  Thu Sep 12, 2020  1047 2nd trop 2.  Paged cardiology [MT]  1140 Correction, it appears the patient did in fact use the nitro.  She felt like she had complete resolution of her chest pain with nitroglycerin.  Her mother is now present at the bedside.  I did discuss the case with Dr Scharlene Gloss from cardiology by phone, who felt that with 2 very low and  undetectable troponins, and his normal EKG, this is unlikely to be ACS, but agreed that the patient can follow-up in the office for outpatient stress.  Patient has HEART score of 4-5 and is therefore  moderate risk.  I discussed carefully with the patient and her mother my concerns for her medical history, risk factors for heart disease, need for very close cardiology follow-up.  She verbalizes understanding.  She says she will prefer not to stay in the hospital at this time because she does not have active insurance, cannot afford the hospital stay, and she is pain-free at this time.  She understands that her work-up in the ER does not exclude significant coronary disease, and that she needs to return to the ER if she has resumption of her chest pain or pressure.  I also discussed with them filling some of the prescriptions that she has missing for her chronic medications told she can establish care with a PCP.  I was able to answer all of their questions the best my ability. [MT]    Clinical Course User Index [MT] Terald Sleeperrifan, Tavaris Eudy J, MD    Final Clinical Impression(s) / ED Diagnoses Final diagnoses:  Chest pain, unspecified type    Rx / DC Orders ED Discharge Orders         Ordered    Ambulatory referral to Cardiology       Comments: Chest pain visit to ED, significant cardiac risk factors, need for close follow up   09/12/20 1142    LORazepam (ATIVAN) 1 MG tablet  Every 8 hours PRN        09/12/20 1150    fenofibrate (TRICOR) 48 MG tablet  Daily        09/12/20 1150    rosuvastatin (CRESTOR) 20 MG tablet  Daily        09/12/20 1150    glimepiride (AMARYL) 2 MG tablet  Daily with breakfast        09/12/20 1150    nitroGLYCERIN (NITROSTAT) 0.4 MG SL tablet  Every 5 min PRN        09/12/20 1150           Terald Sleeperrifan, Emunah Texidor J, MD 09/12/20 1745

## 2020-09-12 NOTE — ED Notes (Signed)
Patient transported to X-ray 

## 2020-09-12 NOTE — ED Notes (Signed)
Mother to bedside at this time 

## 2020-09-12 NOTE — ED Triage Notes (Signed)
Coming from home, jaw pain and chest pressure since midnight, BGL over 457 at home, took insulin, now 269

## 2020-09-18 ENCOUNTER — Encounter (HOSPITAL_COMMUNITY): Payer: Self-pay

## 2020-09-18 ENCOUNTER — Emergency Department (HOSPITAL_COMMUNITY)
Admission: EM | Admit: 2020-09-18 | Discharge: 2020-09-19 | Disposition: A | Discharge status: Home / Self Care | Payer: Medicaid - Out of State | Attending: Emergency Medicine | Admitting: Emergency Medicine

## 2020-09-18 ENCOUNTER — Other Ambulatory Visit: Payer: Self-pay

## 2020-09-18 DIAGNOSIS — E119 Type 2 diabetes mellitus without complications: Secondary | ICD-10-CM | POA: Diagnosis not present

## 2020-09-18 DIAGNOSIS — R519 Headache, unspecified: Secondary | ICD-10-CM | POA: Insufficient documentation

## 2020-09-18 MED ORDER — NAPROXEN 500 MG PO TABS: 500.0000 mg | ORAL_TABLET | Freq: Two times a day (BID) | ORAL | 0 refills | Status: DC

## 2020-09-18 MED ORDER — PENICILLIN V POTASSIUM 500 MG PO TABS: 500.0000 mg | ORAL_TABLET | Freq: Four times a day (QID) | ORAL | 0 refills | Status: AC

## 2020-09-18 NOTE — ED Notes (Signed)
Pt unable to sign discharge paperwork due to placement in hall bed, pt expressed understanding of d/c instructions and all questions were answered.

## 2020-09-18 NOTE — ED Triage Notes (Signed)
Pt reports left sided facial pain x2-3 days. Pt states she also has some cracked teeth on the left side. Denies any numbness or weakness. No facial droop present.

## 2020-09-18 NOTE — ED Provider Notes (Signed)
[facility]-EMERGENCY DEPT [facility] Emergency Department Provider Note MRN:  [id number]  Arrival date & time: 09/18/20     Chief Complaint   Facial Pain   History of Present Illness   Danielle Barker is a 45 y.o. year-old female with a history of diabetes presenting to the ED with chief complaint of facial pain.  Pain is located in the left side of the face mostly in the left jaw.  Denies any trauma.  Pain initially mild for the past 2 to 3 days but much worse today.  Denies fever.  No hearing loss or discharge from the ear.  Has a chipped tooth on the side.  No neck pain, no chest pain, no other complaints.  Pain is moderate to severe, constant, worse with motion of the jaw.  Review of Systems  A complete 10 system review of systems was obtained and all systems are negative except as noted in the HPI and PMH.   Patient's Health History    Past Medical History:  Diagnosis Date  . Diabetes mellitus without complication (HCC)   . High cholesterol   . High triglycerides   . Mood disorder [facility])     Past Surgical History:  Procedure Laterality Date  . APPENDECTOMY    . CHOLECYSTECTOMY    . TONSILLECTOMY      History reviewed. No pertinent family history.  Social History   Socioeconomic History  . Marital status: Divorced    Spouse name: Not on file  . Number of children: Not on file  . Years of education: Not on file  . Highest education level: Not on file  Occupational History  . Not on file  Tobacco Use  . Smoking status: Never Smoker  . Smokeless tobacco: Not on file  Substance and Sexual Activity  . Alcohol use: Yes  . Drug use: No  . Sexual activity: Not on file  Other Topics Concern  . Not on file  Social History Narrative  . Not on file   Social Determinants of Health   Financial Resource Strain: Not on file  Food Insecurity: Not on file  Transportation Needs: Not on file  Physical Activity: Not on file  Stress: Not on file  Social Connections: Not  on file  Intimate Partner Violence: Not on file     Physical Exam   Vitals:   09/18/20 2238  BP: 132/79  Pulse: 81  Resp: 17  Temp: 98.5 F (36.9 C)  SpO2: 99%    CONSTITUTIONAL: Well-appearing, NAD NEURO:  Alert and oriented x 3, no focal deficits EYES:  eyes equal and reactive ENT/NECK:  no LAD, no JVD, left mandibular molar is with significant decay, some tenderness; tenderness to the left TMJ CARDIO: Regular rate, well-perfused, normal S1 and S2 PULM:  CTAB no wheezing or rhonchi GI/GU:  normal bowel sounds, non-distended, non-tender MSK/SPINE:  No gross deformities, no edema SKIN:  no rash, atraumatic PSYCH:  Appropriate speech and behavior  *Additional and/or pertinent findings included in MDM below  Diagnostic and Interventional Summary    EKG Interpretation  Date/Time:    Ventricular Rate:    PR Interval:    QRS Duration:   QT Interval:    QTC Calculation:   R Axis:     Text Interpretation:        Labs Reviewed - No data to display  No orders to display    Medications - No data to display   Procedures  /  Critical Care Procedures  ED Course and Medical Decision Making  I have reviewed the triage vital signs, the nursing notes, and pertinent available records from the EMR.  Listed above are laboratory and imaging tests that I personally ordered, reviewed, and interpreted and then considered in my medical decision making (see below for details).  Normal vital signs, no evidence of significant oral infection, no abscess, submandibular area is soft, nontender.  Pain seems most localized to the TMJ.  Appropriate for discharge with anti-inflammatories, dentistry follow-up.       Elmer Sow. Pilar Plate, MD [facility] Emergency Medicine [facility] [email or web address]  Final Clinical Impressions(s) / ED Diagnoses     ICD-10-CM   1. Facial pain  R51.9     ED Discharge Orders         Ordered    naproxen (NAPROSYN) 500 MG tablet  2 times  daily        09/18/20 2342    penicillin v potassium (VEETID) 500 MG tablet  4 times daily        09/18/20 2342           Discharge Instructions Discussed with and Provided to Patient:     Discharge Instructions     You were evaluated in the Emergency Department and after careful evaluation, we did not find any emergent condition requiring admission or further testing in the hospital.  Your exam/testing today is overall reassuring.  Symptoms may be due to inflammation of the temporomandibular joint or an early tooth infection.  Please take the penicillin as directed.  Use the Naprosyn twice daily for the pain.  Recommend follow-up with a dentist.  Please return to the Emergency Department if you experience any worsening of your condition.   Thank you for allowing [place] to be a part of your care.      Sabas Sous, MD 09/18/20 [phone]

## 2020-09-18 NOTE — Discharge Instructions (Addendum)
You were evaluated in the Emergency Department and after careful evaluation, we did not find any emergent condition requiring admission or further testing in the hospital.  Your exam/testing today is overall reassuring.  Symptoms may be due to inflammation of the temporomandibular joint or an early tooth infection.  Please take the penicillin as directed.  Use the Naprosyn twice daily for the pain.  Recommend follow-up with a dentist.  Please return to the Emergency Department if you experience any worsening of your condition.   Thank you for allowing [place] to be a part of your care.

## 2020-11-25 ENCOUNTER — Emergency Department (HOSPITAL_COMMUNITY)
Admission: EM | Admit: 2020-11-25 | Discharge: 2020-11-25 | Discharge status: Against Medical Advice | Payer: Medicaid - Out of State

## 2020-11-25 ENCOUNTER — Ambulatory Visit (HOSPITAL_COMMUNITY)
Admission: EM | Admit: 2020-11-25 | Discharge: 2020-11-25 | Disposition: A | Discharge status: Home / Self Care | Payer: No Payment, Other | Attending: Urology | Admitting: Urology

## 2020-11-25 ENCOUNTER — Other Ambulatory Visit: Payer: Self-pay

## 2020-11-25 DIAGNOSIS — F411 Generalized anxiety disorder: Secondary | ICD-10-CM | POA: Insufficient documentation

## 2020-11-25 DIAGNOSIS — Z760 Encounter for issue of repeat prescription: Secondary | ICD-10-CM | POA: Diagnosis not present

## 2020-11-25 DIAGNOSIS — Z79899 Other long term (current) drug therapy: Secondary | ICD-10-CM | POA: Insufficient documentation

## 2020-11-25 DIAGNOSIS — F3181 Bipolar II disorder: Secondary | ICD-10-CM | POA: Insufficient documentation

## 2020-11-25 MED ORDER — ARIPIPRAZOLE 30 MG PO TABS: 30.0000 mg | ORAL_TABLET | Freq: Every day | ORAL | 1 refills | Status: DC

## 2020-11-25 MED ORDER — ESCITALOPRAM OXALATE 20 MG PO TABS: 20.0000 mg | ORAL_TABLET | Freq: Every day | ORAL | 1 refills | Status: DC

## 2020-11-25 MED ORDER — GABAPENTIN 100 MG PO CAPS: 100.0000 mg | ORAL_CAPSULE | Freq: Three times a day (TID) | ORAL | 0 refills | Status: DC

## 2020-11-25 MED ORDER — HYDROXYZINE HCL 25 MG PO TABS: 25.0000 mg | ORAL_TABLET | Freq: Three times a day (TID) | ORAL | 0 refills | Status: DC | PRN

## 2020-11-25 NOTE — Discharge Instructions (Addendum)
  Discharge recommendations:  Patient is to take medications as prescribed. Please see information for follow-up appointment with psychiatry and therapy. Please follow up with your primary care provider for all medical related needs.   Please handout for Open Access hours for therapy and medication management.   Therapy: We recommend that patient participate in individual therapy to address mental health concerns.  Medications: The parent/guardian is to contact a medical professional and/or outpatient provider to address any new side effects that develop. Parent/guardian should update outpatient providers of any new medications and/or medication changes.   Safety:  The patient should abstain from use of illicit substances/drugs and abuse of any medications. If symptoms worsen or do not continue to improve or if the patient becomes actively suicidal or homicidal then it is recommended that the patient return to the closest hospital emergency department, the [facility], or call 911 for further evaluation and treatment. National Suicide Prevention Lifeline 1-800-SUICIDE or 636-510-6262.

## 2020-11-25 NOTE — Progress Notes (Signed)
TRIAGE: ROUTINE    11/25/20 1950  [facility] Triage Screening (Walk-ins at [facility] only)  How Did You Hear About [place]? Self  What Is the Reason for Your Visit/Call Today? Pt states, "I just moved from [place] in April. I've been treated with medication for Bipolar II since I was 21. I've done meds, therapy, and groups." Pt shares she currently has no insurance (until October) and took the last of 2 of her medications today; the other two medications she has been without for 4 months.  How Long Has This Been Causing You Problems? 1 wk - 1 month  Have You Recently Had Any Thoughts About Hurting Yourself? No  Are You Planning to Commit Suicide/Harm Yourself At This time? No  Have you Recently Had Thoughts About Hurting Someone Karolee Ohs? No  Are You Planning To Harm Someone At This Time? No  Are you currently experiencing any auditory, visual or other hallucinations? No  Have You Used Any Alcohol or Drugs in the Past 24 Hours? 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 No  Clinician description of patient physical appearance/behavior: Pt is dressed in her work clothes (scrubs). She is able to express her thoughts and feelings and is able to answer questions openly and effectively.  What Do You Feel Would Help You the Most Today? Medication(s);Treatment for Depression or other mood problem  If access to [facility] was not available, would you have sought care in the Emergency Department? No  Determination of Need Routine (7 days)  Options For Referral Medication Management;Outpatient Therapy

## 2020-11-25 NOTE — ED Provider Notes (Signed)
Behavioral Health Urgent Care Medical Screening Exam  Patient Name: Danielle Barker MRN: [id number] Date of Evaluation: 11/25/20 Chief Complaint:   Diagnosis:  Final diagnoses:  Bipolar 2 disorder (HCC)  GAD (generalized anxiety disorder)    History of Present illness: Danielle Barker is a 45 y.o. female with self reported psychiatric history of Bipolar II disorder and GAD. Patient presented voluntarily to [facility] request medication refills. Patient reports that she takes Abilify 30mg /day, lexapro 20mg /day, Gabapentin 100mg  TID, Ativan 1mg  TID PRN. Patient reports that she relocated to [place], from in April, 2022. She reports that she has not established care with a PCP or psychiatric provider due to financial restraint and insurance. She says her insurance will become active in October, 2022.   Patient seen face to face and chart reviewed by this NP. Patient is alert and oriented x4, calm and cooperative. She is speaking in a clear voice at normal rate and volume with good eye contact. Her thought process is coherent. There is no indication that she is responding to internal/external stimuli or experiencing delusional thought content.   Patient reports that she took her last remaining gabapentin 100mg  pill today and that she ran out of her order medications "a while ago." She says she is having difficulties with concentration, poor sleep, and "feels a manic episode coming on." She denies SI, HI, AVH, paranoia, and substance abuse. She report past history of alcohol abuse, she says she has been sober for approximately 1 year. She lives at home with her mother.    Psychiatric Specialty Exam  Presentation  General Appearance:Appropriate for Environment  Eye Contact:Good  Speech:Clear and Coherent  Speech Volume:Normal  Handedness:Right   Mood and Affect  Mood:Euthymic  Affect:Congruent   Thought Process  Thought Processes:Coherent  Descriptions of  Associations:Intact  Orientation:Full (Time, Place and Person)  Thought Content:Logical; WDL    Hallucinations:None  Ideas of Reference:None  Suicidal Thoughts:No  Homicidal Thoughts:No   Sensorium  Memory:Immediate Good; Recent Good; Remote Good  Judgment:Good  Insight:Good   Executive Functions  Concentration:Good  Attention Span:Good  Recall:Good  Fund of Knowledge:Good  Language:Good   Psychomotor Activity  Psychomotor Activity:Normal   Assets  Assets:Housing; Desire for Improvement; Communication Skills; Physical Health; Social Support; Transportation; Vocational/Educational   Sleep  Sleep:Fair  Number of hours: 5   No data recorded  Physical Exam: Physical Exam Vitals and nursing note reviewed.  Constitutional:      General: She is not in acute distress.    Appearance: She is well-developed.  HENT:     Head: Normocephalic and atraumatic.  Eyes:     Conjunctiva/sclera: Conjunctivae normal.  Cardiovascular:     Rate and Rhythm: Normal rate.     Heart sounds: No murmur heard. Pulmonary:     Effort: Pulmonary effort is normal. No respiratory distress.     Breath sounds: Normal breath sounds.  Abdominal:     Palpations: Abdomen is soft.     Tenderness: There is no abdominal tenderness.  Musculoskeletal:        General: Normal range of motion.     Cervical back: Neck supple.  Skin:    General: Skin is warm and dry.  Neurological:     Mental Status: She is alert and oriented to person, place, and time.  Psychiatric:        Attention and Perception: Attention and perception normal. She is attentive. She does not perceive auditory or visual hallucinations.  Mood and Affect: Mood is anxious and depressed. Affect is not blunt, flat, angry, tearful or inappropriate.        Speech: Speech normal.        Behavior: Behavior normal. Behavior is cooperative.        Thought Content: Thought content normal. Thought content is not paranoid or  delusional. Thought content does not include homicidal or suicidal ideation. Thought content does not include homicidal or suicidal plan.        Cognition and Memory: Cognition normal.        Judgment: Judgment normal.   Review of Systems  Constitutional: Negative.   HENT: Negative.    Respiratory: Negative.    Cardiovascular: Negative.   Musculoskeletal: Negative.   Skin: Negative.   Neurological: Negative.   Endo/Heme/Allergies: Negative.   Psychiatric/Behavioral:  Positive for depression. Negative for hallucinations, memory loss, substance abuse and suicidal ideas. The patient is nervous/anxious. The patient does not have insomnia.   Blood pressure 117/74, pulse 89, temperature 97.7 F (36.5 C), temperature source Oral, resp. rate 18, SpO2 97 %. There is no height or weight on file to calculate BMI.  Musculoskeletal: Strength & Muscle Tone: within normal limits Gait & Station: normal Patient leans: Right   [facility] MSE Discharge Disposition for Follow up and Recommendations: Based on my evaluation the patient does not appear to have an emergency medical condition and can be discharged with resources and follow up care in outpatient services for Medication Management and Individual Therapy  -provided patient with refills for Abilify 30mg /day, lexapro 20mg /day, Gabapentin 100mg  TID, -start hydroxyzine 4mf TID PRN for anxiety -discussed open access services with patient and provided patient with handout with hours of services. Encouraged patient to come to open access to establish care with therapist and psychiatric provider for future medication management.   , NP 11/25/2020, 11:06 PM

## 2020-12-25 ENCOUNTER — Other Ambulatory Visit: Payer: Self-pay

## 2020-12-25 ENCOUNTER — Ambulatory Visit (INDEPENDENT_AMBULATORY_CARE_PROVIDER_SITE_OTHER): Payer: No Payment, Other | Admitting: Physician Assistant

## 2020-12-25 VITALS — BP 145/82 | HR 88 | Ht 65.0 in | Wt 204.5 lb

## 2020-12-25 DIAGNOSIS — F411 Generalized anxiety disorder: Secondary | ICD-10-CM | POA: Diagnosis not present

## 2020-12-25 DIAGNOSIS — F410 Panic disorder [episodic paroxysmal anxiety] without agoraphobia: Secondary | ICD-10-CM

## 2020-12-25 DIAGNOSIS — F3181 Bipolar II disorder: Secondary | ICD-10-CM

## 2020-12-25 MED ORDER — LAMOTRIGINE 25 MG PO TABS: 50.0000 mg | ORAL_TABLET | Freq: Every day | ORAL | 1 refills | Status: DC

## 2020-12-25 MED ORDER — LAMOTRIGINE 25 MG PO TABS: 25.0000 mg | ORAL_TABLET | Freq: Every day | ORAL | 0 refills | Status: DC

## 2020-12-25 MED ORDER — ESCITALOPRAM OXALATE 20 MG PO TABS: 20.0000 mg | ORAL_TABLET | Freq: Every day | ORAL | 1 refills | Status: DC

## 2020-12-25 MED ORDER — ARIPIPRAZOLE 30 MG PO TABS: 30.0000 mg | ORAL_TABLET | Freq: Every day | ORAL | 1 refills | Status: DC

## 2020-12-25 MED ORDER — ESCITALOPRAM OXALATE 20 MG PO TABS: 40.0000 mg | ORAL_TABLET | Freq: Every day | ORAL | 1 refills | Status: DC

## 2020-12-25 MED ORDER — ARIPIPRAZOLE 30 MG PO TABS
30.0000 mg | ORAL_TABLET | Freq: Every day | ORAL | 1 refills | Status: DC
Start: 2020-12-25 — End: 2021-01-30
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
## 2020-12-27 ENCOUNTER — Encounter (HOSPITAL_COMMUNITY): Payer: Self-pay | Admitting: Physician Assistant

## 2020-12-30 ENCOUNTER — Other Ambulatory Visit: Payer: Self-pay

## 2020-12-30 ENCOUNTER — Emergency Department (HOSPITAL_COMMUNITY)
Admission: EM | Admit: 2020-12-30 | Discharge: 2020-12-30 | Disposition: A | Discharge status: Home / Self Care | Payer: Self-pay | Attending: Physician Assistant | Admitting: Physician Assistant

## 2020-12-30 ENCOUNTER — Encounter (HOSPITAL_COMMUNITY): Payer: Self-pay

## 2020-12-30 DIAGNOSIS — R0981 Nasal congestion: Secondary | ICD-10-CM | POA: Insufficient documentation

## 2020-12-30 DIAGNOSIS — Z794 Long term (current) use of insulin: Secondary | ICD-10-CM | POA: Insufficient documentation

## 2020-12-30 DIAGNOSIS — Z7984 Long term (current) use of oral hypoglycemic drugs: Secondary | ICD-10-CM | POA: Insufficient documentation

## 2020-12-30 DIAGNOSIS — H9201 Otalgia, right ear: Secondary | ICD-10-CM | POA: Insufficient documentation

## 2020-12-30 DIAGNOSIS — E119 Type 2 diabetes mellitus without complications: Secondary | ICD-10-CM | POA: Insufficient documentation

## 2020-12-30 MED ORDER — ETODOLAC 300 MG PO CAPS: 300.0000 mg | ORAL_CAPSULE | Freq: Two times a day (BID) | ORAL | 0 refills | Status: AC

## 2020-12-30 MED ORDER — AMOXICILLIN-POT CLAVULANATE 875-125 MG PO TABS: 1.0000 | ORAL_TABLET | Freq: Two times a day (BID) | ORAL | 0 refills | Status: AC

## 2020-12-30 NOTE — Discharge Instructions (Addendum)
You were given a prescription for antibiotics. Please take the antibiotic prescription fully.   Take tylenol and etodolac as directed for pain.   Please follow up with your ear nose and throat within 5-7 days for re-evaluation of your symptoms. If you do not have a primary care provider, information for a healthcare clinic has been provided for you to make arrangements for follow up care. Please return to the emergency department for any new or worsening symptoms.

## 2020-12-30 NOTE — ED Triage Notes (Signed)
Pt c/o right ear pain and drainage.

## 2020-12-30 NOTE — ED Provider Notes (Signed)
[facility]-EMERGENCY DEPT Provider Note   CSN: [id number] Arrival date & time: 12/30/20  1754     History Chief Complaint  Patient presents with   Right Ear Pain    Danielle Barker is a 45 y.o. female.  HPI  45 year old female with a history of diabetes, hypercholesterolemia, hypertriglyceridemia, mood disorder, who presents to the emergency department today for evaluation of right ear pain.  States pain has been present for the last few days.  She is had some drainage from the right ear as well.  She denies any fevers.  She has had bit of congestion.  She states that this is happened to her several times before and she has required Augmentin.  Past Medical History:  Diagnosis Date   Diabetes mellitus without complication (HCC)    High cholesterol    High triglycerides    Mood disorder [facility])     Patient Active Problem List   Diagnosis Date Noted   Bipolar 2 disorder (HCC) 12/25/2020   Generalized anxiety disorder with panic attacks 12/25/2020    Past Surgical History:  Procedure Laterality Date   APPENDECTOMY     CHOLECYSTECTOMY     TONSILLECTOMY       OB History   No obstetric history on file.     History reviewed. No pertinent family history.  Social History   Tobacco Use   Smoking status: Never  Substance Use Topics   Alcohol use: Yes   Drug use: No    Home Medications Prior to Admission medications   Medication Sig Start Date End Date Taking? Authorizing Provider  amoxicillin-clavulanate (AUGMENTIN) 875-125 MG tablet Take 1 tablet by mouth 2 (two) times daily for 7 days. 12/30/20 01/06/21 Yes Khrystina Bonnes S, PA-C  etodolac (LODINE) 300 MG capsule Take 1 capsule (300 mg total) by mouth 2 (two) times daily for 7 days. 12/30/20 01/06/21 Yes Judson Tsan S, PA-C  ARIPiprazole (ABILIFY) 30 MG tablet Take 1 tablet (30 mg total) by mouth daily. 12/25/20   Nwoko, Tommas Olp, PA  escitalopram (LEXAPRO) 20 MG tablet Take 2 tablets (40 mg  total) by mouth daily. 12/25/20   Nwoko, Tommas Olp, PA  fenofibrate (TRICOR) 48 MG tablet Take 1 tablet (48 mg total) by mouth daily. 09/12/20 10/12/20  Terald Sleeper, MD  gabapentin (NEURONTIN) 100 MG capsule Take 1 capsule (100 mg total) by mouth 3 (three) times daily. 11/25/20   Cecilio Asper A, NP  glimepiride (AMARYL) 2 MG tablet Take 1 tablet (2 mg total) by mouth daily with breakfast. 09/12/20 10/12/20  Terald Sleeper, MD  hydrOXYzine (ATARAX/VISTARIL) 25 MG tablet Take 1 tablet (25 mg total) by mouth 3 (three) times daily as needed for anxiety. 11/25/20   Ajibola, Ene A, NP  insulin aspart (NOVOLOG) 100 UNIT/ML injection Inject into the skin 3 (three) times daily with meals.    [provider]  insulin detemir (LEVEMIR) 100 UNIT/ML injection Inject 100 Units into the skin daily.    [provider]  lamoTRIgine (LAMICTAL) 25 MG tablet Take 1 tablet (25 mg total) by mouth daily. 12/25/20 12/25/21  Nwoko, Tommas Olp, PA  lamoTRIgine (LAMICTAL) 25 MG tablet Take 2 tablets (50 mg total) by mouth daily. 01/08/21 01/08/22  Nwoko, Tommas Olp, PA  lisinopril (PRINIVIL,ZESTRIL) 5 MG tablet Take 5 mg by mouth daily.    [provider]  LORazepam (ATIVAN) 1 MG tablet Take 1 tablet (1 mg total) by mouth every 8 (eight) hours as needed  for up to 10 doses (Nausea). 09/12/20   Terald Sleeper, MD  metFORMIN (GLUCOPHAGE) 1000 MG tablet Take 1,000 mg by mouth 2 (two) times daily with a meal.    [provider]  naproxen (NAPROSYN) 500 MG tablet Take 1 tablet (500 mg total) by mouth 2 (two) times daily. 09/18/20   Sabas Sous, MD  nitroGLYCERIN (NITROSTAT) 0.4 MG SL tablet Place 1 tablet (0.4 mg total) under the tongue every 5 (five) minutes as needed for chest pain. Up to 2 doses 09/12/20   Terald Sleeper, MD  rosuvastatin (CRESTOR) 20 MG tablet Take 1 tablet (20 mg total) by mouth daily for 30 doses. 09/12/20 10/12/20  Terald Sleeper, MD  citalopram (CELEXA) 40 MG tablet Take  40 mg by mouth daily.  11/25/20  [provider]    Allergies    Compazine [prochlorperazine], Reglan [metoclopramide], and Zofran [ondansetron]  Review of Systems   Review of Systems  Constitutional:  Negative for fever.  HENT:  Positive for congestion and ear pain.   Respiratory:  Negative for cough.    Physical Exam Updated Vital Signs BP 129/75   Pulse 79   Temp 97.9 F (36.6 C) (Oral)   Resp 18   SpO2 99%   Physical Exam Vitals and nursing note reviewed.  Constitutional:      General: She is not in acute distress.    Appearance: She is well-developed.  HENT:     Head: Normocephalic and atraumatic.     Ears:     Comments: Left TM appears scarred.  Right TM appears to be ruptured.  There is a scant amount of cerumen noted in the canal. Eyes:     Conjunctiva/sclera: Conjunctivae normal.  Cardiovascular:     Rate and Rhythm: Normal rate.  Pulmonary:     Effort: Pulmonary effort is normal.  Musculoskeletal:        General: Normal range of motion.     Cervical back: Neck supple.  Skin:    General: Skin is warm and dry.  Neurological:     Mental Status: She is alert.    ED Results / Procedures / Treatments   Labs (all labs ordered are listed, but only abnormal results are displayed) Labs Reviewed - No data to display  EKG None  Radiology No results found.  Procedures Procedures   Medications Ordered in ED Medications - No data to display  ED Course  I have reviewed the triage vital signs and the nursing notes.  Pertinent labs & imaging results that were available during my care of the patient were reviewed by me and considered in my medical decision making (see chart for details).    MDM Rules/Calculators/A&P                          45 year old female presenting the emergency department today for evaluation of pain to the right ear ongoing for the last several days.  On evaluation it appears that her right TM has ruptured.  Her history  suggest this as well.  We will start her on Augmentin.  I given her information to follow-up with the ear nose and throat doctor.  She is advised to return to the emergency department for any new or worsening symptoms.  All questions answered.  Patient stable for discharge.   Final Clinical Impression(s) / ED Diagnoses Final diagnoses:  Right ear pain    Rx / DC Orders ED  Discharge Orders          Ordered    amoxicillin-clavulanate (AUGMENTIN) 875-125 MG tablet  2 times daily        12/30/20 1904    etodolac (LODINE) 300 MG capsule  2 times daily        12/30/20 1913             Rayne Du 12/30/20 1914    Lorre Nick, MD 12/30/20 2234

## 2021-01-06 ENCOUNTER — Emergency Department (HOSPITAL_COMMUNITY)
Admission: EM | Admit: 2021-01-06 | Discharge: 2021-01-06 | Disposition: A | Discharge status: Home / Self Care | Payer: Self-pay | Attending: Emergency Medicine | Admitting: Emergency Medicine

## 2021-01-06 ENCOUNTER — Encounter (HOSPITAL_COMMUNITY): Payer: Self-pay

## 2021-01-06 ENCOUNTER — Other Ambulatory Visit: Payer: Self-pay

## 2021-01-06 DIAGNOSIS — E119 Type 2 diabetes mellitus without complications: Secondary | ICD-10-CM | POA: Insufficient documentation

## 2021-01-06 DIAGNOSIS — L299 Pruritus, unspecified: Secondary | ICD-10-CM | POA: Insufficient documentation

## 2021-01-06 DIAGNOSIS — T7840XA Allergy, unspecified, initial encounter: Secondary | ICD-10-CM | POA: Insufficient documentation

## 2021-01-06 DIAGNOSIS — Z760 Encounter for issue of repeat prescription: Secondary | ICD-10-CM | POA: Insufficient documentation

## 2021-01-06 DIAGNOSIS — Z794 Long term (current) use of insulin: Secondary | ICD-10-CM | POA: Insufficient documentation

## 2021-01-06 DIAGNOSIS — Z7984 Long term (current) use of oral hypoglycemic drugs: Secondary | ICD-10-CM | POA: Insufficient documentation

## 2021-01-06 DIAGNOSIS — R21 Rash and other nonspecific skin eruption: Secondary | ICD-10-CM | POA: Insufficient documentation

## 2021-01-06 LAB — URINALYSIS, ROUTINE W REFLEX MICROSCOPIC
Bilirubin Urine: NEGATIVE
Glucose, UA: >=500 mg/dL — AB
Ketones, ur: NEGATIVE mg/dL
Leukocytes,Ua: NEGATIVE
Nitrite: NEGATIVE
Protein, ur: NEGATIVE mg/dL
RBC / HPF: >50 RBC/hpf — ABNORMAL HIGH (ref 0–5)
Specific Gravity, Urine: >=1.03 (ref 1.005–1.030)
pH: 6 (ref 5.0–8.0)

## 2021-01-06 LAB — CBC WITH DIFFERENTIAL/PLATELET
Abs Immature Granulocytes: 0.04 10*3/uL (ref 0.00–0.07)
Basophils Absolute: 0.1 10*3/uL (ref 0.0–0.1)
Basophils Relative: 1 %
Eosinophils Absolute: 0.1 10*3/uL (ref 0.0–0.5)
Eosinophils Relative: 1 %
HCT: 38.5 % (ref 36.0–46.0)
Hemoglobin: 12.7 g/dL (ref 12.0–15.0)
Immature Granulocytes: 1 %
Lymphocytes Relative: 28 %
Lymphs Abs: 2.1 10*3/uL (ref 0.7–4.0)
MCH: 30.8 pg (ref 26.0–34.0)
MCHC: 33 g/dL (ref 30.0–36.0)
MCV: 93.2 fL (ref 80.0–100.0)
Monocytes Absolute: 0.4 10*3/uL (ref 0.1–1.0)
Monocytes Relative: 6 %
Neutro Abs: 4.8 10*3/uL (ref 1.7–7.7)
Neutrophils Relative %: 63 %
Platelets: 347 10*3/uL (ref 150–400)
RBC: 4.13 MIL/uL (ref 3.87–5.11)
RDW: 12.3 % (ref 11.5–15.5)
WBC: 7.5 10*3/uL (ref 4.0–10.5)
nRBC: 0 % (ref 0.0–0.2)

## 2021-01-06 LAB — PREGNANCY, URINE: Preg Test, Ur: NEGATIVE

## 2021-01-06 LAB — COMPREHENSIVE METABOLIC PANEL
ALT: 18 U/L (ref 0–44)
AST: 14 U/L — ABNORMAL LOW (ref 15–41)
Albumin: 3.6 g/dL (ref 3.5–5.0)
Alkaline Phosphatase: 70 U/L (ref 38–126)
Anion gap: 7 (ref 5–15)
BUN: 12 mg/dL (ref 6–20)
CO2: 24 mmol/L (ref 22–32)
Calcium: 9.4 mg/dL (ref 8.9–10.3)
Chloride: 106 mmol/L (ref 98–111)
Creatinine, Ser: 0.66 mg/dL (ref 0.44–1.00)
GFR, Estimated: >60 mL/min (ref >60)
Glucose, Bld: 174 mg/dL — ABNORMAL HIGH (ref 70–99)
Potassium: 4.1 mmol/L (ref 3.5–5.1)
Sodium: 137 mmol/L (ref 135–145)
Total Bilirubin: 0.5 mg/dL (ref 0.3–1.2)
Total Protein: 7.2 g/dL (ref 6.5–8.1)

## 2021-01-06 LAB — CBG MONITORING, ED: Glucose-Capillary: 160 mg/dL — ABNORMAL HIGH (ref 70–99)

## 2021-01-06 MED ORDER — GLIMEPIRIDE 2 MG PO TABS: 2.0000 mg | ORAL_TABLET | Freq: Every day | ORAL | 1 refills | Status: DC

## 2021-01-06 MED ORDER — DIPHENHYDRAMINE HCL 25 MG PO CAPS
50.0000 mg | ORAL_CAPSULE | Freq: Once | ORAL | Status: AC
  Administered 2021-01-06: 50 mg via ORAL
  Filled 2021-01-06: qty 2

## 2021-01-06 NOTE — ED Triage Notes (Addendum)
Patient states she started Lamictal 12 days ago. Patient states she noted itching and a rsh last night, but worsened today. Patient states her lips feel swollen and more red than usual. Patient states, "It feels like pins and needles all over my body."  Patient states she is able to swallow and breathe without difficulty.

## 2021-01-06 NOTE — ED Provider Notes (Signed)
[facility]-EMERGENCY DEPT Provider Note   CSN: [id number] Arrival date & time: 01/06/21  1405     History Chief Complaint  Patient presents with   Allergic Reaction    Danielle Barker is a 45 y.o. female presenting for concerns for allergic reaction.  Patient states she was started on Lamictal 10 days ago.  For the past couple days, she has developed a rash and generalized itching.  She states she is feeling anxious which is causing her to be short of breath, but no shortness of breath outside of anxiety.  She has been on Lamictal many years ago, thought she might of had a mild reaction at that time.  No other new medications or exposures.  Has not taken anything for it including Benadryl.  She feels like her lips are swollen.  No swelling of the throat or difficulty breathing or swallowing.  No nausea, vomiting, abdominal pain, diarrhea.  History of diabetes, on insulin, states blood sugars have been elevated recently, 300-400s.  HPI     Past Medical History:  Diagnosis Date   Diabetes mellitus without complication (HCC)    High cholesterol    High triglycerides    Mood disorder [facility])     Patient Active Problem List   Diagnosis Date Noted   Bipolar 2 disorder (HCC) 12/25/2020   Generalized anxiety disorder with panic attacks 12/25/2020    Past Surgical History:  Procedure Laterality Date   APPENDECTOMY     CHOLECYSTECTOMY     SHOULDER SURGERY Left    TONSILLECTOMY       OB History   No obstetric history on file.     Family History  Problem Relation Age of Onset   Cancer Mother    Heart failure Mother    Cancer Father     Social History   Tobacco Use   Smoking status: Never   Smokeless tobacco: Never  Vaping Use   Vaping Use: Never used  Substance Use Topics   Alcohol use: Not Currently   Drug use: Never    Home Medications Prior to Admission medications   Medication Sig Start Date End Date Taking? Authorizing Provider   amoxicillin-clavulanate (AUGMENTIN) 875-125 MG tablet Take 1 tablet by mouth 2 (two) times daily for 7 days. 12/30/20 01/06/21  Couture, Cortni S, PA-C  ARIPiprazole (ABILIFY) 30 MG tablet Take 1 tablet (30 mg total) by mouth daily. 12/25/20   Nwoko, Tommas Olp, PA  escitalopram (LEXAPRO) 20 MG tablet Take 2 tablets (40 mg total) by mouth daily. 12/25/20   Nwoko, Tommas Olp, PA  etodolac (LODINE) 300 MG capsule Take 1 capsule (300 mg total) by mouth 2 (two) times daily for 7 days. 12/30/20 01/06/21  Couture, Cortni S, PA-C  fenofibrate (TRICOR) 48 MG tablet Take 1 tablet (48 mg total) by mouth daily. 09/12/20 10/12/20  Terald Sleeper, MD  gabapentin (NEURONTIN) 100 MG capsule Take 1 capsule (100 mg total) by mouth 3 (three) times daily. 11/25/20   Ajibola, Gerrianne Scale A, NP  glimepiride (AMARYL) 2 MG tablet Take 1 tablet (2 mg total) by mouth daily with breakfast. 01/06/21 02/05/21  Chistine Dematteo, PA-C  hydrOXYzine (ATARAX/VISTARIL) 25 MG tablet Take 1 tablet (25 mg total) by mouth 3 (three) times daily as needed for anxiety. 11/25/20   Ajibola, Ene A, NP  insulin aspart (NOVOLOG) 100 UNIT/ML injection Inject into the skin 3 (three) times daily with meals.    [provider]  insulin detemir (LEVEMIR) 100 UNIT/ML  injection Inject 100 Units into the skin daily.    [provider]  lamoTRIgine (LAMICTAL) 25 MG tablet Take 1 tablet (25 mg total) by mouth daily. 12/25/20 12/25/21  Nwoko, Tommas Olp, PA  lamoTRIgine (LAMICTAL) 25 MG tablet Take 2 tablets (50 mg total) by mouth daily. 01/08/21 01/08/22  Nwoko, Tommas Olp, PA  lisinopril (PRINIVIL,ZESTRIL) 5 MG tablet Take 5 mg by mouth daily.    [provider]  LORazepam (ATIVAN) 1 MG tablet Take 1 tablet (1 mg total) by mouth every 8 (eight) hours as needed for up to 10 doses (Nausea). 09/12/20   Terald Sleeper, MD  metFORMIN (GLUCOPHAGE) 1000 MG tablet Take 1,000 mg by mouth 2 (two) times daily with a meal.    [provider]  naproxen  (NAPROSYN) 500 MG tablet Take 1 tablet (500 mg total) by mouth 2 (two) times daily. 09/18/20   Sabas Sous, MD  nitroGLYCERIN (NITROSTAT) 0.4 MG SL tablet Place 1 tablet (0.4 mg total) under the tongue every 5 (five) minutes as needed for chest pain. Up to 2 doses 09/12/20   Terald Sleeper, MD  rosuvastatin (CRESTOR) 20 MG tablet Take 1 tablet (20 mg total) by mouth daily for 30 doses. 09/12/20 10/12/20  Terald Sleeper, MD  citalopram (CELEXA) 40 MG tablet Take 40 mg by mouth daily.  11/25/20  [provider]    Allergies    Compazine [prochlorperazine], Reglan [metoclopramide], and Zofran [ondansetron]  Review of Systems   Review of Systems  Skin:  Positive for rash.  Psychiatric/Behavioral:  The patient is nervous/anxious.   All other systems reviewed and are negative.  Physical Exam Updated Vital Signs BP 126/78   Pulse 66   Temp 97.7 F (36.5 C) (Oral)   Resp 16   Ht 5\' 5"  (1.651 m)   Wt 92.5 kg   LMP 01/06/2021   SpO2 100%   BMI 33.95 kg/m   Physical Exam Vitals and nursing note reviewed.  Constitutional:      General: She is not in acute distress.    Appearance: Normal appearance.     Comments: nontoxic  HENT:     Head: Normocephalic and atraumatic.     Comments: May be some mild swelling of the lower lip on the right side.  OP clear without swelling or signs of airway compromise.  Handling secretions easily. Eyes:     Conjunctiva/sclera: Conjunctivae normal.     Pupils: Pupils are equal, round, and reactive to light.  Cardiovascular:     Rate and Rhythm: Normal rate and regular rhythm.     Pulses: Normal pulses.  Pulmonary:     Effort: Pulmonary effort is normal. No respiratory distress.     Breath sounds: Normal breath sounds. No wheezing.     Comments: Speaking in full sentences.  Clear lung sounds in all fields. Abdominal:     General: There is no distension.     Palpations: Abdomen is soft.     Tenderness: There is no abdominal tenderness.  There is no guarding or rebound.  Musculoskeletal:        General: Normal range of motion.     Cervical back: Normal range of motion and neck supple.  Skin:    General: Skin is warm and dry.     Capillary Refill: Capillary refill takes less than 2 seconds.     Findings: Rash present.     Comments: Scattered mild erythematous rash noted of upper extremities bilaterally.  Neurological:     Mental Status: She is alert and oriented to person, place, and time.  Psychiatric:        Mood and Affect: Mood and affect normal.        Speech: Speech normal.        Behavior: Behavior normal.    ED Results / Procedures / Treatments   Labs (all labs ordered are listed, but only abnormal results are displayed) Labs Reviewed  COMPREHENSIVE METABOLIC PANEL - Abnormal; Notable for the following components:      Result Value   Glucose, Bld 174 (*)    AST 14 (*)    All other components within normal limits  URINALYSIS, ROUTINE W REFLEX MICROSCOPIC - Abnormal; Notable for the following components:   APPearance HAZY (*)    Glucose, UA >=500 (*)    Hgb urine dipstick LARGE (*)    RBC / HPF >50 (*)    Bacteria, UA FEW (*)    All other components within normal limits  CBG MONITORING, ED - Abnormal; Notable for the following components:   Glucose-Capillary 160 (*)    All other components within normal limits  CBC WITH DIFFERENTIAL/PLATELET  PREGNANCY, URINE    EKG None  Radiology No results found.  Procedures Procedures   Medications Ordered in ED Medications  diphenhydrAMINE (BENADRYL) capsule 50 mg (50 mg Oral Given 01/06/21 1559)    ED Course  I have reviewed the triage vital signs and the nursing notes.  Pertinent labs & imaging results that were available during my care of the patient were reviewed by me and considered in my medical decision making (see chart for details).    MDM Rules/Calculators/A&P                           Patient presenting for evaluation of generalized  itching.  On exam, patient appears nontoxic.  No airway compromise.  There is mild swelling of the lower lip.  Rash over the upper extremities.  Will treat with Benadryl.  As patient has had elevated sugars, will obtain labs and urine to ensure no acidosis.  Labs interpreted by me, overall reassuring.  Urine shows blood, however patient is on her period.  No indications of or symptoms of UTI or kidney stone.  On reevaluation, symptoms are improved, itching is improved.  Discussed findings with patient.  Discussed treatment with H1 and H2 blocker.  Discussed follow-up with psychiatrist as Lamictal is probably not a good medicine for her.  Discussed importance of establishing with a primary care doctor, patient does not yet have insurance.  At this time, patient appears safe for discharge.  Return precautions given.  Patient states she understands and agrees to plan  Final Clinical Impression(s) / ED Diagnoses Final diagnoses:  Allergic reaction, initial encounter  Medication refill    Rx / DC Orders ED Discharge Orders          Ordered    glimepiride (AMARYL) 2 MG tablet  Daily with breakfast        01/06/21 1738             Jordie Schreur, PA-C 01/06/21 1810    Terrilee Files, MD 01/06/21 2132

## 2021-01-06 NOTE — Discharge Instructions (Signed)
Continue taking home medications as prescribed. Your Amaryl was refilled with 1 refill at the pharmacy.  I recommend you start calling primary care offices now to set up an appointment so that you can see them when you get insurance. Stop Lamictal, but follow-up with your psychiatrist for further management. Take Zyrtec once a day and Pepcid twice a day for the next week.  Use Benadryl as needed for breakthrough itching. Return to the emergency room with any new, worsening, or concerning symptoms

## 2021-01-24 ENCOUNTER — Emergency Department (HOSPITAL_COMMUNITY)
Admission: EM | Admit: 2021-01-24 | Discharge: 2021-01-24 | Disposition: A | Discharge status: Home / Self Care | Payer: Self-pay | Attending: Emergency Medicine | Admitting: Emergency Medicine

## 2021-01-24 ENCOUNTER — Encounter (HOSPITAL_COMMUNITY): Payer: Self-pay

## 2021-01-24 ENCOUNTER — Other Ambulatory Visit: Payer: Self-pay

## 2021-01-24 DIAGNOSIS — Z5321 Procedure and treatment not carried out due to patient leaving prior to being seen by health care provider: Secondary | ICD-10-CM | POA: Insufficient documentation

## 2021-01-24 DIAGNOSIS — E1165 Type 2 diabetes mellitus with hyperglycemia: Secondary | ICD-10-CM | POA: Insufficient documentation

## 2021-01-24 LAB — CBG MONITORING, ED: Glucose-Capillary: 340 mg/dL — ABNORMAL HIGH (ref 70–99)

## 2021-01-24 NOTE — ED Triage Notes (Signed)
Patient does not have primary care. She is worried about keeping her blood sugars under control. Patient has type 2 diabetes. Patients last CBG 334 around 20 minutes ago. She feels really thirsty, like her sugar is high, she is under a lot of stress. She is also requesting medication for anxiety.

## 2021-01-29 ENCOUNTER — Telehealth (HOSPITAL_COMMUNITY): Payer: Self-pay | Admitting: Physician Assistant

## 2021-01-29 NOTE — Telephone Encounter (Signed)
Patient called requesting sooner appt due to reactions to medications. Next appointment is with Link Snuffer 02/06/21 but states will come as walk-in for change medication.

## 2021-01-30 ENCOUNTER — Encounter (HOSPITAL_COMMUNITY): Payer: Self-pay | Admitting: Emergency Medicine

## 2021-01-30 ENCOUNTER — Other Ambulatory Visit: Payer: Self-pay

## 2021-01-30 ENCOUNTER — Encounter (HOSPITAL_COMMUNITY): Payer: Self-pay | Admitting: Physician Assistant

## 2021-01-30 ENCOUNTER — Ambulatory Visit (INDEPENDENT_AMBULATORY_CARE_PROVIDER_SITE_OTHER): Payer: No Payment, Other | Admitting: Physician Assistant

## 2021-01-30 ENCOUNTER — Emergency Department (HOSPITAL_COMMUNITY)
Admission: EM | Admit: 2021-01-30 | Discharge: 2021-01-30 | Disposition: A | Discharge status: Home / Self Care | Payer: Self-pay | Attending: Emergency Medicine | Admitting: Emergency Medicine

## 2021-01-30 VITALS — BP 128/82 | HR 80 | Ht 65.0 in | Wt 202.0 lb

## 2021-01-30 DIAGNOSIS — F411 Generalized anxiety disorder: Secondary | ICD-10-CM | POA: Diagnosis not present

## 2021-01-30 DIAGNOSIS — Z5321 Procedure and treatment not carried out due to patient leaving prior to being seen by health care provider: Secondary | ICD-10-CM | POA: Insufficient documentation

## 2021-01-30 DIAGNOSIS — F3181 Bipolar II disorder: Secondary | ICD-10-CM

## 2021-01-30 DIAGNOSIS — F410 Panic disorder [episodic paroxysmal anxiety] without agoraphobia: Secondary | ICD-10-CM

## 2021-01-30 DIAGNOSIS — E1165 Type 2 diabetes mellitus with hyperglycemia: Secondary | ICD-10-CM | POA: Insufficient documentation

## 2021-01-30 LAB — BASIC METABOLIC PANEL
Anion gap: 10 (ref 5–15)
BUN: 19 mg/dL (ref 6–20)
CO2: 24 mmol/L (ref 22–32)
Calcium: 9.1 mg/dL (ref 8.9–10.3)
Chloride: 99 mmol/L (ref 98–111)
Creatinine, Ser: 0.7 mg/dL (ref 0.44–1.00)
GFR, Estimated: >60 mL/min (ref >60)
Glucose, Bld: 331 mg/dL — ABNORMAL HIGH (ref 70–99)
Potassium: 4.2 mmol/L (ref 3.5–5.1)
Sodium: 133 mmol/L — ABNORMAL LOW (ref 135–145)

## 2021-01-30 LAB — URINALYSIS, ROUTINE W REFLEX MICROSCOPIC
Bacteria, UA: NONE SEEN
Bilirubin Urine: NEGATIVE
Glucose, UA: >=500 mg/dL — AB
Hgb urine dipstick: NEGATIVE
Ketones, ur: 5 mg/dL — AB
Leukocytes,Ua: NEGATIVE
Nitrite: NEGATIVE
Protein, ur: NEGATIVE mg/dL
Specific Gravity, Urine: 1.033 — ABNORMAL HIGH (ref 1.005–1.030)
pH: 5 (ref 5.0–8.0)

## 2021-01-30 LAB — CBC
HCT: 39.8 % (ref 36.0–46.0)
Hemoglobin: 13.3 g/dL (ref 12.0–15.0)
MCH: 30.6 pg (ref 26.0–34.0)
MCHC: 33.4 g/dL (ref 30.0–36.0)
MCV: 91.7 fL (ref 80.0–100.0)
Platelets: 367 10*3/uL (ref 150–400)
RBC: 4.34 MIL/uL (ref 3.87–5.11)
RDW: 12.4 % (ref 11.5–15.5)
WBC: 8.7 10*3/uL (ref 4.0–10.5)
nRBC: 0 % (ref 0.0–0.2)

## 2021-01-30 LAB — CBG MONITORING, ED: Glucose-Capillary: 309 mg/dL — ABNORMAL HIGH (ref 70–99)

## 2021-01-30 MED ORDER — ARIPIPRAZOLE 30 MG PO TABS
30.0000 mg | ORAL_TABLET | Freq: Every day | ORAL | 1 refills | Status: DC
  Filled 2021-01-30 – 2021-02-11 (×2): qty 30, 30d supply, fill #0
  Filled 2021-03-18 – 2021-03-26 (×2): qty 30, 30d supply, fill #1

## 2021-01-30 MED ORDER — CLONAZEPAM 0.5 MG PO TABS: 0.5000 mg | ORAL_TABLET | Freq: Two times a day (BID) | ORAL | 0 refills | Status: DC | PRN

## 2021-01-30 MED ORDER — ESCITALOPRAM OXALATE 20 MG PO TABS
40.0000 mg | ORAL_TABLET | Freq: Every day | ORAL | 1 refills | Status: DC
Start: 2021-01-30 — End: 2021-06-23
  Filled 2021-01-30 – 2021-05-05 (×3): qty 60, 30d supply, fill #0

## 2021-01-30 NOTE — Progress Notes (Signed)
BH MD/PA/NP OP Progress Note  01/30/2021 8:57 PM Danielle Barker  MRN:  [id number]  Chief Complaint:  Chief Complaint   Stress    HPI:   Danielle Barker is a 45 year old female with a past psychiatric history significant for bipolar 2 disorder and generalized anxiety disorder who presents to [facility] for medication management.  Patient is currently being managed on the following medications:  Lamotrigine 50 mg daily Abilify 30 mg daily Escitalopram 40 mg daily  Patient reports that she recently discontinued taking Lamictal due to experiencing hives all over her body.  Patient also reports that her stress level has been so severe that she recently lost her position at a [employer].  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  Currently, patient states that she is unable to calm down and always feels like she is on the go.  Patient also endorses anxiety along with restless thoughts.  She expresses that although she is going through worsening anxiety, her depression has not been bad.  For the management of her anxiety, patient has been on hydroxyzine and buspirone.  She reports that hydroxyzine was not effective in managing her anxiety and she believes that she did not react well to buspirone in the past.  Patient is also taking gabapentin but states that it often made her sleepy.  The only medication patient agrees has been helpful has been benzodiazepines.  She states that she was on Xanax 1 mg 3 times daily for years as well as Ativan 1 mg 3 times daily.  Patient rates her anxiety a 9 out of 10.  Patient's stressors include: financial stability and current health.  A GAD-7 screen was performed with the patient scoring a 19.  Patient is alert and oriented x4, calm, cooperative, and fully engaged in conversation during the encounter.  Patient endorses okay mood.  She denies suicidal or  homicidal ideations.  She further denies auditory or visual hallucinations and does not appear to be responding to internal/external stimuli.  Patient endorses fair sleep and receives on average 4 to 6 hours of intermittent sleep.  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  Patient denies illicit drug use, alcohol consumption, and tobacco use.  Visit Diagnosis:    ICD-10-CM   1. Generalized anxiety disorder with panic attacks  F41.1 clonazePAM (KLONOPIN) 0.5 MG tablet   F41.0 escitalopram (LEXAPRO) 20 MG tablet    2. Bipolar 2 disorder (HCC)  F31.81 ARIPiprazole (ABILIFY) 30 MG tablet    escitalopram (LEXAPRO) 20 MG tablet      Past Psychiatric History:  Bipolar 2 disorder Generalized anxiety disorder Panic attacks  Past Medical History:  Past Medical History:  Diagnosis Date   Diabetes mellitus without complication (HCC)    High cholesterol    High triglycerides    Mood disorder (HCC)     Past Surgical History:  Procedure Laterality Date   APPENDECTOMY     CHOLECYSTECTOMY     SHOULDER SURGERY Left    TONSILLECTOMY      Family Psychiatric History:  Aunt (maternal) - schizophrenic Mother - unipolar/severe depression Father - Alcohol abuse, anxiety, possible bipolar but has not been diagnosed Advertising copywriter - anxious, paranoid Biological Sister - Anxiety, recovering alcoholic (sober for 4 years)  Family History:  Family History  Problem Relation Age of Onset   Cancer Mother    Heart failure Mother  Cancer Father     Social History:  Social History   Socioeconomic History   Marital status: Divorced    Spouse name: Not on file   Number of children: Not on file   Years of education: Not on file   Highest education level: Not on file  Occupational History   Not on file  Tobacco Use   Smoking status: Never   Smokeless tobacco: Never  Vaping Use   Vaping Use: Never used  Substance and Sexual Activity   Alcohol use: Not  Currently   Drug use: Never   Sexual activity: Not on file  Other Topics Concern   Not on file  Social History Narrative   Not on file   Social Determinants of Health   Financial Resource Strain: Not on file  Food Insecurity: Not on file  Transportation Needs: Not on file  Physical Activity: Not on file  Stress: Not on file  Social Connections: Not on file    Allergies:  Allergies  Allergen Reactions   Compazine [Prochlorperazine]    Reglan [Metoclopramide]    Zofran [Ondansetron]     Metabolic Disorder Labs: No results found for: HGBA1C, MPG No results found for: PROLACTIN No results found for: CHOL, TRIG, HDL, CHOLHDL, VLDL, LDLCALC No results found for: TSH  Therapeutic Level Labs: No results found for: LITHIUM No results found for: VALPROATE No components found for:  CBMZ  Current Medications: Current Outpatient Medications  Medication Sig Dispense Refill   clonazePAM (KLONOPIN) 0.5 MG tablet Take 1 tablet (0.5 mg total) by mouth 2 (two) times daily as needed for anxiety. 60 tablet 0   gabapentin (NEURONTIN) 100 MG capsule Take 1 capsule (100 mg total) by mouth 3 (three) times daily. 90 capsule 0   glimepiride (AMARYL) 2 MG tablet Take 1 tablet (2 mg total) by mouth daily with breakfast. 30 tablet 1   hydrOXYzine (ATARAX/VISTARIL) 25 MG tablet Take 1 tablet (25 mg total) by mouth 3 (three) times daily as needed for anxiety. 60 tablet 0   insulin aspart (NOVOLOG) 100 UNIT/ML injection Inject into the skin 3 (three) times daily with meals.     insulin detemir (LEVEMIR) 100 UNIT/ML injection Inject 100 Units into the skin daily.     lamoTRIgine (LAMICTAL) 25 MG tablet Take 1 tablet (25 mg total) by mouth daily. 14 tablet 0   lisinopril (PRINIVIL,ZESTRIL) 5 MG tablet Take 5 mg by mouth daily.     metFORMIN (GLUCOPHAGE) 1000 MG tablet Take 1,000 mg by mouth 2 (two) times daily with a meal.     naproxen (NAPROSYN) 500 MG tablet Take 1 tablet (500 mg total) by mouth 2  (two) times daily. 30 tablet 0   nitroGLYCERIN (NITROSTAT) 0.4 MG SL tablet Place 1 tablet (0.4 mg total) under the tongue every 5 (five) minutes as needed for chest pain. Up to 2 doses 30 tablet 0   ARIPiprazole (ABILIFY) 30 MG tablet Take 1 tablet (30 mg total) by mouth daily. 30 tablet 1   escitalopram (LEXAPRO) 20 MG tablet Take 2 tablets (40 mg total) by mouth daily. 60 tablet 1   fenofibrate (TRICOR) 48 MG tablet Take 1 tablet (48 mg total) by mouth daily. 30 tablet 2   rosuvastatin (CRESTOR) 20 MG tablet Take 1 tablet (20 mg total) by mouth daily for 30 doses. 30 tablet 2   No current facility-administered medications for this visit.     Musculoskeletal: Strength & Muscle Tone: within normal limits Gait & Station:  normal Patient leans: N/A  Psychiatric Specialty Exam: Review of Systems  Psychiatric/Behavioral:  Positive for sleep disturbance. Negative for decreased concentration, dysphoric mood, hallucinations, self-injury and suicidal ideas. The patient is nervous/anxious. The patient is not hyperactive.    Blood pressure 128/82, pulse 80, height 5\' 5"  (1.651 m), weight 202 lb (91.6 kg), last menstrual period 01/06/2021.Body mass index is 33.61 kg/m.  General Appearance: Well Groomed  Eye Contact:  Good  Speech:  Clear and Coherent and Normal Rate  Volume:  Normal  Mood:  Anxious and Euthymic  Affect:  Appropriate and Congruent  Thought Process:  Coherent, Goal Directed, and Descriptions of Associations: Intact  Orientation:  Full (Time, Place, and Person)  Thought Content: WDL   Suicidal Thoughts:  No  Homicidal Thoughts:  No  Memory:  Immediate;   Good Recent;   Good Remote;   Good  Judgement:  Good  Insight:  Good  Psychomotor Activity:  Restlessness  Concentration:  Concentration: Good and Attention Span: Good  Recall:  Good  Fund of Knowledge: Good  Language: Good  Akathisia:  NA  Handed:  Right  AIMS (if indicated): not done  Assets:  Desire for  Improvement Financial Resources/Insurance Housing Social Support Transportation Vocational/Educational  ADL's:  Intact  Cognition: WNL  Sleep:  Fair   Screenings: GAD-7    Flowsheet Row Clinical Support from 01/30/2021 in [facility] Office Visit from 12/25/2020 in [facility]  Total GAD-7 Score 19 15      PHQ2-9    Flowsheet Row Clinical Support from 01/30/2021 in [facility] Office Visit from 12/25/2020 in [facility]  PHQ-2 Total Score 0 3  PHQ-9 Total Score -- 13      Flowsheet Row Clinical Support from 01/30/2021 in [facility] Most recent reading at 01/30/2021  9:09 AM ED from 01/30/2021 in [facility] EMERGENCY DEPARTMENT Most recent reading at 01/30/2021  4:04 AM ED from 01/24/2021 in [facility] Most recent reading at 01/24/2021  1:25 AM  C-SSRS RISK CATEGORY No Risk No Risk No Risk        Assessment and Plan:   Danielle Barker is a 45 year old female with a past psychiatric history significant for bipolar 2 disorder and generalized anxiety disorder who presents to [facility] for medication management.  Patient states that she discontinued taking lamotrigine due to experiencing hives.  Patient reports that she is still dealing with worsening anxiety.  Patient has been on hydroxyzine, buspirone, and gabapentin for the management of her anxiety in the past but states that none of the medications were helpful.  Patient has only seen success with the use of benzodiazepines.  Patient was recommended Klonopin 0.5 mg 3 times daily as needed for the management of her anxiety.  All other medications shall remain the same dosage.  Patient's medication to be e-prescribed to pharmacy of choice.  1. Generalized anxiety disorder with panic attacks  -  clonazePAM (KLONOPIN) 0.5 MG tablet; Take 1 tablet (0.5 mg total) by mouth 2 (two) times daily as needed for anxiety.  Dispense: 60 tablet; Refill: 0 - escitalopram (LEXAPRO) 20 MG tablet; Take 2 tablets (40 mg total) by mouth daily.  Dispense: 60 tablet; Refill: 1  2. Bipolar 2 disorder (HCC)  - ARIPiprazole (ABILIFY) 30 MG tablet; Take 1 tablet (30 mg total) by mouth daily.  Dispense: 30 tablet; Refill:  1 - escitalopram (LEXAPRO) 20 MG tablet; Take 2 tablets (40 mg total) by mouth daily.  Dispense: 60 tablet; Refill: 1  Patient to follow up on appointment scheduled for October 13th at 5:00pm Provider spent a total of 17 minutes with the patient/reviewing patient's chart  Meta Hatchet, PA 01/30/2021, 8:57 PM

## 2021-01-30 NOTE — Telephone Encounter (Signed)
Patient was seen during walk-in today.  Klonopin 0.5 mg 2 times daily as needed was added onto the patient's medication regimen for the management of worsening anxiety.  Patient to keep appointment scheduled for 02/06/2021.

## 2021-01-30 NOTE — ED Triage Notes (Signed)
Pt in with c/o hyperglycemia (300-400's) x past week. States hx of DMII, unable to keep sugars <300 with her 70/30 and Regular insulin. Reports some HA, blurred vision as well. Denies any cp, n/v or sob. CBG 309 in triage

## 2021-01-30 NOTE — ED Notes (Signed)
Pt wants to leave, aware of risks of leaving without being seen

## 2021-01-31 ENCOUNTER — Other Ambulatory Visit: Payer: Self-pay

## 2021-02-06 ENCOUNTER — Other Ambulatory Visit: Payer: Self-pay

## 2021-02-06 ENCOUNTER — Telehealth (HOSPITAL_COMMUNITY): Payer: No Payment, Other | Admitting: Physician Assistant

## 2021-02-07 ENCOUNTER — Other Ambulatory Visit: Payer: Self-pay

## 2021-02-11 ENCOUNTER — Other Ambulatory Visit: Payer: Self-pay

## 2021-02-12 ENCOUNTER — Ambulatory Visit (INDEPENDENT_AMBULATORY_CARE_PROVIDER_SITE_OTHER): Payer: No Payment, Other | Admitting: Physician Assistant

## 2021-02-12 ENCOUNTER — Other Ambulatory Visit: Payer: Self-pay

## 2021-02-12 VITALS — BP 136/78 | HR 74 | Ht 65.0 in | Wt 211.0 lb

## 2021-02-12 DIAGNOSIS — F411 Generalized anxiety disorder: Secondary | ICD-10-CM | POA: Diagnosis not present

## 2021-02-12 DIAGNOSIS — G4700 Insomnia, unspecified: Secondary | ICD-10-CM | POA: Diagnosis not present

## 2021-02-12 DIAGNOSIS — F3181 Bipolar II disorder: Secondary | ICD-10-CM | POA: Diagnosis not present

## 2021-02-12 DIAGNOSIS — F410 Panic disorder [episodic paroxysmal anxiety] without agoraphobia: Secondary | ICD-10-CM | POA: Diagnosis not present

## 2021-02-12 MED ORDER — CLONAZEPAM 1 MG PO TABS: 1.0000 mg | ORAL_TABLET | Freq: Two times a day (BID) | ORAL | 0 refills | Status: DC | PRN

## 2021-02-12 MED ORDER — TRAZODONE HCL 50 MG PO TABS
50.0000 mg | ORAL_TABLET | Freq: Every evening | ORAL | 1 refills | Status: DC | PRN
  Filled 2021-02-12: qty 30, 30d supply, fill #0

## 2021-02-12 NOTE — Progress Notes (Addendum)
BH MD/PA/NP OP Progress Note  02/12/2021 8:29 AM Danielle Barker  MRN:  [id number]  Chief Complaint:  Chief Complaint   Stress    HPI:   Danielle Barker is a 45 year old female with a past psychiatric history significant for insomnia, generalized anxiety disorder with panic attacks, and bipolar 2 disorder who presents to [facility] for follow-up and medication management.  Patient is currently being managed on the following medications:  Clonazepam (Klonopin) 0.5 mg 2 times daily as needed Abilify 30 mg daily Escitalopram (Lexapro) 40 mg daily  Patient presents to clinic with elevated anxiety.  Patient rates her anxiety a 9 out of 10 and attributes her increased anxiety to a number of stressors in her life.  Patient endorses the following stressors: financial instability, trying to find a new job, family drama involving her father, and her nephew being bullied.  The bullying of her nephew impacts the patient greatly due to her close connection with him.  Patient does note that she recently had an interview with [employer] and feels that she may have gotten a job.  Patient states that she is not depressed or forlorn but she states that her anxiety keeps her from working.  Patient states that she will try to use 1 Klonopin to help with her anxiety but it does not appear to help out.  Patient has also had issues with her sleep is currently taking melatonin.  She states that melatonin will put her to sleep but want allow her to stay asleep.  Patient reports that she took trazodone several years ago and is comfortable with rechallenge of the medication.  A GAD-7 screen was performed with the patient scoring to 20.  Patient is alert and oriented x4, calm, cooperative, and fully engaged in conversation during the encounter.  Patient states that she feels happy but is unable to relax.  Patient denies suicidal or homicidal ideations.  She further denies  auditory or visual hallucinations and does not appear to be responding to internal/external stimuli.  Patient endorses fair sleep and receives on average 4 to 5 hours of intermittent sleep.  Patient endorses good appetite and eats on average 2-3 meals per day.  Patient denies alcohol consumption, tobacco use, and illicit drug use.  Visit Diagnosis:    ICD-10-CM   1. Insomnia, unspecified type  G47.00 traZODone (DESYREL) 50 MG tablet    2. Generalized anxiety disorder with panic attacks  F41.1 clonazePAM (KLONOPIN) 1 MG tablet   F41.0     3. Bipolar 2 disorder (HCC)  F31.81       Past Psychiatric History:  Insomnia Generalized anxiety disorder Bipolar 2 disorder  Past Medical History:  Past Medical History:  Diagnosis Date   Diabetes mellitus without complication (HCC)    High cholesterol    High triglycerides    Mood disorder (HCC)     Past Surgical History:  Procedure Laterality Date   APPENDECTOMY     CHOLECYSTECTOMY     SHOULDER SURGERY Left    TONSILLECTOMY      Family Psychiatric History:  Aunt (maternal) - schizophrenic Mother - unipolar/severe depression Father - Alcohol abuse, anxiety, possible bipolar but has not been diagnosed Advertising copywriter - anxious, paranoid Biological Sister - Anxiety, recovering alcoholic (sober for 4 years)  Family History:  Family History  Problem Relation Age of Onset   Cancer Mother    Heart failure Mother    Cancer Father     Social History:  Social History   Socioeconomic History   Marital status: Divorced    Spouse name: Not on file   Number of children: Not on file   Years of education: Not on file   Highest education level: Not on file  Occupational History   Not on file  Tobacco Use   Smoking status: Never   Smokeless tobacco: Never  Vaping Use   Vaping Use: Never used  Substance and Sexual Activity   Alcohol use: Not Currently   Drug use: Never   Sexual activity: Not on file  Other Topics Concern    Not on file  Social History Narrative   Not on file   Social Determinants of Health   Financial Resource Strain: Not on file  Food Insecurity: Not on file  Transportation Needs: Not on file  Physical Activity: Not on file  Stress: Not on file  Social Connections: Not on file    Allergies:  Allergies  Allergen Reactions   Compazine [Prochlorperazine]    Reglan [Metoclopramide]    Zofran [Ondansetron]     Metabolic Disorder Labs: No results found for: HGBA1C, MPG No results found for: PROLACTIN No results found for: CHOL, TRIG, HDL, CHOLHDL, VLDL, LDLCALC No results found for: TSH  Therapeutic Level Labs: No results found for: LITHIUM No results found for: VALPROATE No components found for:  CBMZ  Current Medications: Current Outpatient Medications  Medication Sig Dispense Refill   ARIPiprazole (ABILIFY) 30 MG tablet Take 1 tablet (30 mg total) by mouth daily. 30 tablet 1   escitalopram (LEXAPRO) 20 MG tablet Take 2 tablets (40 mg total) by mouth daily. 60 tablet 1   gabapentin (NEURONTIN) 100 MG capsule Take 1 capsule (100 mg total) by mouth 3 (three) times daily. 90 capsule 0   hydrOXYzine (ATARAX/VISTARIL) 25 MG tablet Take 1 tablet (25 mg total) by mouth 3 (three) times daily as needed for anxiety. 60 tablet 0   insulin aspart (NOVOLOG) 100 UNIT/ML injection Inject into the skin 3 (three) times daily with meals.     insulin detemir (LEVEMIR) 100 UNIT/ML injection Inject 100 Units into the skin daily.     lamoTRIgine (LAMICTAL) 25 MG tablet Take 1 tablet (25 mg total) by mouth daily. 14 tablet 0   lisinopril (PRINIVIL,ZESTRIL) 5 MG tablet Take 5 mg by mouth daily.     metFORMIN (GLUCOPHAGE) 1000 MG tablet Take 1,000 mg by mouth 2 (two) times daily with a meal.     naproxen (NAPROSYN) 500 MG tablet Take 1 tablet (500 mg total) by mouth 2 (two) times daily. 30 tablet 0   nitroGLYCERIN (NITROSTAT) 0.4 MG SL tablet Place 1 tablet (0.4 mg total) under the tongue every 5  (five) minutes as needed for chest pain. Up to 2 doses 30 tablet 0   traZODone (DESYREL) 50 MG tablet Take 1 tablet (50 mg total) by mouth at bedtime as needed for sleep. 30 tablet 1   [START ON 03/01/2021] clonazePAM (KLONOPIN) 1 MG tablet Take 1 tablet (1 mg total) by mouth 2 (two) times daily as needed for anxiety. 60 tablet 0   fenofibrate (TRICOR) 48 MG tablet Take 1 tablet (48 mg total) by mouth daily. 30 tablet 2   glimepiride (AMARYL) 2 MG tablet Take 1 tablet (2 mg total) by mouth daily with breakfast. 30 tablet 1   rosuvastatin (CRESTOR) 20 MG tablet Take 1 tablet (20 mg total) by mouth daily for 30 doses. 30 tablet 2   No current facility-administered medications  for this visit.     Musculoskeletal: Strength & Muscle Tone: within normal limits Gait & Station: normal Patient leans: N/A  Psychiatric Specialty Exam: Review of Systems  Psychiatric/Behavioral:  Positive for sleep disturbance. Negative for decreased concentration, dysphoric mood, hallucinations, self-injury and suicidal ideas. The patient is nervous/anxious. The patient is not hyperactive.    Blood pressure 136/78, pulse 74, height 5\' 5"  (1.651 m), weight 211 lb (95.7 kg).Body mass index is 35.11 kg/m.  General Appearance: Well Groomed  Eye Contact:  Good  Speech:  Clear and Coherent and Normal Rate  Volume:  Normal  Mood:  Anxious and Euthymic  Affect:  Appropriate and Congruent  Thought Process:  Coherent, Goal Directed, and Descriptions of Associations: Intact  Orientation:  Full (Time, Place, and Person)  Thought Content: WDL   Suicidal Thoughts:  No  Homicidal Thoughts:  No  Memory:  Immediate;   Good Recent;   Good Remote;   Good  Judgement:  Good  Insight:  Good  Psychomotor Activity:  Normal  Concentration:  Concentration: Good and Attention Span: Good  Recall:  Good  Fund of Knowledge: Good  Language: Good  Akathisia:  NA  Handed:  Right  AIMS (if indicated): not done  Assets:  Desire for  Improvement Financial Resources/Insurance Housing Social Support Transportation Vocational/Educational  ADL's:  Intact  Cognition: WNL  Sleep:  Fair   Screenings: GAD-7    Flowsheet Row Clinical Support from 02/12/2021 in [facility] Clinical Support from 01/30/2021 in [facility] Office Visit from 12/25/2020 in [facility]  Total GAD-7 Score 20 19 15       PHQ2-9    Flowsheet Row Clinical Support from 02/12/2021 in [facility] Clinical Support from 01/30/2021 in [facility] Office Visit from 12/25/2020 in [facility]  PHQ-2 Total Score 0 0 3  PHQ-9 Total Score -- -- 13      Flowsheet Row Clinical Support from 02/12/2021 in [facility] Most recent reading at 02/12/2021  8:09 AM Clinical Support from 01/30/2021 in [facility] Most recent reading at 01/30/2021  9:09 AM ED from 01/30/2021 in [facility] EMERGENCY DEPARTMENT Most recent reading at 01/30/2021  4:04 AM  C-SSRS RISK CATEGORY No Risk No Risk No Risk        Assessment and Plan:   Danielle Barker is a 45 year old female with a past psychiatric history significant for insomnia, generalized anxiety disorder with panic attacks, and bipolar 2 disorder who presents to [facility] for follow-up and medication management.  Patient continues to endorse elevated anxiety that keeps her from being able to work.  She also notes a number of stressors in her life that could be contributing to her anxiety.  She reports that her clonazepam at 0.5 mg has not been helpful.  Provider to adjust clonazepam dosage from 0.5 to 1 mg 2 times daily as needed for the management of her anxiety.  Patient also be placed on trazodone 50 mg at bedtime for the management of her  sleep.  Patient is agreeable to recommendations.  Patient's medications to be e-prescribed to pharmacy of choice.  1. Insomnia, unspecified type  - traZODone (DESYREL) 50 MG tablet; Take 1 tablet (50 mg total) by mouth at bedtime as needed for sleep.  Dispense: 30 tablet; Refill: 1  2. Generalized anxiety disorder with  panic attacks Patient to continue taking Lexapro 40 mg daily for the management of her generalized anxiety disorder with panic attacks  - clonazePAM (KLONOPIN) 1 MG tablet; Take 1 tablet (1 mg total) by mouth 2 (two) times daily as needed for anxiety.  Dispense: 60 tablet; Refill: 0  3. Bipolar 2 disorder (HCC) Patient to continue taking Abilify 30 mg daily for the management of her bipolar 2 disorder Patient to continue taking Lexapro 40 mg daily for the management of her bipolar 2 disorder  Patient to follow up in 6 weeks Provider spent a total of 17 minutes with the patient/reviewing patient's chart  Meta Hatchet, PA 02/12/2021, 8:29 AM

## 2021-02-13 ENCOUNTER — Other Ambulatory Visit: Payer: Self-pay

## 2021-02-15 ENCOUNTER — Encounter (HOSPITAL_COMMUNITY): Payer: Self-pay | Admitting: Emergency Medicine

## 2021-02-15 ENCOUNTER — Emergency Department (HOSPITAL_COMMUNITY)
Admission: EM | Admit: 2021-02-15 | Discharge: 2021-02-15 | Disposition: A | Discharge status: Home / Self Care | Payer: Self-pay | Attending: Emergency Medicine | Admitting: Emergency Medicine

## 2021-02-15 DIAGNOSIS — Z794 Long term (current) use of insulin: Secondary | ICD-10-CM | POA: Insufficient documentation

## 2021-02-15 DIAGNOSIS — R739 Hyperglycemia, unspecified: Secondary | ICD-10-CM

## 2021-02-15 DIAGNOSIS — Z7984 Long term (current) use of oral hypoglycemic drugs: Secondary | ICD-10-CM | POA: Insufficient documentation

## 2021-02-15 DIAGNOSIS — N9489 Other specified conditions associated with female genital organs and menstrual cycle: Secondary | ICD-10-CM | POA: Insufficient documentation

## 2021-02-15 DIAGNOSIS — E1165 Type 2 diabetes mellitus with hyperglycemia: Secondary | ICD-10-CM | POA: Insufficient documentation

## 2021-02-15 LAB — URINALYSIS, ROUTINE W REFLEX MICROSCOPIC
Bacteria, UA: NONE SEEN
Bilirubin Urine: NEGATIVE
Glucose, UA: >=500 mg/dL — AB
Hgb urine dipstick: NEGATIVE
Ketones, ur: NEGATIVE mg/dL
Leukocytes,Ua: NEGATIVE
Nitrite: NEGATIVE
Protein, ur: NEGATIVE mg/dL
Specific Gravity, Urine: 1.033 — ABNORMAL HIGH (ref 1.005–1.030)
pH: 5 (ref 5.0–8.0)

## 2021-02-15 LAB — BASIC METABOLIC PANEL
Anion gap: 3 — ABNORMAL LOW (ref 5–15)
BUN: 15 mg/dL (ref 6–20)
CO2: 25 mmol/L (ref 22–32)
Calcium: 8.4 mg/dL — ABNORMAL LOW (ref 8.9–10.3)
Chloride: 104 mmol/L (ref 98–111)
Creatinine, Ser: 0.89 mg/dL (ref 0.44–1.00)
GFR, Estimated: >60 mL/min (ref >60)
Glucose, Bld: 360 mg/dL — ABNORMAL HIGH (ref 70–99)
Potassium: 4 mmol/L (ref 3.5–5.1)
Sodium: 132 mmol/L — ABNORMAL LOW (ref 135–145)

## 2021-02-15 LAB — CBC WITH DIFFERENTIAL/PLATELET
Abs Immature Granulocytes: 0.03 10*3/uL (ref 0.00–0.07)
Basophils Absolute: 0 10*3/uL (ref 0.0–0.1)
Basophils Relative: 1 %
Eosinophils Absolute: 0.1 10*3/uL (ref 0.0–0.5)
Eosinophils Relative: 2 %
HCT: 37.4 % (ref 36.0–46.0)
Hemoglobin: 12.3 g/dL (ref 12.0–15.0)
Immature Granulocytes: 0 %
Lymphocytes Relative: 29 %
Lymphs Abs: 2.1 10*3/uL (ref 0.7–4.0)
MCH: 30.7 pg (ref 26.0–34.0)
MCHC: 32.9 g/dL (ref 30.0–36.0)
MCV: 93.3 fL (ref 80.0–100.0)
Monocytes Absolute: 0.5 10*3/uL (ref 0.1–1.0)
Monocytes Relative: 6 %
Neutro Abs: 4.4 10*3/uL (ref 1.7–7.7)
Neutrophils Relative %: 62 %
Platelets: 338 10*3/uL (ref 150–400)
RBC: 4.01 MIL/uL (ref 3.87–5.11)
RDW: 12.5 % (ref 11.5–15.5)
WBC: 7.2 10*3/uL (ref 4.0–10.5)
nRBC: 0 % (ref 0.0–0.2)

## 2021-02-15 LAB — I-STAT BETA HCG BLOOD, ED (MC, WL, AP ONLY): I-stat hCG, quantitative: <5 m[IU]/mL (ref <5)

## 2021-02-15 LAB — CBG MONITORING, ED
Glucose-Capillary: 413 mg/dL — ABNORMAL HIGH (ref 70–99)
Glucose-Capillary: 203 mg/dL — ABNORMAL HIGH (ref 70–99)

## 2021-02-15 MED ORDER — METFORMIN HCL 1000 MG PO TABS: 500.0000 mg | ORAL_TABLET | Freq: Two times a day (BID) | ORAL | 1 refills | Status: DC

## 2021-02-15 MED ORDER — SODIUM CHLORIDE 0.9 % IV BOLUS
1000.0000 mL | Freq: Once | INTRAVENOUS | Status: AC
Start: 2021-02-15 — End: 2021-02-15
  Administered 2021-02-15: 1000 mL via INTRAVENOUS

## 2021-02-15 NOTE — ED Triage Notes (Signed)
Pt reports high blood sugar, fatigue, HA, frequent urination, and increased thirst since earlier tonight. Hx of T2DM. Denies n/v, abdominal pain.

## 2021-02-15 NOTE — ED Provider Notes (Signed)
[facility]-EMERGENCY DEPT Provider Note   CSN: [id number] Arrival date & time: 02/15/21  0119     History Chief Complaint  Patient presents with   Hyperglycemia    Danielle Barker is a 45 y.o. female.  Patient to ED concerned for a high blood sugar reading at home of 500. She endorses being very thirsty and increased urination without dysuria or fever. She has [place] and has not had a doctor locally to manage her diabetes. She has been getting her insulin from a pharmacy without a prescription and is changing her dosing in an effort to control her blood sugar. She also states she is out of her Metformin and Ozempic. No nausea, vomiting, fever, chest pain or cough.   The history is provided by the patient. No language interpreter was used.  Hyperglycemia Associated symptoms: increased thirst and polyuria   Associated symptoms: no dysuria, no fever, no nausea and no vomiting       Past Medical History:  Diagnosis Date   Diabetes mellitus without complication (HCC)    High cholesterol    High triglycerides    Mood disorder [facility])     Patient Active Problem List   Diagnosis Date Noted   Insomnia 02/12/2021   Bipolar 2 disorder (HCC) 12/25/2020   Generalized anxiety disorder with panic attacks 12/25/2020    Past Surgical History:  Procedure Laterality Date   APPENDECTOMY     CHOLECYSTECTOMY     SHOULDER SURGERY Left    TONSILLECTOMY       OB History   No obstetric history on file.     Family History  Problem Relation Age of Onset   Cancer Mother    Heart failure Mother    Cancer Father     Social History   Tobacco Use   Smoking status: Never   Smokeless tobacco: Never  Vaping Use   Vaping Use: Never used  Substance Use Topics   Alcohol use: Not Currently   Drug use: Never    Home Medications Prior to Admission medications   Medication Sig Start Date End Date Taking? Authorizing Provider  ARIPiprazole  (ABILIFY) 30 MG tablet Take 1 tablet (30 mg total) by mouth daily. 01/30/21   Nwoko, Tommas Olp, PA  clonazePAM (KLONOPIN) 1 MG tablet Take 1 tablet (1 mg total) by mouth 2 (two) times daily as needed for anxiety. 03/01/21 03/01/22  Nwoko, Tommas Olp, PA  escitalopram (LEXAPRO) 20 MG tablet Take 2 tablets (40 mg total) by mouth daily. 01/30/21   Nwoko, Tommas Olp, PA  fenofibrate (TRICOR) 48 MG tablet Take 1 tablet (48 mg total) by mouth daily. 09/12/20 10/12/20  Terald Sleeper, MD  gabapentin (NEURONTIN) 100 MG capsule Take 1 capsule (100 mg total) by mouth 3 (three) times daily. 11/25/20   Ajibola, Gerrianne Scale A, NP  glimepiride (AMARYL) 2 MG tablet Take 1 tablet (2 mg total) by mouth daily with breakfast. 01/06/21 02/05/21  Caccavale, Sophia, PA-C  hydrOXYzine (ATARAX/VISTARIL) 25 MG tablet Take 1 tablet (25 mg total) by mouth 3 (three) times daily as needed for anxiety. 11/25/20   Ajibola, Ene A, NP  insulin aspart (NOVOLOG) 100 UNIT/ML injection Inject into the skin 3 (three) times daily with meals.    [provider]  insulin detemir (LEVEMIR) 100 UNIT/ML injection Inject 100 Units into the skin daily.    [provider]  lamoTRIgine (LAMICTAL) 25 MG tablet Take 1 tablet (25 mg total) by mouth  daily. 12/25/20 12/25/21  Karel Jarvis E, PA  lisinopril (PRINIVIL,ZESTRIL) 5 MG tablet Take 5 mg by mouth daily.    [provider]  metFORMIN (GLUCOPHAGE) 1000 MG tablet Take 1,000 mg by mouth 2 (two) times daily with a meal.    [provider]  naproxen (NAPROSYN) 500 MG tablet Take 1 tablet (500 mg total) by mouth 2 (two) times daily. 09/18/20   Sabas Sous, MD  nitroGLYCERIN (NITROSTAT) 0.4 MG SL tablet Place 1 tablet (0.4 mg total) under the tongue every 5 (five) minutes as needed for chest pain. Up to 2 doses 09/12/20   Terald Sleeper, MD  rosuvastatin (CRESTOR) 20 MG tablet Take 1 tablet (20 mg total) by mouth daily for 30 doses. 09/12/20 10/12/20  Terald Sleeper, MD   traZODone (DESYREL) 50 MG tablet Take 1 tablet (50 mg total) by mouth at bedtime as needed for sleep. 02/12/21   Nwoko, Tommas Olp, PA  citalopram (CELEXA) 40 MG tablet Take 40 mg by mouth daily.  11/25/20  [provider]    Allergies    Compazine [prochlorperazine], Reglan [metoclopramide], and Zofran [ondansetron]  Review of Systems   Review of Systems  Constitutional:  Negative for chills and fever.  HENT: Negative.    Respiratory: Negative.    Cardiovascular: Negative.   Gastrointestinal: Negative.  Negative for nausea and vomiting.  Endocrine: Positive for polydipsia and polyuria.  Genitourinary:  Negative for dysuria.  Musculoskeletal: Negative.   Skin: Negative.   Neurological: Negative.    Physical Exam Updated Vital Signs BP 133/86 (BP Location: Right Arm)   Pulse 79   Temp 98 F (36.7 C) (Oral)   Resp 15   Ht 5\' 6"  (1.676 m)   Wt 93.9 kg   SpO2 100%   BMI 33.41 kg/m   Physical Exam Constitutional:      Appearance: She is well-developed.  HENT:     Head: Normocephalic.  Cardiovascular:     Rate and Rhythm: Normal rate and regular rhythm.     Heart sounds: No murmur heard. Pulmonary:     Effort: Pulmonary effort is normal.     Breath sounds: Normal breath sounds. No wheezing, rhonchi or rales.  Abdominal:     General: Bowel sounds are normal.     Palpations: Abdomen is soft.     Tenderness: There is no abdominal tenderness. There is no guarding or rebound.  Musculoskeletal:        General: Normal range of motion.     Cervical back: Normal range of motion and neck supple.  Skin:    General: Skin is warm and dry.  Neurological:     General: No focal deficit present.     Mental Status: She is alert and oriented to person, place, and time.    ED Results / Procedures / Treatments   Labs (all labs ordered are listed, but only abnormal results are displayed) Labs Reviewed  BASIC METABOLIC PANEL - Abnormal; Notable for the following components:       Result Value   Sodium 132 (*)    Glucose, Bld 360 (*)    Calcium 8.4 (*)    Anion gap 3 (*)    All other components within normal limits  CBG MONITORING, ED - Abnormal; Notable for the following components:   Glucose-Capillary 413 (*)    All other components within normal limits  CBC WITH DIFFERENTIAL/PLATELET  URINALYSIS, ROUTINE W REFLEX MICROSCOPIC  CBG MONITORING, ED  I-STAT BETA  HCG BLOOD, ED ([facility], [facility], [facility] ONLY)    EKG None  Radiology No results found.  Procedures Procedures   Medications Ordered in ED Medications  sodium chloride 0.9 % bolus 1,000 mL (1,000 mLs Intravenous New Bag/Given 02/15/21 0232)    ED Course  I have reviewed the triage vital signs and the nursing notes.  Pertinent labs & imaging results that were available during my care of the patient were reviewed by me and considered in my medical decision making (see chart for details).    MDM Rules/Calculators/A&P                           Patient to ED with high blood sugar tonight, out of some of her medications, transitioning into the area pending her first PCP appointment on the 20th.   No evidence DKA. CBG 413 on arrival improved to 203 with IV fluids only. She is feeling improved.   Rx Metformin provided at her stated dose (500 mg BID). TOC order placed for medication assistance. She decline Rx Ozempic secondary to cost.   Final Clinical Impression(s) / ED Diagnoses Final diagnoses:  None   Hyperglycemia   Rx / DC Orders ED Discharge Orders     None        Elpidio Anis, PA-C 02/15/21 0545    Gilda Crease, MD 02/15/21 0730

## 2021-02-15 NOTE — Discharge Instructions (Signed)
Keep your upcoming appointment with your new doctor for further management of your diabetes.   Return to the ED with any new or concerning symptoms at any time.

## 2021-02-16 ENCOUNTER — Encounter (HOSPITAL_COMMUNITY): Payer: Self-pay

## 2021-02-16 ENCOUNTER — Emergency Department (HOSPITAL_COMMUNITY): Payer: Self-pay

## 2021-02-16 ENCOUNTER — Other Ambulatory Visit: Payer: Self-pay

## 2021-02-16 ENCOUNTER — Emergency Department (HOSPITAL_COMMUNITY)
Admission: EM | Admit: 2021-02-16 | Discharge: 2021-02-17 | Disposition: A | Discharge status: Home / Self Care | Payer: Self-pay | Attending: Emergency Medicine | Admitting: Emergency Medicine

## 2021-02-16 DIAGNOSIS — R739 Hyperglycemia, unspecified: Secondary | ICD-10-CM

## 2021-02-16 DIAGNOSIS — R600 Localized edema: Secondary | ICD-10-CM | POA: Insufficient documentation

## 2021-02-16 DIAGNOSIS — Z794 Long term (current) use of insulin: Secondary | ICD-10-CM | POA: Insufficient documentation

## 2021-02-16 DIAGNOSIS — R059 Cough, unspecified: Secondary | ICD-10-CM | POA: Insufficient documentation

## 2021-02-16 DIAGNOSIS — R002 Palpitations: Secondary | ICD-10-CM | POA: Insufficient documentation

## 2021-02-16 DIAGNOSIS — R0789 Other chest pain: Secondary | ICD-10-CM | POA: Insufficient documentation

## 2021-02-16 DIAGNOSIS — N9489 Other specified conditions associated with female genital organs and menstrual cycle: Secondary | ICD-10-CM | POA: Insufficient documentation

## 2021-02-16 DIAGNOSIS — Z20822 Contact with and (suspected) exposure to covid-19: Secondary | ICD-10-CM | POA: Insufficient documentation

## 2021-02-16 DIAGNOSIS — Z7984 Long term (current) use of oral hypoglycemic drugs: Secondary | ICD-10-CM | POA: Insufficient documentation

## 2021-02-16 DIAGNOSIS — E1165 Type 2 diabetes mellitus with hyperglycemia: Secondary | ICD-10-CM | POA: Insufficient documentation

## 2021-02-16 LAB — CBC
HCT: 38.9 % (ref 36.0–46.0)
Hemoglobin: 12.7 g/dL (ref 12.0–15.0)
MCH: 30.5 pg (ref 26.0–34.0)
MCHC: 32.6 g/dL (ref 30.0–36.0)
MCV: 93.3 fL (ref 80.0–100.0)
Platelets: 346 10*3/uL (ref 150–400)
RBC: 4.17 MIL/uL (ref 3.87–5.11)
RDW: 12.4 % (ref 11.5–15.5)
WBC: 6.9 10*3/uL (ref 4.0–10.5)
nRBC: 0 % (ref 0.0–0.2)

## 2021-02-16 LAB — I-STAT BETA HCG BLOOD, ED (MC, WL, AP ONLY): I-stat hCG, quantitative: <5 m[IU]/mL (ref <5)

## 2021-02-16 LAB — CBG MONITORING, ED: Glucose-Capillary: 311 mg/dL — ABNORMAL HIGH (ref 70–99)

## 2021-02-16 NOTE — ED Provider Notes (Signed)
Emergency Medicine Provider Triage Evaluation Note  Danielle Barker , a 45 y.o. female  was evaluated in triage.  Pt complains of chest pain/pressure and shortness of breath.  Her symptoms started about 45 minutes prior to arrival.  She reports pressure in her chest that radiates into her jaw.  She feels like her legs and arms are swollen and states that she has gained about 15 pounds in the past 2 weeks.  She denies any fevers.  She states her mom has been sick and her mom had a negative COVID test but they live together and patient thinks her mom may actually have COVID.  Review of Systems  Positive: Headache, chest pain, shortness of breath, cough Negative: Syncope  Physical Exam  BP (!) 150/82 (BP Location: Left Arm)   Pulse 90   Temp 98.2 F (36.8 C) (Oral)   Resp 20   Ht 5\' 6"  (1.676 m)   Wt 95.3 kg   LMP 02/05/2021   SpO2 96%   BMI 33.89 kg/m  Gen:   Awake, no distress   Resp:  Normal effort  MSK:   Moves extremities without difficulty  Other:  Lungs clear to auscultation bilaterally.  Regular rate and rhythm.  Medical Decision Making  Medically screening exam initiated at 11:27 PM.  Appropriate orders placed.  Evolet Salminen Kenney was informed that the remainder of the evaluation will be completed by another provider, this initial triage assessment does not replace that evaluation, and the importance of remaining in the ED until their evaluation is complete.  Will order cardiac labs and CXR/covid/flu testing   Carmelina Dane 02/16/21 2329    Molpus, 02/18/21, MD 02/17/21 [phone]

## 2021-02-16 NOTE — ED Triage Notes (Signed)
Pt reports sternal chest pains radiating to her back beginning just prior to arrival. Also sts elevated cbg, headache and cough.

## 2021-02-17 ENCOUNTER — Other Ambulatory Visit: Payer: Self-pay

## 2021-02-17 LAB — TROPONIN I (HIGH SENSITIVITY)
Troponin I (High Sensitivity): <2 ng/L (ref <18)
Troponin I (High Sensitivity): <2 ng/L (ref <18)

## 2021-02-17 LAB — RESP PANEL BY RT-PCR (FLU A&B, COVID) ARPGX2
Influenza A by PCR: NEGATIVE
Influenza B by PCR: NEGATIVE
SARS Coronavirus 2 by RT PCR: NEGATIVE

## 2021-02-17 LAB — BRAIN NATRIURETIC PEPTIDE: B Natriuretic Peptide: 22.7 pg/mL (ref 0.0–100.0)

## 2021-02-17 LAB — COMPREHENSIVE METABOLIC PANEL
ALT: 21 U/L (ref 0–44)
AST: 18 U/L (ref 15–41)
Albumin: 3.7 g/dL (ref 3.5–5.0)
Alkaline Phosphatase: 96 U/L (ref 38–126)
Anion gap: 8 (ref 5–15)
BUN: 19 mg/dL (ref 6–20)
CO2: 22 mmol/L (ref 22–32)
Calcium: 9.1 mg/dL (ref 8.9–10.3)
Chloride: 102 mmol/L (ref 98–111)
Creatinine, Ser: 0.78 mg/dL (ref 0.44–1.00)
GFR, Estimated: >60 mL/min (ref >60)
Glucose, Bld: 318 mg/dL — ABNORMAL HIGH (ref 70–99)
Potassium: 4.1 mmol/L (ref 3.5–5.1)
Sodium: 132 mmol/L — ABNORMAL LOW (ref 135–145)
Total Bilirubin: 0.3 mg/dL (ref 0.3–1.2)
Total Protein: 6.7 g/dL (ref 6.5–8.1)

## 2021-02-17 LAB — LIPASE, BLOOD: Lipase: 32 U/L (ref 11–51)

## 2021-02-17 MED ORDER — HYDROCHLOROTHIAZIDE 12.5 MG PO TABS
12.5000 mg | ORAL_TABLET | Freq: Every day | ORAL | 0 refills | Status: DC
  Filled 2021-02-17 (×2): qty 5, 5d supply, fill #0

## 2021-02-17 MED ORDER — HYDROCHLOROTHIAZIDE 12.5 MG PO TABS: 12.5000 mg | ORAL_TABLET | Freq: Every day | ORAL | 0 refills | Status: DC

## 2021-02-17 NOTE — ED Notes (Signed)
Pt ambulatory from triage with a steady gait. Pt placed on BP, ECG and pulse ox monitoring. Pt is A&Ox4.

## 2021-02-17 NOTE — Discharge Instructions (Signed)
Take HCTZ daily as prescribed. Follow-up with primary care, given referral for [place] and wellness. Return to ER for worsening or concerning symptoms

## 2021-02-17 NOTE — ED Provider Notes (Signed)
[facility]-EMERGENCY DEPT Provider Note   CSN: [id number] Arrival date & time: 02/16/21  2256     History Chief Complaint  Patient presents with   Chest Pain    Danielle Barker is a 45 y.o. female.  45 year old female presents with complaint of feeling like her heart was racing tonight, brief and lasted few seconds, resolved and has not returned.  States that in the past week she has gained 15 pounds, her blood sugar is running high and she has left-sided chest pain described as aching, not worse with exertion.  Also mild nonproductive cough.  Denies body aches, fever, chills, or shortness of breath.       Past Medical History:  Diagnosis Date   Diabetes mellitus without complication (HCC)    High cholesterol    High triglycerides    Mood disorder [facility])     Patient Active Problem List   Diagnosis Date Noted   Insomnia 02/12/2021   Bipolar 2 disorder (HCC) 12/25/2020   Generalized anxiety disorder with panic attacks 12/25/2020    Past Surgical History:  Procedure Laterality Date   APPENDECTOMY     CHOLECYSTECTOMY     SHOULDER SURGERY Left    TONSILLECTOMY       OB History   No obstetric history on file.     Family History  Problem Relation Age of Onset   Cancer Mother    Heart failure Mother    Cancer Father     Social History   Tobacco Use   Smoking status: Never   Smokeless tobacco: Never  Vaping Use   Vaping Use: Never used  Substance Use Topics   Alcohol use: Not Currently   Drug use: Never    Home Medications Prior to Admission medications   Medication Sig Start Date End Date Taking? Authorizing Provider  ARIPiprazole (ABILIFY) 30 MG tablet Take 1 tablet (30 mg total) by mouth daily. 01/30/21   Nwoko, Tommas Olp, PA  clonazePAM (KLONOPIN) 1 MG tablet Take 1 tablet (1 mg total) by mouth 2 (two) times daily as needed for anxiety. 03/01/21 03/01/22  Nwoko, Tommas Olp, PA  escitalopram (LEXAPRO) 20 MG tablet Take 2 tablets  (40 mg total) by mouth daily. 01/30/21   Nwoko, Tommas Olp, PA  fenofibrate (TRICOR) 48 MG tablet Take 1 tablet (48 mg total) by mouth daily. 09/12/20 10/12/20  Terald Sleeper, MD  gabapentin (NEURONTIN) 100 MG capsule Take 1 capsule (100 mg total) by mouth 3 (three) times daily. 11/25/20   Ajibola, Gerrianne Scale A, NP  glimepiride (AMARYL) 2 MG tablet Take 1 tablet (2 mg total) by mouth daily with breakfast. 01/06/21 02/05/21  Caccavale, Sophia, PA-C  hydrochlorothiazide (HYDRODIURIL) 12.5 MG tablet Take 1 tablet (12.5 mg total) by mouth daily for 5 days. 02/17/21 02/22/21  Jeannie Fend, PA-C  hydrOXYzine (ATARAX/VISTARIL) 25 MG tablet Take 1 tablet (25 mg total) by mouth 3 (three) times daily as needed for anxiety. 11/25/20   Ajibola, Ene A, NP  insulin aspart (NOVOLOG) 100 UNIT/ML injection Inject into the skin 3 (three) times daily with meals.    [provider]  insulin detemir (LEVEMIR) 100 UNIT/ML injection Inject 100 Units into the skin daily.    [provider]  lamoTRIgine (LAMICTAL) 25 MG tablet Take 1 tablet (25 mg total) by mouth daily. 12/25/20 12/25/21  Nwoko, Tommas Olp, PA  lisinopril (PRINIVIL,ZESTRIL) 5 MG tablet Take 5 mg by mouth daily.    [provider]  metFORMIN (  GLUCOPHAGE) 1000 MG tablet Take 0.5 tablets (500 mg total) by mouth 2 (two) times daily with a meal. 02/15/21   Upstill, Melvenia Beam, PA-C  naproxen (NAPROSYN) 500 MG tablet Take 1 tablet (500 mg total) by mouth 2 (two) times daily. 09/18/20   Sabas Sous, MD  nitroGLYCERIN (NITROSTAT) 0.4 MG SL tablet Place 1 tablet (0.4 mg total) under the tongue every 5 (five) minutes as needed for chest pain. Up to 2 doses 09/12/20   Terald Sleeper, MD  rosuvastatin (CRESTOR) 20 MG tablet Take 1 tablet (20 mg total) by mouth daily for 30 doses. 09/12/20 10/12/20  Terald Sleeper, MD  traZODone (DESYREL) 50 MG tablet Take 1 tablet (50 mg total) by mouth at bedtime as needed for sleep. 02/12/21   Nwoko, Tommas Olp, PA   citalopram (CELEXA) 40 MG tablet Take 40 mg by mouth daily.  11/25/20  [provider]    Allergies    Compazine [prochlorperazine], Reglan [metoclopramide], and Zofran [ondansetron]  Review of Systems   Review of Systems  Constitutional:  Negative for chills and fever.  HENT:  Negative for congestion and sore throat.   Respiratory:  Positive for cough. Negative for shortness of breath.   Cardiovascular:  Positive for chest pain, palpitations and leg swelling.  Gastrointestinal:  Negative for abdominal pain, constipation, diarrhea, nausea and vomiting.  Endocrine: Positive for polydipsia.  Genitourinary:  Negative for dysuria.  Musculoskeletal:  Negative for arthralgias and myalgias.  Skin:  Negative for rash and wound.  Allergic/Immunologic: Positive for immunocompromised state.  Neurological:  Negative for weakness.  Hematological:  Negative for adenopathy.  Psychiatric/Behavioral:  Negative for confusion.   All other systems reviewed and are negative.  Physical Exam Updated Vital Signs BP 124/80   Pulse 80   Temp 98.2 F (36.8 C) (Oral)   Resp 16   Ht 5\' 6"  (1.676 m)   Wt 95.3 kg   LMP 02/05/2021   SpO2 100%   BMI 33.89 kg/m   Physical Exam Vitals and nursing note reviewed.  Constitutional:      General: She is not in acute distress.    Appearance: She is well-developed. She is not diaphoretic.  HENT:     Head: Normocephalic and atraumatic.  Cardiovascular:     Rate and Rhythm: Normal rate and regular rhythm.     Heart sounds: Normal heart sounds.  Pulmonary:     Effort: Pulmonary effort is normal.     Breath sounds: Normal breath sounds.  Chest:     Chest wall: No tenderness.  Abdominal:     Palpations: Abdomen is soft.     Tenderness: There is no abdominal tenderness.  Musculoskeletal:     Cervical back: Neck supple.     Right lower leg: Edema present.     Left lower leg: Edema present.     Comments: Mild pitting edema bilateral lower  extremities  Skin:    General: Skin is warm and dry.     Findings: No erythema.     Nails: There is no clubbing.  Neurological:     Mental Status: She is alert and oriented to person, place, and time.  Psychiatric:        Behavior: Behavior normal.    ED Results / Procedures / Treatments   Labs (all labs ordered are listed, but only abnormal results are displayed) Labs Reviewed  COMPREHENSIVE METABOLIC PANEL - Abnormal; Notable for the following components:      Result Value  Sodium 132 (*)    Glucose, Bld 318 (*)    All other components within normal limits  CBG MONITORING, ED - Abnormal; Notable for the following components:   Glucose-Capillary 311 (*)    All other components within normal limits  RESP PANEL BY RT-PCR (FLU A&B, COVID) ARPGX2  CBC  LIPASE, BLOOD  BRAIN NATRIURETIC PEPTIDE  I-STAT BETA HCG BLOOD, ED ([facility], [facility], [facility] ONLY)  TROPONIN I (HIGH SENSITIVITY)  TROPONIN I (HIGH SENSITIVITY)    EKG EKG Interpretation  Date/Time:  Sunday February 16 2021 23:10:30 EDT Ventricular Rate:  92 PR Interval:  154 QRS Duration: 87 QT Interval:  354 QTC Calculation: 438 R Axis:   77 Text Interpretation: Sinus rhythm Normal ECG No previous ECGs available Confirmed by Molpus, John ([id number]) on 02/16/2021 11:53:32 PM  Radiology DG Chest 2 View  Result Date: 02/16/2021 CLINICAL DATA:  Chest pain.  Shortness of breath. EXAM: CHEST - 2 VIEW COMPARISON:  09/12/2020 FINDINGS: The cardiomediastinal contours are normal. The lungs are clear. Pulmonary vasculature is normal. No consolidation, pleural effusion, or pneumothorax. No acute osseous abnormalities are seen. IMPRESSION: Negative radiographs of the chest. Electronically Signed   By: Melanie  Sanford M.D.   On: 02/16/2021 23:23    Procedures Procedures   Medications Ordered in ED Medications - No data to display  ED Course  I have reviewed the triage vital signs and the nursing notes.  Pertinent labs & imaging results  that were available during my care of the patient were reviewed by me and considered in my medical decision making (see chart for details).  Clinical Course as of 02/17/21 0418  Mon Feb 17, 2021  0416 45 year old female presents with multiple complaints as above.  Does have mild pitting edema to bilateral lower extremities.  Lungs clear to auscultation, chest x-ray unremarkable.  BNP within normal limits, doubt CHF.  Troponin x2 negative.  Lipase within normal notes, COVID and flu negative.  Glucose is elevated at 318 on her chemistry, CBC within normal limits.  Not in DKA.  EKG without ischemic changes.  Offered IV fluids to help with her hyperglycemia, patient declines and states she will drink fluids at home.  Plan is for 5 days of HCTZ for her edema, referred to [place] [facility] for primary care follow-up. [LM]    Clinical Course User Index [LM] Donnamaria Shands A, PA-C   MDM Rules/Calculators/A&P                           Final Clinical Impression(s) / ED Diagnoses Final diagnoses:  Edema, lower extremity  Palpitations  Hyperglycemia    Rx / DC Orders ED Discharge Orders          Ordered    hydrochlorothiazide (HYDRODIURIL) 12.5 MG tablet  Daily,   Status:  Discontinued        02/17/21 0235    hydrochlorothiazide (HYDRODIURIL) 12.5 MG tablet  Daily        10 /24/22 0235             12-05-2002, PA-C 02/17/21 0418    Molpus, John, MD 02/17/21 [phone]

## 2021-02-24 ENCOUNTER — Other Ambulatory Visit: Payer: Self-pay

## 2021-02-28 ENCOUNTER — Other Ambulatory Visit: Payer: Self-pay

## 2021-02-28 ENCOUNTER — Ambulatory Visit (HOSPITAL_COMMUNITY)
Admission: EM | Admit: 2021-02-28 | Discharge: 2021-02-28 | Disposition: A | Discharge status: Home / Self Care | Payer: Self-pay | Attending: Student | Admitting: Student

## 2021-02-28 ENCOUNTER — Encounter (HOSPITAL_COMMUNITY): Payer: Self-pay

## 2021-02-28 DIAGNOSIS — A084 Viral intestinal infection, unspecified: Secondary | ICD-10-CM

## 2021-02-28 MED ORDER — DICYCLOMINE HCL 20 MG PO TABS
20.0000 mg | ORAL_TABLET | Freq: Two times a day (BID) | ORAL | 0 refills | Status: DC
  Filled 2021-02-28: qty 20, 10d supply, fill #0

## 2021-02-28 NOTE — ED Provider Notes (Signed)
[facility]    CSN: [id number] Arrival date & time: 02/28/21  1325      History   Chief Complaint Chief Complaint  Patient presents with   Fever   Diarrhea    HPI Danielle Barker is a 45 y.o. female presenting with fevers, diarrhea, body aches, stomach pain.  Medical history noncontributory.  Describes 2 days of symptoms.  Here today with her mom who also has similar symptoms that have now resolved.  Patient is unable to quantify number of episodes of vomiting and diarrhea, but states that the vomiting is bilious.  Generalized crampy abdominal pain and bloating.  States that she has had both her appendix and gallbladder removed, and she cannot be pregnant.  Tolerating some fluids but no food. Of note, she did recently complete abx for BV - flagyl.  HPI  Past Medical History:  Diagnosis Date   Diabetes mellitus without complication (HCC)    High cholesterol    High triglycerides    Mood disorder [facility])     Patient Active Problem List   Diagnosis Date Noted   Insomnia 02/12/2021   Bipolar 2 disorder (HCC) 12/25/2020   Generalized anxiety disorder with panic attacks 12/25/2020    Past Surgical History:  Procedure Laterality Date   APPENDECTOMY     CHOLECYSTECTOMY     SHOULDER SURGERY Left    TONSILLECTOMY      OB History   No obstetric history on file.      Home Medications    Prior to Admission medications   Medication Sig Start Date End Date Taking? Authorizing Provider  dicyclomine (BENTYL) 20 MG tablet Take 1 tablet (20 mg total) by mouth 2 (two) times daily. 02/28/21  Yes Rhys Martini, PA-C  ARIPiprazole (ABILIFY) 30 MG tablet Take 1 tablet (30 mg total) by mouth daily. 01/30/21   Nwoko, Tommas Olp, PA  clonazePAM (KLONOPIN) 1 MG tablet Take 1 tablet (1 mg total) by mouth 2 (two) times daily as needed for anxiety. 03/01/21 03/01/22  Nwoko, Tommas Olp, PA  escitalopram (LEXAPRO) 20 MG tablet Take 2 tablets (40 mg total) by mouth daily. 01/30/21    Nwoko, Tommas Olp, PA  fenofibrate (TRICOR) 48 MG tablet Take 1 tablet (48 mg total) by mouth daily. 09/12/20 10/12/20  Terald Sleeper, MD  gabapentin (NEURONTIN) 100 MG capsule Take 1 capsule (100 mg total) by mouth 3 (three) times daily. 11/25/20   Ajibola, Gerrianne Scale A, NP  glimepiride (AMARYL) 2 MG tablet Take 1 tablet (2 mg total) by mouth daily with breakfast. 01/06/21 02/05/21  Caccavale, Sophia, PA-C  hydrochlorothiazide (HYDRODIURIL) 12.5 MG tablet Take 1 tablet (12.5 mg total) by mouth daily for 5 days. 02/17/21 02/22/21  Jeannie Fend, PA-C  hydrOXYzine (ATARAX/VISTARIL) 25 MG tablet Take 1 tablet (25 mg total) by mouth 3 (three) times daily as needed for anxiety. 11/25/20   Ajibola, Ene A, NP  insulin aspart (NOVOLOG) 100 UNIT/ML injection Inject into the skin 3 (three) times daily with meals.    [provider]  insulin detemir (LEVEMIR) 100 UNIT/ML injection Inject 100 Units into the skin daily.    [provider]  lamoTRIgine (LAMICTAL) 25 MG tablet Take 1 tablet (25 mg total) by mouth daily. 12/25/20 12/25/21  Nwoko, Tommas Olp, PA  lisinopril (PRINIVIL,ZESTRIL) 5 MG tablet Take 5 mg by mouth daily.    [provider]  metFORMIN (GLUCOPHAGE) 1000 MG tablet Take 0.5 tablets (500 mg total) by mouth 2 (two) times  daily with a meal. 02/15/21   Upstill, Melvenia Beam, PA-C  naproxen (NAPROSYN) 500 MG tablet Take 1 tablet (500 mg total) by mouth 2 (two) times daily. 09/18/20   Sabas Sous, MD  nitroGLYCERIN (NITROSTAT) 0.4 MG SL tablet Place 1 tablet (0.4 mg total) under the tongue every 5 (five) minutes as needed for chest pain. Up to 2 doses 09/12/20   Terald Sleeper, MD  rosuvastatin (CRESTOR) 20 MG tablet Take 1 tablet (20 mg total) by mouth daily for 30 doses. 09/12/20 10/12/20  Terald Sleeper, MD  traZODone (DESYREL) 50 MG tablet Take 1 tablet (50 mg total) by mouth at bedtime as needed for sleep. 02/12/21   Nwoko, Tommas Olp, PA  citalopram (CELEXA) 40 MG tablet Take 40 mg  by mouth daily.  11/25/20  [provider]    Family History Family History  Problem Relation Age of Onset   Cancer Mother    Heart failure Mother    Cancer Father     Social History Social History   Tobacco Use   Smoking status: Never   Smokeless tobacco: Never  Vaping Use   Vaping Use: Never used  Substance Use Topics   Alcohol use: Not Currently   Drug use: Never     Allergies   Compazine [prochlorperazine], Reglan [metoclopramide], and Zofran [ondansetron]   Review of Systems Review of Systems  Constitutional:  Negative for appetite change, chills, diaphoresis, fever and unexpected weight change.  HENT:  Negative for congestion, ear pain, sinus pressure, sinus pain, sneezing, sore throat and trouble swallowing.   Respiratory:  Negative for cough, chest tightness and shortness of breath.   Cardiovascular:  Negative for chest pain.  Gastrointestinal:  Positive for abdominal pain, diarrhea, nausea and vomiting. Negative for abdominal distention, anal bleeding, blood in stool, constipation and rectal pain.  Genitourinary:  Negative for dysuria, flank pain, frequency and urgency.  Musculoskeletal:  Negative for back pain and myalgias.  Neurological:  Negative for dizziness, light-headedness and headaches.    Physical Exam Triage Vital Signs ED Triage Vitals  Enc Vitals Group     BP 02/28/21 1515 122/85     Pulse Rate 02/28/21 1515 95     Resp 02/28/21 1515 17     Temp 02/28/21 1515 98.1 F (36.7 C)     Temp Source 02/28/21 1515 Oral     SpO2 02/28/21 1515 97 %     Weight --      Height --      Head Circumference --      Peak Flow --      Pain Score 02/28/21 1514 8     Pain Loc --      Pain Edu? --      Excl. in GC? --    No data found.  Updated Vital Signs BP 122/85 (BP Location: Left Arm)   Pulse 95   Temp 98.1 F (36.7 C) (Oral)   Resp 17   LMP 02/05/2021   SpO2 97%   Visual Acuity Right Eye Distance:   Left Eye Distance:   Bilateral  Distance:    Right Eye Near:   Left Eye Near:    Bilateral Near:     Physical Exam Vitals reviewed.  Constitutional:      General: She is not in acute distress.    Appearance: Normal appearance. She is not ill-appearing.  HENT:     Head: Normocephalic and atraumatic.     Mouth/Throat:  Mouth: Mucous membranes are moist.     Comments: Moist mucous membranes Eyes:     Extraocular Movements: Extraocular movements intact.     Pupils: Pupils are equal, round, and reactive to light.  Cardiovascular:     Rate and Rhythm: Normal rate and regular rhythm.     Heart sounds: Normal heart sounds.  Pulmonary:     Effort: Pulmonary effort is normal.     Breath sounds: Normal breath sounds. No wheezing, rhonchi or rales.  Abdominal:     General: Bowel sounds are normal. There is distension.     Palpations: Abdomen is soft. There is no mass.     Tenderness: There is generalized abdominal tenderness. There is no right CVA tenderness, left CVA tenderness, guarding or rebound. Negative signs include Murphy's sign, Rovsing's sign and McBurney's sign.     Comments: Comfortable throughout the exam BS positive throughout  No mass or hernia  Skin:    General: Skin is warm.     Capillary Refill: Capillary refill takes less than 2 seconds.     Comments: Good skin turgor  Neurological:     General: No focal deficit present.     Mental Status: She is alert and oriented to person, place, and time.  Psychiatric:        Mood and Affect: Mood normal.        Behavior: Behavior normal.     UC Treatments / Results  Labs (all labs ordered are listed, but only abnormal results are displayed) Labs Reviewed - No data to display  EKG   Radiology No results found.  Procedures Procedures (including critical care time)  Medications Ordered in UC Medications - No data to display  Initial Impression / Assessment and Plan / UC Course  I have reviewed the triage vital signs and the nursing  notes.  Pertinent labs & imaging results that were available during my care of the patient were reviewed by me and considered in my medical decision making (see chart for details).     This patient is a very pleasant 45 y.o. year old female presenting with viral gastroenteritis following exposure to this. Afebrile, nontachycardic, abd pain but no CVAT.  History of cholecystectomy and appendectomy.  She appears reasonably well-hydrated.  Of note, she did recently finish antibiotics (Flagyl) for BV, but she declines stool sample today. States she is not pregnant or breastfeeding.  She is apparently allergic to Zofran, Reglan, Compazine; Bentyl sent.  She states that the only medicine that helps with her nausea is Xanax, discussed that that is not medically advised. Strict ED return precautions discussed. Patient verbalizes understanding and agreement.   .   Final Clinical Impressions(s) / UC Diagnoses   Final diagnoses:  Viral gastroenteritis     Discharge Instructions      -Bentyl up to twice daily -Drink plenty of fluids and eat a bland diet -Severe abd pain, unable to keep fluids down despite treatment, etc- head straight to the ED   ED Prescriptions     Medication Sig Dispense Auth. Provider   dicyclomine (BENTYL) 20 MG tablet Take 1 tablet (20 mg total) by mouth 2 (two) times daily. 20 tablet Rhys Martini, PA-C      PDMP not reviewed this encounter.   Rhys Martini, PA-C 02/28/21 1603

## 2021-02-28 NOTE — ED Triage Notes (Signed)
Pt presents with diarrhea, chills, bilateral leg swelling, stomach pain X 2 days.

## 2021-02-28 NOTE — Discharge Instructions (Addendum)
-  Bentyl up to twice daily -Drink plenty of fluids and eat a bland diet -Severe abd pain, unable to keep fluids down despite treatment, etc- head straight to the ED

## 2021-03-04 ENCOUNTER — Other Ambulatory Visit: Payer: Self-pay

## 2021-03-04 ENCOUNTER — Telehealth (HOSPITAL_COMMUNITY): Payer: Self-pay | Admitting: *Deleted

## 2021-03-04 ENCOUNTER — Emergency Department (HOSPITAL_BASED_OUTPATIENT_CLINIC_OR_DEPARTMENT_OTHER): Payer: Self-pay | Admitting: Radiology

## 2021-03-04 ENCOUNTER — Encounter (HOSPITAL_BASED_OUTPATIENT_CLINIC_OR_DEPARTMENT_OTHER): Payer: Self-pay

## 2021-03-04 ENCOUNTER — Emergency Department (HOSPITAL_BASED_OUTPATIENT_CLINIC_OR_DEPARTMENT_OTHER)
Admission: EM | Admit: 2021-03-04 | Discharge: 2021-03-04 | Disposition: A | Discharge status: Home / Self Care | Payer: Self-pay | Attending: Emergency Medicine | Admitting: Emergency Medicine

## 2021-03-04 DIAGNOSIS — R0602 Shortness of breath: Secondary | ICD-10-CM | POA: Insufficient documentation

## 2021-03-04 DIAGNOSIS — R0789 Other chest pain: Secondary | ICD-10-CM | POA: Insufficient documentation

## 2021-03-04 DIAGNOSIS — R2243 Localized swelling, mass and lump, lower limb, bilateral: Secondary | ICD-10-CM | POA: Insufficient documentation

## 2021-03-04 DIAGNOSIS — Z5321 Procedure and treatment not carried out due to patient leaving prior to being seen by health care provider: Secondary | ICD-10-CM | POA: Insufficient documentation

## 2021-03-04 LAB — CBC
HCT: 36.1 % (ref 36.0–46.0)
Hemoglobin: 12.3 g/dL (ref 12.0–15.0)
MCH: 30.8 pg (ref 26.0–34.0)
MCHC: 34.1 g/dL (ref 30.0–36.0)
MCV: 90.5 fL (ref 80.0–100.0)
Platelets: 331 10*3/uL (ref 150–400)
RBC: 3.99 MIL/uL (ref 3.87–5.11)
RDW: 12.7 % (ref 11.5–15.5)
WBC: 7.4 10*3/uL (ref 4.0–10.5)
nRBC: 0 % (ref 0.0–0.2)

## 2021-03-04 LAB — BASIC METABOLIC PANEL
Anion gap: 9 (ref 5–15)
BUN: 15 mg/dL (ref 6–20)
CO2: 25 mmol/L (ref 22–32)
Calcium: 9.1 mg/dL (ref 8.9–10.3)
Chloride: 103 mmol/L (ref 98–111)
Creatinine, Ser: 0.76 mg/dL (ref 0.44–1.00)
GFR, Estimated: >60 mL/min (ref >60)
Glucose, Bld: 172 mg/dL — ABNORMAL HIGH (ref 70–99)
Potassium: 3.9 mmol/L (ref 3.5–5.1)
Sodium: 137 mmol/L (ref 135–145)

## 2021-03-04 LAB — TROPONIN I (HIGH SENSITIVITY): Troponin I (High Sensitivity): <2 ng/L (ref <18)

## 2021-03-04 NOTE — ED Triage Notes (Signed)
Pt presents with Left side Chest pressure and SOB x30 mins. Pt also reports ongoing edema to BLE x2 days

## 2021-03-04 NOTE — Telephone Encounter (Signed)
VM left per patient stating she would like to speak with Link Snuffer PA re her Neurontin being restarted. She states the Klonopin alone is not very effective with her anxiety and she would like to restart Neurontin hoping the two medicines would be more helpful. Will forward this concern to her provider.

## 2021-03-05 ENCOUNTER — Emergency Department (HOSPITAL_BASED_OUTPATIENT_CLINIC_OR_DEPARTMENT_OTHER)
Admission: EM | Admit: 2021-03-05 | Discharge: 2021-03-05 | Disposition: A | Discharge status: Home / Self Care | Payer: Self-pay | Attending: Emergency Medicine | Admitting: Emergency Medicine

## 2021-03-05 ENCOUNTER — Other Ambulatory Visit: Payer: Self-pay

## 2021-03-05 ENCOUNTER — Encounter (HOSPITAL_BASED_OUTPATIENT_CLINIC_OR_DEPARTMENT_OTHER): Payer: Self-pay | Admitting: Obstetrics and Gynecology

## 2021-03-05 DIAGNOSIS — E119 Type 2 diabetes mellitus without complications: Secondary | ICD-10-CM | POA: Insufficient documentation

## 2021-03-05 DIAGNOSIS — R1012 Left upper quadrant pain: Secondary | ICD-10-CM | POA: Insufficient documentation

## 2021-03-05 DIAGNOSIS — R600 Localized edema: Secondary | ICD-10-CM | POA: Insufficient documentation

## 2021-03-05 DIAGNOSIS — R635 Abnormal weight gain: Secondary | ICD-10-CM | POA: Insufficient documentation

## 2021-03-05 DIAGNOSIS — R0602 Shortness of breath: Secondary | ICD-10-CM | POA: Insufficient documentation

## 2021-03-05 DIAGNOSIS — R601 Generalized edema: Secondary | ICD-10-CM

## 2021-03-05 DIAGNOSIS — R5383 Other fatigue: Secondary | ICD-10-CM | POA: Insufficient documentation

## 2021-03-05 DIAGNOSIS — Z7984 Long term (current) use of oral hypoglycemic drugs: Secondary | ICD-10-CM | POA: Insufficient documentation

## 2021-03-05 DIAGNOSIS — Z794 Long term (current) use of insulin: Secondary | ICD-10-CM | POA: Insufficient documentation

## 2021-03-05 LAB — CBC WITH DIFFERENTIAL/PLATELET
Abs Immature Granulocytes: 0.04 10*3/uL (ref 0.00–0.07)
Basophils Absolute: 0 10*3/uL (ref 0.0–0.1)
Basophils Relative: 0 %
Eosinophils Absolute: 0.1 10*3/uL (ref 0.0–0.5)
Eosinophils Relative: 2 %
HCT: 35.9 % — ABNORMAL LOW (ref 36.0–46.0)
Hemoglobin: 12.2 g/dL (ref 12.0–15.0)
Immature Granulocytes: 1 %
Lymphocytes Relative: 26 %
Lymphs Abs: 2 10*3/uL (ref 0.7–4.0)
MCH: 30.8 pg (ref 26.0–34.0)
MCHC: 34 g/dL (ref 30.0–36.0)
MCV: 90.7 fL (ref 80.0–100.0)
Monocytes Absolute: 0.6 10*3/uL (ref 0.1–1.0)
Monocytes Relative: 8 %
Neutro Abs: 4.8 10*3/uL (ref 1.7–7.7)
Neutrophils Relative %: 63 %
Platelets: 333 10*3/uL (ref 150–400)
RBC: 3.96 MIL/uL (ref 3.87–5.11)
RDW: 13 % (ref 11.5–15.5)
WBC: 7.6 10*3/uL (ref 4.0–10.5)
nRBC: 0 % (ref 0.0–0.2)

## 2021-03-05 LAB — URINALYSIS, ROUTINE W REFLEX MICROSCOPIC
Bilirubin Urine: NEGATIVE
Glucose, UA: >1000 mg/dL — AB
Ketones, ur: NEGATIVE mg/dL
Leukocytes,Ua: NEGATIVE
Nitrite: NEGATIVE
Protein, ur: NEGATIVE mg/dL
Specific Gravity, Urine: 1.03 (ref 1.005–1.030)
pH: 5.5 (ref 5.0–8.0)

## 2021-03-05 LAB — T4, FREE: Free T4: 0.71 ng/dL (ref 0.61–1.12)

## 2021-03-05 LAB — BRAIN NATRIURETIC PEPTIDE: B Natriuretic Peptide: 31.9 pg/mL (ref 0.0–100.0)

## 2021-03-05 LAB — COMPREHENSIVE METABOLIC PANEL
ALT: 18 U/L (ref 0–44)
AST: 13 U/L — ABNORMAL LOW (ref 15–41)
Albumin: 3.8 g/dL (ref 3.5–5.0)
Alkaline Phosphatase: 64 U/L (ref 38–126)
Anion gap: 7 (ref 5–15)
BUN: 16 mg/dL (ref 6–20)
CO2: 28 mmol/L (ref 22–32)
Calcium: 9.2 mg/dL (ref 8.9–10.3)
Chloride: 100 mmol/L (ref 98–111)
Creatinine, Ser: 0.65 mg/dL (ref 0.44–1.00)
GFR, Estimated: >60 mL/min (ref >60)
Glucose, Bld: 205 mg/dL — ABNORMAL HIGH (ref 70–99)
Potassium: 3.9 mmol/L (ref 3.5–5.1)
Sodium: 135 mmol/L (ref 135–145)
Total Bilirubin: 0.4 mg/dL (ref 0.3–1.2)
Total Protein: 6.7 g/dL (ref 6.5–8.1)

## 2021-03-05 LAB — CBG MONITORING, ED: Glucose-Capillary: 203 mg/dL — ABNORMAL HIGH (ref 70–99)

## 2021-03-05 LAB — TROPONIN I (HIGH SENSITIVITY)
Troponin I (High Sensitivity): <2 ng/L (ref <18)
Troponin I (High Sensitivity): <2 ng/L (ref <18)

## 2021-03-05 LAB — D-DIMER, QUANTITATIVE: D-Dimer, Quant: 0.37 ug/mL-FEU (ref 0.00–0.50)

## 2021-03-05 LAB — TSH: TSH: 3.727 u[IU]/mL (ref 0.350–4.500)

## 2021-03-05 LAB — PREGNANCY, URINE: Preg Test, Ur: NEGATIVE

## 2021-03-05 LAB — CORTISOL: Cortisol, Plasma: 3.6 ug/dL

## 2021-03-05 MED ORDER — PANTOPRAZOLE SODIUM 20 MG PO TBEC: 40.0000 mg | DELAYED_RELEASE_TABLET | Freq: Every day | ORAL | 0 refills | Status: DC

## 2021-03-05 MED ORDER — GABAPENTIN 100 MG PO CAPS: 100.0000 mg | ORAL_CAPSULE | Freq: Three times a day (TID) | ORAL | 0 refills | Status: DC

## 2021-03-05 MED ORDER — FENOFIBRATE 48 MG PO TABS: 48.0000 mg | ORAL_TABLET | Freq: Every day | ORAL | 2 refills | Status: DC

## 2021-03-05 MED ORDER — FUROSEMIDE 20 MG PO TABS: 20.0000 mg | ORAL_TABLET | Freq: Every day | ORAL | 0 refills | Status: DC

## 2021-03-05 NOTE — ED Notes (Signed)
ED Provider at bedside. 

## 2021-03-05 NOTE — ED Triage Notes (Signed)
Patient presents to the ER for possible cushing. Patient reports she has swelling to her legs. Patient reports she has the buffalo hump between her shoulder, leg swelling, infertility, and she feels short of breath. Patient reports she wants to be checked for Cushings.

## 2021-03-05 NOTE — ED Provider Notes (Signed)
[facility] EMERGENCY DEPT Provider Note   CSN: [id number] Arrival date & time: 03/05/21  1610     History Chief Complaint  Patient presents with   Leg Swelling    Danielle Barker is a 45 y.o. female.  HPI     10 days Swelling everywhere  18lb in 14 days weight gain Neuropathy more severe, DM have been difficult to control glucose Acne, infertility, mood swings, insomnia, infertility, diagnosed younger. Has buffalo hump.  Trying tylenol, motrin, muscle pain, pain with eating Saw endocrinologist for DM in [place].   Had stress test, heart evaluation.  Triglycerides high. Can't take statins because of leg pain. Take tricor almost out of it. Established with new PCP Felt like swelling and neuropathy getting worse which is why she presented now. Cheeks getting flushed.   Shortness of breath, feels like it is from pushing on diaphragm. No chest pain, just pressure from abdomen.   Past Medical History:  Diagnosis Date   Diabetes mellitus without complication (HCC)    High cholesterol    High triglycerides    Mood disorder [facility])     Patient Active Problem List   Diagnosis Date Noted   Insomnia 02/12/2021   Bipolar 2 disorder (HCC) 12/25/2020   Generalized anxiety disorder with panic attacks 12/25/2020    Past Surgical History:  Procedure Laterality Date   APPENDECTOMY     CHOLECYSTECTOMY     SHOULDER SURGERY Left    TONSILLECTOMY       OB History     Gravida  1   Para      Term      Preterm      AB      Living  0      SAB      IAB      Ectopic      Multiple      Live Births  0           Family History  Problem Relation Age of Onset   Cancer Mother    Heart failure Mother    Cancer Father     Social History   Tobacco Use   Smoking status: Never   Smokeless tobacco: Never  Vaping Use   Vaping Use: Never used  Substance Use Topics   Alcohol use: Not Currently   Drug use: Never    Home Medications Prior to  Admission medications   Medication Sig Start Date End Date Taking? Authorizing Provider  furosemide (LASIX) 20 MG tablet Take 1 tablet (20 mg total) by mouth daily for 5 days. 03/05/21 03/10/21 Yes Alvira Monday, MD  gabapentin (NEURONTIN) 100 MG capsule Take 1 capsule (100 mg total) by mouth 3 (three) times daily. 03/05/21 04/04/21 Yes Alvira Monday, MD  pantoprazole (PROTONIX) 20 MG tablet Take 2 tablets (40 mg total) by mouth daily. 03/05/21 04/04/21 Yes Alvira Monday, MD  ARIPiprazole (ABILIFY) 30 MG tablet Take 1 tablet (30 mg total) by mouth daily. 01/30/21   Nwoko, Tommas Olp, PA  clonazePAM (KLONOPIN) 1 MG tablet Take 1 tablet (1 mg total) by mouth 2 (two) times daily as needed for anxiety. 03/01/21 03/01/22  Nwoko, Tommas Olp, PA  dicyclomine (BENTYL) 20 MG tablet Take 1 tablet (20 mg total) by mouth 2 (two) times daily. 02/28/21   Rhys Martini, PA-C  escitalopram (LEXAPRO) 20 MG tablet Take 2 tablets (40 mg total) by mouth daily. 01/30/21   Nwoko, Tommas Olp, PA  fenofibrate (TRICOR) 48 MG tablet  Take 1 tablet (48 mg total) by mouth daily. 03/05/21 04/04/21  Alvira Monday, MD  glimepiride (AMARYL) 2 MG tablet Take 1 tablet (2 mg total) by mouth daily with breakfast. 01/06/21 02/05/21  Caccavale, Sophia, PA-C  hydrochlorothiazide (HYDRODIURIL) 12.5 MG tablet Take 1 tablet (12.5 mg total) by mouth daily for 5 days. 02/17/21 02/22/21  Jeannie Fend, PA-C  hydrOXYzine (ATARAX/VISTARIL) 25 MG tablet Take 1 tablet (25 mg total) by mouth 3 (three) times daily as needed for anxiety. 11/25/20   Ajibola, Ene A, NP  insulin aspart (NOVOLOG) 100 UNIT/ML injection Inject into the skin 3 (three) times daily with meals.    [provider]  insulin detemir (LEVEMIR) 100 UNIT/ML injection Inject 100 Units into the skin daily.    [provider]  lamoTRIgine (LAMICTAL) 25 MG tablet Take 1 tablet (25 mg total) by mouth daily. 12/25/20 12/25/21  Nwoko, Tommas Olp, PA  lisinopril  (PRINIVIL,ZESTRIL) 5 MG tablet Take 5 mg by mouth daily.    [provider]  metFORMIN (GLUCOPHAGE) 1000 MG tablet Take 0.5 tablets (500 mg total) by mouth 2 (two) times daily with a meal. 02/15/21   Upstill, Melvenia Beam, PA-C  naproxen (NAPROSYN) 500 MG tablet Take 1 tablet (500 mg total) by mouth 2 (two) times daily. 09/18/20   Sabas Sous, MD  nitroGLYCERIN (NITROSTAT) 0.4 MG SL tablet Place 1 tablet (0.4 mg total) under the tongue every 5 (five) minutes as needed for chest pain. Up to 2 doses 09/12/20   Terald Sleeper, MD  rosuvastatin (CRESTOR) 20 MG tablet Take 1 tablet (20 mg total) by mouth daily for 30 doses. 09/12/20 10/12/20  Terald Sleeper, MD  traZODone (DESYREL) 50 MG tablet Take 1 tablet (50 mg total) by mouth at bedtime as needed for sleep. 02/12/21   Nwoko, Tommas Olp, PA  citalopram (CELEXA) 40 MG tablet Take 40 mg by mouth daily.  11/25/20  [provider]    Allergies    Compazine [prochlorperazine], Dilaudid [hydromorphone], Reglan [metoclopramide], and Zofran [ondansetron]  Review of Systems   Review of Systems  Constitutional:  Positive for appetite change, fatigue and unexpected weight change. Negative for fever.  HENT:  Negative for sore throat.   Respiratory:  Positive for cough and shortness of breath.   Cardiovascular:  Negative for chest pain.  Gastrointestinal:  Positive for abdominal distention and abdominal pain. Diarrhea: some days better than others, since satrting metformin. Vomiting: last week. Genitourinary:  Negative for dysuria.  Musculoskeletal:  Positive for arthralgias and myalgias.  Skin:  Negative for rash.  Neurological:  Negative for headaches (when wearing bandana will have it but takes it off and is better).   Physical Exam Updated Vital Signs BP 137/76   Pulse 79   Temp 97.9 F (36.6 C) (Oral)   Resp 19   Ht 5' 5.5" (1.664 m)   Wt 98.9 kg   LMP 02/05/2021   SpO2 100%   BMI 35.73 kg/m   Physical Exam Vitals and  nursing note reviewed.  Constitutional:      General: She is not in acute distress.    Appearance: She is well-developed. She is not diaphoretic.  HENT:     Head: Normocephalic and atraumatic.  Eyes:     Conjunctiva/sclera: Conjunctivae normal.  Cardiovascular:     Rate and Rhythm: Normal rate and regular rhythm.     Heart sounds: Normal heart sounds. No murmur heard.   No friction rub. No gallop.  Pulmonary:  Effort: Pulmonary effort is normal. No respiratory distress.     Breath sounds: Normal breath sounds. No wheezing or rales.  Abdominal:     General: There is no distension.     Palpations: Abdomen is soft.     Tenderness: There is abdominal tenderness (mild periumbilical, LUQ). There is no guarding.  Musculoskeletal:        General: No tenderness.     Cervical back: Normal range of motion.     Right lower leg: Edema present.     Left lower leg: Edema present.  Skin:    General: Skin is warm and dry.     Findings: No erythema or rash.  Neurological:     Mental Status: She is alert and oriented to person, place, and time.    ED Results / Procedures / Treatments   Labs (all labs ordered are listed, but only abnormal results are displayed) Labs Reviewed  CBC WITH DIFFERENTIAL/PLATELET - Abnormal; Notable for the following components:      Result Value   HCT 35.9 (*)    All other components within normal limits  COMPREHENSIVE METABOLIC PANEL - Abnormal; Notable for the following components:   Glucose, Bld 205 (*)    AST 13 (*)    All other components within normal limits  URINALYSIS, ROUTINE W REFLEX MICROSCOPIC - Abnormal; Notable for the following components:   Glucose, UA >1,000 (*)    Hgb urine dipstick LARGE (*)    Bacteria, UA RARE (*)    All other components within normal limits  CBG MONITORING, ED - Abnormal; Notable for the following components:   Glucose-Capillary 203 (*)    All other components within normal limits  TSH  T4, FREE  CORTISOL  BRAIN  NATRIURETIC PEPTIDE  PREGNANCY, URINE  D-DIMER, QUANTITATIVE  TROPONIN I (HIGH SENSITIVITY)  TROPONIN I (HIGH SENSITIVITY)    EKG EKG Interpretation  Date/Time:  Wednesday March 05 2021 16:49:02 EST Ventricular Rate:  85 PR Interval:  159 QRS Duration: 82 QT Interval:  355 QTC Calculation: 423 R Axis:   79 Text Interpretation: Sinus rhythm No significant change since last tracing Confirmed by Alvira Monday ([id number]) on 03/05/2021 7:29:57 PM  Radiology DG Chest 2 View  Result Date: 03/04/2021 CLINICAL DATA:  Left-sided chest pressure and shortness of breath. Lower extremity edema. EXAM: CHEST - 2 VIEW COMPARISON:  02/16/2021 FINDINGS: The cardiomediastinal silhouette is unchanged with normal heart size. No airspace consolidation, edema, pleural effusion, or pneumothorax is identified. No acute osseous abnormality is seen. IMPRESSION: No active cardiopulmonary disease. Electronically Signed   By: Sebastian Ache M.D.   On: 03/04/2021 19:30    Procedures Procedures   Medications Ordered in ED Medications - No data to display  ED Course  I have reviewed the triage vital signs and the nursing notes.  Pertinent labs & imaging results that were available during my care of the patient were reviewed by me and considered in my medical decision making (see chart for details).    MDM Rules/Calculators/A&P                           45yo female with history of DM, hyperlipidemia, bipolar, GAD, presents with multiple concerns including swelling, weight gain, abdominal discomfort, dyspnea, and is worried she may have Cushing's.   Labs obtained show no anemia, no significant electrolyte abnormalities-mild hyperglycemia without signs of DKA.  DDimer negative and low suspicion for PE. CXR completed last night  shows no sign of pneumonia or pulmonary edema. BNP WNL, doubt CHF. UA with glucose, pt on menses with hgb present. Pregnancy test negative. Troponin negative, doubt ACS.  Sent thyroid  studies and cortisol which were not back at time of discharge--no sign of adrenal crisis or thyroid emergency. Values did return and are normal. Does have some edema on exam of unclear etiology given normal albumin, no other clinical signs of CHF. Requesting diuresis-feel a few days of lasix is not unreasonable. Given 5 days of 20mg , rx for gabapentin for neuropathy related pain, pantoprazole for suspected gastritis, refill of tricor.  Recommend continued follow up with PCP regarding evaluation for underlying etiology of her symptoms. Do not see emergent etiology of symptoms to require hospitalization or surgery.  Patient discharged in stable condition with understanding of reasons to return.    Final Clinical Impression(s) / ED Diagnoses Final diagnoses:  Generalized edema  Other fatigue  Weight gain  Shortness of breath    Rx / DC Orders ED Discharge Orders          Ordered    gabapentin (NEURONTIN) 100 MG capsule  3 times daily        03/05/21 2034    furosemide (LASIX) 20 MG tablet  Daily        03/05/21 2034    pantoprazole (PROTONIX) 20 MG tablet  Daily        03/05/21 2034    fenofibrate (TRICOR) 48 MG tablet  Daily,   Status:  Discontinued        03/05/21 2034    fenofibrate (TRICOR) 48 MG tablet  Daily        03/05/21 2036             2037, MD 03/07/21 1007

## 2021-03-07 ENCOUNTER — Other Ambulatory Visit: Payer: Self-pay

## 2021-03-08 ENCOUNTER — Emergency Department (HOSPITAL_BASED_OUTPATIENT_CLINIC_OR_DEPARTMENT_OTHER)
Admission: EM | Admit: 2021-03-08 | Discharge: 2021-03-09 | Disposition: A | Discharge status: Home / Self Care | Payer: Self-pay | Attending: Emergency Medicine | Admitting: Emergency Medicine

## 2021-03-08 ENCOUNTER — Other Ambulatory Visit: Payer: Self-pay

## 2021-03-08 DIAGNOSIS — R600 Localized edema: Secondary | ICD-10-CM | POA: Insufficient documentation

## 2021-03-08 DIAGNOSIS — M79604 Pain in right leg: Secondary | ICD-10-CM

## 2021-03-08 DIAGNOSIS — M79662 Pain in left lower leg: Secondary | ICD-10-CM | POA: Insufficient documentation

## 2021-03-08 DIAGNOSIS — M79661 Pain in right lower leg: Secondary | ICD-10-CM | POA: Insufficient documentation

## 2021-03-08 DIAGNOSIS — E119 Type 2 diabetes mellitus without complications: Secondary | ICD-10-CM | POA: Insufficient documentation

## 2021-03-08 DIAGNOSIS — Z794 Long term (current) use of insulin: Secondary | ICD-10-CM | POA: Insufficient documentation

## 2021-03-08 DIAGNOSIS — Z7984 Long term (current) use of oral hypoglycemic drugs: Secondary | ICD-10-CM | POA: Insufficient documentation

## 2021-03-08 DIAGNOSIS — Z79899 Other long term (current) drug therapy: Secondary | ICD-10-CM | POA: Insufficient documentation

## 2021-03-09 ENCOUNTER — Encounter (HOSPITAL_BASED_OUTPATIENT_CLINIC_OR_DEPARTMENT_OTHER): Payer: Self-pay

## 2021-03-09 MED ORDER — OXYCODONE-ACETAMINOPHEN 5-325 MG PO TABS
2.0000 | ORAL_TABLET | Freq: Once | ORAL | Status: AC
  Administered 2021-03-09: 2 via ORAL
  Filled 2021-03-09: qty 2

## 2021-03-09 MED ORDER — GABAPENTIN 100 MG PO CAPS: 200.0000 mg | ORAL_CAPSULE | Freq: Three times a day (TID) | ORAL | 0 refills | Status: DC

## 2021-03-09 MED ORDER — FUROSEMIDE 40 MG PO TABS: 40.0000 mg | ORAL_TABLET | Freq: Two times a day (BID) | ORAL | 0 refills | Status: DC | PRN

## 2021-03-09 NOTE — Discharge Instructions (Signed)
I am not clear on the etiology of your leg swelling.  You have had multiple tests done and all of them pretty reassuring.  I am not convinced that this is heart failure however we can try another round of increased dose of Lasix to see if that helps since it helped some last time.  I think there are some lifestyle modifications that might will be made.  Standing on your feet all day can definitely make your symptoms worse.  You need to get some compression stockings that fit the correct way and it sounds like you have a plan to do that.  Also reducing salt in your diet if possible would help.  As far as the Neurontin goes: Please go ahead and take 200 mg for your nightly dose tonight and for the next 4 nights and continue 100 mg for the other 2 doses.   After those 4 nights start taking 200 mg in the afternoon and then 4 days later you should be on 200 mg 3 times a day.  You can increase in this fashion up to 300 mg 3 times a day.  By that time you should be seeing  Your primary doctor.  Please take Lasix as needed. however if you do not think it is helping it can damage your kidneys and cause other electrode abnormalities.  So stop it if you do not think it is helping.

## 2021-03-09 NOTE — ED Provider Notes (Signed)
[facility] EMERGENCY DEPT Provider Note   CSN: [id number] Arrival date & time: 03/08/21  2355     History Chief Complaint  Patient presents with   Leg Pain   Leg Swelling    Danielle Barker is a 45 y.o. female.  45 year old female who presents to the emergency department today secondary to leg pain.  Patient states that this is been going on for a little while.  It is difficult to figure out exactly how long its been going on but is been going on for at least a couple weeks.  She works as a Theatre stage manager.  She states it is so bad at night with pain that she cannot go to sleep and she is miserable.  She states that the swelling is a lot worse.  She has no shortness of breath.  She has been here couple times in the past and had work-ups done that were unremarkable.  She has tried Lasix and Neurontin and feels it may be increasing his doses might help.  She has stockings but does not do true compression stockings.  No fevers.  No cough.  No diagnosis of heart failure, kidney failure or hypoalbuminemia.  Earliest appointment that she could get for primary care was December 4.   Leg Pain     Past Medical History:  Diagnosis Date   Diabetes mellitus without complication (HCC)    High cholesterol    High triglycerides    Mood disorder [facility])     Patient Active Problem List   Diagnosis Date Noted   Insomnia 02/12/2021   Bipolar 2 disorder (HCC) 12/25/2020   Generalized anxiety disorder with panic attacks 12/25/2020    Past Surgical History:  Procedure Laterality Date   APPENDECTOMY     CHOLECYSTECTOMY     SHOULDER SURGERY Left    TONSILLECTOMY       OB History     Gravida  1   Para      Term      Preterm      AB      Living  0      SAB      IAB      Ectopic      Multiple      Live Births  0           Family History  Problem Relation Age of Onset   Cancer Mother    Heart failure Mother    Cancer Father     Social History    Tobacco Use   Smoking status: Never   Smokeless tobacco: Never  Vaping Use   Vaping Use: Never used  Substance Use Topics   Alcohol use: Not Currently   Drug use: Never    Home Medications Prior to Admission medications   Medication Sig Start Date End Date Taking? Authorizing Provider  furosemide (LASIX) 40 MG tablet Take 1 tablet (40 mg total) by mouth 2 (two) times daily as needed. 03/09/21  Yes Nehal Witting, Barbara Cower, MD  gabapentin (NEURONTIN) 100 MG capsule Take 2 capsules (200 mg total) by mouth 3 (three) times daily. 03/09/21 04/08/21 Yes Matthan Sledge, Barbara Cower, MD  ARIPiprazole (ABILIFY) 30 MG tablet Take 1 tablet (30 mg total) by mouth daily. 01/30/21   Nwoko, Tommas Olp, PA  clonazePAM (KLONOPIN) 1 MG tablet Take 1 tablet (1 mg total) by mouth 2 (two) times daily as needed for anxiety. 03/01/21 03/01/22  Nwoko, Tommas Olp, PA  dicyclomine (BENTYL) 20 MG tablet Take 1  tablet (20 mg total) by mouth 2 (two) times daily. 02/28/21   Rhys Martini, PA-C  escitalopram (LEXAPRO) 20 MG tablet Take 2 tablets (40 mg total) by mouth daily. 01/30/21   Nwoko, Tommas Olp, PA  fenofibrate (TRICOR) 48 MG tablet Take 1 tablet (48 mg total) by mouth daily. 03/05/21 04/04/21  Alvira Monday, MD  gabapentin (NEURONTIN) 100 MG capsule Take 1 capsule (100 mg total) by mouth 3 (three) times daily. 03/05/21 04/04/21  Alvira Monday, MD  glimepiride (AMARYL) 2 MG tablet Take 1 tablet (2 mg total) by mouth daily with breakfast. 01/06/21 02/05/21  Caccavale, Sophia, PA-C  hydrochlorothiazide (HYDRODIURIL) 12.5 MG tablet Take 1 tablet (12.5 mg total) by mouth daily for 5 days. 02/17/21 02/22/21  Jeannie Fend, PA-C  hydrOXYzine (ATARAX/VISTARIL) 25 MG tablet Take 1 tablet (25 mg total) by mouth 3 (three) times daily as needed for anxiety. 11/25/20   Ajibola, Ene A, NP  insulin aspart (NOVOLOG) 100 UNIT/ML injection Inject into the skin 3 (three) times daily with meals.    [provider]  insulin detemir (LEVEMIR) 100  UNIT/ML injection Inject 100 Units into the skin daily.    [provider]  lamoTRIgine (LAMICTAL) 25 MG tablet Take 1 tablet (25 mg total) by mouth daily. 12/25/20 12/25/21  Nwoko, Tommas Olp, PA  lisinopril (PRINIVIL,ZESTRIL) 5 MG tablet Take 5 mg by mouth daily.    [provider]  metFORMIN (GLUCOPHAGE) 1000 MG tablet Take 0.5 tablets (500 mg total) by mouth 2 (two) times daily with a meal. 02/15/21   Upstill, Melvenia Beam, PA-C  naproxen (NAPROSYN) 500 MG tablet Take 1 tablet (500 mg total) by mouth 2 (two) times daily. 09/18/20   Sabas Sous, MD  nitroGLYCERIN (NITROSTAT) 0.4 MG SL tablet Place 1 tablet (0.4 mg total) under the tongue every 5 (five) minutes as needed for chest pain. Up to 2 doses 09/12/20   Terald Sleeper, MD  pantoprazole (PROTONIX) 20 MG tablet Take 2 tablets (40 mg total) by mouth daily. 03/05/21 04/04/21  Alvira Monday, MD  rosuvastatin (CRESTOR) 20 MG tablet Take 1 tablet (20 mg total) by mouth daily for 30 doses. 09/12/20 10/12/20  Terald Sleeper, MD  traZODone (DESYREL) 50 MG tablet Take 1 tablet (50 mg total) by mouth at bedtime as needed for sleep. 02/12/21   Nwoko, Tommas Olp, PA  citalopram (CELEXA) 40 MG tablet Take 40 mg by mouth daily.  11/25/20  [provider]    Allergies    Compazine [prochlorperazine], Dilaudid [hydromorphone], Reglan [metoclopramide], and Zofran [ondansetron]  Review of Systems   Review of Systems  All other systems reviewed and are negative.  Physical Exam Updated Vital Signs BP 121/78 (BP Location: Right Arm)   Pulse 84   Temp 98 F (36.7 C)   Resp 18   Ht 5\' 5"  (1.651 m)   Wt 98 kg   LMP 03/09/2021   SpO2 99%   BMI 35.94 kg/m   Physical Exam Vitals and nursing note reviewed.  Constitutional:      Appearance: She is well-developed.  HENT:     Head: Normocephalic and atraumatic.     Nose: No congestion or rhinorrhea.     Mouth/Throat:     Mouth: Mucous membranes are moist.     Pharynx:  Oropharynx is clear.  Eyes:     Pupils: Pupils are equal, round, and reactive to light.  Cardiovascular:     Rate and Rhythm: Normal rate and regular rhythm.  Pulmonary:     Effort: Pulmonary effort is normal. No respiratory distress.     Breath sounds: No stridor.  Abdominal:     General: Abdomen is flat. There is no distension.  Musculoskeletal:        General: No swelling or tenderness. Normal range of motion.     Cervical back: Normal range of motion.     Right lower leg: Edema (nonpitting) present.     Left lower leg: Edema (nonpitting) present.  Skin:    General: Skin is warm and dry.  Neurological:     General: No focal deficit present.     Mental Status: She is alert.    ED Results / Procedures / Treatments   Labs (all labs ordered are listed, but only abnormal results are displayed) Labs Reviewed - No data to display  EKG None  Radiology No results found.  Procedures Procedures   Medications Ordered in ED Medications  oxyCODONE-acetaminophen (PERCOCET/ROXICET) 5-325 MG per tablet 2 tablet (2 tablets Oral Given 03/09/21 0054)    ED Course  I have reviewed the triage vital signs and the nursing notes.  Pertinent labs & imaging results that were available during my care of the patient were reviewed by me and considered in my medical decision making (see chart for details).    MDM Rules/Calculators/A&P                         I do not see any indication for repeat work-ups that have already been done.  I had a long discussion with her and ultimately we decided that seeing her primary doctor replied be the best neck step.  To temporize things we will increase her Neurontin stepwise for now.  Also will let her try a higher dose of Lasix as it did seem to help minimally.  Patient requesting a few days of Rx for tramadol or tylenol 3 or something. I will provide a dose of percocet here but will hold on prescriptions for same and can address this with her PCP who  can better manage these types of medications.   Final Clinical Impression(s) / ED Diagnoses Final diagnoses:  Pain in both lower extremities    Rx / DC Orders ED Discharge Orders          Ordered    gabapentin (NEURONTIN) 100 MG capsule  3 times daily        03/09/21 0055    furosemide (LASIX) 40 MG tablet  2 times daily PRN        03/09/21 0055             Kaiea Esselman, Barbara Cower, MD 03/09/21 0100

## 2021-03-09 NOTE — ED Triage Notes (Addendum)
Pt is present for ongoing bilateral leg swelling that is painful. Pt has been seen numerous times for leg pain but feels like "the pain has traveled up to my hips and gut". Pt also states her legs feel warm. Pt finished five days of Lasix.

## 2021-03-09 NOTE — ED Notes (Signed)
Pt verbalizes understanding of discharge instructions. Opportunity for questioning and answers were provided. Armand removed by staff, pt discharged from ED to home. Educated to pick up Rx for Lasix and gabapentin.

## 2021-03-18 ENCOUNTER — Other Ambulatory Visit: Payer: Self-pay

## 2021-03-19 ENCOUNTER — Other Ambulatory Visit: Payer: Self-pay

## 2021-03-23 ENCOUNTER — Other Ambulatory Visit: Payer: Self-pay

## 2021-03-23 ENCOUNTER — Encounter (HOSPITAL_BASED_OUTPATIENT_CLINIC_OR_DEPARTMENT_OTHER): Payer: Self-pay

## 2021-03-23 ENCOUNTER — Emergency Department (HOSPITAL_BASED_OUTPATIENT_CLINIC_OR_DEPARTMENT_OTHER)
Admission: EM | Admit: 2021-03-23 | Discharge: 2021-03-23 | Disposition: A | Discharge status: Home / Self Care | Payer: Self-pay | Attending: Emergency Medicine | Admitting: Emergency Medicine

## 2021-03-23 DIAGNOSIS — R600 Localized edema: Secondary | ICD-10-CM

## 2021-03-23 DIAGNOSIS — Z7984 Long term (current) use of oral hypoglycemic drugs: Secondary | ICD-10-CM | POA: Insufficient documentation

## 2021-03-23 DIAGNOSIS — Z794 Long term (current) use of insulin: Secondary | ICD-10-CM | POA: Insufficient documentation

## 2021-03-23 DIAGNOSIS — E119 Type 2 diabetes mellitus without complications: Secondary | ICD-10-CM | POA: Insufficient documentation

## 2021-03-23 DIAGNOSIS — R609 Edema, unspecified: Secondary | ICD-10-CM | POA: Insufficient documentation

## 2021-03-23 LAB — URINALYSIS, ROUTINE W REFLEX MICROSCOPIC
Bilirubin Urine: NEGATIVE
Glucose, UA: 500 mg/dL — AB
Hgb urine dipstick: NEGATIVE
Leukocytes,Ua: NEGATIVE
Nitrite: NEGATIVE
Protein, ur: NEGATIVE mg/dL
Specific Gravity, Urine: 1.032 — ABNORMAL HIGH (ref 1.005–1.030)
pH: 5 (ref 5.0–8.0)

## 2021-03-23 MED ORDER — FUROSEMIDE 40 MG PO TABS: 40.0000 mg | ORAL_TABLET | Freq: Two times a day (BID) | ORAL | 0 refills | Status: DC | PRN

## 2021-03-23 MED ORDER — HYDROCODONE-ACETAMINOPHEN 5-325 MG PO TABS: 1.0000 | ORAL_TABLET | Freq: Four times a day (QID) | ORAL | 0 refills | Status: DC | PRN

## 2021-03-23 NOTE — Discharge Instructions (Signed)
Continue to wear the compression socks, elevate your legs, continue to take the Lasix at this time but follow-up with your new doctor on the 10th as planned.

## 2021-03-23 NOTE — ED Provider Notes (Signed)
[facility] EMERGENCY DEPT Provider Note   CSN: [id number] Arrival date & time: 03/23/21  2032     History Chief Complaint  Patient presents with   Leg Pain   Rash    Danielle Barker is a 45 y.o. female.  Patient is a 45 year old female with a history of diabetes, high cholesterol who has had lower extremity swelling now for the last 3 weeks which is gradually worsening and painful in her feet, ankles and legs.  This is her third visit to the emergency room because of this issue.  She reports she came today because she started noticing a rash on her legs and it worried her.  She has not had fever or any drainage from the legs.  She was seen on 03/05/2021 for this issue and at that time had noted she had gained between 14 and 15 pounds and also has been feeling fullness in her abdomen.  At that time she had blood work done which was reassuring and she was discharged home.  She then returned a week later due to persistent swelling in worsening pain and at that time she was given gabapentin and Lasix.  She reports she does urinate a lot with the Lasix and the swelling may be a little bit better but she continues to have pain.  She works a job where she is on her feet constantly and she is wearing compression socks daily but reports the swelling is not improved.  She has had no change in her daily medications.  She has a follow-up with a new doctor in about a week but because of the rash on her legs today and feeling like she had a lot of pressure when she tried to urinate she came for reevaluation.  The history is provided by the patient and medical records.  Leg Pain Rash     Past Medical History:  Diagnosis Date   Diabetes mellitus without complication (HCC)    High cholesterol    High triglycerides    Mood disorder [facility])     Patient Active Problem List   Diagnosis Date Noted   Insomnia 02/12/2021   Bipolar 2 disorder ([place]) 12/25/2020   Generalized anxiety disorder  with panic attacks 12/25/2020    Past Surgical History:  Procedure Laterality Date   APPENDECTOMY     CHOLECYSTECTOMY     SHOULDER SURGERY Left    TONSILLECTOMY       OB History     Gravida  1   Para      Term      Preterm      AB      Living  0      SAB      IAB      Ectopic      Multiple      Live Births  0           Family History  Problem Relation Age of Onset   Cancer Mother    Heart failure Mother    Cancer Father     Social History   Tobacco Use   Smoking status: Never   Smokeless tobacco: Never  Vaping Use   Vaping Use: Never used  Substance Use Topics   Alcohol use: Not Currently   Drug use: Never    Home Medications Prior to Admission medications   Medication Sig Start Date End Date Taking? Authorizing Provider  HYDROcodone-acetaminophen (NORCO/VICODIN) 5-325 MG tablet Take 1 tablet by mouth every  6 (six) hours as needed for severe pain. 03/23/21  Yes Breyon Blass, Loree Fee, MD  ARIPiprazole (ABILIFY) 30 MG tablet Take 1 tablet (30 mg total) by mouth daily. 01/30/21   Nwoko, Terese Door, PA  clonazePAM (KLONOPIN) 1 MG tablet Take 1 tablet (1 mg total) by mouth 2 (two) times daily as needed for anxiety. 03/01/21 03/01/22  Nwoko, Terese Door, PA  dicyclomine (BENTYL) 20 MG tablet Take 1 tablet (20 mg total) by mouth 2 (two) times daily. 02/28/21   Hazel Sams, PA-C  escitalopram (LEXAPRO) 20 MG tablet Take 2 tablets (40 mg total) by mouth daily. 01/30/21   Nwoko, Terese Door, PA  fenofibrate (TRICOR) 48 MG tablet Take 1 tablet (48 mg total) by mouth daily. 03/05/21 04/04/21  Gareth Morgan, MD  furosemide (LASIX) 40 MG tablet Take 1 tablet (40 mg total) by mouth 2 (two) times daily as needed. 03/23/21   Blanchie Dessert, MD  gabapentin (NEURONTIN) 100 MG capsule Take 1 capsule (100 mg total) by mouth 3 (three) times daily. 03/05/21 04/04/21  Gareth Morgan, MD  gabapentin (NEURONTIN) 100 MG capsule Take 2 capsules (200 mg total) by mouth 3 (three)  times daily. 03/09/21 04/08/21  Mesner, Corene Cornea, MD  glimepiride (AMARYL) 2 MG tablet Take 1 tablet (2 mg total) by mouth daily with breakfast. 01/06/21 02/05/21  Caccavale, Sophia, PA-C  hydrochlorothiazide (HYDRODIURIL) 12.5 MG tablet Take 1 tablet (12.5 mg total) by mouth daily for 5 days. 02/17/21 02/22/21  Tacy Learn, PA-C  hydrOXYzine (ATARAX/VISTARIL) 25 MG tablet Take 1 tablet (25 mg total) by mouth 3 (three) times daily as needed for anxiety. 11/25/20   Ajibola, Ene A, NP  insulin aspart (NOVOLOG) 100 UNIT/ML injection Inject into the skin 3 (three) times daily with meals.    [provider]  insulin detemir (LEVEMIR) 100 UNIT/ML injection Inject 100 Units into the skin daily.    [provider]  lamoTRIgine (LAMICTAL) 25 MG tablet Take 1 tablet (25 mg total) by mouth daily. 12/25/20 12/25/21  Nwoko, Terese Door, PA  lisinopril (PRINIVIL,ZESTRIL) 5 MG tablet Take 5 mg by mouth daily.    [provider]  metFORMIN (GLUCOPHAGE) 1000 MG tablet Take 0.5 tablets (500 mg total) by mouth 2 (two) times daily with a meal. 02/15/21   Upstill, Nehemiah Settle, PA-C  naproxen (NAPROSYN) 500 MG tablet Take 1 tablet (500 mg total) by mouth 2 (two) times daily. 09/18/20   Maudie Flakes, MD  nitroGLYCERIN (NITROSTAT) 0.4 MG SL tablet Place 1 tablet (0.4 mg total) under the tongue every 5 (five) minutes as needed for chest pain. Up to 2 doses 09/12/20   Wyvonnia Dusky, MD  pantoprazole (PROTONIX) 20 MG tablet Take 2 tablets (40 mg total) by mouth daily. 03/05/21 04/04/21  Gareth Morgan, MD  rosuvastatin (CRESTOR) 20 MG tablet Take 1 tablet (20 mg total) by mouth daily for 30 doses. 09/12/20 10/12/20  Wyvonnia Dusky, MD  traZODone (DESYREL) 50 MG tablet Take 1 tablet (50 mg total) by mouth at bedtime as needed for sleep. 02/12/21   Nwoko, Terese Door, PA  citalopram (CELEXA) 40 MG tablet Take 40 mg by mouth daily.  11/25/20  [provider]    Allergies    Compazine  [prochlorperazine], Dilaudid [hydromorphone], Reglan [metoclopramide], and Zofran [ondansetron]  Review of Systems   Review of Systems  Skin:  Positive for rash.  All other systems reviewed and are negative.  Physical Exam Updated Vital Signs BP (!) 148/87 (BP Location: Right Arm)  Pulse 88   Temp 98.4 F (36.9 C)   Resp 18   Ht 5\' 5"  (1.651 m)   Wt 98.9 kg   LMP 03/09/2021   SpO2 98%   BMI 36.28 kg/m   Physical Exam Vitals and nursing note reviewed.  Constitutional:      General: She is not in acute distress.    Appearance: Normal appearance. She is well-developed.  HENT:     Head: Normocephalic and atraumatic.  Eyes:     Pupils: Pupils are equal, round, and reactive to light.  Cardiovascular:     Rate and Rhythm: Normal rate and regular rhythm.     Pulses: Normal pulses.     Heart sounds: Normal heart sounds. No murmur heard.   No friction rub.  Pulmonary:     Effort: Pulmonary effort is normal.     Breath sounds: Normal breath sounds. No wheezing or rales.  Abdominal:     General: Bowel sounds are normal. There is distension.     Palpations: Abdomen is soft.     Tenderness: There is no abdominal tenderness. There is no guarding or rebound.  Musculoskeletal:        General: Tenderness present. Normal range of motion.     Cervical back: Normal range of motion and neck supple.     Right lower leg: Edema present.     Left lower leg: Edema present.     Comments: Pitting edema present in bilateral lower extremities almost up to the knee.  Legs are warm bilaterally and mild erythema noted to bilateral lower extremities.  The skin is shiny but no drainage.  2+ palpable DP pulse present in bilateral lower extremities.  Tenderness with palpation along the feet and ankles.  No petechia  Skin:    General: Skin is warm and dry.     Capillary Refill: Capillary refill takes less than 2 seconds.     Findings: No rash.  Neurological:     Mental Status: She is alert and  oriented to person, place, and time. Mental status is at baseline.     Cranial Nerves: No cranial nerve deficit.  Psychiatric:        Mood and Affect: Mood normal.        Behavior: Behavior normal.    ED Results / Procedures / Treatments   Labs (all labs ordered are listed, but only abnormal results are displayed) Labs Reviewed  URINALYSIS, ROUTINE W REFLEX MICROSCOPIC - Abnormal; Notable for the following components:      Result Value   Specific Gravity, Urine 1.032 (*)    Glucose, UA 500 (*)    Ketones, ur TRACE (*)    All other components within normal limits    EKG None  Radiology No results found.  Procedures Procedures   Medications Ordered in ED Medications - No data to display  ED Course  I have reviewed the triage vital signs and the nursing notes.  Pertinent labs & imaging results that were available during my care of the patient were reviewed by me and considered in my medical decision making (see chart for details).    MDM Rules/Calculators/A&P                           Patient is presenting today due to persistent leg swelling and now concerned because there was a rash on her legs.  She also complained of some burning when she urinates.  When she  was seen on 9 November she had extensive blood testing done with normal renal function, negative D-dimer, normal thyroid studies, normal liver function tests and negative BNP.  She has palpable pulses bilaterally with low suspicion for arterial issues.  Unlikely to be bilateral DVTs and with a negative D-dimer low suspicion for clot.  Patient's breath sounds are clear, no heart murmurs and low suspicion for CHF.  UA today without evidence of infection.  Patient reports unchanged menses and bowel habits.  Patient's edema in her lower extremities is notable.  Offered her a CT of her abdomen pelvis to look for any type of malignancy or obstructing cause causing the swelling in her legs and abdominal distention but she  reported at this time she wanted to wait until she saw her PCP as she does not have insurance and did not want to continue accruing a large bill.  The rash on her legs looks like skin changes related to the start stretching of the skin and swelling.  There is no signs of cellulitis today.  No petechia or symptoms consistent with a vasculitis type rash.  Patient does report that elevation helps and by the end of the day the legs are the most swollen.  Recommended elevating the legs continuing compression socks, continuing the Lasix and following up as planned.  Final Clinical Impression(s) / ED Diagnoses Final diagnoses:  Edema of both lower legs    Rx / DC Orders ED Discharge Orders          Ordered    furosemide (LASIX) 40 MG tablet  2 times daily PRN        03/23/21 2155    HYDROcodone-acetaminophen (NORCO/VICODIN) 5-325 MG tablet  Every 6 hours PRN        03/23/21 2155             Blanchie Dessert, MD 03/23/21 2306

## 2021-03-23 NOTE — ED Triage Notes (Addendum)
Patient states she has been her multiple times for swelling, joint pain and SOB which have not resolved and today she has a rash on bilateral lower legs. Bilateral lower legs are swollen as well.   Patient also reports having a hard time emptying her bladder and right sided flank pain x 2 days.

## 2021-03-26 ENCOUNTER — Other Ambulatory Visit: Payer: Self-pay

## 2021-03-26 ENCOUNTER — Ambulatory Visit (INDEPENDENT_AMBULATORY_CARE_PROVIDER_SITE_OTHER): Payer: No Payment, Other | Admitting: Physician Assistant

## 2021-03-26 ENCOUNTER — Encounter (HOSPITAL_COMMUNITY): Payer: Self-pay | Admitting: Physician Assistant

## 2021-03-26 VITALS — BP 132/79 | HR 73 | Ht 65.0 in | Wt 218.0 lb

## 2021-03-26 DIAGNOSIS — G4700 Insomnia, unspecified: Secondary | ICD-10-CM

## 2021-03-26 DIAGNOSIS — F411 Generalized anxiety disorder: Secondary | ICD-10-CM | POA: Diagnosis not present

## 2021-03-26 DIAGNOSIS — F410 Panic disorder [episodic paroxysmal anxiety] without agoraphobia: Secondary | ICD-10-CM

## 2021-03-26 DIAGNOSIS — F3181 Bipolar II disorder: Secondary | ICD-10-CM

## 2021-03-26 MED ORDER — CLONAZEPAM 1 MG PO TABS: 1.0000 mg | ORAL_TABLET | Freq: Two times a day (BID) | ORAL | 0 refills | Status: DC | PRN

## 2021-03-26 MED ORDER — GABAPENTIN 400 MG PO CAPS
400.0000 mg | ORAL_CAPSULE | Freq: Three times a day (TID) | ORAL | 1 refills | Status: DC
  Filled 2021-03-26: qty 90, 30d supply, fill #0

## 2021-03-26 NOTE — Progress Notes (Addendum)
BH MD/PA/NP OP Progress Note  03/26/2021 9:57 AM Danielle Barker  MRN:  [id number]  Chief Complaint:  Chief Complaint   Medication Management    HPI:   Danielle Barker is a 45 year old female with a past psychiatric history significant for insomnia, generalized anxiety disorder with panic attacks, and bipolar 2 disorder who presents to [facility] for follow-up and medication management. Patient is currently being placed on the following medications:  Clonazepan (Klonopin) 0.5 mg 2 times daily as needed Abilify 30 mg daily Escitalopram (Lexapro) 40 mg daily  Patient reports that she continues to have issues regarding her anxiety. Patient states that her Klonopin is able to take the edge off of her anxiety when at it's peak but it doesn't seem to help lower her baseline anxiety. Patient is currently taking Neurontin 200 mg 3 times daily due to her nerve pain.  Patient reports some management of her anxiety through the use of Neurontin and was wondering if she could continue taking Neurontin for the continued management of her anxiety.  She rates her anxiety an 8 out of 10 but states that her anxiety is a 10 out of 10 when experiencing an anxiety attack.  Patient endorses stressors related to the holidays and her current job.  Patient states that she has not felt depressed as of late.  Patient reports that she has not been taking her trazodone due to experiencing headaches and nightmares, which she attributes to her trazodone use.  Patient reports that since discontinuing her trazodone, her sleep has been good as of late.  Patient denies any other issues with her mental health but states that she still continues to deal with several health problems related to her diabetes.  Patient also notes that she has been dealing with fluid retention, swelling, and rash.  She believes that the swelling in her legs could be due to standing up all day because of her  job.  A GAD-7 screen was performed with the patient scoring an 18.  Patient is alert and oriented x4, pleasant, calm, cooperative, and fully engaged in conversation during the encounter.  Patient reports that she feels happy but also nervous and tends to work herself into a frenzy due to her nervousness.  Patient denies suicidal or homicidal ideations.  She further denies auditory or visual hallucinations and does not appear to be responding to internal/external stimuli.  Patient endorses good sleep and receives on average 6 to 7 hours of sleep each night.  Patient endorses good appetite and eats on average 2 meals per day.  Patient notes that she has experienced some weight gain but attributes it to fluid retention.  Patient denies alcohol consumption, tobacco use, and illicit drug use.  Visit Diagnosis:    ICD-10-CM   1. Generalized anxiety disorder with panic attacks  F41.1 clonazePAM (KLONOPIN) 1 MG tablet   F41.0 gabapentin (NEURONTIN) 400 MG capsule      Past Psychiatric History:  Insomnia Generalized anxiety disorder Bipolar 2 disorder  Past Medical History:  Past Medical History:  Diagnosis Date   Diabetes mellitus without complication (HCC)    High cholesterol    High triglycerides    Mood disorder (HCC)     Past Surgical History:  Procedure Laterality Date   APPENDECTOMY     CHOLECYSTECTOMY     SHOULDER SURGERY Left    TONSILLECTOMY      Family Psychiatric History:  Aunt (maternal) - schizophrenic Mother - unipolar/severe depression Father -  Alcohol abuse, anxiety, possible bipolar but has not been diagnosed Biological Brother - anxious, paranoid Biological Sister - Anxiety, recovering alcoholic (sober for 4 years)  Family History:  Family History  Problem Relation Age of Onset   Cancer Mother    Heart failure Mother    Cancer Father     Social History:  Social History   Socioeconomic History   Marital status: Divorced    Spouse name: Not on file    Number of children: Not on file   Years of education: Not on file   Highest education level: Not on file  Occupational History   Not on file  Tobacco Use   Smoking status: Never   Smokeless tobacco: Never  Vaping Use   Vaping Use: Never used  Substance and Sexual Activity   Alcohol use: Not Currently   Drug use: Never   Sexual activity: Yes  Other Topics Concern   Not on file  Social History Narrative   Not on file   Social Determinants of Health   Financial Resource Strain: Not on file  Food Insecurity: Not on file  Transportation Needs: Not on file  Physical Activity: Not on file  Stress: Not on file  Social Connections: Not on file    Allergies:  Allergies  Allergen Reactions   Compazine [Prochlorperazine]    Dilaudid [Hydromorphone]     intolerence   Reglan [Metoclopramide]    Zofran [Ondansetron]     Metabolic Disorder Labs: No results found for: HGBA1C, MPG No results found for: PROLACTIN No results found for: CHOL, TRIG, HDL, CHOLHDL, VLDL, LDLCALC Lab Results  Component Value Date   TSH 3.727 03/05/2021    Therapeutic Level Labs: No results found for: LITHIUM No results found for: VALPROATE No components found for:  CBMZ  Current Medications: Current Outpatient Medications  Medication Sig Dispense Refill   ARIPiprazole (ABILIFY) 30 MG tablet Take 1 tablet (30 mg total) by mouth daily. 30 tablet 1   dicyclomine (BENTYL) 20 MG tablet Take 1 tablet (20 mg total) by mouth 2 (two) times daily. 20 tablet 0   escitalopram (LEXAPRO) 20 MG tablet Take 2 tablets (40 mg total) by mouth daily. 60 tablet 1   fenofibrate (TRICOR) 48 MG tablet Take 1 tablet (48 mg total) by mouth daily. 30 tablet 2   furosemide (LASIX) 40 MG tablet Take 1 tablet (40 mg total) by mouth 2 (two) times daily as needed. 15 tablet 0   HYDROcodone-acetaminophen (NORCO/VICODIN) 5-325 MG tablet Take 1 tablet by mouth every 6 (six) hours as needed for severe pain. 10 tablet 0    hydrOXYzine (ATARAX/VISTARIL) 25 MG tablet Take 1 tablet (25 mg total) by mouth 3 (three) times daily as needed for anxiety. 60 tablet 0   insulin aspart (NOVOLOG) 100 UNIT/ML injection Inject into the skin 3 (three) times daily with meals.     insulin detemir (LEVEMIR) 100 UNIT/ML injection Inject 100 Units into the skin daily.     lamoTRIgine (LAMICTAL) 25 MG tablet Take 1 tablet (25 mg total) by mouth daily. 14 tablet 0   lisinopril (PRINIVIL,ZESTRIL) 5 MG tablet Take 5 mg by mouth daily.     metFORMIN (GLUCOPHAGE) 1000 MG tablet Take 0.5 tablets (500 mg total) by mouth 2 (two) times daily with a meal. 60 tablet 1   naproxen (NAPROSYN) 500 MG tablet Take 1 tablet (500 mg total) by mouth 2 (two) times daily. 30 tablet 0   nitroGLYCERIN (NITROSTAT) 0.4 MG SL tablet Place  1 tablet (0.4 mg total) under the tongue every 5 (five) minutes as needed for chest pain. Up to 2 doses 30 tablet 0   pantoprazole (PROTONIX) 20 MG tablet Take 2 tablets (40 mg total) by mouth daily. 60 tablet 0   traZODone (DESYREL) 50 MG tablet Take 1 tablet (50 mg total) by mouth at bedtime as needed for sleep. 30 tablet 1   [START ON 03/31/2021] clonazePAM (KLONOPIN) 1 MG tablet Take 1 tablet (1 mg total) by mouth 2 (two) times daily as needed for anxiety. 60 tablet 0   gabapentin (NEURONTIN) 400 MG capsule Take 1 capsule (400 mg total) by mouth 3 (three) times daily. 90 capsule 1   glimepiride (AMARYL) 2 MG tablet Take 1 tablet (2 mg total) by mouth daily with breakfast. 30 tablet 1   hydrochlorothiazide (HYDRODIURIL) 12.5 MG tablet Take 1 tablet (12.5 mg total) by mouth daily for 5 days. 5 tablet 0   rosuvastatin (CRESTOR) 20 MG tablet Take 1 tablet (20 mg total) by mouth daily for 30 doses. 30 tablet 2   No current facility-administered medications for this visit.     Musculoskeletal: Strength & Muscle Tone: within normal limits Gait & Station: normal Patient leans: N/A  Psychiatric Specialty Exam: Review of Systems   Psychiatric/Behavioral:  Negative for decreased concentration, dysphoric mood, hallucinations, self-injury, sleep disturbance and suicidal ideas. The patient is nervous/anxious. The patient is not hyperactive.    Blood pressure 132/79, pulse 73, height 5\' 5"  (1.651 m), weight 218 lb (98.9 kg), last menstrual period 03/09/2021, SpO2 95 %.Body mass index is 36.28 kg/m.  General Appearance: Well Groomed  Eye Contact:  Good  Speech:  Clear and Coherent and Normal Rate  Volume:  Normal  Mood:  Anxious and Euthymic  Affect:  Appropriate and Congruent  Thought Process:  Coherent, Goal Directed, and Descriptions of Associations: Intact  Orientation:  Full (Time, Place, and Person)  Thought Content: WDL   Suicidal Thoughts:  No  Homicidal Thoughts:  No  Memory:  Immediate;   Good Recent;   Good Remote;   Good  Judgement:  Good  Insight:  Good  Psychomotor Activity:  Normal  Concentration:  Concentration: Good and Attention Span: Good  Recall:  Good  Fund of Knowledge: Good  Language: Good  Akathisia:  NA  Handed:  Right  AIMS (if indicated): not done  Assets:  Communication Skills Desire for Improvement Financial Resources/Insurance Housing Social Support Transportation Vocational/Educational  ADL's:  Intact  Cognition: WNL  Sleep:  Good   Screenings: GAD-7    Flowsheet Row Clinical Support from 03/26/2021 in [facility] Clinical Support from 02/12/2021 in [facility] Clinical Support from 01/30/2021 in [facility] Office Visit from 12/25/2020 in [facility]  Total GAD-7 Score 18 20 19 15       PHQ2-9    Flowsheet Row Clinical Support from 03/26/2021 in [facility] Clinical Support from 02/12/2021 in [facility] Clinical Support from 01/30/2021 in [facility] Office Visit from  12/25/2020 in [facility]  PHQ-2 Total Score 0 0 0 3  PHQ-9 Total Score -- -- -- 13      Flowsheet Row Clinical Support from 03/26/2021 in [facility] ED from 03/23/2021 in [facility] ED from 03/08/2021 in [facility]  C-SSRS RISK CATEGORY Low Risk No Risk No  Risk        Assessment and Plan:   Danielle Barker is a 45 year old female with a past psychiatric history significant for insomnia, generalized anxiety disorder with panic attacks, and bipolar 2 disorder who presents to [facility] for follow-up and medication management.  Patient notes that she continues to have issues with her baseline anxiety.  She reports that Klonopin has been able to knock the edge off her anxiety, however, she is interested in using Neurontin as a means of managing her anxiety.  Patient states that she was recently placed on Neurontin 200 mg 3 times daily for the management of her nerve pain.  Provider to adjust patient's Neurontin from 200 to 400 mg 3 times daily for the management of her anxiety.  Patient is agreeable to recommendation.  Patient's medications to be e-prescribed to pharmacy of choice.  1. Generalized anxiety disorder with panic attacks  - clonazePAM (KLONOPIN) 1 MG tablet; Take 1 tablet (1 mg total) by mouth 2 (two) times daily as needed for anxiety.  Dispense: 60 tablet; Refill: 0 - gabapentin (NEURONTIN) 400 MG capsule; Take 1 capsule (400 mg total) by mouth 3 (three) times daily.  Dispense: 90 capsule; Refill: 1  2. Bipolar 2 disorder (HCC) Patient to continue taking Abilify 30 mg daily for the management of her bipolar 2 disorder Patient to continue taking Lexapro 40 mg daily for the management of her bipolar 2 disorder  3. Insomnia, unspecified type  Patient to follow up in 2 months Provider spent a total of 16 minutes with the  patient/reviewing patient's chart  Meta Hatchet, PA 03/26/2021, 9:57 AM

## 2021-03-27 NOTE — Telephone Encounter (Signed)
Provider was contacted by Orpah Clinton. Reola Calkins, RN regarding patient's concern. Patient was seen a few days ago during her scheduled encounter. Patient is currently taking Gabapentin 400 mg 3 times daily for the management of her anxiety.

## 2021-03-31 ENCOUNTER — Other Ambulatory Visit: Payer: Self-pay

## 2021-03-31 ENCOUNTER — Encounter: Payer: Self-pay | Admitting: Family Medicine

## 2021-03-31 ENCOUNTER — Ambulatory Visit: Payer: Self-pay | Attending: Family Medicine | Admitting: Family Medicine

## 2021-03-31 VITALS — BP 122/73 | HR 77 | Ht 66.0 in | Wt 223.0 lb

## 2021-03-31 DIAGNOSIS — E1169 Type 2 diabetes mellitus with other specified complication: Secondary | ICD-10-CM

## 2021-03-31 DIAGNOSIS — Z1159 Encounter for screening for other viral diseases: Secondary | ICD-10-CM

## 2021-03-31 DIAGNOSIS — Z23 Encounter for immunization: Secondary | ICD-10-CM

## 2021-03-31 DIAGNOSIS — E1142 Type 2 diabetes mellitus with diabetic polyneuropathy: Secondary | ICD-10-CM

## 2021-03-31 DIAGNOSIS — F411 Generalized anxiety disorder: Secondary | ICD-10-CM

## 2021-03-31 DIAGNOSIS — Z1211 Encounter for screening for malignant neoplasm of colon: Secondary | ICD-10-CM

## 2021-03-31 DIAGNOSIS — Z794 Long term (current) use of insulin: Secondary | ICD-10-CM

## 2021-03-31 DIAGNOSIS — F3181 Bipolar II disorder: Secondary | ICD-10-CM

## 2021-03-31 DIAGNOSIS — E785 Hyperlipidemia, unspecified: Secondary | ICD-10-CM

## 2021-03-31 DIAGNOSIS — E119 Type 2 diabetes mellitus without complications: Secondary | ICD-10-CM | POA: Insufficient documentation

## 2021-03-31 DIAGNOSIS — F410 Panic disorder [episodic paroxysmal anxiety] without agoraphobia: Secondary | ICD-10-CM

## 2021-03-31 DIAGNOSIS — R600 Localized edema: Secondary | ICD-10-CM

## 2021-03-31 LAB — POCT GLYCOSYLATED HEMOGLOBIN (HGB A1C): HbA1c, POC (controlled diabetic range): 9.2 % — AB (ref 0.0–7.0)

## 2021-03-31 LAB — GLUCOSE, POCT (MANUAL RESULT ENTRY): POC Glucose: 255 mg/dl — AB (ref 70–99)

## 2021-03-31 MED ORDER — TRUE METRIX BLOOD GLUCOSE TEST VI STRP
ORAL_STRIP | 12 refills | Status: DC
  Filled 2021-03-31 – 2021-08-22 (×2): qty 100, 33d supply, fill #0
  Filled 2021-11-10: qty 100, 33d supply, fill #1
  Filled 2022-03-23: qty 100, 33d supply, fill #2

## 2021-03-31 MED ORDER — NOVOLOG FLEXPEN 100 UNIT/ML ~~LOC~~ SOPN
0.0000 [IU] | PEN_INJECTOR | Freq: Three times a day (TID) | SUBCUTANEOUS | 11 refills | Status: DC
  Filled 2021-03-31: qty 9, 25d supply, fill #0
  Filled 2021-09-04: qty 15, 42d supply, fill #0
  Filled 2021-11-10: qty 15, 42d supply, fill #1

## 2021-03-31 MED ORDER — METFORMIN HCL 1000 MG PO TABS
1000.0000 mg | ORAL_TABLET | Freq: Two times a day (BID) | ORAL | 3 refills | Status: DC
  Filled 2021-03-31: qty 120, 60d supply, fill #0

## 2021-03-31 MED ORDER — INSULIN GLARGINE 100 UNIT/ML ~~LOC~~ SOLN
50.0000 [IU] | Freq: Every day | SUBCUTANEOUS | 11 refills | Status: DC
  Filled 2021-03-31: qty 10, 20d supply, fill #0
  Filled 2021-04-17: qty 10, 20d supply, fill #1
  Filled 2021-05-05: qty 10, 20d supply, fill #0
  Filled 2021-05-26: qty 10, 20d supply, fill #1

## 2021-03-31 MED ORDER — HYDROCHLOROTHIAZIDE 12.5 MG PO TABS
12.5000 mg | ORAL_TABLET | Freq: Every day | ORAL | 1 refills | Status: DC
  Filled 2021-03-31 – 2021-07-02 (×3): qty 30, 30d supply, fill #0

## 2021-03-31 MED ORDER — GABAPENTIN 300 MG PO CAPS
600.0000 mg | ORAL_CAPSULE | Freq: Three times a day (TID) | ORAL | 3 refills | Status: DC
  Filled 2021-03-31 – 2021-05-05 (×2): qty 180, 30d supply, fill #0
  Filled 2021-06-09: qty 180, 30d supply, fill #1
  Filled 2021-07-09: qty 180, 30d supply, fill #2

## 2021-03-31 MED ORDER — TRUEPLUS LANCETS 28G MISC
1.0000 | Freq: Three times a day (TID) | 12 refills | Status: AC
  Filled 2021-03-31 – 2021-11-10 (×2): qty 100, 33d supply, fill #0

## 2021-03-31 MED ORDER — TRUE METRIX METER W/DEVICE KIT
1.0000 | PACK | Freq: Three times a day (TID) | 0 refills | Status: AC
  Filled 2021-03-31: qty 1, 30d supply, fill #0

## 2021-03-31 MED ORDER — TRULICITY 0.75 MG/0.5ML ~~LOC~~ SOAJ
0.7500 mg | SUBCUTANEOUS | 1 refills | Status: DC
Start: 2021-03-31 — End: 2021-04-17
  Filled 2021-03-31: qty 2, 28d supply, fill #0

## 2021-03-31 NOTE — Progress Notes (Signed)
Color discoloration on both legs and feet. Bg [id number] A1C-9.2

## 2021-03-31 NOTE — Progress Notes (Signed)
Subjective:  Patient ID: Danielle Barker, female    DOB: 10/11/1975  Age: 45 y.o. MRN: [id number]  CC: New Patient (Initial Visit)   HPI Danielle Barker is a 45 y.o. year old female with a history of bipolar disorder, generalized anxiety disorder, type 2 diabetes mellitus (A1c 9.2), hyperlipidemia who presents to establish care. She moved here from [facility] earlier in the year.  Interval History: She endorses non compliance with her medication and Diabetic regimen. She has been on Novolin 70/30 and she took 70 units this morning but she adjusts her insuin between 50 - 100 units.  Has been out of glimepiride and has not had her short acting NovoLog.  She complains of numbness and tingling in legs and redness which gets worse after prolonged standing at work. She is on HCTZ for pedal edema.  Bipolar disorder and generalized anxiety and managed by psych. Accompanied by her mother to today's visit. Past Medical History:  Diagnosis Date   Diabetes mellitus without complication (HCC)    High cholesterol    High triglycerides    Mood disorder (HCC)     Past Surgical History:  Procedure Laterality Date   APPENDECTOMY     CHOLECYSTECTOMY     SHOULDER SURGERY Left    TONSILLECTOMY      Family History  Problem Relation Age of Onset   Cancer Mother    Heart failure Mother    Cancer Father     Allergies  Allergen Reactions   Compazine [Prochlorperazine]    Dilaudid [Hydromorphone]     intolerence   Reglan [Metoclopramide]    Zofran [Ondansetron]     Outpatient Medications Prior to Visit  Medication Sig Dispense Refill   ARIPiprazole (ABILIFY) 30 MG tablet Take 1 tablet (30 mg total) by mouth daily. 30 tablet 1   clonazePAM (KLONOPIN) 1 MG tablet Take 1 tablet (1 mg total) by mouth 2 (two) times daily as needed for anxiety. 60 tablet 0   escitalopram (LEXAPRO) 20 MG tablet Take 2 tablets (40 mg total) by mouth daily. 60 tablet 1   fenofibrate (TRICOR) 48 MG tablet Take  1 tablet (48 mg total) by mouth daily. 30 tablet 2   furosemide (LASIX) 40 MG tablet Take 1 tablet (40 mg total) by mouth 2 (two) times daily as needed. 15 tablet 0   gabapentin (NEURONTIN) 400 MG capsule Take 1 capsule (400 mg total) by mouth 3 (three) times daily. 90 capsule 1   insulin aspart (NOVOLOG) 100 UNIT/ML injection Inject into the skin 3 (three) times daily with meals.     insulin detemir (LEVEMIR) 100 UNIT/ML injection Inject 100 Units into the skin daily.     metFORMIN (GLUCOPHAGE) 1000 MG tablet Take 0.5 tablets (500 mg total) by mouth 2 (two) times daily with a meal. 60 tablet 1   naproxen (NAPROSYN) 500 MG tablet Take 1 tablet (500 mg total) by mouth 2 (two) times daily. 30 tablet 0   lisinopril (PRINIVIL,ZESTRIL) 5 MG tablet Take 5 mg by mouth daily.     dicyclomine (BENTYL) 20 MG tablet Take 1 tablet (20 mg total) by mouth 2 (two) times daily. (Patient not taking: Reported on 03/31/2021) 20 tablet 0   glimepiride (AMARYL) 2 MG tablet Take 1 tablet (2 mg total) by mouth daily with breakfast. 30 tablet 1   hydrochlorothiazide (HYDRODIURIL) 12.5 MG tablet Take 1 tablet (12.5 mg total) by mouth daily for 5 days. 5 tablet 0   rosuvastatin (CRESTOR) 20  MG tablet Take 1 tablet (20 mg total) by mouth daily for 30 doses. 30 tablet 2   HYDROcodone-acetaminophen (NORCO/VICODIN) 5-325 MG tablet Take 1 tablet by mouth every 6 (six) hours as needed for severe pain. (Patient not taking: Reported on 03/31/2021) 10 tablet 0   hydrOXYzine (ATARAX/VISTARIL) 25 MG tablet Take 1 tablet (25 mg total) by mouth 3 (three) times daily as needed for anxiety. (Patient not taking: Reported on 03/31/2021) 60 tablet 0   lamoTRIgine (LAMICTAL) 25 MG tablet Take 1 tablet (25 mg total) by mouth daily. (Patient not taking: Reported on 03/31/2021) 14 tablet 0   nitroGLYCERIN (NITROSTAT) 0.4 MG SL tablet Place 1 tablet (0.4 mg total) under the tongue every 5 (five) minutes as needed for chest pain. Up to 2 doses (Patient  not taking: Reported on 03/31/2021) 30 tablet 0   pantoprazole (PROTONIX) 20 MG tablet Take 2 tablets (40 mg total) by mouth daily. (Patient not taking: Reported on 03/31/2021) 60 tablet 0   traZODone (DESYREL) 50 MG tablet Take 1 tablet (50 mg total) by mouth at bedtime as needed for sleep. (Patient not taking: Reported on 03/31/2021) 30 tablet 1   No facility-administered medications prior to visit.     ROS Review of Systems  Constitutional:  Negative for activity change, appetite change and fatigue.  HENT:  Negative for congestion, sinus pressure and sore throat.   Eyes:  Negative for visual disturbance.  Respiratory:  Negative for cough, chest tightness, shortness of breath and wheezing.   Cardiovascular:  Positive for leg swelling. Negative for chest pain and palpitations.  Gastrointestinal:  Negative for abdominal distention, abdominal pain and constipation.  Endocrine: Negative for polydipsia.  Genitourinary:  Negative for dysuria and frequency.  Musculoskeletal:  Negative for arthralgias and back pain.  Skin:  Negative for rash.  Neurological:  Positive for numbness. Negative for tremors and light-headedness.  Hematological:  Does not bruise/bleed easily.  Psychiatric/Behavioral:  Negative for agitation and behavioral problems.    Objective:  BP 122/73   Pulse 77   Ht _0  (1.676 m)   Wt 223 lb (101.2 kg)   LMP 03/09/2021   BMI 35.99 kg/m   BP/Weight 03/31/2021 03/23/2021 96/07/5407  Systolic BP 811 914 782  Diastolic BP 73 87 78  Wt. (Lbs) 223 218 216  BMI 35.99 36.28 35.94  Some encounter information is confidential and restricted. Go to Review Flowsheets activity to see all data.      Physical Exam Constitutional:      Appearance: She is well-developed.  Cardiovascular:     Rate and Rhythm: Normal rate.     Heart sounds: Normal heart sounds. No murmur heard. Pulmonary:     Effort: Pulmonary effort is normal.     Breath sounds: Normal breath sounds. No  wheezing or rales.  Chest:     Chest wall: No tenderness.  Abdominal:     General: Bowel sounds are normal. There is no distension.     Palpations: Abdomen is soft. There is no mass.     Tenderness: There is no abdominal tenderness.  Musculoskeletal:        General: Normal range of motion.     Right lower leg: Edema (1+ ankle non pitting) present.     Left lower leg: Edema (1+ non pitting) present.  Neurological:     Mental Status: She is alert and oriented to person, place, and time.  Psychiatric:        Mood and Affect: Mood normal.  Diabetic Foot Exam - Simple   Simple Foot Form Diabetic Foot exam was performed with the following findings: Yes 03/31/2021  9:44 AM  Visual Inspection See comments: Yes Sensation Testing Intact to touch and monofilament testing bilaterally: Yes Pulse Check Posterior Tibialis and Dorsalis pulse intact bilaterally: Yes Comments Edema of both feet more pronounced on bilateral ankles. Medial aspect of left foot with callus.  No ulcerations or skin breakdown.     CMP Latest Ref Rng & Units 03/05/2021 03/04/2021 02/16/2021  Glucose 70 - 99 mg/dL 205(H) 172(H) 318(H)  BUN 6 - 20 mg/dL _0 Creatinine 0.44 - 1.00 mg/dL 0.65 0.76 0.78  Sodium 135 - 145 mmol/L 135 137 132(L)  Potassium 3.5 - 5.1 mmol/L 3.9 3.9 4.1  Chloride 98 - 111 mmol/L 100 103 102  CO2 22 - 32 mmol/L _1 Calcium 8.9 - 10.3 mg/dL 9.2 9.1 9.1  Total Protein 6.5 - 8.1 g/dL 6.7 - 6.7  Total Bilirubin 0.3 - 1.2 mg/dL 0.4 - 0.3  Alkaline Phos 38 - 126 U/L 64 - 96  AST 15 - 41 U/L 13(L) - 18  ALT 0 - 44 U/L 18 - 21    Lipid Panel  No results found for: CHOL, TRIG, HDL, CHOLHDL, VLDL, LDLCALC, LDLDIRECT  CBC    Component Value Date/Time   WBC 7.6 03/05/2021 1650   RBC 3.96 03/05/2021 1650   HGB 12.2 03/05/2021 1650   HCT 35.9 (L) 03/05/2021 1650   PLT 333 03/05/2021 1650   MCV 90.7 03/05/2021 1650   MCH 30.8 03/05/2021 1650   MCHC 34.0 03/05/2021 1650   RDW 13.0  03/05/2021 1650   LYMPHSABS 2.0 03/05/2021 1650   MONOABS 0.6 03/05/2021 1650   EOSABS 0.1 03/05/2021 1650   BASOSABS 0.0 03/05/2021 1650    Lab Results  Component Value Date   HGBA1C 9.2 (A) 03/31/2021    Assessment & Plan:  1. Type 2 diabetes mellitus with other specified complication, with long-term current use of insulin (HCC) Uncontrolled with A1c of 9.2; goal is less than 7.0 Discontinue glimepiride Trulicity added to regimen Metformin dose increased from 500 mg twice daily to 1000 mg twice daily Will switch from Novolin 70/30 to Lantus She will follow-up with the clinical pharmacist to review her blood sugar log at her next visit.  She will need up titration of her GLP-1 agonist in 1 month to maximize benefit Counseled on Diabetic diet, my plate method, 222 minutes of moderate intensity exercise/week Blood sugar logs with fasting goals of 80-120 mg/dl, random of less than 180 and in the event of sugars less than 60 mg/dl or greater than 400 mg/dl encouraged to notify the clinic. Advised on the need for annual eye exams, annual foot exams, Pneumonia vaccine. - POCT glucose (manual entry) - POCT glycosylated hemoglobin (Hb A1C) - Dulaglutide (TRULICITY) 9.79 GX/2.1JH SOPN; Inject 0.75 mg into the skin once a week.  Dispense: 2 mL; Refill: 1 - insulin glargine (LANTUS) 100 UNIT/ML injection; Inject 0.5 mLs (50 Units total) into the skin daily.  Dispense: 10 mL; Refill: 11 - metFORMIN (GLUCOPHAGE) 1000 MG tablet; Take 1 tablet (1,000 mg total) by mouth 2 (two) times daily with a meal.  Dispense: 120 tablet; Refill: 3 - insulin aspart (NOVOLOG FLEXPEN) 100 UNIT/ML FlexPen; Inject 0-12 Units into the skin 3 (three) times daily with meals. As per sliding scale  Dispense: 15 mL; Refill: 11 - glucose blood (TRUE METRIX BLOOD GLUCOSE TEST) test strip;  Use 1 test strip to check blood glucose 3 times daily  Dispense: 100 each; Refill: 12 - Blood Glucose Monitoring Suppl (TRUE METRIX METER)  w/Device KIT; use kit to check blood glucose 3 three times daily before meals.  Dispense: 1 kit; Refill: 0 - TRUEplus Lancets 28G MISC; use 1 lancet to check blood glucose 3 times daily before meals.  Dispense: 100 each; Refill: 12  2. Diabetic polyneuropathy associated with type 2 diabetes mellitus (HCC) Uncontrolled Gabapentin dose has been increased Advised that glycemic control will bring about improvement  3. Screening for viral disease - HCV Ab w Reflex to Quant PCR - HIV Antibody (routine testing w rflx)  4. Screening for colon cancer - Fecal occult blood, imunochemical  5. Bipolar 2 disorder (HCC) Continue Abilify and other psychotropic medications per mental health  6. Generalized anxiety disorder with panic attacks As per mental health - gabapentin (NEURONTIN) 300 MG capsule; Take 2 capsules (600 mg total) by mouth 3 (three) times daily.  Dispense: 180 capsule; Refill: 3  7. Hyperlipidemia associated with type 2 diabetes mellitus ([place]) Currently on Crestor Low-cholesterol diet Will check lipid panel at next visit  8. Pedal edema Dependent edema Use compression stockings Refill HCTZ - hydrochlorothiazide (HYDRODIURIL) 12.5 MG tablet; Take 1 tablet (12.5 mg total) by mouth daily for 5 days.  Dispense: 30 tablet; Refill: 1  9. Need for immunization against influenza - Flu Vaccine QUAD 24moIM (Fluarix, Fluzone & Alfiuria Quad PF)    No orders of the defined types were placed in this encounter.   Return in about 2 weeks (around 04/14/2021) for Blood sugar evaluation with LLurena Joiner 3 months with PCP-diabetes.       ECharlott Rakes MD, FAAFP. [facility]ur[facility] LP[facility] [place]  03/31/2021, 9:25 AM

## 2021-03-31 NOTE — Patient Instructions (Signed)
Dulaglutide Injection What is this medication? DULAGLUTIDE (DOO la GLOO tide) treats type 2 diabetes. It works by increasing insulin levels in your body, which decreases your blood sugar (glucose). It also reduces the amount of sugar released into your blood and slows down your digestion. It can also be used to lower the risk of heart attack and stroke in people with type 2 diabetes. Changes to diet and exercise are often combined with this medication. This medicine may be used for other purposes; ask your health care provider or pharmacist if you have questions. COMMON BRAND NAME(S): Trulicity What should I tell my care team before I take this medication? They need to know if you have any of these conditions: Endocrine tumors (MEN 2) or if someone in your family had these tumors Eye disease, vision problems History of pancreatitis Kidney disease Liver disease Stomach or intestine problems Thyroid cancer or if someone in your family had thyroid cancer An unusual or allergic reaction to dulaglutide, other medications, foods, dyes, or preservatives Pregnant or trying to get pregnant Breast-feeding How should I use this medication? This medication is injected under the skin. You will be taught how to prepare and give it. Take it as directed on the prescription label on the same day of each week. Do NOT prime the pen. Keep taking it unless your care team tells you to stop. If you use this medication with insulin, you should inject this medication and the insulin separately. Do not mix them together. Do not give the injections right next to each other. Change (rotate) injection sites with each injection. This medication comes with INSTRUCTIONS FOR USE. Ask your pharmacist for directions on how to use this medication. Read the information carefully. Talk to your pharmacist or care team if you have questions. It is important that you put your used needles and syringes in a special sharps container. Do  not put them in a trash can. If you do not have a sharps container, call your pharmacist or care team to get one. A special MedGuide will be given to you by the pharmacist with each prescription and refill. Be sure to read this information carefully each time. Talk to your care team about the use of this medication in children. Special care may be needed. Overdosage: If you think you have taken too much of this medicine contact a poison control center or emergency room at once. NOTE: This medicine is only for you. Do not share this medicine with others. What if I miss a dose? If you miss a dose, take it as soon as you can unless it is more than 3 days late. If it is more than 3 days late, skip the missed dose. Take the next dose at the normal time. What may interact with this medication? Other medications for diabetes Many medications may cause changes in blood sugar, these include: Alcohol containing beverages Antiviral medications for HIV or AIDS Aspirin and aspirin-like medications Certain medications for blood pressure, heart disease, irregular heart beat Chromium Diuretics Female hormones, such as estrogens or progestins, birth control pills Fenofibrate Gemfibrozil Isoniazid Lanreotide Female hormones or anabolic steroids MAOIs like Carbex, Eldepryl, Marplan, Nardil, and Parnate Medications for allergies, asthma, cold, or cough Medications for depression, anxiety, or psychotic disturbances Medications for weight loss Niacin Nicotine NSAIDs, medications for pain and inflammation, like ibuprofen or naproxen Octreotide Pasireotide Pentamidine Phenytoin Probenecid Quinolone antibiotics such as ciprofloxacin, levofloxacin, ofloxacin Some herbal dietary supplements Steroid medications such as prednisone or cortisone Sulfamethoxazole;   trimethoprim Thyroid hormones Some medications can hide the warning symptoms of low blood sugar (hypoglycemia). You may need to monitor your blood  sugar more closely if you are taking one of these medications. These include: Beta-blockers, often used for high blood pressure or heart problems (examples include atenolol, metoprolol, propranolol) Clonidine Guanethidine Reserpine This list may not describe all possible interactions. Give your health care provider a list of all the medicines, herbs, non-prescription drugs, or dietary supplements you use. Also tell them if you smoke, drink alcohol, or use illegal drugs. Some items may interact with your medicine. What should I watch for while using this medication? Visit your care team for regular checks on your progress. Check with your care team if you have severe diarrhea, nausea, and vomiting, or if you sweat a lot. The loss of too much body fluid may make it dangerous for you to take this medication. A test called the HbA1C (A1C) will be monitored. This is a simple blood test. It measures your blood sugar control over the last 2 to 3 months. You will receive this test every 3 to 6 months. Learn how to check your blood sugar. Learn the symptoms of low and high blood sugar and how to manage them. Always carry a quick-source of sugar with you in case you have symptoms of low blood sugar. Examples include hard sugar candy or glucose tablets. Make sure others know that you can choke if you eat or drink when you develop serious symptoms of low blood sugar, such as seizures or unconsciousness. Get medical help at once. Tell your care team if you have high blood sugar. You might need to change the dose of your medication. If you are sick or exercising more than usual, you may need to change the dose of your medication. Do not skip meals. Ask your care team if you should avoid alcohol. Many nonprescription cough and cold products contain sugar or alcohol. These can affect blood sugar. Pens should never be shared. Even if the needle is changed, sharing may result in passing of viruses like hepatitis or  HIV. Wear a medical ID bracelet or chain. Carry a card that describes your condition. List the medications and doses you take on the card. What side effects may I notice from receiving this medication? Side effects that you should report to your care team as soon as possible: Allergic reactions--skin rash, itching, hives, swelling of the face, lips, tongue, or throat Change in vision Dehydration--increased thirst, dry mouth, feeling faint or lightheaded, headache, dark yellow or brown urine Kidney injury--decrease in the amount of urine, swelling of the ankles, hands, or feet Pancreatitis--severe stomach pain that spreads to your back or gets worse after eating or when touched, fever, nausea, vomiting Thyroid cancer--new mass or lump in the neck, pain or trouble swallowing, trouble breathing, hoarseness Side effects that usually do not require medical attention (report to your care team if they continue or are bothersome): Diarrhea Loss of appetite Nausea Stomach pain Vomiting This list may not describe all possible side effects. Call your doctor for medical advice about side effects. You may report side effects to FDA at 1-800-FDA-1088. Where should I keep my medication? Keep out of the reach of children and pets. Refrigeration (preferred): Store unopened pens in a refrigerator between 2 and 8 degrees C (36 and 46 degrees F). Keep it in the original carton until you are ready to take it. Do not freeze or use if the medication has been frozen.   Protect from light. Get rid of any unused medication after the expiration date on the label. Room Temperature: The pen may be stored at room temperature below 30 degrees C (86 degrees F) for up to a total of 14 days if needed. Protect from light. Avoid exposure to extreme heat. If it is stored at room temperature, throw away any unused medication after 14 days or after it expires, whichever is first. To get rid of medications that are no longer needed or  have expired: Take the medication to a medication take-back program. Check with your pharmacy or law enforcement to find a location. If you cannot return the medication, ask your pharmacist or care team how to get rid of this medication safely. NOTE: This sheet is a summary. It may not cover all possible information. If you have questions about this medicine, talk to your doctor, pharmacist, or health care provider.  2022 Elsevier/Gold Standard (2020-07-18 00:00:00)  

## 2021-04-01 ENCOUNTER — Other Ambulatory Visit: Payer: Self-pay

## 2021-04-17 ENCOUNTER — Other Ambulatory Visit: Payer: Self-pay

## 2021-04-17 ENCOUNTER — Emergency Department (HOSPITAL_BASED_OUTPATIENT_CLINIC_OR_DEPARTMENT_OTHER)
Admission: EM | Admit: 2021-04-17 | Discharge: 2021-04-17 | Disposition: A | Discharge status: Home / Self Care | Payer: Self-pay | Attending: Emergency Medicine | Admitting: Emergency Medicine

## 2021-04-17 ENCOUNTER — Encounter (HOSPITAL_BASED_OUTPATIENT_CLINIC_OR_DEPARTMENT_OTHER): Payer: Self-pay | Admitting: Emergency Medicine

## 2021-04-17 ENCOUNTER — Ambulatory Visit: Payer: Self-pay | Attending: Family Medicine | Admitting: Pharmacist

## 2021-04-17 ENCOUNTER — Emergency Department (HOSPITAL_BASED_OUTPATIENT_CLINIC_OR_DEPARTMENT_OTHER): Payer: Self-pay | Admitting: Radiology

## 2021-04-17 ENCOUNTER — Encounter: Payer: Self-pay | Admitting: Pharmacist

## 2021-04-17 DIAGNOSIS — Z79899 Other long term (current) drug therapy: Secondary | ICD-10-CM | POA: Insufficient documentation

## 2021-04-17 DIAGNOSIS — Z794 Long term (current) use of insulin: Secondary | ICD-10-CM

## 2021-04-17 DIAGNOSIS — Z20822 Contact with and (suspected) exposure to covid-19: Secondary | ICD-10-CM | POA: Insufficient documentation

## 2021-04-17 DIAGNOSIS — J029 Acute pharyngitis, unspecified: Secondary | ICD-10-CM

## 2021-04-17 DIAGNOSIS — E119 Type 2 diabetes mellitus without complications: Secondary | ICD-10-CM | POA: Insufficient documentation

## 2021-04-17 DIAGNOSIS — Z7984 Long term (current) use of oral hypoglycemic drugs: Secondary | ICD-10-CM | POA: Insufficient documentation

## 2021-04-17 DIAGNOSIS — E1169 Type 2 diabetes mellitus with other specified complication: Secondary | ICD-10-CM

## 2021-04-17 DIAGNOSIS — J00 Acute nasopharyngitis [common cold]: Secondary | ICD-10-CM

## 2021-04-17 DIAGNOSIS — R059 Cough, unspecified: Secondary | ICD-10-CM

## 2021-04-17 LAB — RESP PANEL BY RT-PCR (FLU A&B, COVID) ARPGX2
Influenza A by PCR: NEGATIVE
Influenza B by PCR: NEGATIVE
SARS Coronavirus 2 by RT PCR: NEGATIVE

## 2021-04-17 MED ORDER — ROSUVASTATIN CALCIUM 20 MG PO TABS
20.0000 mg | ORAL_TABLET | Freq: Every day | ORAL | 2 refills | Status: DC
  Filled 2021-04-17 – 2021-07-02 (×2): qty 30, 30d supply, fill #0
  Filled 2021-11-10 – 2021-12-29 (×4): qty 30, 30d supply, fill #1

## 2021-04-17 MED ORDER — LIDOCAINE VISCOUS HCL 2 % MT SOLN
10.0000 mL | Freq: Once | OROMUCOSAL | Status: AC
  Administered 2021-04-17: 05:00:00 10 mL via OROMUCOSAL
  Filled 2021-04-17: qty 15

## 2021-04-17 MED ORDER — TRULICITY 1.5 MG/0.5ML ~~LOC~~ SOAJ
1.5000 mg | SUBCUTANEOUS | 2 refills | Status: DC
  Filled 2021-04-17 – 2021-05-26 (×2): qty 2, 28d supply, fill #0

## 2021-04-17 MED ORDER — FENOFIBRATE 48 MG PO TABS
48.0000 mg | ORAL_TABLET | Freq: Every day | ORAL | 2 refills | Status: DC
Start: 2021-04-17 — End: 2022-01-01
  Filled 2021-04-17 – 2021-07-02 (×2): qty 30, 30d supply, fill #0
  Filled 2021-11-10: qty 30, 30d supply, fill #1
  Filled 2021-12-29: qty 30, 30d supply, fill #2

## 2021-04-17 NOTE — ED Provider Notes (Signed)
[facility] EMERGENCY DEPT Provider Note  CSN: [id number] Arrival date & time: 04/17/21 0441  Chief Complaint(s) Cough and Sore Throat  HPI Danielle Barker is a 45 y.o. female    Cough Cough characteristics:  Hacking Sputum characteristics:  Clear Severity:  Moderate Onset quality:  Gradual Duration:  2 days Timing:  Intermittent Progression:  Waxing and waning Chronicity:  New Context: upper respiratory infection   Relieved by:  Nothing Worsened by:  Nothing Associated symptoms: rhinorrhea and sore throat   Associated symptoms: no chills, no fever and no shortness of breath   Sore Throat Pertinent negatives include no shortness of breath.   Past Medical History Past Medical History:  Diagnosis Date   Diabetes mellitus without complication ([place])    High cholesterol    High triglycerides    Mood disorder [facility])    Patient Active Problem List   Diagnosis Date Noted   Diabetes mellitus without complication ([place])    Insomnia 02/12/2021   Bipolar 2 disorder ([place]) 12/25/2020   Generalized anxiety disorder with panic attacks 12/25/2020   Home Medication(s) Prior to Admission medications   Medication Sig Start Date End Date Taking? Authorizing Provider  ARIPiprazole (ABILIFY) 30 MG tablet Take 1 tablet (30 mg total) by mouth daily. 01/30/21   Nwoko, Terese Door, PA  Blood Glucose Monitoring Suppl (TRUE METRIX METER) w/Device KIT use kit to check blood glucose 3 three times daily before meals. 03/31/21   Charlott Rakes, MD  clonazePAM (KLONOPIN) 1 MG tablet Take 1 tablet (1 mg total) by mouth 2 (two) times daily as needed for anxiety. 03/31/21 03/31/22  Nwoko, Terese Door, PA  dicyclomine (BENTYL) 20 MG tablet Take 1 tablet (20 mg total) by mouth 2 (two) times daily. Patient not taking: Reported on 03/31/2021 02/28/21   Hazel Sams, PA-C  Dulaglutide (TRULICITY) 3.24 MW/1.0UV SOPN Inject 0.75 mg into the skin once a week. 03/31/21   Charlott Rakes, MD   escitalopram (LEXAPRO) 20 MG tablet Take 2 tablets (40 mg total) by mouth daily. 01/30/21   Nwoko, Terese Door, PA  fenofibrate (TRICOR) 48 MG tablet Take 1 tablet (48 mg total) by mouth daily. 03/05/21 04/04/21  Gareth Morgan, MD  furosemide (LASIX) 40 MG tablet Take 1 tablet (40 mg total) by mouth 2 (two) times daily as needed. 03/23/21   Blanchie Dessert, MD  gabapentin (NEURONTIN) 300 MG capsule Take 2 capsules (600 mg total) by mouth 3 (three) times daily. 03/31/21 04/30/21  Charlott Rakes, MD  glucose blood (TRUE METRIX BLOOD GLUCOSE TEST) test strip Use 1 test strip to check blood glucose 3 times daily 03/31/21   Charlott Rakes, MD  hydrochlorothiazide (HYDRODIURIL) 12.5 MG tablet Take 1 tablet (12.5 mg total) by mouth daily for 5 days. 03/31/21 04/30/21  Charlott Rakes, MD  insulin aspart (NOVOLOG FLEXPEN) 100 UNIT/ML FlexPen Inject 0-12 Units into the skin 3 (three) times daily with meals. As per sliding scale 03/31/21   Charlott Rakes, MD  insulin glargine (LANTUS) 100 UNIT/ML injection Inject 0.5 mLs (50 Units total) into the skin daily. 03/31/21   Charlott Rakes, MD  metFORMIN (GLUCOPHAGE) 1000 MG tablet Take 1 tablet (1,000 mg total) by mouth 2 (two) times daily with a meal. 03/31/21   Charlott Rakes, MD  naproxen (NAPROSYN) 500 MG tablet Take 1 tablet (500 mg total) by mouth 2 (two) times daily. 09/18/20   Maudie Flakes, MD  rosuvastatin (CRESTOR) 20 MG tablet Take 1 tablet (20 mg total) by mouth daily for  30 doses. 09/12/20 10/12/20  Wyvonnia Dusky, MD  TRUEplus Lancets 28G MISC use 1 lancet to check blood glucose 3 times daily before meals. 03/31/21   Charlott Rakes, MD  citalopram (CELEXA) 40 MG tablet Take 40 mg by mouth daily.  11/25/20  [provider]                                                                                                                                    Past Surgical History Past Surgical History:  Procedure Laterality Date   APPENDECTOMY      CHOLECYSTECTOMY     SHOULDER SURGERY Left    TONSILLECTOMY     Family History Family History  Problem Relation Age of Onset   Cancer Mother    Heart failure Mother    Cancer Father     Social History Social History   Tobacco Use   Smoking status: Never   Smokeless tobacco: Never  Vaping Use   Vaping Use: Never used  Substance Use Topics   Alcohol use: Not Currently   Drug use: Never   Allergies Compazine [prochlorperazine], Dilaudid [hydromorphone], Reglan [metoclopramide], and Zofran [ondansetron]  Review of Systems Review of Systems  Constitutional:  Negative for chills and fever.  HENT:  Positive for rhinorrhea and sore throat.   Respiratory:  Positive for cough. Negative for shortness of breath.   All other systems are reviewed and are negative for acute change except as noted in the HPI  Physical Exam Vital Signs  I have reviewed the triage vital signs BP 125/83 (BP Location: Right Arm)    Pulse 95    Temp 99 F (37.2 C) (Oral)    Resp 20    Ht _0  (1.676 m)    Wt 97.5 kg    SpO2 97%    BMI 34.70 kg/m   Physical Exam Vitals reviewed.  Constitutional:      General: She is not in acute distress.    Appearance: She is well-developed. She is not diaphoretic.  HENT:     Head: Normocephalic and atraumatic.     Right Ear: Tympanic membrane normal.     Left Ear: Tympanic membrane normal.     Nose: Nose normal.     Mouth/Throat:     Lips: No lesions.     Mouth: No oral lesions or angioedema.     Pharynx: No pharyngeal swelling, oropharyngeal exudate, posterior oropharyngeal erythema or uvula swelling.     Tonsils: No tonsillar exudate or tonsillar abscesses.     Comments: Post nasal drip  Eyes:     General: No scleral icterus.       Right eye: No discharge.        Left eye: No discharge.     Conjunctiva/sclera: Conjunctivae normal.     Pupils: Pupils are equal, round, and reactive to light.  Cardiovascular:     Rate and Rhythm: Normal  rate and regular  rhythm.     Heart sounds: No murmur heard.   No friction rub. No gallop.  Pulmonary:     Effort: Pulmonary effort is normal. No respiratory distress.     Breath sounds: Normal breath sounds. No stridor. No rales.  Abdominal:     General: There is no distension.     Palpations: Abdomen is soft.     Tenderness: There is no abdominal tenderness.  Musculoskeletal:        General: No tenderness.     Cervical back: Normal range of motion and neck supple.  Skin:    General: Skin is warm and dry.     Findings: No erythema or rash.  Neurological:     Mental Status: She is alert and oriented to person, place, and time.    ED Results and Treatments Labs (all labs ordered are listed, but only abnormal results are displayed) Labs Reviewed  RESP PANEL BY RT-PCR (FLU A&B, COVID) [id number]                                                                                                                         EKG  EKG Interpretation  Date/Time:    Ventricular Rate:    PR Interval:    QRS Duration:   QT Interval:    QTC Calculation:   R Axis:     Text Interpretation:         Radiology No results found.  Pertinent labs & imaging results that were available during my care of the patient were reviewed by me and considered in my medical decision making (see MDM for details).  Medications Ordered in ED Medications  lidocaine (XYLOCAINE) 2 % viscous mouth solution 10 mL (10 mLs Mouth/Throat Given 04/17/21 0526)                                                                                                                                     Procedures Procedures  (including critical care time)  Medical Decision Making / ED Course I have reviewed the nursing notes for this encounter and the patient's prior records (if available in EHR or on provided paperwork).  Danielle Barker was evaluated in Emergency Department on 04/17/2021 for the symptoms described in the history of present  illness. She was evaluated in the context of the global COVID-19 pandemic, which necessitated consideration that the  patient might be at risk for infection with the SARS-CoV-2 virus that causes COVID-19. Institutional protocols and algorithms that pertain to the evaluation of patients at risk for COVID-19 are in a state of rapid change based on information released by regulatory bodies including the CDC and federal and state organizations. These policies and algorithms were followed during the patient's care in the ED.     Patient presents with viral symptoms for 2-3 days. adequate oral hydration. Rest of history as above.  Patient appears well. No signs of toxicity, patient is interactive. No hypoxia, tachypnea or other signs of respiratory distress. No sign of clinical dehydration. Lung exam clear. Rest of exam as above.  Most consistent with viral illness COVID/Flu negative.  Chest x-ray offered but declined by patient No evidence suggestive of pharyngitis, AOM,or meningitis.  Discussed symptomatic treatment with the patient and they will follow closely with their PCP.   Pertinent labs & imaging results that were available during my care of the patient were reviewed by me and considered in my medical decision making:    Final Clinical Impression(s) / ED Diagnoses Final diagnoses:  Cough  Sore throat  Acute nasopharyngitis   The patient appears reasonably screened and/or stabilized for discharge and I doubt any other medical condition or other [facility] requiring further screening, evaluation, or treatment in the ED at this time prior to discharge. Safe for discharge with strict return precautions.  Disposition: Discharge  Condition: Good  I have discussed the results, Dx and Tx plan with the patient/family who expressed understanding and agree(s) with the plan. Discharge instructions discussed at length. The patient/family was given strict return precautions who verbalized understanding  of the instructions. No further questions at time of discharge.    ED Discharge Orders     None       Follow Up: Charlott Rakes, MD [place] [address] [phone]  Call  to schedule an appointment for close follow up     This chart was dictated using voice recognition software.  Despite best efforts to proofread,  errors can occur which can change the documentation meaning.    Fatima Blank, MD 04/17/21 [phone]

## 2021-04-17 NOTE — Progress Notes (Signed)
° ° °  S:    PCP: Dr. Alvis Lemmings   No chief complaint on file.  Patient arrives in good spirits.  Presents for diabetes evaluation, education, and management. Patient was referred and last seen by Primary Care Provider on 03/31/2021. A1c was 9.2% at that visit. Pt was started on Trulicity. Her 70/30 was changed to Lantus.   Patient reports Diabetes was diagnosed in 2005. She tells me she was started on insulin around 7 years ago. She is tolerating the Trulicity well. Denies any NV, abdominal pain. No changes in vision. She has adjusted her Lantus to 70 units daily and feels that this controls her glucose at home better. In regards to her medical hx, she denies any hx of CHF or ACS. No stroke or CKD. Denies any thyroid cancer or pancreatitis.    Family/Social History:  -Fhx: HF (mother), MI (maternal uncle, maternal aunt, MGM), stroke (maternal aunt) -Tobacco: never smoker  -Alcohol: none reported   Insurance coverage/medication affordability: none. Uses patient assistance here.   Medication adherence reported.   Current diabetes medications include: metformin 1000 mg BID, Lantus 70 units daily, Trulicity 0.75 mg weekly, Novolog sliding scale  Current hypertension medications include: HCTZ 12.5 mg daily  Current hyperlipidemia medications include: rosuvastatin 20 mg daily, fenofibrate 48 mg daily   Patient reports rare hypoglycemic events that only occur if she has prolonged periods between meals.   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 Walks.    Patient denies nocturia (nighttime urination).  Patient denies neuropathy (nerve pain). Patient denies visual changes. Patient reports self foot exams.     O:  Physical  Exam  ROS  Lab Results  Component Value Date   HGBA1C 9.2 (A) 03/31/2021   There were no vitals filed for this visit.  Lipid Panel  No results found for: CHOL, TRIG, HDL, CHOLHDL, VLDL, LDLCALC, LDLDIRECT  Home fasting blood sugars: 100s-180s  2 hour post-meal/random blood sugars: mostly in the 230s.  Clinical Atherosclerotic Cardiovascular Disease (ASCVD): No  The ASCVD Risk score (Arnett DK, et al., 2019) failed to calculate for the following reasons:   Cannot find a previous HDL lab   Cannot find a previous total cholesterol lab   A/P: Diabetes longstanding currently uncontrolled. Patient is able to verbalize appropriate hypoglycemia management plan. Medication adherence appears appropriate. -Increased dose of Trulicity to 1.5 mg weekly.  -Continue metformin, Lantus, and Novolog at current doses.  -Precaution given to hold Novolog if pre-meal CBG is <150.  -Extensively discussed pathophysiology of diabetes, recommended lifestyle interventions, dietary effects on blood sugar control -Counseled on s/sx of and management of hypoglycemia -Next A1C anticipated 06/2021.   Written patient instructions provided.  Total time in face to face counseling 30 minutes.   Follow up Pharmacist Clinic Visit in 1 month.     Butch Penny, PharmD, [place]y [place], CPP Clinical Pharmacist [facility] [phone]

## 2021-04-17 NOTE — ED Triage Notes (Signed)
°  Patient comes in with cough/sore throat that has been going on since 12/20.  Patient states throat was scratchy on 12/20 and developed a productive cough on 12/21.  Low grade fevers at home.  Taking motrin 800 mg and zinc, last dose motrin 0330.  Patient also endorsing headache and bilateral ear pain.  Pain 8/10, throat.  Took at home covid test earlier and it was negative.

## 2021-04-17 NOTE — Discharge Instructions (Addendum)
You may take over-the-counter medicine for symptomatic relief, such as Tylenol, Motrin, TheraFlu, Alka seltzer , black elderberry, etc. Please limit acetaminophen (Tylenol) to 4000 mg and Ibuprofen (Motrin, Advil, etc.) to 2400 mg for a 24hr period. Please note that other over-the-counter medicine may contain acetaminophen or ibuprofen as a component of their ingredients.   

## 2021-04-18 ENCOUNTER — Other Ambulatory Visit: Payer: Self-pay

## 2021-04-24 ENCOUNTER — Other Ambulatory Visit: Payer: Self-pay

## 2021-04-29 ENCOUNTER — Emergency Department (HOSPITAL_BASED_OUTPATIENT_CLINIC_OR_DEPARTMENT_OTHER): Payer: Self-pay | Admitting: Radiology

## 2021-04-29 ENCOUNTER — Encounter (HOSPITAL_BASED_OUTPATIENT_CLINIC_OR_DEPARTMENT_OTHER): Payer: Self-pay

## 2021-04-29 ENCOUNTER — Other Ambulatory Visit: Payer: Self-pay

## 2021-04-29 ENCOUNTER — Emergency Department (HOSPITAL_BASED_OUTPATIENT_CLINIC_OR_DEPARTMENT_OTHER)
Admission: EM | Admit: 2021-04-29 | Discharge: 2021-04-29 | Discharge status: Against Medical Advice | Payer: Self-pay | Attending: Emergency Medicine | Admitting: Emergency Medicine

## 2021-04-29 DIAGNOSIS — R7309 Other abnormal glucose: Secondary | ICD-10-CM | POA: Insufficient documentation

## 2021-04-29 DIAGNOSIS — R059 Cough, unspecified: Secondary | ICD-10-CM | POA: Insufficient documentation

## 2021-04-29 DIAGNOSIS — Z5321 Procedure and treatment not carried out due to patient leaving prior to being seen by health care provider: Secondary | ICD-10-CM | POA: Insufficient documentation

## 2021-04-29 LAB — CBG MONITORING, ED: Glucose-Capillary: 203 mg/dL — ABNORMAL HIGH (ref 70–99)

## 2021-04-29 NOTE — ED Notes (Signed)
Patient refused EKG at this Time.

## 2021-04-29 NOTE — ED Triage Notes (Signed)
Patient here POV from Home with Flu-Like Symptoms.  Patient was here 04/17/21 and was assessed. COVID-19 and Flu were Negative. Patient was discharged and instructed to return if worse. Patient did better somewhat shortly afterwards but then symptoms returned worse these past two days.  NAD Noted during Triage. A&Ox4. GCS 15. Ambulatory.

## 2021-04-29 NOTE — ED Notes (Signed)
BCG checked- 203 Triage nurse Lil notified

## 2021-04-30 ENCOUNTER — Other Ambulatory Visit: Payer: Self-pay

## 2021-04-30 ENCOUNTER — Emergency Department (HOSPITAL_BASED_OUTPATIENT_CLINIC_OR_DEPARTMENT_OTHER)
Admission: EM | Admit: 2021-04-30 | Discharge: 2021-04-30 | Disposition: A | Discharge status: Home / Self Care | Payer: Self-pay | Attending: Emergency Medicine | Admitting: Emergency Medicine

## 2021-04-30 DIAGNOSIS — J40 Bronchitis, not specified as acute or chronic: Secondary | ICD-10-CM | POA: Insufficient documentation

## 2021-04-30 DIAGNOSIS — Z20822 Contact with and (suspected) exposure to covid-19: Secondary | ICD-10-CM | POA: Insufficient documentation

## 2021-04-30 LAB — RESP PANEL BY RT-PCR (FLU A&B, COVID) ARPGX2
Influenza A by PCR: NEGATIVE
Influenza B by PCR: NEGATIVE
SARS Coronavirus 2 by RT PCR: NEGATIVE

## 2021-04-30 LAB — CBG MONITORING, ED: Glucose-Capillary: 345 mg/dL — ABNORMAL HIGH (ref 70–99)

## 2021-04-30 MED ORDER — ALBUTEROL SULFATE HFA 108 (90 BASE) MCG/ACT IN AERS
2.0000 | INHALATION_SPRAY | RESPIRATORY_TRACT | Status: DC | PRN
  Administered 2021-04-30: 2 via RESPIRATORY_TRACT

## 2021-04-30 MED ORDER — BENZONATATE 200 MG PO CAPS: 200.0000 mg | ORAL_CAPSULE | Freq: Three times a day (TID) | ORAL | 0 refills | Status: DC | PRN

## 2021-04-30 MED ORDER — DOXYCYCLINE HYCLATE 100 MG PO CAPS: 100.0000 mg | ORAL_CAPSULE | Freq: Two times a day (BID) | ORAL | 0 refills | Status: DC

## 2021-04-30 MED ORDER — DOXYCYCLINE HYCLATE 100 MG PO TABS
100.0000 mg | ORAL_TABLET | Freq: Two times a day (BID) | ORAL | 0 refills | Status: DC
  Filled 2021-04-30: qty 20, 10d supply, fill #0

## 2021-04-30 MED ORDER — BENZONATATE 200 MG PO CAPS
200.0000 mg | ORAL_CAPSULE | Freq: Three times a day (TID) | ORAL | 0 refills | Status: DC | PRN
  Filled 2021-04-30: qty 20, 7d supply, fill #0

## 2021-04-30 NOTE — ED Triage Notes (Signed)
Pt presents for SOB x2 days, recent URI 2 weeks ago, cough now worse (productive, green), fatigued, falling asleep in the day time. Was in ER here earlier today but left before receiving CT  Denies CP, N/V/D  H/o DM, bipolar.

## 2021-04-30 NOTE — ED Provider Notes (Signed)
[facility] EMERGENCY DEPT Provider Note   CSN: [id number] Arrival date & time: 04/30/21  0034     History  No chief complaint on file.   Danielle Barker is a 46 y.o. female.  Patient presents to the emergency department with persistent cough and chest congestion.  Patient reports that has been ongoing for several weeks.  Patient was seen here 2 weeks ago and tested negative for COVID.  She reports that she is still experiencing significant cough and shortness of breath when she coughs.  Cough is productive of sputum.      Home Medications Prior to Admission medications   Medication Sig Start Date End Date Taking? Authorizing Provider  benzonatate (TESSALON) 200 MG capsule Take 1 capsule (200 mg total) by mouth 3 (three) times daily as needed for cough. 04/30/21  Yes Jarrett Chicoine, Gwenyth Allegra, MD  doxycycline (VIBRAMYCIN) 100 MG capsule Take 1 capsule (100 mg total) by mouth 2 (two) times daily. 04/30/21  Yes Lavone Barrientes, Gwenyth Allegra, MD  ARIPiprazole (ABILIFY) 30 MG tablet Take 1 tablet (30 mg total) by mouth daily. 01/30/21   Nwoko, Terese Door, PA  Blood Glucose Monitoring Suppl (TRUE METRIX METER) w/Device KIT use kit to check blood glucose 3 three times daily before meals. 03/31/21   Charlott Rakes, MD  clonazePAM (KLONOPIN) 1 MG tablet Take 1 tablet (1 mg total) by mouth 2 (two) times daily as needed for anxiety. 03/31/21 03/31/22  Nwoko, Terese Door, PA  dicyclomine (BENTYL) 20 MG tablet Take 1 tablet (20 mg total) by mouth 2 (two) times daily. Patient not taking: Reported on 03/31/2021 02/28/21   Hazel Sams, PA-C  Dulaglutide (TRULICITY) 1.5 WY/6.3ZC SOPN Inject 1.5 mg into the skin once a week. 04/17/21   Charlott Rakes, MD  escitalopram (LEXAPRO) 20 MG tablet Take 2 tablets (40 mg total) by mouth daily. 01/30/21   Nwoko, Terese Door, PA  fenofibrate (TRICOR) 48 MG tablet Take 1 tablet (48 mg total) by mouth daily. 04/17/21 05/17/21  Charlott Rakes, MD  furosemide (LASIX) 40  MG tablet Take 1 tablet (40 mg total) by mouth 2 (two) times daily as needed. 03/23/21   Blanchie Dessert, MD  gabapentin (NEURONTIN) 300 MG capsule Take 2 capsules (600 mg total) by mouth 3 (three) times daily. 03/31/21 04/30/21  Charlott Rakes, MD  glucose blood (TRUE METRIX BLOOD GLUCOSE TEST) test strip Use 1 test strip to check blood glucose 3 times daily 03/31/21   Charlott Rakes, MD  hydrochlorothiazide (HYDRODIURIL) 12.5 MG tablet Take 1 tablet (12.5 mg total) by mouth daily for 5 days. 03/31/21 04/30/21  Charlott Rakes, MD  insulin aspart (NOVOLOG FLEXPEN) 100 UNIT/ML FlexPen Inject 0-12 Units into the skin 3 (three) times daily with meals. As per sliding scale 03/31/21   Charlott Rakes, MD  insulin glargine (LANTUS) 100 UNIT/ML injection Inject 0.5 mLs (50 Units total) into the skin daily. 03/31/21   Charlott Rakes, MD  metFORMIN (GLUCOPHAGE) 1000 MG tablet Take 1 tablet (1,000 mg total) by mouth 2 (two) times daily with a meal. 03/31/21   Charlott Rakes, MD  naproxen (NAPROSYN) 500 MG tablet Take 1 tablet (500 mg total) by mouth 2 (two) times daily. 09/18/20   Maudie Flakes, MD  rosuvastatin (CRESTOR) 20 MG tablet Take 1 tablet (20 mg total) by mouth daily for 30 doses. 04/17/21 05/17/21  Charlott Rakes, MD  TRUEplus Lancets 28G MISC use 1 lancet to check blood glucose 3 times daily before meals. 03/31/21   Charlott Rakes,  MD  citalopram (CELEXA) 40 MG tablet Take 40 mg by mouth daily.  11/25/20  [provider]      Allergies    Compazine [prochlorperazine], Dilaudid [hydromorphone], Reglan [metoclopramide], and Zofran [ondansetron]    Review of Systems   Review of Systems  Constitutional:  Positive for fever.  Respiratory:  Positive for cough and shortness of breath.    Physical Exam Updated Vital Signs BP 130/84    Pulse 98    Temp 99.1 F (37.3 C)    Resp 18    Wt 52.2 kg    SpO2 97%    BMI 18.56 kg/m  Physical Exam Vitals and nursing note reviewed.  Constitutional:       General: She is not in acute distress.    Appearance: Normal appearance. She is well-developed.  HENT:     Head: Normocephalic and atraumatic.     Right Ear: Hearing normal.     Left Ear: Hearing normal.     Nose: Nose normal.  Eyes:     Conjunctiva/sclera: Conjunctivae normal.     Pupils: Pupils are equal, round, and reactive to light.  Cardiovascular:     Rate and Rhythm: Regular rhythm.     Heart sounds: S1 normal and S2 normal. No murmur heard.   No friction rub. No gallop.  Pulmonary:     Effort: Pulmonary effort is normal. No respiratory distress.     Breath sounds: Normal breath sounds.  Chest:     Chest wall: No tenderness.  Abdominal:     General: Bowel sounds are normal.     Palpations: Abdomen is soft.     Tenderness: There is no abdominal tenderness. There is no guarding or rebound. Negative signs include Murphy's sign and McBurney's sign.     Hernia: No hernia is present.  Musculoskeletal:        General: Normal range of motion.     Cervical back: Normal range of motion and neck supple.  Skin:    General: Skin is warm and dry.     Findings: No rash.  Neurological:     Mental Status: She is alert and oriented to person, place, and time.     GCS: GCS eye subscore is 4. GCS verbal subscore is 5. GCS motor subscore is 6.     Cranial Nerves: No cranial nerve deficit.     Sensory: No sensory deficit.     Coordination: Coordination normal.  Psychiatric:        Speech: Speech normal.        Behavior: Behavior normal.        Thought Content: Thought content normal.    ED Results / Procedures / Treatments   Labs (all labs ordered are listed, but only abnormal results are displayed) Labs Reviewed  CBG MONITORING, ED - Abnormal; Notable for the following components:      Result Value   Glucose-Capillary 345 (*)    All other components within normal limits  RESP PANEL BY RT-PCR (FLU A&B, COVID) [id number]    EKG None  Radiology DG Chest 2 View  Result Date:  04/29/2021 CLINICAL DATA:  Shortness of breath, cough. EXAM: CHEST - 2 VIEW COMPARISON:  March 04, 2021. FINDINGS: The heart size and mediastinal contours are within normal limits. Both lungs are clear. The visualized skeletal structures are unremarkable. IMPRESSION: No active cardiopulmonary disease. Electronically Signed   By: Marijo Conception M.D.   On: 04/29/2021 15:39    Procedures Procedures  Medications Ordered in ED Medications  albuterol (VENTOLIN HFA) 108 (90 Base) MCG/ACT inhaler 2 puff (has no administration in time range)    ED Course/ Medical Decision Making/ A&P                           Medical Decision Making  Presents for persistent cough and shortness of breath.  Symptoms ongoing for more than 2 weeks.  Patient reports that her shortness of breath occurs when she has having her coughing spells, not persistent.  She is not experiencing chest pain.  Patient reports that she is extremely fatigued from the coughing, not sleeping at night.  Patient concerned she might have pneumonia.  She is a smoker.  No active wheezing currently.  Heart rate is slightly elevated but I do not suspect PE.  We will treat empirically with doxycycline and albuterol.  Patient has a history of poorly controlled diabetes, not a candidate for prednisone at this time.        Final Clinical Impression(s) / ED Diagnoses Final diagnoses:  Bronchitis    Rx / DC Orders ED Discharge Orders          Ordered    doxycycline (VIBRAMYCIN) 100 MG capsule  2 times daily        04/30/21 0204    benzonatate (TESSALON) 200 MG capsule  3 times daily PRN        04/30/21 0204              Orpah Greek, MD 04/30/21 [phone]

## 2021-04-30 NOTE — ED Notes (Signed)
Pt verbalizes understanding of discharge instructions. Opportunity for questioning and answers were provided. Pt discharged from ED to home.   ? ?

## 2021-05-05 ENCOUNTER — Other Ambulatory Visit: Payer: Self-pay

## 2021-05-05 ENCOUNTER — Other Ambulatory Visit (HOSPITAL_COMMUNITY): Payer: Self-pay | Admitting: Physician Assistant

## 2021-05-05 DIAGNOSIS — F3181 Bipolar II disorder: Secondary | ICD-10-CM

## 2021-05-06 ENCOUNTER — Other Ambulatory Visit (HOSPITAL_COMMUNITY): Payer: Self-pay | Admitting: Physician Assistant

## 2021-05-06 ENCOUNTER — Other Ambulatory Visit: Payer: Self-pay

## 2021-05-06 DIAGNOSIS — F3181 Bipolar II disorder: Secondary | ICD-10-CM

## 2021-05-06 MED ORDER — ARIPIPRAZOLE 30 MG PO TABS
30.0000 mg | ORAL_TABLET | Freq: Every day | ORAL | 1 refills | Status: DC
  Filled 2021-05-06: qty 30, 30d supply, fill #0
  Filled 2021-06-09: qty 30, 30d supply, fill #1

## 2021-05-06 NOTE — Progress Notes (Signed)
Patient's medication re-prescribed to new pharmacy.

## 2021-05-07 ENCOUNTER — Other Ambulatory Visit: Payer: Self-pay

## 2021-05-15 ENCOUNTER — Encounter (HOSPITAL_COMMUNITY): Payer: Self-pay | Admitting: Physician Assistant

## 2021-05-15 ENCOUNTER — Other Ambulatory Visit: Payer: Self-pay

## 2021-05-15 ENCOUNTER — Ambulatory Visit (INDEPENDENT_AMBULATORY_CARE_PROVIDER_SITE_OTHER): Payer: No Payment, Other | Admitting: Physician Assistant

## 2021-05-15 DIAGNOSIS — F3181 Bipolar II disorder: Secondary | ICD-10-CM

## 2021-05-15 DIAGNOSIS — F410 Panic disorder [episodic paroxysmal anxiety] without agoraphobia: Secondary | ICD-10-CM

## 2021-05-15 DIAGNOSIS — F411 Generalized anxiety disorder: Secondary | ICD-10-CM

## 2021-05-15 MED ORDER — CLONAZEPAM 1 MG PO TABS: 1.0000 mg | ORAL_TABLET | Freq: Three times a day (TID) | ORAL | 0 refills | Status: DC | PRN

## 2021-05-15 NOTE — Progress Notes (Signed)
BH MD/PA/NP OP Progress Note  05/15/2021 9:13 AM KESLEIGH Barker  MRN:  [id number]  Chief Complaint:  Chief Complaint   walk in    HPI:   Danielle Barker is a 46 year old female with a past psychiatric history significant for generalized anxiety disorder with panic attacks and bipolar 2 disorder who presents to [facility] for follow-up and medication management.  Patient is currently being managed on the following medications:  Clonazepam 1 mg 2 times daily as needed Abilify 30 mg daily Escitalopram (Lexapro) 40 milligrams daily  Patient presents to the clinic stating that she has been having meltdowns and becoming increasingly stressed at work.  She reports that work has been extremely busy and that her finances are tight.  Patient rates her anxiety a 9 out of 10.  In addition to her worsening anxiety patient has endorsed an increase in the frequency of her panic attacks.  Patient states that she has been having roughly 3-4 panic attacks per week.  Normally, patient states that she is able to talk down her panic attacks but recently, she reports having no success in eliminating her panic attacks through the talk down method.  Patient denies depressive episodes and further denies the following symptoms: low mood, lack of motivation, and low activity.  A GAD-7 screen was performed with the patient scoring a 20.  Patient is alert and oriented x4, calm, cooperative, and fully engaged in conversation during the encounter.  Patient endorses good mood.  Patient denies suicidal or homicidal ideations.  She further denies auditory or visual hallucinations and does not appear to be responding to internal/external stimuli.  Patient endorses good sleep and receives on average 7 hours of sleep each night.  Patient endorses good appetite and eats on average 2 meals per day along with a snack.  Patient denies alcohol consumption, tobacco use, and illicit drug  use.  Visit Diagnosis:    ICD-10-CM   1. Generalized anxiety disorder with panic attacks  F41.1 clonazePAM (KLONOPIN) 1 MG tablet   F41.0     2. Bipolar 2 disorder (HCC)  F31.81       Past Psychiatric History:  Insomnia Generalized anxiety disorder Bipolar 2 disorder  Past Medical History:  Past Medical History:  Diagnosis Date   Diabetes mellitus without complication (HCC)    High cholesterol    High triglycerides    Mood disorder (HCC)     Past Surgical History:  Procedure Laterality Date   APPENDECTOMY     CHOLECYSTECTOMY     SHOULDER SURGERY Left    TONSILLECTOMY      Family Psychiatric History:  Aunt (maternal) - schizophrenic Mother - unipolar/severe depression Father - Alcohol abuse, anxiety, possible bipolar but has not been diagnosed Technical brewer - anxious, paranoid Biological Sister - Anxiety, recovering alcoholic (sober for 4 years)  Family History:  Family History  Problem Relation Age of Onset   Cancer Mother    Heart failure Mother    Cancer Father     Social History:  Social History   Socioeconomic History   Marital status: Divorced    Spouse name: Not on file   Number of children: Not on file   Years of education: Not on file   Highest education level: Not on file  Occupational History   Not on file  Tobacco Use   Smoking status: Never   Smokeless tobacco: Never  Vaping Use   Vaping Use: Never used  Substance and Sexual  Activity   Alcohol use: Not Currently   Drug use: Never   Sexual activity: Yes  Other Topics Concern   Not on file  Social History Narrative   Not on file   Social Determinants of Health   Financial Resource Strain: Not on file  Food Insecurity: Not on file  Transportation Needs: Not on file  Physical Activity: Not on file  Stress: Not on file  Social Connections: Not on file    Allergies:  Allergies  Allergen Reactions   Compazine [Prochlorperazine]    Dilaudid [Hydromorphone]     intolerence    Reglan [Metoclopramide]    Zofran [Ondansetron]     Metabolic Disorder Labs: Lab Results  Component Value Date   HGBA1C 9.2 (A) 03/31/2021   No results found for: PROLACTIN No results found for: CHOL, TRIG, HDL, CHOLHDL, VLDL, LDLCALC Lab Results  Component Value Date   TSH 3.727 03/05/2021    Therapeutic Level Labs: No results found for: LITHIUM No results found for: VALPROATE No components found for:  CBMZ  Current Medications: Current Outpatient Medications  Medication Sig Dispense Refill   ARIPiprazole (ABILIFY) 30 MG tablet Take 1 tablet (30 mg total) by mouth daily. 30 tablet 1   benzonatate (TESSALON) 200 MG capsule Take 1 capsule (200 mg total) by mouth 3 (three) times daily as needed for cough. 20 capsule 0   Blood Glucose Monitoring Suppl (TRUE METRIX METER) w/Device KIT use kit to check blood glucose 3 three times daily before meals. 1 kit 0   clonazePAM (KLONOPIN) 1 MG tablet Take 1 tablet (1 mg total) by mouth 3 (three) times daily as needed for anxiety. 90 tablet 0   dicyclomine (BENTYL) 20 MG tablet Take 1 tablet (20 mg total) by mouth 2 (two) times daily. (Patient not taking: Reported on 03/31/2021) 20 tablet 0   doxycycline (VIBRA-TABS) 100 MG tablet Take 1 tablet (100 mg total) by mouth 2 (two) times daily. 20 tablet 0   Dulaglutide (TRULICITY) 1.5 DD/2.2GU SOPN Inject 1.5 mg into the skin once a week. 2 mL 2   escitalopram (LEXAPRO) 20 MG tablet Take 2 tablets (40 mg total) by mouth daily. 60 tablet 1   fenofibrate (TRICOR) 48 MG tablet Take 1 tablet (48 mg total) by mouth daily. 30 tablet 2   furosemide (LASIX) 40 MG tablet Take 1 tablet (40 mg total) by mouth 2 (two) times daily as needed. 15 tablet 0   gabapentin (NEURONTIN) 300 MG capsule Take 2 capsules (600 mg total) by mouth 3 (three) times daily. 180 capsule 3   glucose blood (TRUE METRIX BLOOD GLUCOSE TEST) test strip Use 1 test strip to check blood glucose 3 times daily 100 each 12    hydrochlorothiazide (HYDRODIURIL) 12.5 MG tablet Take 1 tablet (12.5 mg total) by mouth daily for 5 days. 30 tablet 1   insulin aspart (NOVOLOG FLEXPEN) 100 UNIT/ML FlexPen Inject 0-12 Units into the skin 3 (three) times daily with meals. As per sliding scale 15 mL 11   insulin glargine (LANTUS) 100 UNIT/ML injection Inject 0.5 mLs (50 Units total) into the skin daily. 10 mL 11   metFORMIN (GLUCOPHAGE) 1000 MG tablet Take 1 tablet (1,000 mg total) by mouth 2 (two) times daily with a meal. 120 tablet 3   naproxen (NAPROSYN) 500 MG tablet Take 1 tablet (500 mg total) by mouth 2 (two) times daily. 30 tablet 0   rosuvastatin (CRESTOR) 20 MG tablet Take 1 tablet (20 mg total) by mouth  daily for 30 doses. 30 tablet 2   TRUEplus Lancets 28G MISC use 1 lancet to check blood glucose 3 times daily before meals. 100 each 12   No current facility-administered medications for this visit.     Musculoskeletal: Strength & Muscle Tone: within normal limits Gait & Station: normal Patient leans: N/A  Psychiatric Specialty Exam: Review of Systems  Psychiatric/Behavioral:  Negative for agitation, decreased concentration, dysphoric mood, hallucinations, self-injury, sleep disturbance and suicidal ideas. The patient is nervous/anxious. The patient is not hyperactive.    There were no vitals taken for this visit.There is no height or weight on file to calculate BMI.  General Appearance: Casual  Eye Contact:  Good  Speech:  Clear and Coherent and Normal Rate  Volume:  Normal  Mood:  Anxious and Euthymic  Affect:  Appropriate and Congruent  Thought Process:  Coherent, Goal Directed, and Descriptions of Associations: Intact  Orientation:  Full (Time, Place, and Person)  Thought Content: WDL   Suicidal Thoughts:  No  Homicidal Thoughts:  No  Memory:  Immediate;   Good Recent;   Good Remote;   Good  Judgement:  Good  Insight:  Good  Psychomotor Activity:  Normal  Concentration:  Concentration: Good and  Attention Span: Good  Recall:  Good  Fund of Knowledge: Good  Language: Good  Akathisia:  NA  Handed:  Right  AIMS (if indicated): not done  Assets:  Communication Skills Desire for Improvement Financial Resources/Insurance Housing Social Support Transportation Vocational/Educational  ADL's:  Intact  Cognition: WNL  Sleep:  Good   Screenings: GAD-7    Flowsheet Row Clinical Support from 05/15/2021 in [facility] Office Visit from 03/31/2021 in [place] from 03/26/2021 in [place] from 02/12/2021 in [place] from 01/30/2021 in [facility]  Total GAD-7 Score _0 PHQ2-9    [facility] from 05/15/2021 in [facility] Office Visit from 03/31/2021 in [place] from 03/26/2021 in [place] from 02/12/2021 in [place] from 01/30/2021 in [facility]  PHQ-2 Total Score 0 0 0 0 0  PHQ-9 Total Score -- 3 -- -- --      Flowsheet Row Clinical Support from 05/15/2021 in [facility] ED from 04/30/2021 in [facility] ED from 04/29/2021 in [facility]  [place] No Risk Error: Q3, 4, or 5 should not be populated when Q2 is No No Risk        Assessment and Plan:   Renea Schoonmaker is a 46 year old female with a past psychiatric history significant for generalized anxiety disorder with panic attacks and bipolar 2 disorder who presents to [facility] for follow-up and medication management.  Patient presents to the clinic with a chief  complaint of worsening anxiety and an increase in her panic attacks.  Patient is currently on the max dosage of her escitalopram.  Provider adjusted patient's clonazepam schedule from 1 mg 2 times daily as needed to 1 mg 3 times daily as needed for the management of her anxiety and panic attacks.  Patient was agreeable to recommendation.  Patient's medication to be e-prescribed to pharmacy of choice.  1. Generalized anxiety disorder with panic attacks Patient to continue taking escitalopram 40 mg daily for the management of her generalized anxiety disorder with panic attacks  - clonazePAM (KLONOPIN) 1 MG tablet; Take 1 tablet (1 mg total) by mouth 3 (three) times daily as needed for anxiety.  Dispense: 90 tablet; Refill: 0  2. Bipolar 2 disorder ([place]) Patient to continue taking escitalopram 40 mg daily for the management of her bipolar 2 disorder Patient to continue taking Abilify 30 mg daily for the management of her bipolar 2 disorder  Patient to follow up in 2 months Provider spent a total of 12 minutes with the patient/reviewing patient's chart  Malachy Mood, PA 05/15/2021, 9:13 AM

## 2021-05-16 ENCOUNTER — Ambulatory Visit: Payer: Self-pay | Admitting: Internal Medicine

## 2021-05-17 NOTE — Progress Notes (Unsigned)
° °  S:    PCP: Dr. Margarita Rana   Patient presents for diabetes evaluation, education, and management. Patient was referred and last seen by Primary Care Provider on 03/31/2021. A1c was 9.2% at that visit. Pt was started on Trulicity and 0000000 insulin was changed to Lantus. At last visit with CPP on [id number], Trulicity was increased.   ***  -tolerating incr trulicity? Ideally continue to incr if available?  -recent illness  -[place] next on 07/02/21  *** Patient reports Diabetes was diagnosed in 2005. She was started on insulin around 7 years ago. She is tolerating the Trulicity well. Denies any NV, abdominal pain. No changes in vision. She has adjusted her Lantus to 70 units daily and feels that this controls her glucose at home better. In regards to her medical hx, she denies any hx of CHF or ACS. No stroke or CKD. Denies any thyroid cancer or pancreatitis.    Family/Social History:  -Fhx: HF (mother), MI (maternal uncle, maternal aunt, MGM), stroke (maternal aunt) -Tobacco: never smoker  -Alcohol: none reported   Insurance coverage/medication affordability: none. Uses patient assistance here.   Medication adherence reported.   Current diabetes medications include: metformin 1000 mg BID, Lantus 70 units daily, Trulicity 1.5 mg weekly, Novolog sliding scale  Current hypertension medications include: HCTZ 12.5 mg daily  Current hyperlipidemia medications include: rosuvastatin 20 mg daily, fenofibrate 48 mg daily   Patient reports rare hypoglycemic events that only occur if she has prolonged periods between meals.   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 Walks.    Patient denies nocturia (nighttime urination).  Patient denies  neuropathy (nerve pain). Patient denies visual changes. Patient reports self foot exams.     O:  POCT BG: ***  Lab Results  Component Value Date   HGBA1C 9.2 (A) 03/31/2021   There were no vitals filed for this visit.  Lipid Panel  No results found for: CHOL, TRIG, HDL, CHOLHDL, VLDL, LDLCALC, LDLDIRECT  Home fasting blood sugars: *** 100s-180s  2 hour post-meal/random blood sugars: *** mostly in the 230s.  Clinical Atherosclerotic Cardiovascular Disease (ASCVD): No  The ASCVD Risk score (Arnett DK, et al., 2019) failed to calculate for the following reasons:   Cannot find a previous HDL lab   Cannot find a previous total cholesterol lab   A/P: Diabetes longstanding currently uncontrolled. Patient is able to verbalize appropriate hypoglycemia management plan. Medication adherence appears appropriate. -Increased dose of Trulicity to 1.5 mg weekly.  -Continue metformin, Lantus, and Novolog at current doses.  -Precaution given to hold Novolog if pre-meal CBG is <150.  -Extensively discussed pathophysiology of diabetes, recommended lifestyle interventions, dietary effects on blood sugar control -Counseled on s/sx of and management of hypoglycemia -Next A1C anticipated 06/2021.   Written patient instructions provided. Total time in face to face counseling 30 minutes.   Follow up Pharmacist Clinic Visit in 1 month.

## 2021-05-19 ENCOUNTER — Ambulatory Visit: Payer: Self-pay | Admitting: Pharmacist

## 2021-05-24 ENCOUNTER — Other Ambulatory Visit: Payer: Self-pay

## 2021-05-24 ENCOUNTER — Encounter (HOSPITAL_BASED_OUTPATIENT_CLINIC_OR_DEPARTMENT_OTHER): Payer: Self-pay

## 2021-05-24 ENCOUNTER — Emergency Department (HOSPITAL_BASED_OUTPATIENT_CLINIC_OR_DEPARTMENT_OTHER)
Admission: EM | Admit: 2021-05-24 | Discharge: 2021-05-24 | Disposition: A | Discharge status: Home / Self Care | Payer: Self-pay | Attending: Emergency Medicine | Admitting: Emergency Medicine

## 2021-05-24 ENCOUNTER — Emergency Department (HOSPITAL_BASED_OUTPATIENT_CLINIC_OR_DEPARTMENT_OTHER): Payer: Self-pay | Admitting: Radiology

## 2021-05-24 DIAGNOSIS — Z7984 Long term (current) use of oral hypoglycemic drugs: Secondary | ICD-10-CM | POA: Insufficient documentation

## 2021-05-24 DIAGNOSIS — Z794 Long term (current) use of insulin: Secondary | ICD-10-CM | POA: Insufficient documentation

## 2021-05-24 DIAGNOSIS — Z79899 Other long term (current) drug therapy: Secondary | ICD-10-CM | POA: Insufficient documentation

## 2021-05-24 DIAGNOSIS — R0789 Other chest pain: Secondary | ICD-10-CM | POA: Insufficient documentation

## 2021-05-24 LAB — BASIC METABOLIC PANEL
Anion gap: 8 (ref 5–15)
BUN: 14 mg/dL (ref 6–20)
CO2: 26 mmol/L (ref 22–32)
Calcium: 8.9 mg/dL (ref 8.9–10.3)
Chloride: 99 mmol/L (ref 98–111)
Creatinine, Ser: 0.72 mg/dL (ref 0.44–1.00)
GFR, Estimated: >60 mL/min (ref >60)
Glucose, Bld: 244 mg/dL — ABNORMAL HIGH (ref 70–99)
Potassium: 4.3 mmol/L (ref 3.5–5.1)
Sodium: 133 mmol/L — ABNORMAL LOW (ref 135–145)

## 2021-05-24 LAB — TROPONIN I (HIGH SENSITIVITY)
Troponin I (High Sensitivity): 2 ng/L (ref <18)
Troponin I (High Sensitivity): <2 ng/L (ref <18)

## 2021-05-24 LAB — CBC
HCT: 39.6 % (ref 36.0–46.0)
Hemoglobin: 13.3 g/dL (ref 12.0–15.0)
MCH: 30 pg (ref 26.0–34.0)
MCHC: 33.6 g/dL (ref 30.0–36.0)
MCV: 89.4 fL (ref 80.0–100.0)
Platelets: 355 10*3/uL (ref 150–400)
RBC: 4.43 MIL/uL (ref 3.87–5.11)
RDW: 12.6 % (ref 11.5–15.5)
WBC: 7.7 10*3/uL (ref 4.0–10.5)
nRBC: 0 % (ref 0.0–0.2)

## 2021-05-24 LAB — D-DIMER, QUANTITATIVE: D-Dimer, Quant: 0.31 ug/mL-FEU (ref 0.00–0.50)

## 2021-05-24 MED ORDER — KETOROLAC TROMETHAMINE 60 MG/2ML IM SOLN
60.0000 mg | Freq: Once | INTRAMUSCULAR | Status: AC
  Administered 2021-05-24: 60 mg via INTRAMUSCULAR
  Filled 2021-05-24: qty 2

## 2021-05-24 NOTE — ED Provider Notes (Addendum)
[facility] DEPT Provider Note   CSN: [id number] Arrival date & time: 05/24/21  1342     History  Chief Complaint  Patient presents with   Chest Pain    Danielle Barker is a 46 y.o. female.  This is a 46 year old female who presents with several hours of constant left-sided chest discomfort radiating to her back.  States that the symptoms began gradually.  They have not been associated with dyspnea or diaphoresis.  No recent fever or cough or congestion.  Pain is nonpleuritic and is not exertional.  States that nothing makes it better or worse.  No prior history of same.  Patient does endorse that she has been under a lot of stress recently and feels that that could be related to this.  No treatment use prior to arrival      Home Medications Prior to Admission medications   Medication Sig Start Date End Date Taking? Authorizing Provider  ARIPiprazole (ABILIFY) 30 MG tablet Take 1 tablet (30 mg total) by mouth daily. 05/06/21   Nwoko, Terese Door, PA  benzonatate (TESSALON) 200 MG capsule Take 1 capsule (200 mg total) by mouth 3 (three) times daily as needed for cough. 04/30/21   Orpah Greek, MD  Blood Glucose Monitoring Suppl (TRUE METRIX METER) w/Device KIT use kit to check blood glucose 3 three times daily before meals. 03/31/21   Charlott Rakes, MD  clonazePAM (KLONOPIN) 1 MG tablet Take 1 tablet (1 mg total) by mouth 3 (three) times daily as needed for anxiety. 05/15/21 05/15/22  Nwoko, Terese Door, PA  dicyclomine (BENTYL) 20 MG tablet Take 1 tablet (20 mg total) by mouth 2 (two) times daily. Patient not taking: Reported on 03/31/2021 02/28/21   Hazel Sams, PA-C  doxycycline (VIBRA-TABS) 100 MG tablet Take 1 tablet (100 mg total) by mouth 2 (two) times daily. 04/30/21   Orpah Greek, MD  Dulaglutide (TRULICITY) 1.5 YD/7.4JO SOPN Inject 1.5 mg into the skin once a week. 04/17/21   Charlott Rakes, MD  escitalopram (LEXAPRO) 20 MG tablet Take  2 tablets (40 mg total) by mouth daily. 01/30/21   Nwoko, Terese Door, PA  fenofibrate (TRICOR) 48 MG tablet Take 1 tablet (48 mg total) by mouth daily. 04/17/21 05/17/21  Charlott Rakes, MD  furosemide (LASIX) 40 MG tablet Take 1 tablet (40 mg total) by mouth 2 (two) times daily as needed. 03/23/21   Blanchie Dessert, MD  gabapentin (NEURONTIN) 300 MG capsule Take 2 capsules (600 mg total) by mouth 3 (three) times daily. 03/31/21 06/05/21  Charlott Rakes, MD  glucose blood (TRUE METRIX BLOOD GLUCOSE TEST) test strip Use 1 test strip to check blood glucose 3 times daily 03/31/21   Charlott Rakes, MD  hydrochlorothiazide (HYDRODIURIL) 12.5 MG tablet Take 1 tablet (12.5 mg total) by mouth daily for 5 days. 03/31/21 04/30/21  Charlott Rakes, MD  insulin aspart (NOVOLOG FLEXPEN) 100 UNIT/ML FlexPen Inject 0-12 Units into the skin 3 (three) times daily with meals. As per sliding scale 03/31/21   Charlott Rakes, MD  insulin glargine (LANTUS) 100 UNIT/ML injection Inject 0.5 mLs (50 Units total) into the skin daily. 03/31/21   Charlott Rakes, MD  metFORMIN (GLUCOPHAGE) 1000 MG tablet Take 1 tablet (1,000 mg total) by mouth 2 (two) times daily with a meal. 03/31/21   Charlott Rakes, MD  naproxen (NAPROSYN) 500 MG tablet Take 1 tablet (500 mg total) by mouth 2 (two) times daily. 09/18/20   Maudie Flakes, MD  rosuvastatin (CRESTOR) 20 MG tablet Take 1 tablet (20 mg total) by mouth daily for 30 doses. 04/17/21 05/17/21  Charlott Rakes, MD  TRUEplus Lancets 28G MISC use 1 lancet to check blood glucose 3 times daily before meals. 03/31/21   Charlott Rakes, MD  citalopram (CELEXA) 40 MG tablet Take 40 mg by mouth daily.  11/25/20  [provider]      Allergies    Compazine [prochlorperazine], Dilaudid [hydromorphone], Reglan [metoclopramide], and Zofran [ondansetron]    Review of Systems   Review of Systems  All other systems reviewed and are negative.  Physical Exam Updated Vital Signs BP 122/73     Pulse 78    Temp 98.1 F (36.7 C)    Resp 17    Ht 1.651 m ($Remove'5\' 5"'[id number]$ )    Wt 94.8 kg    LMP 05/04/2021    SpO2 97%    BMI 34.78 kg/m  Physical Exam Vitals and nursing note reviewed.  Constitutional:      General: She is not in acute distress.    Appearance: Normal appearance. She is well-developed. She is not toxic-appearing.  HENT:     Head: Normocephalic and atraumatic.  Eyes:     General: Lids are normal.     Conjunctiva/sclera: Conjunctivae normal.     Pupils: Pupils are equal, round, and reactive to light.  Neck:     Thyroid: No thyroid mass.     Trachea: No tracheal deviation.  Cardiovascular:     Rate and Rhythm: Normal rate and regular rhythm.     Heart sounds: Normal heart sounds. No murmur heard.   No gallop.  Pulmonary:     Effort: Pulmonary effort is normal. No respiratory distress.     Breath sounds: Normal breath sounds. No stridor. No decreased breath sounds, wheezing, rhonchi or rales.  Abdominal:     General: There is no distension.     Palpations: Abdomen is soft.     Tenderness: There is no abdominal tenderness. There is no rebound.  Musculoskeletal:        General: No tenderness. Normal range of motion.     Cervical back: Normal range of motion and neck supple.  Skin:    General: Skin is warm and dry.     Findings: No abrasion or rash.  Neurological:     Mental Status: She is alert and oriented to person, place, and time. Mental status is at baseline.     GCS: GCS eye subscore is 4. GCS verbal subscore is 5. GCS motor subscore is 6.     Cranial Nerves: No cranial nerve deficit.     Sensory: No sensory deficit.     Motor: Motor function is intact.  Psychiatric:        Attention and Perception: Attention normal.        Speech: Speech normal.        Behavior: Behavior normal.    ED Results / Procedures / Treatments   Labs (all labs ordered are listed, but only abnormal results are displayed) Labs Reviewed  BASIC METABOLIC PANEL - Abnormal; Notable for  the following components:      Result Value   Sodium 133 (*)    Glucose, Bld 244 (*)    All other components within normal limits  CBC  PREGNANCY, URINE  D-DIMER, QUANTITATIVE  TROPONIN I (HIGH SENSITIVITY)  TROPONIN I (HIGH SENSITIVITY)    EKG EKG Interpretation  Date/Time:  Saturday May 24 2021 13:58:26 EST Ventricular Rate:  102 PR Interval:  146 QRS Duration: 82 QT Interval:  346 QTC Calculation: 450 R Axis:   92 Text Interpretation: Sinus tachycardia Rightward axis Borderline ECG When compared with ECG of 05-Mar-2021 16:49, PREVIOUS ECG IS PRESENT Confirmed by Campbell Stall ([id number]) on 7/41/6384 3:03:14 PM  Radiology DG Chest 2 View  Result Date: 05/24/2021 CLINICAL DATA:  Chest pain radiating to the back and down the left arm. Associated shortness of breath and cough. EXAM: CHEST - 2 VIEW COMPARISON:  04/29/2021 and older studies. FINDINGS: Normal heart, mediastinum and hila. Clear lungs.  No pleural effusion or pneumothorax. Skeletal structures are intact. IMPRESSION: No active cardiopulmonary disease. Electronically Signed   By: Lajean Manes M.D.   On: 05/24/2021 14:36    Procedures Procedures    Medications Ordered in ED Medications  ketorolac (TORADOL) injection 60 mg (has no administration in time range)    ED Course/ Medical Decision Making/ A&P                           Medical Decision Making Amount and/or Complexity of Data Reviewed Labs: ordered. Radiology: ordered.  Risk Prescription drug management.  Old records reviewed. Patient is EKG per my interpretation shows sinus tachycardia.  Patient does he be slightly anxious at this time.  Cardiac troponins negative x2.  D-dimer negative.  Very low suspicion for ACS or PE.  No concern for dissection.  Reviewed chest x-ray results and no evidence of acute process.  Patient does endorse increased stress recently.  Patient treated with Toradol and feels better.  Suspect possible musculoskeletal etiology.   Will discharge home.       Final Clinical Impression(s) / ED Diagnoses Final diagnoses:  None    Rx / DC Orders ED Discharge Orders     None         Lacretia Leigh, MD 05/24/21 1800    Lacretia Leigh, MD 05/24/21 1800

## 2021-05-24 NOTE — ED Triage Notes (Signed)
Pt c/o chest pain that radiates to her back and down L arm. Pt reports associated ShOB and cough. Recent hx of URI.

## 2021-05-24 NOTE — ED Notes (Signed)
Dc instructions reviewed with patient. Patient voiced understanding. Dc with belongings.  °

## 2021-05-26 ENCOUNTER — Other Ambulatory Visit: Payer: Self-pay

## 2021-05-26 ENCOUNTER — Other Ambulatory Visit: Payer: Self-pay | Admitting: Pharmacist

## 2021-05-26 MED ORDER — "INSULIN SYRINGE-NEEDLE U-100 31G X 5/16"" 1 ML MISC"
3 refills | Status: DC
  Filled 2021-05-26 – 2021-08-22 (×2): qty 100, 50d supply, fill #0
  Filled 2021-11-10: qty 100, 50d supply, fill #1

## 2021-05-27 ENCOUNTER — Encounter (HOSPITAL_COMMUNITY): Payer: No Payment, Other | Admitting: Physician Assistant

## 2021-06-02 ENCOUNTER — Other Ambulatory Visit: Payer: Self-pay

## 2021-06-03 ENCOUNTER — Other Ambulatory Visit: Payer: Self-pay

## 2021-06-09 ENCOUNTER — Other Ambulatory Visit: Payer: Self-pay

## 2021-06-10 ENCOUNTER — Other Ambulatory Visit: Payer: Self-pay

## 2021-06-11 ENCOUNTER — Other Ambulatory Visit: Payer: Self-pay

## 2021-06-16 ENCOUNTER — Ambulatory Visit: Payer: Self-pay | Admitting: *Deleted

## 2021-06-16 ENCOUNTER — Other Ambulatory Visit: Payer: Self-pay

## 2021-06-16 ENCOUNTER — Encounter: Payer: Self-pay | Admitting: Critical Care Medicine

## 2021-06-16 ENCOUNTER — Ambulatory Visit: Payer: Self-pay | Attending: Critical Care Medicine | Admitting: Critical Care Medicine

## 2021-06-16 VITALS — BP 112/77 | HR 72 | Resp 16 | Wt 211.2 lb

## 2021-06-16 DIAGNOSIS — F3181 Bipolar II disorder: Secondary | ICD-10-CM

## 2021-06-16 DIAGNOSIS — E1169 Type 2 diabetes mellitus with other specified complication: Secondary | ICD-10-CM

## 2021-06-16 DIAGNOSIS — E119 Type 2 diabetes mellitus without complications: Secondary | ICD-10-CM

## 2021-06-16 DIAGNOSIS — G4710 Hypersomnia, unspecified: Secondary | ICD-10-CM | POA: Insufficient documentation

## 2021-06-16 DIAGNOSIS — R079 Chest pain, unspecified: Secondary | ICD-10-CM

## 2021-06-16 DIAGNOSIS — E1142 Type 2 diabetes mellitus with diabetic polyneuropathy: Secondary | ICD-10-CM

## 2021-06-16 DIAGNOSIS — Z794 Long term (current) use of insulin: Secondary | ICD-10-CM

## 2021-06-16 DIAGNOSIS — Z1159 Encounter for screening for other viral diseases: Secondary | ICD-10-CM

## 2021-06-16 DIAGNOSIS — Z114 Encounter for screening for human immunodeficiency virus [HIV]: Secondary | ICD-10-CM

## 2021-06-16 LAB — GLUCOSE, POCT (MANUAL RESULT ENTRY): POC Glucose: 307 mg/dl — AB (ref 70–99)

## 2021-06-16 MED ORDER — TRULICITY 1.5 MG/0.5ML ~~LOC~~ SOAJ
1.5000 mg | SUBCUTANEOUS | 2 refills | Status: DC
  Filled 2021-06-16 – 2021-06-23 (×2): qty 2, 28d supply, fill #0

## 2021-06-16 MED ORDER — INSULIN GLARGINE 100 UNIT/ML ~~LOC~~ SOLN
SUBCUTANEOUS | 11 refills | Status: DC
  Filled 2021-06-16: qty 20, 25d supply, fill #0
  Filled 2021-08-01: qty 20, 25d supply, fill #1
  Filled 2021-08-01: qty 10, 12d supply, fill #1
  Filled 2021-08-22: qty 10, 12d supply, fill #2
  Filled 2021-09-11: qty 80, 100d supply, fill #3

## 2021-06-16 MED ORDER — METFORMIN HCL ER 500 MG PO TB24
1000.0000 mg | ORAL_TABLET | Freq: Every day | ORAL | 4 refills | Status: DC
  Filled 2021-06-16: qty 60, 30d supply, fill #0

## 2021-06-16 MED ORDER — METFORMIN HCL ER 500 MG PO TB24
ORAL_TABLET | ORAL | 4 refills | Status: DC
Start: 2021-06-16 — End: 2022-01-01
  Filled 2021-06-16: qty 60, 30d supply, fill #0
  Filled 2021-11-10: qty 60, 30d supply, fill #1

## 2021-06-16 MED ORDER — INSULIN GLARGINE 100 UNIT/ML ~~LOC~~ SOLN
SUBCUTANEOUS | 11 refills | Status: DC
  Filled 2021-06-16: qty 20, 28d supply, fill #0

## 2021-06-16 NOTE — Progress Notes (Signed)
Established Patient Office Visit  Subjective:  Patient ID: Danielle Barker, female    DOB: [birth year]  Age: 46 y.o. MRN: [id number]  CC:  Chief Complaint  Patient presents with   Fatigue   Chest Pain   Diabetes    HPI Danielle Barker presents for shortness of breath, weakness episodes. Was in ED 1/28 for chest pain neg w/u given toradol with relief. Sees [facility] for GAD and bipolar 2  This is a primary care patient of Dr. Margarita Rana who saw her previously in the December.  Patient simply saw [facility] clinical pharmacy.  Patient states her blood sugars recently been over 300.  She normally takes 50 units of Lantus but recently has been increasing it to 70 units.  She often skips her NovoLog or takes anywhere from 6 to 10 units but takes it after eating.  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  She tends to eat their food.  She skips breakfast and will have salad or chicken sandwich for lunch and a hamburger [place] fries salads for dinner the salads tend to have significant sugar contents.  Patient notes significant fatigue and also chest pain without radiation.  She was in the emergency room end of January for chest pain syndrome work-up was negative.  She has had a cardiology evaluation previously when she lived in [place] before she moved here.  She does not have insurance at this time.  Blood pressure on arrival is 112/77.   Below is an assessment from her primary care in December. Saw [place]lin 03/2021 1. Type 2 diabetes mellitus with other specified complication, with long-term current use of insulin (HCC) Uncontrolled with A1c of 9.2; goal is less than 7.0 Discontinue glimepiride Trulicity added to regimen Metformin dose increased from 500 mg twice daily to 1000 mg twice daily Will switch from Novolin 70/30 to Lantus She will follow-up with the clinical pharmacist to review her blood sugar log at her next visit.  She will need up titration of her GLP-1 agonist in 1 month to maximize  benefit Counseled on Diabetic diet, my plate method, 474 minutes of moderate intensity exercise/week Blood sugar logs with fasting goals of 80-120 mg/dl, random of less than 180 and in the event of sugars less than 60 mg/dl or greater than 400 mg/dl encouraged to notify the clinic. Advised on the need for annual eye exams, annual foot exams, Pneumonia vaccine. - POCT glucose (manual entry) - POCT glycosylated hemoglobin (Hb A1C) - Dulaglutide (TRULICITY) 2.59 DG/3.8VF SOPN; Inject 0.75 mg into the skin once a week.  Dispense: 2 mL; Refill: 1 - insulin glargine (LANTUS) 100 UNIT/ML injection; Inject 0.5 mLs (50 Units total) into the skin daily.  Dispense: 10 mL; Refill: 11 - metFORMIN (GLUCOPHAGE) 1000 MG tablet; Take 1 tablet (1,000 mg total) by mouth 2 (two) times daily with a meal.  Dispense: 120 tablet; Refill: 3 - insulin aspart (NOVOLOG FLEXPEN) 100 UNIT/ML FlexPen; Inject 0-12 Units into the skin 3 (three) times daily with meals. As per sliding scale  Dispense: 15 mL; Refill: 11 - glucose blood (TRUE METRIX BLOOD GLUCOSE TEST) test strip; Use 1 test strip to check blood glucose 3 times daily  Dispense: 100 each; Refill: 12 - Blood Glucose Monitoring Suppl (TRUE METRIX METER) w/Device KIT; use kit to check blood glucose 3 three times daily before meals.  Dispense: 1 kit; Refill: 0 - TRUEplus Lancets 28G MISC; use 1 lancet to check blood glucose 3 times daily before meals.  Dispense: 100 each; Refill: 12   2. Diabetic polyneuropathy associated with type 2 diabetes mellitus (HCC) Uncontrolled Gabapentin dose has been increased Advised that glycemic control will bring about improvement   3. Screening for viral disease - HCV Ab w Reflex to Quant PCR - HIV Antibody (routine testing w rflx)   4. Screening for colon cancer - Fecal occult blood, imunochemical   5. Bipolar 2 disorder (HCC) Continue Abilify and other psychotropic medications per mental health   6. Generalized anxiety disorder  with panic attacks As per mental health - gabapentin (NEURONTIN) 300 MG capsule; Take 2 capsules (600 mg total) by mouth 3 (three) times daily.  Dispense: 180 capsule; Refill: 3   7. Hyperlipidemia associated with type 2 diabetes mellitus ([place]) Currently on Crestor Low-cholesterol diet Will check lipid panel at next visit   8. Pedal edema Dependent edema Use compression stockings Refill HCTZ - hydrochlorothiazide (HYDRODIURIL) 12.5 MG tablet; Take 1 tablet (12.5 mg total) by mouth daily for 5 days.  Dispense: 30 tablet; Refill: 1   9. Need for immunization against influenza - Flu Vaccine QUAD 16mo+IM (Fluarix, Fluzone & Alfiuria Quad PF)   Return in about 2 weeks (around 04/14/2021) for Blood sugar evaluation with [name]; 3 months with PCP-diabetes.  Below is the assessment from clinical pharmacy later in December. Saw [name]: 04/17/21 Home fasting blood sugars: 100s-180s  2 hour post-meal/random blood sugars: mostly in the 230s.   A/P: Diabetes longstanding currently uncontrolled. Patient is able to verbalize appropriate hypoglycemia management plan. Medication adherence appears appropriate. -Increased dose of Trulicity to 1.5 mg weekly.  -Continue metformin, Lantus, and Novolog at current doses.  -Precaution given to hold Novolog if pre-meal CBG is <150.  -Extensively discussed pathophysiology of diabetes, recommended lifestyle interventions, dietary effects on blood sugar control -Counseled on s/sx of and management of hypoglycemia -Next A1C anticipated 06/2021.       Past Medical History:  Diagnosis Date   Diabetes mellitus without complication (HCC)    High cholesterol    High triglycerides    Mood disorder (HCC)     Past Surgical History:  Procedure Laterality Date   APPENDECTOMY     CHOLECYSTECTOMY     SHOULDER SURGERY Left    TONSILLECTOMY      Family History  Problem Relation Age of Onset   Cancer Mother    Heart failure Mother    Cancer Father      Social History   Socioeconomic History   Marital status: Divorced    Spouse name: Not on file   Number of children: Not on file   Years of education: Not on file   Highest education level: Not on file  Occupational History   Not on file  Tobacco Use   Smoking status: Never   Smokeless tobacco: Never  Vaping Use   Vaping Use: Never used  Substance and Sexual Activity   Alcohol use: Yes    Comment: rarely   Drug use: Never   Sexual activity: Yes  Other Topics Concern   Not on file  Social History Narrative   Not on file   Social Determinants of Health   Financial Resource Strain: Not on file  Food Insecurity: Not on file  Transportation Needs: Not on file  Physical Activity: Not on file  Stress: Not on file  Social Connections: Not on file  Intimate Partner Violence: Not on file    Outpatient Medications Prior to Visit  Medication Sig Dispense Refill   ARIPiprazole (  ABILIFY) 30 MG tablet Take 1 tablet (30 mg total) by mouth daily. 30 tablet 1   benzonatate (TESSALON) 200 MG capsule Take 1 capsule (200 mg total) by mouth 3 (three) times daily as needed for cough. 20 capsule 0   Blood Glucose Monitoring Suppl (TRUE METRIX METER) w/Device KIT use kit to check blood glucose 3 three times daily before meals. 1 kit 0   clonazePAM (KLONOPIN) 1 MG tablet Take 1 tablet (1 mg total) by mouth 3 (three) times daily as needed for anxiety. 90 tablet 0   dicyclomine (BENTYL) 20 MG tablet Take 1 tablet (20 mg total) by mouth 2 (two) times daily. 20 tablet 0   escitalopram (LEXAPRO) 20 MG tablet Take 2 tablets (40 mg total) by mouth daily. 60 tablet 1   furosemide (LASIX) 40 MG tablet Take 1 tablet (40 mg total) by mouth 2 (two) times daily as needed. 15 tablet 0   gabapentin (NEURONTIN) 300 MG capsule Take 2 capsules (600 mg total) by mouth 3 (three) times daily. 180 capsule 3   glucose blood (TRUE METRIX BLOOD GLUCOSE TEST) test strip Use 1 test strip to check blood glucose 3 times  daily 100 each 12   insulin aspart (NOVOLOG FLEXPEN) 100 UNIT/ML FlexPen Inject 0-12 Units into the skin 3 (three) times daily with meals. As per sliding scale 15 mL 11   Insulin Syringe-Needle U-100 (TRUEPLUS INSULIN SYRINGE) 31G X 5/16" 1 ML MISC Use to inject Lantus twice a day. 100 each 3   naproxen (NAPROSYN) 500 MG tablet Take 1 tablet (500 mg total) by mouth 2 (two) times daily. 30 tablet 0   TRUEplus Lancets 28G MISC use 1 lancet to check blood glucose 3 times daily before meals. 100 each 12   doxycycline (VIBRA-TABS) 100 MG tablet Take 1 tablet (100 mg total) by mouth 2 (two) times daily. 20 tablet 0   Dulaglutide (TRULICITY) 1.5 VP/7.1GG SOPN Inject 1.5 mg into the skin once a week. 2 mL 2   insulin glargine (LANTUS) 100 UNIT/ML injection Inject 0.5 mLs (50 Units total) into the skin daily. 10 mL 11   metFORMIN (GLUCOPHAGE) 1000 MG tablet Take 1 tablet (1,000 mg total) by mouth 2 (two) times daily with a meal. 120 tablet 3   fenofibrate (TRICOR) 48 MG tablet Take 1 tablet (48 mg total) by mouth daily. 30 tablet 2   hydrochlorothiazide (HYDRODIURIL) 12.5 MG tablet Take 1 tablet (12.5 mg total) by mouth daily for 5 days. 30 tablet 1   rosuvastatin (CRESTOR) 20 MG tablet Take 1 tablet (20 mg total) by mouth daily for 30 doses. 30 tablet 2   No facility-administered medications prior to visit.    Allergies  Allergen Reactions   Compazine [Prochlorperazine]    Dilaudid [Hydromorphone]     intolerence   Reglan [Metoclopramide]    Zofran [Ondansetron]     ROS Review of Systems  Constitutional:  Positive for fatigue. Negative for chills, diaphoresis and fever.  HENT:  Negative for congestion, hearing loss, nosebleeds, sore throat and tinnitus.        Snores  Eyes:  Negative for photophobia and redness.  Respiratory:  Positive for apnea and shortness of breath. Negative for cough, wheezing and stridor.        Doe   Cardiovascular:  Positive for chest pain. Negative for palpitations  and leg swelling.  Gastrointestinal:  Negative for abdominal pain, blood in stool, constipation, diarrhea, nausea and vomiting.  Endocrine: Negative for polydipsia.  Genitourinary:  Negative for dysuria, flank pain, frequency, hematuria and urgency.  Musculoskeletal:  Negative for back pain, myalgias and neck pain.  Skin:  Negative for rash.  Allergic/Immunologic: Negative for environmental allergies.  Neurological:  Negative for dizziness, tremors, seizures, weakness and headaches.  Hematological:  Does not bruise/bleed easily.  Psychiatric/Behavioral:  Negative for suicidal ideas. The patient is not nervous/anxious.      Objective:    Physical Exam Vitals reviewed.  Constitutional:      Appearance: Normal appearance. She is well-developed. She is obese. She is not diaphoretic.  HENT:     Head: Normocephalic and atraumatic.     Nose: No nasal deformity, septal deviation, mucosal edema or rhinorrhea.     Right Sinus: No maxillary sinus tenderness or frontal sinus tenderness.     Left Sinus: No maxillary sinus tenderness or frontal sinus tenderness.     Mouth/Throat:     Pharynx: No oropharyngeal exudate.  Eyes:     General: No scleral icterus.    Conjunctiva/sclera: Conjunctivae normal.     Pupils: Pupils are equal, round, and reactive to light.  Neck:     Thyroid: No thyromegaly.     Vascular: No carotid bruit or JVD.     Trachea: Trachea normal. No tracheal tenderness or tracheal deviation.  Cardiovascular:     Rate and Rhythm: Normal rate and regular rhythm.     Chest Wall: PMI is not displaced.     Pulses: Normal pulses. No decreased pulses.     Heart sounds: Normal heart sounds, S1 normal and S2 normal. Heart sounds not distant. No murmur heard. No systolic murmur is present.  No diastolic murmur is present.    No friction rub. No gallop. No S3 or S4 sounds.  Pulmonary:     Effort: Pulmonary effort is normal. No tachypnea, accessory muscle usage or respiratory distress.      Breath sounds: No stridor. No decreased breath sounds, wheezing, rhonchi or rales.  Chest:     Chest wall: No tenderness.  Abdominal:     General: Bowel sounds are normal. There is no distension.     Palpations: Abdomen is soft. Abdomen is not rigid.     Tenderness: There is no abdominal tenderness. There is no guarding or rebound.  Musculoskeletal:        General: Normal range of motion.     Cervical back: Normal range of motion and neck supple. No edema, erythema or rigidity. No muscular tenderness. Normal range of motion.  Lymphadenopathy:     Head:     Right side of head: No submental or submandibular adenopathy.     Left side of head: No submental or submandibular adenopathy.     Cervical: No cervical adenopathy.  Skin:    General: Skin is warm and dry.     Coloration: Skin is not pale.     Findings: No rash.     Nails: There is no clubbing.  Neurological:     Mental Status: She is alert and oriented to person, place, and time.     Sensory: No sensory deficit.  Psychiatric:        Speech: Speech normal.        Behavior: Behavior normal.    BP 112/77    Pulse 72    Resp 16    Wt 211 lb 3.2 oz (95.8 kg)    SpO2 96%    BMI 35.15 kg/m  Wt Readings from Last 3 Encounters:  06/16/21 211 lb  3.2 oz (95.8 kg)  05/24/21 209 lb (94.8 kg)  04/30/21 115 lb (52.2 kg)   Results of the Epworth flowsheet 06/16/2021  Sitting and reading 3  Watching TV 3  Sitting, inactive in a public place (e.g. a theatre or a meeting) 3  As a passenger in a car for an hour without a break 3  Lying down to rest in the afternoon when circumstances permit 3  Sitting and talking to someone 0  Sitting quietly after a lunch without alcohol 1  In a car, while stopped for a few minutes in traffic 3  Total score 19     Health Maintenance Due  Topic Date Due   COVID-19 Vaccine (1) Never done   OPHTHALMOLOGY EXAM  Never done   URINE MICROALBUMIN  Never done   TETANUS/TDAP  Never done   PAP  SMEAR-Modifier  Never done   COLONOSCOPY (Pts 45-90yrs Insurance coverage will need to be confirmed)  Never done    There are no preventive care reminders to display for this patient.  Lab Results  Component Value Date   TSH 3.727 03/05/2021   Lab Results  Component Value Date   WBC 7.7 05/24/2021   HGB 13.3 05/24/2021   HCT 39.6 05/24/2021   MCV 89.4 05/24/2021   PLT 355 05/24/2021   Lab Results  Component Value Date   NA 133 (L) 05/24/2021   K 4.3 05/24/2021   CO2 26 05/24/2021   GLUCOSE 244 (H) 05/24/2021   BUN 14 05/24/2021   CREATININE 0.72 05/24/2021   BILITOT 0.4 03/05/2021   ALKPHOS 64 03/05/2021   AST 13 (L) 03/05/2021   ALT 18 03/05/2021   PROT 6.7 03/05/2021   ALBUMIN 3.8 03/05/2021   CALCIUM 8.9 05/24/2021   ANIONGAP 8 05/24/2021   No results found for: CHOL No results found for: HDL No results found for: LDLCALC No results found for: TRIG No results found for: CHOLHDL Lab Results  Component Value Date   HGBA1C 9.2 (A) 03/31/2021      Assessment & Plan:   Problem List Items Addressed This Visit       Endocrine   Diabetes mellitus without complication ([place]) - Primary   Relevant Medications   Dulaglutide (TRULICITY) 1.5 PT/4.6FK SOPN   insulin glargine (LANTUS) 100 UNIT/ML injection   metFORMIN (GLUCOPHAGE-XR) 500 MG 24 hr tablet   Other Relevant Orders   Microalbumin / creatinine urine ratio   POCT glucose (manual entry) (Completed)   Comprehensive metabolic panel   Ambulatory referral to Cardiology   Diabetic polyneuropathy associated with type 2 diabetes mellitus (HCC)   Relevant Medications   Dulaglutide (TRULICITY) 1.5 CL/2.7NT SOPN   insulin glargine (LANTUS) 100 UNIT/ML injection   metFORMIN (GLUCOPHAGE-XR) 500 MG 24 hr tablet     Other   Bipolar 2 disorder ([place])   Other Visit Diagnoses     Need for hepatitis C screening test       Relevant Orders   HCV Ab w Reflex to Quant PCR   Encounter for screening for HIV        Relevant Orders   HIV Antibody (routine testing w rflx)   Type 2 diabetes mellitus with other specified complication, with long-term current use of insulin (HCC)       Relevant Medications   Dulaglutide (TRULICITY) 1.5 ZG/0.1VC SOPN   insulin glargine (LANTUS) 100 UNIT/ML injection   metFORMIN (GLUCOPHAGE-XR) 500 MG 24 hr tablet   Chest pain, unspecified type  Relevant Orders   Ambulatory referral to Cardiology       Meds ordered this encounter  Medications   DISCONTD: insulin glargine (LANTUS) 100 UNIT/ML injection    Sig: Inject 40 units in AM and 30 units in PM    Dispense:  10 mL    Refill:  11   Dulaglutide (TRULICITY) 1.5 YB/6.3SL SOPN    Sig: Inject 1.5 mg into the skin once a week.    Dispense:  2 mL    Refill:  2   DISCONTD: metFORMIN (GLUCOPHAGE-XR) 500 MG 24 hr tablet    Sig: Take 2 tablets (1,000 mg total) by mouth daily with breakfast.    Dispense:  60 tablet    Refill:  4   insulin glargine (LANTUS) 100 UNIT/ML injection    Sig: Inject 40 units in AM and 40 units in PM    Dispense:  10 mL    Refill:  11    Disregard last order   metFORMIN (GLUCOPHAGE-XR) 500 MG 24 hr tablet    Sig: Take one daily with food for one week then two daily with food    Dispense:  60 tablet    Refill:  4   I spent 38 minutes with this patient partnering with clinical pharmacy high degree of complexity decision making Follow-up: Keep follow-up first week in March with primary care provider   Asencion Noble, MD

## 2021-06-16 NOTE — Patient Instructions (Addendum)
Change insulin glargine to 40 units in AM and 40 units in PM  Change metformin to Metformin XR 500 mg daily with breakfast for one week then increase to two daily with food  STay on trulicity and insulin lispro as you are taking  Get orange card application  A cardiology referral will be made  You likely have sleep apnea, obtain orange card then we can obtain a sleep study  Follow healthy diet as we discussed  See [name] our pharmacist in 3 weeks and DR Alvis Lemmings in two months  Stop by lab for screening labs HIV Hepatitis C and metabolic panel

## 2021-06-16 NOTE — Assessment & Plan Note (Signed)
Patient with significant chest pain syndrome I am concerned about a cardiac issue she will be referred to cardiology evaluation given the severity of her diabetes she is high risk

## 2021-06-16 NOTE — Assessment & Plan Note (Signed)
Poorly controlled diabetes I partnered with clinical pharmacy today.  She has been using her lispro after eating.  She cannot tolerate standard metformin for nausea.  Plan is to begin metformin XR 500 mg daily for a week then increase to 1000 mg daily on the XR version.  She will change insulin glargine to 40 units twice daily and resume insulin lispro 10 units 3 times a day but take before eating  We will get her back in short-term follow-up with clinical pharmacy and she has a follow-up appoint with her primary care provider first week of March

## 2021-06-16 NOTE — Telephone Encounter (Signed)
°  Chief Complaint: c/o worsening fatigue, requesting appt ,  Symptoms: fatigue had to leave work early last night, shortness of breath at times with dizziness and chest pain but not now. Has been seen in ED multiple occasions. Diarrhea hx IBS Frequency: approx. 3 weeks  Pertinent Negatives: Patient denies chest pain , difficulty breathing, no dizziness, no N/V, shortness of breath now  Disposition: [] ED /[] Urgent Care (no appt availability in office) / [x] Appointment(In office/virtual)/ []  [facility] [facility]/ [] Home Care/ [] Refused Recommended Disposition /[] [facility]/ []  Follow-up with PCP Additional Notes:   Patient reports she has been told to f/u with PCP.      Reason for Disposition  [1] MODERATE weakness (i.e., interferes with work, school, normal activities) AND [2] cause unknown  (Exceptions: weakness with acute minor illness, or weakness from poor fluid intake)  Answer Assessment - Initial Assessment Questions 1. DESCRIPTION: "Describe how you are feeling."     Fatigue and very tired shortness of breath at times not now  2. SEVERITY: "How bad is it?"  "Can you stand and walk?"   - MILD - Feels weak or tired, but does not interfere with work, school or normal activities   - MODERATE - Able to stand and walk; weakness interferes with work, school, or normal activities   - SEVERE - Unable to stand or walk     Had to leave work early last night due to fatigue  3. ONSET:  "When did the weakness begin?"     Approx 3 weeks  4. CAUSE: "What do you think is causing the weakness?"     Not sure  5. MEDICINES: "Have you recently started a new medicine or had a change in the amount of a medicine?"     No  6. OTHER SYMPTOMS: "Do you have any other symptoms?" (e.g., chest pain, fever, cough, SOB, vomiting, diarrhea, bleeding, other areas of pain)     None now . Lightheaded last night . Hx IBA 7. PREGNANCY: "Is there any chance you are pregnant?" "When was your last  menstrual period?"     na  Protocols used: Weakness (Generalized) and Fatigue-A-AH

## 2021-06-16 NOTE — Assessment & Plan Note (Addendum)
Hypersomnia and excess fatigue  Follow-up the diabetes poorly controlled is not helping I am concerned she may have sleep apnea.  She does not have insurance at this time.  She will plan to have apply for the orange card [place] discount once this is achieved we can consider potentially a home sleep study.  Her Epworth scale is extremely high today at 28

## 2021-06-17 ENCOUNTER — Telehealth: Payer: Self-pay

## 2021-06-17 LAB — COMPREHENSIVE METABOLIC PANEL
ALT: 24 IU/L (ref 0–32)
AST: 16 IU/L (ref 0–40)
Albumin/Globulin Ratio: 1.5 (ref 1.2–2.2)
Albumin: 4.2 g/dL (ref 3.8–4.8)
Alkaline Phosphatase: 117 IU/L (ref 44–121)
BUN/Creatinine Ratio: 17 (ref 9–23)
BUN: 13 mg/dL (ref 6–24)
Bilirubin Total: 0.3 mg/dL (ref 0.0–1.2)
CO2: 24 mmol/L (ref 20–29)
Calcium: 9.2 mg/dL (ref 8.7–10.2)
Chloride: 96 mmol/L (ref 96–106)
Creatinine, Ser: 0.75 mg/dL (ref 0.57–1.00)
Globulin, Total: 2.8 g/dL (ref 1.5–4.5)
Glucose: 275 mg/dL — ABNORMAL HIGH (ref 70–99)
Potassium: 4.3 mmol/L (ref 3.5–5.2)
Sodium: 133 mmol/L — ABNORMAL LOW (ref 134–144)
Total Protein: 7 g/dL (ref 6.0–8.5)
eGFR: 99 mL/min/{1.73_m2} (ref >59)

## 2021-06-17 LAB — HIV ANTIBODY (ROUTINE TESTING W REFLEX): HIV Screen 4th Generation wRfx: NONREACTIVE

## 2021-06-17 LAB — HCV AB W REFLEX TO QUANT PCR: HCV Ab: NONREACTIVE

## 2021-06-17 LAB — HCV INTERPRETATION

## 2021-06-17 NOTE — Telephone Encounter (Signed)
-----   Message from Storm Frisk, MD sent at 06/17/2021  8:19 AM EST ----- Let pt know hiv and hep c NEG.  Kidney liver normal

## 2021-06-17 NOTE — Telephone Encounter (Signed)
Pt was called and is aware of results, DOB was confirmed.  ?

## 2021-06-23 ENCOUNTER — Other Ambulatory Visit (HOSPITAL_COMMUNITY): Payer: Self-pay | Admitting: Physician Assistant

## 2021-06-23 ENCOUNTER — Other Ambulatory Visit: Payer: Self-pay

## 2021-06-23 DIAGNOSIS — F410 Panic disorder [episodic paroxysmal anxiety] without agoraphobia: Secondary | ICD-10-CM

## 2021-06-23 DIAGNOSIS — F3181 Bipolar II disorder: Secondary | ICD-10-CM

## 2021-06-23 DIAGNOSIS — F411 Generalized anxiety disorder: Secondary | ICD-10-CM

## 2021-06-25 ENCOUNTER — Other Ambulatory Visit (HOSPITAL_BASED_OUTPATIENT_CLINIC_OR_DEPARTMENT_OTHER): Payer: Self-pay

## 2021-06-25 ENCOUNTER — Other Ambulatory Visit: Payer: Self-pay

## 2021-06-25 MED ORDER — ESCITALOPRAM OXALATE 20 MG PO TABS
40.0000 mg | ORAL_TABLET | Freq: Every day | ORAL | 1 refills | Status: DC
  Filled 2021-06-25: qty 60, 30d supply, fill #0

## 2021-07-02 ENCOUNTER — Other Ambulatory Visit: Payer: Self-pay

## 2021-07-02 ENCOUNTER — Ambulatory Visit: Payer: Self-pay | Attending: Family Medicine | Admitting: Family Medicine

## 2021-07-02 ENCOUNTER — Encounter: Payer: Self-pay | Admitting: Family Medicine

## 2021-07-02 VITALS — BP 114/76 | HR 73 | Ht 65.0 in | Wt 212.0 lb

## 2021-07-02 DIAGNOSIS — F3181 Bipolar II disorder: Secondary | ICD-10-CM

## 2021-07-02 DIAGNOSIS — E1169 Type 2 diabetes mellitus with other specified complication: Secondary | ICD-10-CM

## 2021-07-02 DIAGNOSIS — E1165 Type 2 diabetes mellitus with hyperglycemia: Secondary | ICD-10-CM

## 2021-07-02 DIAGNOSIS — Z1211 Encounter for screening for malignant neoplasm of colon: Secondary | ICD-10-CM

## 2021-07-02 DIAGNOSIS — Z1231 Encounter for screening mammogram for malignant neoplasm of breast: Secondary | ICD-10-CM

## 2021-07-02 DIAGNOSIS — E785 Hyperlipidemia, unspecified: Secondary | ICD-10-CM

## 2021-07-02 DIAGNOSIS — Z794 Long term (current) use of insulin: Secondary | ICD-10-CM

## 2021-07-02 LAB — POCT GLYCOSYLATED HEMOGLOBIN (HGB A1C): Hemoglobin A1C: 8.4 % — AB (ref 4.0–5.6)

## 2021-07-02 MED ORDER — TRULICITY 3 MG/0.5ML ~~LOC~~ SOAJ
3.0000 mg | SUBCUTANEOUS | 6 refills | Status: DC
  Filled 2021-07-02: qty 2, 28d supply, fill #0

## 2021-07-02 NOTE — Patient Instructions (Signed)

## 2021-07-02 NOTE — Progress Notes (Signed)
Subjective:  Patient ID: Danielle Barker, female    DOB: 11/01/1975  Age: 46 y.o. MRN: [id number]  CC: Follow-up (3 month follow up , pt is not fasting today )   HPI Danielle Barker is a 46 y.o. year old female with a history of bipolar disorder (managed by psych), generalized anxiety disorder, type 2 diabetes mellitus (A1c 8.4), hyperlipidemia   Interval History: She has not been adherent with a Diabetic diet and eats lots of junk foods. She is aware of what she needs to eat. She craves sugars when she has premenstrual syndrome. She had a visit with Dr Joya Gaskins last month and her Lantus regimen was changed from 50 units daily to 40 units twice daily and metformin was changed to extended release due to GI side effects. Sugars range anywhere from 121 - 300.  A1c is 8.4 down from 9.2 previously.  She has not had a recent eye exam but has endorsed some visual changes where she was previously told by the optometrist could be due to age. Does not get much exercise outside of work but plans to incorporate some walking into her lifestyle.  She has not been taking her statin as she forgot to pick up Crestor from the pharmacy.  She endorses a history of hypertriglyceridemia. Doing well on her antipsychotic regimen. Denies acute concerns today. Past Medical History:  Diagnosis Date   Diabetes mellitus without complication (HCC)    High cholesterol    High triglycerides    Mood disorder (HCC)     Past Surgical History:  Procedure Laterality Date   APPENDECTOMY     CHOLECYSTECTOMY     SHOULDER SURGERY Left    TONSILLECTOMY      Family History  Problem Relation Age of Onset   Cancer Mother    Heart failure Mother    Cancer Father     Allergies  Allergen Reactions   Compazine [Prochlorperazine]    Dilaudid [Hydromorphone]     intolerence   Reglan [Metoclopramide]    Zofran [Ondansetron]     Outpatient Medications Prior to Visit  Medication Sig Dispense Refill   ARIPiprazole  (ABILIFY) 30 MG tablet Take 1 tablet (30 mg total) by mouth daily. 30 tablet 1   benzonatate (TESSALON) 200 MG capsule Take 1 capsule (200 mg total) by mouth 3 (three) times daily as needed for cough. 20 capsule 0   Blood Glucose Monitoring Suppl (TRUE METRIX METER) w/Device KIT use kit to check blood glucose 3 three times daily before meals. 1 kit 0   clonazePAM (KLONOPIN) 1 MG tablet Take 1 tablet (1 mg total) by mouth 3 (three) times daily as needed for anxiety. 90 tablet 0   dicyclomine (BENTYL) 20 MG tablet Take 1 tablet (20 mg total) by mouth 2 (two) times daily. 20 tablet 0   escitalopram (LEXAPRO) 20 MG tablet Take 2 tablets (40 mg total) by mouth daily. 60 tablet 1   furosemide (LASIX) 40 MG tablet Take 1 tablet (40 mg total) by mouth 2 (two) times daily as needed. 15 tablet 0   gabapentin (NEURONTIN) 300 MG capsule Take 2 capsules (600 mg total) by mouth 3 (three) times daily. 180 capsule 3   glucose blood (TRUE METRIX BLOOD GLUCOSE TEST) test strip Use 1 test strip to check blood glucose 3 times daily 100 each 12   insulin aspart (NOVOLOG FLEXPEN) 100 UNIT/ML FlexPen Inject 0-12 Units into the skin 3 (three) times daily with meals. As per sliding scale  15 mL 11   insulin glargine (LANTUS) 100 UNIT/ML injection Inject 40 units in AM and 40 units in PM 10 mL 11   Insulin Syringe-Needle U-100 (TRUEPLUS INSULIN SYRINGE) 31G X 5/16" 1 ML MISC Use to inject Lantus twice a day. 100 each 3   metFORMIN (GLUCOPHAGE-XR) 500 MG 24 hr tablet Take one daily with food for one week then two daily with food 60 tablet 4   naproxen (NAPROSYN) 500 MG tablet Take 1 tablet (500 mg total) by mouth 2 (two) times daily. 30 tablet 0   TRUEplus Lancets 28G MISC use 1 lancet to check blood glucose 3 times daily before meals. 100 each 12   Dulaglutide (TRULICITY) 1.5 OE/4.2PN SOPN Inject 1.5 mg into the skin once a week. 2 mL 2   fenofibrate (TRICOR) 48 MG tablet Take 1 tablet (48 mg total) by mouth daily. 30 tablet 2    hydrochlorothiazide (HYDRODIURIL) 12.5 MG tablet Take 1 tablet (12.5 mg total) by mouth daily for 5 days. 30 tablet 1   rosuvastatin (CRESTOR) 20 MG tablet Take 1 tablet (20 mg total) by mouth daily for 30 doses. 30 tablet 2   No facility-administered medications prior to visit.     ROS Review of Systems  Constitutional:  Negative for activity change, appetite change and fatigue.  HENT:  Negative for congestion, sinus pressure and sore throat.   Eyes:  Negative for visual disturbance.  Respiratory:  Negative for cough, chest tightness, shortness of breath and wheezing.   Cardiovascular:  Negative for chest pain and palpitations.  Gastrointestinal:  Negative for abdominal distention, abdominal pain and constipation.  Endocrine: Negative for polydipsia.  Genitourinary:  Negative for dysuria and frequency.  Musculoskeletal:  Negative for arthralgias and back pain.  Skin:  Negative for rash.  Neurological:  Negative for tremors, light-headedness and numbness.  Hematological:  Does not bruise/bleed easily.  Psychiatric/Behavioral:  Negative for agitation and behavioral problems.    Objective:  BP 114/76    Pulse 73    Ht _0  (1.651 m)    Wt 212 lb (96.2 kg)    SpO2 100%    BMI 35.28 kg/m   BP/Weight 07/02/2021 06/16/2021 3/61/4431  Systolic BP 540 086 761  Diastolic BP 76 77 79  Wt. (Lbs) 212 211.2 209  BMI 35.28 35.15 34.78  Some encounter information is confidential and restricted. Go to Review Flowsheets activity to see all data.      Physical Exam Constitutional:      Appearance: She is well-developed.  Cardiovascular:     Rate and Rhythm: Normal rate.     Heart sounds: Normal heart sounds. No murmur heard. Pulmonary:     Effort: Pulmonary effort is normal.     Breath sounds: Normal breath sounds. No wheezing or rales.  Chest:     Chest wall: No tenderness.  Abdominal:     General: Bowel sounds are normal. There is no distension.     Palpations: Abdomen is soft.  There is no mass.     Tenderness: There is no abdominal tenderness.  Musculoskeletal:        General: Normal range of motion.     Right lower leg: No edema.     Left lower leg: No edema.  Neurological:     Mental Status: She is alert and oriented to person, place, and time.  Psychiatric:        Mood and Affect: Mood normal.    CMP Latest Ref Rng &  Units 06/16/2021 05/24/2021 03/05/2021  Glucose 70 - 99 mg/dL 275(H) 244(H) 205(H)  BUN 6 - 24 mg/dL _0 Creatinine 0.57 - 1.00 mg/dL 0.75 0.72 0.65  Sodium 134 - 144 mmol/L 133(L) 133(L) 135  Potassium 3.5 - 5.2 mmol/L 4.3 4.3 3.9  Chloride 96 - 106 mmol/L 96 99 100  CO2 20 - 29 mmol/L _1 Calcium 8.7 - 10.2 mg/dL 9.2 8.9 9.2  Total Protein 6.0 - 8.5 g/dL 7.0 - 6.7  Total Bilirubin 0.0 - 1.2 mg/dL 0.3 - 0.4  Alkaline Phos 44 - 121 IU/L 117 - 64  AST 0 - 40 IU/L 16 - 13(L)  ALT 0 - 32 IU/L 24 - 18    Lipid Panel  No results found for: CHOL, TRIG, HDL, CHOLHDL, VLDL, LDLCALC, LDLDIRECT  CBC    Component Value Date/Time   WBC 7.7 05/24/2021 1406   RBC 4.43 05/24/2021 1406   HGB 13.3 05/24/2021 1406   HCT 39.6 05/24/2021 1406   PLT 355 05/24/2021 1406   MCV 89.4 05/24/2021 1406   MCH 30.0 05/24/2021 1406   MCHC 33.6 05/24/2021 1406   RDW 12.6 05/24/2021 1406   LYMPHSABS 2.0 03/05/2021 1650   MONOABS 0.6 03/05/2021 1650   EOSABS 0.1 03/05/2021 1650   BASOSABS 0.0 03/05/2021 1650    Lab Results  Component Value Date   HGBA1C 8.4 (A) 07/02/2021    Assessment & Plan:  1. Type 2 diabetes mellitus with hyperglycemia, with long-term current use of insulin (HCC) Uncontrolled with A1c of 8.4 which has improved from 9.2 previously; goal is less than 7.0 Increase Trulicity from 1.5 to 3 mg Strongly encouraged to adhere to a diabetic diet and we have provided educational materials to assist with this Counseled on Diabetic diet, my plate method, 453 minutes of moderate intensity exercise/week Blood sugar logs with  fasting goals of 80-120 mg/dl, random of less than 180 and in the event of sugars less than 60 mg/dl or greater than 400 mg/dl encouraged to notify the clinic. Advised on the need for annual eye exams, annual foot exams, Pneumonia vaccine. - HgB A1c - Dulaglutide (TRULICITY) 3 MI/6.8EH SOPN; Inject 3 mg as directed once a week.  Dispense: 2 mL; Refill: 6  2. Encounter for screening mammogram for malignant neoplasm of breast - MM 3D SCREEN BREAST BILATERAL; Future  3. Screening for colon cancer - Fecal occult blood, imunochemical([facility])  4. Bipolar 2 disorder ([place]) Controlled Management as per mental health  5. Hyperlipidemia associated with type 2 diabetes mellitus ([place]) Likely to be uncontrolled due to nonadherence with statin She will pick up her Crestor and fenofibrate from the pharmacy today [place] Maintenance: states she had a PAP smear within the last 6 months at the health dept.  She will obtain a copy of the report for [place]. Meds ordered this encounter  Medications   Dulaglutide (TRULICITY) 3 OZ/2.2QM SOPN    Sig: Inject 3 mg as directed once a week.    Dispense:  2 mL    Refill:  6    Dose increase    Follow-up: Return in about 3 months (around 10/02/2021) for Chronic medical conditions.       Charlott Rakes, MD, FAAFP. [facility] [place], [place]   07/02/2021, 9:02 AM

## 2021-07-03 ENCOUNTER — Other Ambulatory Visit: Payer: Self-pay

## 2021-07-04 ENCOUNTER — Other Ambulatory Visit: Payer: Self-pay

## 2021-07-07 ENCOUNTER — Other Ambulatory Visit: Payer: Self-pay

## 2021-07-07 ENCOUNTER — Telehealth: Payer: Self-pay | Admitting: Pharmacist

## 2021-07-07 MED ORDER — SEMAGLUTIDE (1 MG/DOSE) 4 MG/3ML ~~LOC~~ SOPN
1.0000 mg | PEN_INJECTOR | SUBCUTANEOUS | 3 refills | Status: DC
  Filled 2021-07-07: qty 3, 28d supply, fill #0
  Filled 2021-08-30: qty 3, 28d supply, fill #1
  Filled 2021-09-09: qty 9, 84d supply, fill #1

## 2021-07-07 NOTE — Telephone Encounter (Signed)
Dr. Alvis Lemmings,  ? ?Patient was seen in clinic last week. Trulicity dose was increased but we cannot get this via patient assistance at this time. Pharmacy is requesting change to Ozempic. Pt is aware and has signed a patient assistance form for Ozempic for [place] to have on file if you are okay with this switch. Therefore, will be able to get approval faster. If appropriate, can you send in a rx for Ozempic? ?

## 2021-07-07 NOTE — Telephone Encounter (Signed)
Done

## 2021-07-07 NOTE — Addendum Note (Signed)
Addended by: Hoy Register on: 07/07/2021 03:33 PM ? ? Modules accepted: Orders ? ?

## 2021-07-09 ENCOUNTER — Other Ambulatory Visit: Payer: Self-pay

## 2021-07-10 ENCOUNTER — Other Ambulatory Visit: Payer: Self-pay

## 2021-07-14 ENCOUNTER — Other Ambulatory Visit: Payer: Self-pay

## 2021-07-15 ENCOUNTER — Other Ambulatory Visit: Payer: Self-pay

## 2021-07-15 ENCOUNTER — Ambulatory Visit (INDEPENDENT_AMBULATORY_CARE_PROVIDER_SITE_OTHER): Payer: No Payment, Other | Admitting: Physician Assistant

## 2021-07-15 ENCOUNTER — Encounter (HOSPITAL_COMMUNITY): Payer: Self-pay | Admitting: Physician Assistant

## 2021-07-15 DIAGNOSIS — F410 Panic disorder [episodic paroxysmal anxiety] without agoraphobia: Secondary | ICD-10-CM

## 2021-07-15 DIAGNOSIS — F411 Generalized anxiety disorder: Secondary | ICD-10-CM

## 2021-07-15 DIAGNOSIS — F3181 Bipolar II disorder: Secondary | ICD-10-CM | POA: Diagnosis not present

## 2021-07-15 MED ORDER — LAMOTRIGINE 25 MG PO TABS
25.0000 mg | ORAL_TABLET | Freq: Every day | ORAL | 0 refills | Status: DC
  Filled 2021-07-15: qty 14, 14d supply, fill #0

## 2021-07-15 MED ORDER — ESCITALOPRAM OXALATE 20 MG PO TABS
40.0000 mg | ORAL_TABLET | Freq: Every day | ORAL | 1 refills | Status: DC
  Filled 2021-07-15 – 2021-08-05 (×2): qty 60, 30d supply, fill #0

## 2021-07-15 MED ORDER — LAMOTRIGINE 25 MG PO TABS
50.0000 mg | ORAL_TABLET | Freq: Every day | ORAL | 1 refills | Status: DC
  Filled 2021-07-15: qty 30, 15d supply, fill #0
  Filled 2021-08-15: qty 15, 7d supply, fill #1
  Filled 2021-08-22: qty 30, 15d supply, fill #1

## 2021-07-15 MED ORDER — ARIPIPRAZOLE 30 MG PO TABS
30.0000 mg | ORAL_TABLET | Freq: Every day | ORAL | 1 refills | Status: DC
  Filled 2021-07-15: qty 30, 30d supply, fill #0
  Filled 2021-08-05: qty 30, 30d supply, fill #1

## 2021-07-15 MED ORDER — CLONAZEPAM 1 MG PO TABS: 1.0000 mg | ORAL_TABLET | Freq: Three times a day (TID) | ORAL | 1 refills | Status: DC | PRN

## 2021-07-15 NOTE — Progress Notes (Signed)
BH MD/PA/NP OP Progress Note ? ?07/18/2021 9:48 AM ?Sigmund Hazel Dech  ?MRN:  [id number] ? ?Chief Complaint:  ?Chief Complaint  ?Patient presents with  ? Medication Management  ?  in person, f/u mm  ? ?HPI:  ? ?Danielle Barker is a 46 year old female with a past psychiatric history significant for generalized anxiety disorder with panic attacks and bipolar 2 disorder who presents to [facility] for follow-up and medication management.  Patient is currently being managed on the following medications: ? ?Clonazepam 1 mg 2 times daily as needed ?Abilify 30 mg daily ?Escitalopram (Lexapro 40 mg daily ? ?Patient reports that she has been experiencing depression and anxiety that she described as situational in nature.  Patient endorses lack of motivation and being really sleepy.  Patient reports that she was recommended a sleep study by her provider but has no insurance in order to conduct set sleep study.  Patient also endorses lack of drive and motivation which she attributes to working a lot.  Patient reports that she has been on a variety of medications for the management of her depressive spells.  Patient believes that her symptoms may be related to stressors in her life impacting her bipolar diagnosis. ? ?Patient rates her anxiety at 9 out of 10.  Patient notes that she has also found herself being nervous around people.  She states that she has been having a lot of paranoia related to the possibility of a mass shooting in her place of work.  Patient's current stressors include work life, family, and financial stressors.  A PHQ-9 screen was performed with the patient scoring a 9.  A GAD-7 screen was also performed with the patient scoring a 15. ? ?Patient is alert and oriented x4, calm, cooperative, and fully engaged in conversation during the encounter.  He endorses feeling melancholy stating that she has not been getting as much enjoyment from work as she usually does.   Patient denies suicidal or homicidal ideations.  She further denies auditory or visual hallucinations and does not appear to be responding to internal/external stimuli.  Patient endorses fair sleep and receives on average 6 to 8 hours of sleep each night.  Patient states that the amount of sleep is often dependent on her work schedule.  Patient endorses fair appetite and eats on average 2 meals per day.  Patient denies alcohol consumption, tobacco use, and illicit drug use. ? ?Visit Diagnosis:  ?  ICD-10-CM   ?1. Bipolar 2 disorder (HCC)  F31.81 ARIPiprazole (ABILIFY) 30 MG tablet  ?  escitalopram (LEXAPRO) 20 MG tablet  ?  lamoTRIgine (LAMICTAL) 25 MG tablet  ?  lamoTRIgine (LAMICTAL) 25 MG tablet  ?  ?2. Generalized anxiety disorder with panic attacks  F41.1 escitalopram (LEXAPRO) 20 MG tablet  ? F41.0 clonazePAM (KLONOPIN) 1 MG tablet  ?  ? ? ?Past Psychiatric History:  ?Insomnia ?Generalized anxiety disorder ?Bipolar 2 disorder ? ?Past Medical History:  ?Past Medical History:  ?Diagnosis Date  ? Diabetes mellitus without complication ([place])   ? High cholesterol   ? High triglycerides   ? Mood disorder ([place])   ?  ?Past Surgical History:  ?Procedure Laterality Date  ? APPENDECTOMY    ? CHOLECYSTECTOMY    ? SHOULDER SURGERY Left   ? TONSILLECTOMY    ? ? ?Family Psychiatric History:  ?Aunt (maternal) - schizophrenic ?Mother - unipolar/severe depression ?Father - Alcohol abuse, anxiety, possible bipolar but has not been diagnosed ?Biological Brother - anxious,  paranoid ?Biological Sister - Anxiety, recovering alcoholic (sober for 4 years) ? ?Family History:  ?Family History  ?Problem Relation Age of Onset  ? Cancer Mother   ? Heart failure Mother   ? Cancer Father   ? ? ?Social History:  ?Social History  ? ?Socioeconomic History  ? Marital status: Divorced  ?  Spouse name: Not on file  ? Number of children: Not on file  ? Years of education: Not on file  ? Highest education level: Not on file  ?Occupational History  ?  Not on file  ?Tobacco Use  ? Smoking status: Never  ? Smokeless tobacco: Never  ?Vaping Use  ? Vaping Use: Never used  ?Substance and Sexual Activity  ? Alcohol use: Yes  ?  Comment: rarely  ? Drug use: Never  ? Sexual activity: Yes  ?Other Topics Concern  ? Not on file  ?Social History Narrative  ? Not on file  ? ?Social Determinants of Health  ? ?Financial Resource Strain: Not on file  ?Food Insecurity: Not on file  ?Transportation Needs: Not on file  ?Physical Activity: Not on file  ?Stress: Not on file  ?Social Connections: Not on file  ? ? ?Allergies:  ?Allergies  ?Allergen Reactions  ? Compazine [Prochlorperazine]   ? Dilaudid [Hydromorphone]   ?  intolerence  ? Reglan [Metoclopramide]   ? Zofran [Ondansetron]   ? ? ?Metabolic Disorder Labs: ?Lab Results  ?Component Value Date  ? HGBA1C 8.4 (A) 07/02/2021  ? ?No results found for: PROLACTIN ?No results found for: CHOL, TRIG, HDL, CHOLHDL, VLDL, LDLCALC ?Lab Results  ?Component Value Date  ? TSH 3.727 03/05/2021  ? ? ?Therapeutic Level Labs: ?No results found for: LITHIUM ?No results found for: VALPROATE ?No components found for:  CBMZ ? ?Current Medications: ?Current Outpatient Medications  ?Medication Sig Dispense Refill  ? lamoTRIgine (LAMICTAL) 25 MG tablet Take 1 tablet (25 mg total) by mouth daily. 14 tablet 0  ? [START ON 07/28/2021] lamoTRIgine (LAMICTAL) 25 MG tablet Take 2 tablets (50 mg total) by mouth daily. 30 tablet 1  ? ARIPiprazole (ABILIFY) 30 MG tablet Take 1 tablet (30 mg total) by mouth daily. 30 tablet 1  ? Blood Glucose Monitoring Suppl (TRUE METRIX METER) w/Device KIT use kit to check blood glucose 3 three times daily before meals. 1 kit 0  ? clonazePAM (KLONOPIN) 1 MG tablet Take 1 tablet (1 mg total) by mouth 3 (three) times daily as needed for anxiety. 90 tablet 1  ? dicyclomine (BENTYL) 20 MG tablet Take 1 tablet (20 mg total) by mouth 2 (two) times daily. 20 tablet 0  ? escitalopram (LEXAPRO) 20 MG tablet Take 2 tablets (40 mg total)  by mouth daily. 60 tablet 1  ? fenofibrate (TRICOR) 48 MG tablet Take 1 tablet (48 mg total) by mouth daily. 30 tablet 2  ? furosemide (LASIX) 40 MG tablet Take 1 tablet (40 mg total) by mouth 2 (two) times daily as needed. 15 tablet 0  ? gabapentin (NEURONTIN) 300 MG capsule Take 2 capsules (600 mg total) by mouth 3 (three) times daily. 180 capsule 3  ? glucose blood (TRUE METRIX BLOOD GLUCOSE TEST) test strip Use 1 test strip to check blood glucose 3 times daily 100 each 12  ? hydrochlorothiazide (HYDRODIURIL) 12.5 MG tablet Take 1 tablet (12.5 mg total) by mouth daily for 5 days. 30 tablet 1  ? insulin aspart (NOVOLOG FLEXPEN) 100 UNIT/ML FlexPen Inject 0-12 Units into the skin 3 (three)  times daily with meals. As per sliding scale 15 mL 11  ? insulin glargine (LANTUS) 100 UNIT/ML injection Inject 40 units in AM and 40 units in PM 10 mL 11  ? Insulin Syringe-Needle U-100 (TRUEPLUS INSULIN SYRINGE) 31G X 5/16" 1 ML MISC Use to inject Lantus twice a day. 100 each 3  ? metFORMIN (GLUCOPHAGE-XR) 500 MG 24 hr tablet Take one daily with food for one week then two daily with food 60 tablet 4  ? naproxen (NAPROSYN) 500 MG tablet Take 1 tablet (500 mg total) by mouth 2 (two) times daily. 30 tablet 0  ? rosuvastatin (CRESTOR) 20 MG tablet Take 1 tablet (20 mg total) by mouth daily for 30 doses. 30 tablet 2  ? Semaglutide, 1 MG/DOSE, 4 MG/3ML SOPN Inject 1 mg as directed once a week. 3 mL 3  ? TRUEplus Lancets 28G MISC use 1 lancet to check blood glucose 3 times daily before meals. 100 each 12  ? ?No current facility-administered medications for this visit.  ? ? ? ?Musculoskeletal: ?Strength & Muscle Tone: within normal limits ?Gait & Station: normal ?Patient leans: N/A ? ?Psychiatric Specialty Exam: ?Review of Systems  ?Psychiatric/Behavioral:  Positive for sleep disturbance. Negative for decreased concentration, dysphoric mood, hallucinations, self-injury and suicidal ideas. The patient is nervous/anxious. The patient is  not hyperactive.    ?Blood pressure 124/79, pulse 78, height _0  (1.651 m), weight 207 lb (93.9 kg).Body mass index is 34.45 kg/m?.  ?General Appearance: Fairly Groomed  ?Eye Contact:  Good  ?Speech:  Cl

## 2021-07-18 ENCOUNTER — Encounter (HOSPITAL_COMMUNITY): Payer: Self-pay | Admitting: Physician Assistant

## 2021-07-28 ENCOUNTER — Other Ambulatory Visit: Payer: Self-pay

## 2021-08-01 ENCOUNTER — Other Ambulatory Visit: Payer: Self-pay

## 2021-08-04 ENCOUNTER — Other Ambulatory Visit: Payer: Self-pay

## 2021-08-04 ENCOUNTER — Other Ambulatory Visit: Payer: Self-pay | Admitting: Family Medicine

## 2021-08-04 DIAGNOSIS — F410 Panic disorder [episodic paroxysmal anxiety] without agoraphobia: Secondary | ICD-10-CM

## 2021-08-04 DIAGNOSIS — E1142 Type 2 diabetes mellitus with diabetic polyneuropathy: Secondary | ICD-10-CM

## 2021-08-05 ENCOUNTER — Other Ambulatory Visit: Payer: Self-pay

## 2021-08-05 ENCOUNTER — Emergency Department (HOSPITAL_BASED_OUTPATIENT_CLINIC_OR_DEPARTMENT_OTHER)
Admission: EM | Admit: 2021-08-05 | Discharge: 2021-08-05 | Disposition: A | Discharge status: Home / Self Care | Payer: Self-pay | Attending: Emergency Medicine | Admitting: Emergency Medicine

## 2021-08-05 ENCOUNTER — Encounter (HOSPITAL_BASED_OUTPATIENT_CLINIC_OR_DEPARTMENT_OTHER): Payer: Self-pay | Admitting: Obstetrics and Gynecology

## 2021-08-05 DIAGNOSIS — Z5321 Procedure and treatment not carried out due to patient leaving prior to being seen by health care provider: Secondary | ICD-10-CM | POA: Insufficient documentation

## 2021-08-05 DIAGNOSIS — R6883 Chills (without fever): Secondary | ICD-10-CM | POA: Insufficient documentation

## 2021-08-05 DIAGNOSIS — R350 Frequency of micturition: Secondary | ICD-10-CM | POA: Insufficient documentation

## 2021-08-05 DIAGNOSIS — R102 Pelvic and perineal pain: Secondary | ICD-10-CM | POA: Insufficient documentation

## 2021-08-05 LAB — URINALYSIS, ROUTINE W REFLEX MICROSCOPIC
Bilirubin Urine: NEGATIVE
Glucose, UA: 500 mg/dL — AB
Ketones, ur: NEGATIVE mg/dL
Nitrite: NEGATIVE
Protein, ur: 30 mg/dL — AB
RBC / HPF: >50 RBC/hpf — ABNORMAL HIGH (ref 0–5)
Specific Gravity, Urine: 1.035 — ABNORMAL HIGH (ref 1.005–1.030)
WBC, UA: >50 WBC/hpf — ABNORMAL HIGH (ref 0–5)
pH: 5.5 (ref 5.0–8.0)

## 2021-08-05 LAB — PREGNANCY, URINE: Preg Test, Ur: NEGATIVE

## 2021-08-05 MED ORDER — GABAPENTIN 300 MG PO CAPS
600.0000 mg | ORAL_CAPSULE | Freq: Three times a day (TID) | ORAL | 3 refills | Status: DC
  Filled 2021-08-05: qty 180, 30d supply, fill #0
  Filled 2021-09-04: qty 180, 30d supply, fill #1
  Filled 2021-10-09: qty 180, 30d supply, fill #2
  Filled 2021-11-10: qty 180, 30d supply, fill #3

## 2021-08-05 NOTE — ED Triage Notes (Signed)
Patient endorses urinary frequency, pressure in pelvic and chills. Patient reports she thinks she has a UTI ?

## 2021-08-05 NOTE — Telephone Encounter (Signed)
Requested Prescriptions  ?Pending Prescriptions Disp Refills  ?? gabapentin (NEURONTIN) 300 MG capsule 180 capsule 3  ?  Sig: Take 2 capsules (600 mg total) by mouth 3 (three) times daily.  ?  ? Neurology: Anticonvulsants - gabapentin Passed - 08/04/2021  9:36 AM  ?  ?  Passed - Cr in normal range and within 360 days  ?  Creatinine, Ser  ?Date Value Ref Range Status  ?06/16/2021 0.75 0.57 - 1.00 mg/dL Final  ?   ?  ?  Passed - Completed PHQ-2 or PHQ-9 in the last 360 days  ?  ?  Passed - Valid encounter within last 12 months  ?  Recent Outpatient Visits   ?      ? 1 month ago Type 2 diabetes mellitus with hyperglycemia, with long-term current use of insulin (HCC)  ? [facility] [place], Odette Horns, MD  ? 1 month ago Diabetes mellitus without complication [facility])  ? [facility] Storm Frisk, MD  ? 3 months ago Type 2 diabetes mellitus with other specified complication, with long-term current use of insulin (HCC)  ? [facility] [place], Cornelius Moras, RPH-CPP  ? 4 months ago Screening for viral disease  ? [facility] Hoy Register, MD  ?  ?  ?Future Appointments   ?        ? In 2 months Hoy Register, MD [facility]  ?  ? ?  ?  ?  ? ?

## 2021-08-06 ENCOUNTER — Other Ambulatory Visit: Payer: Self-pay

## 2021-08-07 ENCOUNTER — Other Ambulatory Visit: Payer: Self-pay

## 2021-08-09 ENCOUNTER — Encounter (HOSPITAL_BASED_OUTPATIENT_CLINIC_OR_DEPARTMENT_OTHER): Payer: Self-pay | Admitting: Emergency Medicine

## 2021-08-09 ENCOUNTER — Other Ambulatory Visit: Payer: Self-pay

## 2021-08-09 DIAGNOSIS — B009 Herpesviral infection, unspecified: Secondary | ICD-10-CM | POA: Insufficient documentation

## 2021-08-09 NOTE — ED Triage Notes (Signed)
Pt c/o possible UTI or HSV flare up, pt has new sexual partner. ?

## 2021-08-10 ENCOUNTER — Emergency Department (HOSPITAL_BASED_OUTPATIENT_CLINIC_OR_DEPARTMENT_OTHER)
Admission: EM | Admit: 2021-08-10 | Discharge: 2021-08-10 | Disposition: A | Discharge status: Home / Self Care | Payer: Self-pay | Attending: Emergency Medicine | Admitting: Emergency Medicine

## 2021-08-10 DIAGNOSIS — B009 Herpesviral infection, unspecified: Secondary | ICD-10-CM

## 2021-08-10 LAB — WET PREP, GENITAL
Clue Cells Wet Prep HPF POC: NONE SEEN
Sperm: NONE SEEN
Trich, Wet Prep: NONE SEEN
WBC, Wet Prep HPF POC: <10 (ref <10)
Yeast Wet Prep HPF POC: NONE SEEN

## 2021-08-10 LAB — URINALYSIS, ROUTINE W REFLEX MICROSCOPIC
Bilirubin Urine: NEGATIVE
Glucose, UA: 500 mg/dL — AB
Ketones, ur: NEGATIVE mg/dL
Leukocytes,Ua: NEGATIVE
Nitrite: NEGATIVE
Specific Gravity, Urine: 1.03 (ref 1.005–1.030)
pH: 5.5 (ref 5.0–8.0)

## 2021-08-10 LAB — PREGNANCY, URINE: Preg Test, Ur: NEGATIVE

## 2021-08-10 MED ORDER — VALACYCLOVIR HCL 1 G PO TABS: 1000.0000 mg | ORAL_TABLET | Freq: Two times a day (BID) | ORAL | 0 refills | Status: DC

## 2021-08-10 NOTE — ED Provider Notes (Signed)
?[facility] ?Provider Note ? ? ?CSN: [id number] ?Arrival date & time: 08/09/21  2205 ? ?  ? ?History ? ?Chief Complaint  ?Patient presents with  ? SEXUALLY TRANSMITTED DISEASE  ? ? ?Danielle Barker is a 46 y.o. female. ? ?The history is provided by the patient.  ? ?  ?Patient presents for concern for STD. ?She reports over the past several days she has had burning when she urinates and feel that she may have a new herpes outbreak.  She has had this previously.  No vaginal bleeding or discharge.  She reports new sexual partner and is not utilizing protection ?She reports she has 1 sexual partner at this time. ?No abdominal pain. ?Home Medications ?Prior to Admission medications   ?Medication Sig Start Date End Date Taking? Authorizing Provider  ?valACYclovir (VALTREX) 1000 MG tablet Take 1 tablet (1,000 mg total) by mouth 2 (two) times daily. 08/10/21  Yes Ripley Fraise, MD  ?ARIPiprazole (ABILIFY) 30 MG tablet Take 1 tablet (30 mg total) by mouth daily. 07/15/21   Malachy Mood, PA  ?Blood Glucose Monitoring Suppl (TRUE METRIX METER) w/Device KIT use kit to check blood glucose 3 three times daily before meals. 03/31/21   Charlott Rakes, MD  ?clonazePAM (KLONOPIN) 1 MG tablet Take 1 tablet (1 mg total) by mouth 3 (three) times daily as needed for anxiety. 07/15/21 07/15/22  Malachy Mood, PA  ?dicyclomine (BENTYL) 20 MG tablet Take 1 tablet (20 mg total) by mouth 2 (two) times daily. 02/28/21   Hazel Sams, PA-C  ?escitalopram (LEXAPRO) 20 MG tablet Take 2 tablets (40 mg total) by mouth daily. 07/15/21   Nwoko, Terese Door, PA  ?fenofibrate (TRICOR) 48 MG tablet Take 1 tablet (48 mg total) by mouth daily. 04/17/21 08/02/21  Charlott Rakes, MD  ?furosemide (LASIX) 40 MG tablet Take 1 tablet (40 mg total) by mouth 2 (two) times daily as needed. 03/23/21   Blanchie Dessert, MD  ?gabapentin (NEURONTIN) 300 MG capsule Take 2 capsules (600 mg total) by mouth 3 (three) times daily. 08/05/21  09/06/21  Charlott Rakes, MD  ?glucose blood (TRUE METRIX BLOOD GLUCOSE TEST) test strip Use 1 test strip to check blood glucose 3 times daily 03/31/21   Charlott Rakes, MD  ?hydrochlorothiazide (HYDRODIURIL) 12.5 MG tablet Take 1 tablet (12.5 mg total) by mouth daily for 5 days. 03/31/21 08/02/21  Charlott Rakes, MD  ?insulin aspart (NOVOLOG FLEXPEN) 100 UNIT/ML FlexPen Inject 0-12 Units into the skin 3 (three) times daily with meals. As per sliding scale 03/31/21   Charlott Rakes, MD  ?insulin glargine (LANTUS) 100 UNIT/ML injection Inject 40 units in AM and 40 units in PM 06/16/21   Elsie Stain, MD  ?Insulin Syringe-Needle U-100 (TRUEPLUS INSULIN SYRINGE) 31G X 5/16" 1 ML MISC Use to inject Lantus twice a day. 05/26/21   Charlott Rakes, MD  ?lamoTRIgine (LAMICTAL) 25 MG tablet Take 1 tablet (25 mg total) by mouth daily. 07/15/21 07/15/22  Malachy Mood, PA  ?lamoTRIgine (LAMICTAL) 25 MG tablet Take 2 tablets (50 mg total) by mouth daily. 07/28/21 07/28/22  Malachy Mood, PA  ?metFORMIN (GLUCOPHAGE-XR) 500 MG 24 hr tablet Take one daily with food for one week then two daily with food 06/16/21   Elsie Stain, MD  ?naproxen (NAPROSYN) 500 MG tablet Take 1 tablet (500 mg total) by mouth 2 (two) times daily. 09/18/20   Maudie Flakes, MD  ?rosuvastatin (CRESTOR) 20 MG tablet Take 1 tablet (20  mg total) by mouth daily for 30 doses. 04/17/21 08/02/21  Charlott Rakes, MD  ?Semaglutide, 1 MG/DOSE, 4 MG/3ML SOPN Inject 1 mg as directed once a week. 07/07/21   Charlott Rakes, MD  ?TRUEplus Lancets 28G MISC use 1 lancet to check blood glucose 3 times daily before meals. 03/31/21   Charlott Rakes, MD  ?citalopram (CELEXA) 40 MG tablet Take 40 mg by mouth daily.  11/25/20  [provider]  ?   ? ?Allergies    ?Compazine [prochlorperazine], Dilaudid [hydromorphone], Reglan [metoclopramide], and Zofran [ondansetron]   ? ?Review of Systems   ?Review of Systems  ?Constitutional:  Negative for fever.   ?Gastrointestinal:  Negative for abdominal pain and vomiting.  ?Genitourinary:  Positive for vaginal pain. Negative for dyspareunia and vaginal bleeding.  ? ?Physical Exam ?Updated Vital Signs ?BP 110/64 (BP Location: Right Arm)   Pulse 91   Temp 98.3 ?F (36.8 ?C)   Resp 16   Ht 1.651 m ($Remove'5\' 5"'RlbRnob$ )   Wt 90.3 kg   LMP 08/04/2021 (Exact Date)   SpO2 97%   BMI 33.12 kg/m?  ?Physical Exam ?CONSTITUTIONAL: Well developed/well nourished, no distress ?HEAD: Normocephalic/atraumatic ?EYES: EOMI ?CV: S1/S2 noted, no murmurs/rubs/gallops noted ?LUNGS: Lungs are clear to auscultation bilaterally, no apparent distress ?ABDOMEN: soft, nontender, no rebound or guarding, bowel sounds noted throughout abdomen ?GU: Chaperoned pelvic exam performed ?Patient has scattered external lesions consistent with herpes outbreak.  No vaginal bleeding or discharge.  No cervical motion tenderness ?NEURO: Pt is awake/alert/appropriate, moves all extremitiesx4.  No facial droop.   ?EXTREMITIES: full ROM ?SKIN: warm, color normal ?PSYCH: ? ?ED Results / Procedures / Treatments   ?Labs ?(all labs ordered are listed, but only abnormal results are displayed) ?Labs Reviewed  ?URINALYSIS, ROUTINE W REFLEX MICROSCOPIC - Abnormal; Notable for the following components:  ?    Result Value  ? Glucose, UA 500 (*)   ? Hgb urine dipstick SMALL (*)   ? Protein, ur TRACE (*)   ? Bacteria, UA MANY (*)   ? All other components within normal limits  ?WET PREP, GENITAL  ?PREGNANCY, URINE  ?GC/CHLAMYDIA PROBE AMP ([place]) NOT AT [facility]  ? ? ?EKG ?None ? ?Radiology ?No results found. ? ?Procedures ?Procedures  ? ? ?Medications Ordered in ED ?Medications - No data to display ? ?ED Course/ Medical Decision Making/ A&P ?  ?                        ?Medical Decision Making ?Amount and/or Complexity of Data Reviewed ?Labs: ordered. ? ?Risk ?Prescription drug management. ? ? ?This patient presents to the ED for concern of STD, this involves an extensive number of  treatment options, and is a complaint that carries with it a high risk of complications and morbidity.  The differential diagnosis includes urinary tract infection, gonorrhea, chlamydia, herpes ? ?Comorbidities that complicate the patient evaluation: ?Patient?s presentation is complicated by their history of bipolar and previous HSV ? ?Social Determinants of Health: ?Patient?s  lack of contraceptives use   increases the complexity of managing their presentation ? ? ?Complexity of problems addressed: ?Patient?s presentation is most consistent with  acute complicated illness/injury requiring diagnostic workup ? ?Disposition: ?After consideration of the diagnostic results and the patient?s response to treatment,  ?I feel that the patent would benefit from discharge   .  ? ?Patient will be treated for HSV outbreak.  We discussed safe sex instructions.  She will be treated with  valacyclovir ? ? ? ? ? ? ? ?Final Clinical Impression(s) / ED Diagnoses ?Final diagnoses:  ?HSV infection  ? ? ?Rx / DC Orders ?ED Discharge Orders   ? ?      Ordered  ?  valACYclovir (VALTREX) 1000 MG tablet  2 times daily       ? 08/10/21 0109  ? ?  ?  ? ?  ? ? ?  ?Ripley Fraise, MD ?08/10/21 0789 ? ?

## 2021-08-12 ENCOUNTER — Other Ambulatory Visit: Payer: Self-pay

## 2021-08-12 LAB — GC/CHLAMYDIA PROBE AMP (~~LOC~~) NOT AT ARMC
Chlamydia: NEGATIVE
Comment: NEGATIVE
Comment: NORMAL
Neisseria Gonorrhea: NEGATIVE

## 2021-08-15 ENCOUNTER — Other Ambulatory Visit: Payer: Self-pay

## 2021-08-19 ENCOUNTER — Encounter (HOSPITAL_BASED_OUTPATIENT_CLINIC_OR_DEPARTMENT_OTHER): Payer: Self-pay

## 2021-08-19 ENCOUNTER — Other Ambulatory Visit: Payer: Self-pay

## 2021-08-19 ENCOUNTER — Emergency Department (HOSPITAL_BASED_OUTPATIENT_CLINIC_OR_DEPARTMENT_OTHER)
Admission: EM | Admit: 2021-08-19 | Discharge: 2021-08-19 | Disposition: A | Discharge status: Home / Self Care | Payer: Self-pay | Attending: Emergency Medicine | Admitting: Emergency Medicine

## 2021-08-19 DIAGNOSIS — Y93E1 Activity, personal bathing and showering: Secondary | ICD-10-CM | POA: Insufficient documentation

## 2021-08-19 DIAGNOSIS — Z7984 Long term (current) use of oral hypoglycemic drugs: Secondary | ICD-10-CM | POA: Insufficient documentation

## 2021-08-19 DIAGNOSIS — Z79899 Other long term (current) drug therapy: Secondary | ICD-10-CM | POA: Insufficient documentation

## 2021-08-19 DIAGNOSIS — Z794 Long term (current) use of insulin: Secondary | ICD-10-CM | POA: Insufficient documentation

## 2021-08-19 DIAGNOSIS — M25531 Pain in right wrist: Secondary | ICD-10-CM | POA: Insufficient documentation

## 2021-08-19 DIAGNOSIS — W010XXA Fall on same level from slipping, tripping and stumbling without subsequent striking against object, initial encounter: Secondary | ICD-10-CM | POA: Insufficient documentation

## 2021-08-19 MED ORDER — HYDROCODONE-ACETAMINOPHEN 5-325 MG PO TABS
1.0000 | ORAL_TABLET | Freq: Four times a day (QID) | ORAL | 0 refills | Status: DC | PRN
Start: 2021-08-19 — End: 2022-03-18

## 2021-08-19 MED ORDER — HYDROCODONE-ACETAMINOPHEN 5-325 MG PO TABS
1.0000 | ORAL_TABLET | Freq: Four times a day (QID) | ORAL | 0 refills | Status: DC | PRN
  Filled 2021-08-19: qty 5, 2d supply, fill #0

## 2021-08-19 NOTE — ED Triage Notes (Signed)
Patient here POV from Home. ? ?Endorses Falling in Shower approximately 30 minutes PTA. Pain from Right Shoulder to Right Wrist. ? ?No LOC. No Head Injury. No Neck Pain. No Anticoagulants.  ? ?NAD Noted during Triage. A&Ox4. GCS 15. Ambulatory.  ?

## 2021-08-19 NOTE — ED Provider Notes (Signed)
?[facility] ?Provider Note ? ? ?CSN: [id number] ?Arrival date & time: 08/19/21  1455 ? ?  ? ?History ? ?Chief Complaint  ?Patient presents with  ? Fall  ? ? ?EULA JASTER is a 46 y.o. female. ? ?HPI ? ?46 year old female presents emergency department mechanical fall.  Patient states that she was in the shower when she slipped and fell down and caught herself on the right wrist.  She is significant dominant.  Complaining of right wrist and right shoulder pain.  No head injury, no loss of consciousness.  No preceding syncopal symptoms.  Patient states that she does not have medical insurance and would not like x-rays. ? ?Home Medications ?Prior to Admission medications   ?Medication Sig Start Date End Date Taking? Authorizing Provider  ?ARIPiprazole (ABILIFY) 30 MG tablet Take 1 tablet (30 mg total) by mouth daily. 07/15/21   Malachy Mood, PA  ?Blood Glucose Monitoring Suppl (TRUE METRIX METER) w/Device KIT use kit to check blood glucose 3 three times daily before meals. 03/31/21   Charlott Rakes, MD  ?clonazePAM (KLONOPIN) 1 MG tablet Take 1 tablet (1 mg total) by mouth 3 (three) times daily as needed for anxiety. 07/15/21 07/15/22  Malachy Mood, PA  ?dicyclomine (BENTYL) 20 MG tablet Take 1 tablet (20 mg total) by mouth 2 (two) times daily. 02/28/21   Hazel Sams, PA-C  ?escitalopram (LEXAPRO) 20 MG tablet Take 2 tablets (40 mg total) by mouth daily. 07/15/21   Nwoko, Terese Door, PA  ?fenofibrate (TRICOR) 48 MG tablet Take 1 tablet (48 mg total) by mouth daily. 04/17/21 08/02/21  Charlott Rakes, MD  ?furosemide (LASIX) 40 MG tablet Take 1 tablet (40 mg total) by mouth 2 (two) times daily as needed. 03/23/21   Blanchie Dessert, MD  ?gabapentin (NEURONTIN) 300 MG capsule Take 2 capsules (600 mg total) by mouth 3 (three) times daily. 08/05/21 09/06/21  Charlott Rakes, MD  ?glucose blood (TRUE METRIX BLOOD GLUCOSE TEST) test strip Use 1 test strip to check blood glucose 3 times daily  03/31/21   Charlott Rakes, MD  ?hydrochlorothiazide (HYDRODIURIL) 12.5 MG tablet Take 1 tablet (12.5 mg total) by mouth daily for 5 days. 03/31/21 08/02/21  Charlott Rakes, MD  ?HYDROcodone-acetaminophen (NORCO) 5-325 MG tablet Take 1 tablet by mouth every 6 (six) hours as needed for moderate pain. 08/19/21   Janalynn Eder, Alvin Critchley, DO  ?insulin aspart (NOVOLOG FLEXPEN) 100 UNIT/ML FlexPen Inject 0-12 Units into the skin 3 (three) times daily with meals. As per sliding scale 03/31/21   Charlott Rakes, MD  ?insulin glargine (LANTUS) 100 UNIT/ML injection Inject 40 units in AM and 40 units in PM 06/16/21   Elsie Stain, MD  ?Insulin Syringe-Needle U-100 (TRUEPLUS INSULIN SYRINGE) 31G X 5/16" 1 ML MISC Use to inject Lantus twice a day. 05/26/21   Charlott Rakes, MD  ?lamoTRIgine (LAMICTAL) 25 MG tablet Take 1 tablet (25 mg total) by mouth daily. 07/15/21 07/15/22  Malachy Mood, PA  ?lamoTRIgine (LAMICTAL) 25 MG tablet Take 2 tablets (50 mg total) by mouth daily. 07/28/21 07/28/22  Malachy Mood, PA  ?metFORMIN (GLUCOPHAGE-XR) 500 MG 24 hr tablet Take one daily with food for one week then two daily with food 06/16/21   Elsie Stain, MD  ?naproxen (NAPROSYN) 500 MG tablet Take 1 tablet (500 mg total) by mouth 2 (two) times daily. 09/18/20   Maudie Flakes, MD  ?rosuvastatin (CRESTOR) 20 MG tablet Take 1 tablet (20 mg total)  by mouth daily for 30 doses. 04/17/21 08/02/21  Charlott Rakes, MD  ?Semaglutide, 1 MG/DOSE, 4 MG/3ML SOPN Inject 1 mg as directed once a week. 07/07/21   Charlott Rakes, MD  ?TRUEplus Lancets 28G MISC use 1 lancet to check blood glucose 3 times daily before meals. 03/31/21   Charlott Rakes, MD  ?valACYclovir (VALTREX) 1000 MG tablet Take 1 tablet (1,000 mg total) by mouth 2 (two) times daily. 08/10/21   Ripley Fraise, MD  ?citalopram (CELEXA) 40 MG tablet Take 40 mg by mouth daily.  11/25/20  [provider]  ?   ? ?Allergies    ?Compazine [prochlorperazine], Dilaudid [hydromorphone], Reglan  [metoclopramide], and Zofran [ondansetron]   ? ?Review of Systems   ?Review of Systems  ?Constitutional:  Negative for fever.  ?Cardiovascular:  Negative for chest pain and palpitations.  ?Gastrointestinal:  Negative for abdominal pain.  ?Musculoskeletal:  Negative for back pain and neck pain.  ?     Right wrist and shoulder pain  ?Neurological:  Negative for syncope, light-headedness and headaches.  ? ?Physical Exam ?Updated Vital Signs ?BP 120/71 (BP Location: Left Arm)   Pulse 76   Temp 98 ?F (36.7 ?C) (Oral)   Resp 16   Ht $R'5\' 5"'Cc$  (1.651 m)   Wt 90.3 kg   LMP 08/04/2021 (Exact Date)   SpO2 97%   BMI 33.13 kg/m?  ?Physical Exam ?Vitals and nursing note reviewed.  ?Constitutional:   ?   Appearance: Normal appearance.  ?HENT:  ?   Head: Normocephalic.  ?   Mouth/Throat:  ?   Mouth: Mucous membranes are moist.  ?Cardiovascular:  ?   Rate and Rhythm: Normal rate.  ?Musculoskeletal:  ?   Comments: Very mild right wrist edema, no ecchymosis, +snuff box TTP, No hand, forearm or elbow pain, mild shoulder pain with ROM, no point bony TTP, full ROM, neurovasc in tact  ?Skin: ?   General: Skin is warm.  ?Neurological:  ?   Mental Status: She is alert and oriented to person, place, and time. Mental status is at baseline.  ?Psychiatric:     ?   Mood and Affect: Mood normal.  ? ? ?ED Results / Procedures / Treatments   ?Labs ?(all labs ordered are listed, but only abnormal results are displayed) ?Labs Reviewed - No data to display ? ?EKG ?None ? ?Radiology ?No results found. ? ?Procedures ?Procedures  ? ? ?Medications Ordered in ED ?Medications - No data to display ? ?ED Course/ Medical Decision Making/ A&P ?  ?                        ?Medical Decision Making ?Risk ?Prescription drug management. ? ? ?46 year old female presents with mechanical fall and right arm/wrist injury.  No significant edema or ecchymosis of the right wrist.  Shoulder sore but unremarkable. Hand and arm is otherwise neurovascularly intact.  Mild  TTP of snuffbox.  Explained importance of obtaining possible x-rays in regards to this tenderness however patient declined she does not have insurance.  Patient understands without x-rays that she could have an undiagnosed fracture.  Given the snuffbox tenderness we will place her in a thumb spica splint and refer her to [facility] as an outpatient for follow-up with encouragement to return for x-rays if worsening. ? ?Patient at this time appears safe and stable for discharge and close outpatient follow up. Discharge plan and strict return to ED precautions discussed, patient verbalizes understanding and  agreement. ? ? ? ? ? ? ?Final Clinical Impression(s) / ED Diagnoses ?Final diagnoses:  ?Right wrist pain  ? ? ?Rx / DC Orders ?ED Discharge Orders   ? ?      Ordered  ?  HYDROcodone-acetaminophen (NORCO) 5-325 MG tablet  Every 6 hours PRN,   Status:  Discontinued       ? 08/19/21 1630  ?  HYDROcodone-acetaminophen (NORCO) 5-325 MG tablet  Every 6 hours PRN       ? 08/19/21 1639  ? ?  ?  ? ?  ? ? ?  ?Lorelle Gibbs, DO ?08/19/21 1706 ? ?

## 2021-08-19 NOTE — Discharge Instructions (Addendum)
You have declined XR. It is possible you have a wrist fracture. Wear your splint as directed. If significant swelling or severe pain, follow up at [facility] ortho. Avoid any heavy lifting. Elevate and ice the wrist. Take Ibuprofen for pain control. Use stronger pain medicine as needed.  Do not mix this medication with alcohol or other sedating medications. Do not drive or do heavy physical activity until you know how this medication affects you.  It may cause drowsiness. ?

## 2021-08-21 ENCOUNTER — Emergency Department (HOSPITAL_BASED_OUTPATIENT_CLINIC_OR_DEPARTMENT_OTHER): Payer: Self-pay

## 2021-08-21 ENCOUNTER — Emergency Department (HOSPITAL_BASED_OUTPATIENT_CLINIC_OR_DEPARTMENT_OTHER)
Admission: EM | Admit: 2021-08-21 | Discharge: 2021-08-21 | Disposition: A | Discharge status: Home / Self Care | Payer: Self-pay | Attending: Emergency Medicine | Admitting: Emergency Medicine

## 2021-08-21 ENCOUNTER — Encounter (HOSPITAL_BASED_OUTPATIENT_CLINIC_OR_DEPARTMENT_OTHER): Payer: Self-pay | Admitting: Emergency Medicine

## 2021-08-21 ENCOUNTER — Other Ambulatory Visit: Payer: Self-pay

## 2021-08-21 DIAGNOSIS — Z794 Long term (current) use of insulin: Secondary | ICD-10-CM | POA: Insufficient documentation

## 2021-08-21 DIAGNOSIS — M25531 Pain in right wrist: Secondary | ICD-10-CM

## 2021-08-21 DIAGNOSIS — E1140 Type 2 diabetes mellitus with diabetic neuropathy, unspecified: Secondary | ICD-10-CM | POA: Insufficient documentation

## 2021-08-21 DIAGNOSIS — R202 Paresthesia of skin: Secondary | ICD-10-CM

## 2021-08-21 DIAGNOSIS — Z7984 Long term (current) use of oral hypoglycemic drugs: Secondary | ICD-10-CM | POA: Insufficient documentation

## 2021-08-21 NOTE — Discharge Instructions (Signed)
You can wear the sling for comfort at home.  The tingling in her fingers may be related to wearing a wrist brace too tight.  I recommend you wear a sling instead.  We do not see any obvious fracture on your x-ray.  I explained the chance that there may be a smaller fracture or hairline fracture that will only be visible after 7 or 10 days.  If you are still having significant pain in your wrist after 7-10 days, you should have another x-ray of your wrist again. ? ?We also talked about a potential stroke work-up.  Your CT scan of the brain did not show any sign of an obvious stroke.  However I explained an MRI would be a much better image to rule this out.  I think it is less likely that you are dealing with a stroke because the wrist brace is likely causing her tingling.  We did discuss the option of going to [facility] for an MRI but you did not want to do this at this time.  If you begin to develop new symptoms of a stroke, including facial droop, difficulty speaking, dizziness, or numbness or weakness in any other part of your body, please come back to the hospital immediately. ?

## 2021-08-21 NOTE — ED Triage Notes (Signed)
Tuesday Pt fell in shower and braced fall with right hand. Pt denis hitting head.  Pt a & O x 4, Ambulatory. GCS 15 ? ?

## 2021-08-21 NOTE — ED Provider Notes (Signed)
?[facility] ?Provider Note ? ? ?CSN: [id number] ?Arrival date & time: 08/21/21  1051 ? ?  ? ?History ? ?Chief Complaint  ?Patient presents with  ? Numbness  ? ? ?Danielle Barker is a 46 y.o. female with history of diabetes, diabetic neuropathy, high cholesterol, presenting to the emergency department with tingling in her right hand and pain in her right wrist. The patient was seen 2 days ago in the emergency department on 08/19/21 after a fall in the shower and having pain in her right distal radius, but had refused x-rays at that time because she said "I was not insured".  She was placed in a wrist brace and returns today complaining of worsening pain in her wrist but also having some "tingling at the tips of my fingers" in all the fingers of her right hand.  She denies any other active symptoms including headache, paresthesias in the rest of her body (although she does have some chronic lower extremity diabetic neuropathy), difficulty with speech.  She denies any history of stroke or TIA. ? ?HPI ? ?  ? ?Home Medications ?Prior to Admission medications   ?Medication Sig Start Date End Date Taking? Authorizing Provider  ?ARIPiprazole (ABILIFY) 30 MG tablet Take 1 tablet (30 mg total) by mouth daily. 07/15/21   Malachy Mood, PA  ?Blood Glucose Monitoring Suppl (TRUE METRIX METER) w/Device KIT use kit to check blood glucose 3 three times daily before meals. 03/31/21   Charlott Rakes, MD  ?clonazePAM (KLONOPIN) 1 MG tablet Take 1 tablet (1 mg total) by mouth 3 (three) times daily as needed for anxiety. 07/15/21 07/15/22  Malachy Mood, PA  ?dicyclomine (BENTYL) 20 MG tablet Take 1 tablet (20 mg total) by mouth 2 (two) times daily. 02/28/21   Hazel Sams, PA-C  ?escitalopram (LEXAPRO) 20 MG tablet Take 2 tablets (40 mg total) by mouth daily. 07/15/21   Nwoko, Terese Door, PA  ?fenofibrate (TRICOR) 48 MG tablet Take 1 tablet (48 mg total) by mouth daily. 04/17/21 08/02/21  Charlott Rakes,  MD  ?furosemide (LASIX) 40 MG tablet Take 1 tablet (40 mg total) by mouth 2 (two) times daily as needed. 03/23/21   Blanchie Dessert, MD  ?gabapentin (NEURONTIN) 300 MG capsule Take 2 capsules (600 mg total) by mouth 3 (three) times daily. 08/05/21 09/06/21  Charlott Rakes, MD  ?glucose blood (TRUE METRIX BLOOD GLUCOSE TEST) test strip Use 1 test strip to check blood glucose 3 times daily 03/31/21   Charlott Rakes, MD  ?hydrochlorothiazide (HYDRODIURIL) 12.5 MG tablet Take 1 tablet (12.5 mg total) by mouth daily for 5 days. 03/31/21 08/02/21  Charlott Rakes, MD  ?HYDROcodone-acetaminophen (NORCO) 5-325 MG tablet Take 1 tablet by mouth every 6 (six) hours as needed for moderate pain. 08/19/21   Horton, Alvin Critchley, DO  ?insulin aspart (NOVOLOG FLEXPEN) 100 UNIT/ML FlexPen Inject 0-12 Units into the skin 3 (three) times daily with meals. As per sliding scale 03/31/21   Charlott Rakes, MD  ?insulin glargine (LANTUS) 100 UNIT/ML injection Inject 40 units in AM and 40 units in PM 06/16/21   Elsie Stain, MD  ?Insulin Syringe-Needle U-100 (TRUEPLUS INSULIN SYRINGE) 31G X 5/16" 1 ML MISC Use to inject Lantus twice a day. 05/26/21   Charlott Rakes, MD  ?lamoTRIgine (LAMICTAL) 25 MG tablet Take 1 tablet (25 mg total) by mouth daily. 07/15/21 07/15/22  Malachy Mood, PA  ?lamoTRIgine (LAMICTAL) 25 MG tablet Take 2 tablets (50 mg total) by mouth daily.  07/28/21 07/28/22  Malachy Mood, PA  ?metFORMIN (GLUCOPHAGE-XR) 500 MG 24 hr tablet Take one daily with food for one week then two daily with food 06/16/21   Elsie Stain, MD  ?naproxen (NAPROSYN) 500 MG tablet Take 1 tablet (500 mg total) by mouth 2 (two) times daily. 09/18/20   Maudie Flakes, MD  ?rosuvastatin (CRESTOR) 20 MG tablet Take 1 tablet (20 mg total) by mouth daily for 30 doses. 04/17/21 08/02/21  Charlott Rakes, MD  ?Semaglutide, 1 MG/DOSE, 4 MG/3ML SOPN Inject 1 mg as directed once a week. 07/07/21   Charlott Rakes, MD  ?TRUEplus Lancets 28G MISC use 1 lancet  to check blood glucose 3 times daily before meals. 03/31/21   Charlott Rakes, MD  ?valACYclovir (VALTREX) 1000 MG tablet Take 1 tablet (1,000 mg total) by mouth 2 (two) times daily. 08/10/21   Ripley Fraise, MD  ?citalopram (CELEXA) 40 MG tablet Take 40 mg by mouth daily.  11/25/20  [provider]  ?   ? ?Allergies    ?Compazine [prochlorperazine], Dilaudid [hydromorphone], Reglan [metoclopramide], and Zofran [ondansetron]   ? ?Review of Systems   ?Review of Systems ? ?Physical Exam ?Updated Vital Signs ?BP 123/82 (BP Location: Right Arm)   Pulse 84   Temp 98.1 ?F (36.7 ?C) (Oral)   Resp 17   Ht $R'5\' 5"'Qa$  (1.651 m)   Wt 90.7 kg   LMP 08/04/2021 (Exact Date)   SpO2 97%   BMI 33.28 kg/m?  ?Physical Exam ?Constitutional:   ?   General: She is not in acute distress. ?HENT:  ?   Head: Normocephalic and atraumatic.  ?Eyes:  ?   Conjunctiva/sclera: Conjunctivae normal.  ?   Pupils: Pupils are equal, round, and reactive to light.  ?Cardiovascular:  ?   Rate and Rhythm: Normal rate and regular rhythm.  ?Pulmonary:  ?   Effort: Pulmonary effort is normal. No respiratory distress.  ?Abdominal:  ?   General: There is no distension.  ?   Tenderness: There is no abdominal tenderness.  ?Skin: ?   General: Skin is warm and dry.  ?Neurological:  ?   General: No focal deficit present.  ?   Mental Status: She is alert and oriented to person, place, and time. Mental status is at baseline.  ?   Sensory: No sensory deficit.  ?   Motor: No weakness.  ?   Comments: RUE: Full ROM at shoulder and elbow. ?Tenderness of the right distal forearm ?No snuffbox tenderness. ?No pain with axial loading of R thumb. ?No superficial tenderness (skin) of R palm. ?No tenderness of dorsal metacarpal bones. ?Intact opposition, finger abduction, and wrist flexion / extension. ?Questionable lower right facial droop  ?Psychiatric:     ?   Mood and Affect: Mood normal.     ?   Behavior: Behavior normal.  ? ? ?ED Results / Procedures / Treatments    ?Labs ?(all labs ordered are listed, but only abnormal results are displayed) ?Labs Reviewed - No data to display ? ?EKG ?None ? ?Radiology ?DG Wrist Complete Right ? ?Result Date: 08/21/2021 ?CLINICAL DATA:  Right wrist pain in the distal radius status post fall. EXAM: RIGHT WRIST - COMPLETE 3+ VIEW COMPARISON:  None. FINDINGS: No acute fracture or dislocation. No aggressive osseous lesion. Normal alignment. Soft tissue are unremarkable. No radiopaque foreign body or soft tissue emphysema. IMPRESSION: 1.  No acute osseous injury of the right wrist. Electronically Signed   By: [place] Service.D.  On: 08/21/2021 11:45  ? ?CT Head Wo Contrast ? ?Result Date: 08/21/2021 ?CLINICAL DATA:  TIA, paresthesias EXAM: CT HEAD WITHOUT CONTRAST TECHNIQUE: Contiguous axial images were obtained from the base of the skull through the vertex without intravenous contrast. RADIATION DOSE REDUCTION: This exam was performed according to the departmental dose-optimization program which includes automated exposure control, adjustment of the mA and/or kV according to patient size and/or use of iterative reconstruction technique. COMPARISON:  None. FINDINGS: Brain: No acute intracranial hemorrhage, mass effect, or herniation. No extra-axial fluid collections. No evidence of acute territorial infarct. No hydrocephalus. Vascular: No hyperdense vessel or unexpected calcification. Skull: Normal. Negative for fracture or focal lesion. Sinuses/Orbits: No acute finding. Other: None. IMPRESSION: No acute intracranial process identified. Electronically Signed   By: Ofilia Neas M.D.   On: 08/21/2021 11:59   ? ?Procedures ?Procedures  ? ? ?Medications Ordered in ED ?Medications - No data to display ? ?ED Course/ Medical Decision Making/ A&P ?Clinical Course as of 08/21/21 1536  ?Thu Aug 21, 2021  ?1243 Pros and cons discussion had with the patient about transfer to [facility] for MRI brain to complete stroke versus TIA rule out, although I do not  feel strongly about this given that it is not clear to either of [place] whether this is truly a facial droop that we are seeing.  I advised the alternative would be for her to stop wearing the wrist splint given

## 2021-08-22 ENCOUNTER — Other Ambulatory Visit: Payer: Self-pay

## 2021-08-26 ENCOUNTER — Encounter (HOSPITAL_COMMUNITY): Payer: Self-pay | Admitting: Physician Assistant

## 2021-08-26 ENCOUNTER — Ambulatory Visit (INDEPENDENT_AMBULATORY_CARE_PROVIDER_SITE_OTHER): Payer: No Payment, Other | Admitting: Physician Assistant

## 2021-08-26 ENCOUNTER — Other Ambulatory Visit: Payer: Self-pay

## 2021-08-26 DIAGNOSIS — F3181 Bipolar II disorder: Secondary | ICD-10-CM | POA: Diagnosis not present

## 2021-08-26 DIAGNOSIS — F410 Panic disorder [episodic paroxysmal anxiety] without agoraphobia: Secondary | ICD-10-CM

## 2021-08-26 DIAGNOSIS — F411 Generalized anxiety disorder: Secondary | ICD-10-CM

## 2021-08-26 MED ORDER — LAMOTRIGINE 25 MG PO TABS
50.0000 mg | ORAL_TABLET | Freq: Every day | ORAL | 2 refills | Status: DC
  Filled 2021-08-26 – 2021-10-03 (×2): qty 60, 30d supply, fill #0

## 2021-08-26 MED ORDER — ARIPIPRAZOLE 30 MG PO TABS
30.0000 mg | ORAL_TABLET | Freq: Every day | ORAL | 1 refills | Status: DC
  Filled 2021-08-26 – 2021-10-03 (×2): qty 30, 30d supply, fill #0

## 2021-08-26 MED ORDER — ESCITALOPRAM OXALATE 20 MG PO TABS
40.0000 mg | ORAL_TABLET | Freq: Every day | ORAL | 1 refills | Status: DC
Start: 2021-08-26 — End: 2021-10-21
  Filled 2021-08-26 – 2021-10-03 (×2): qty 60, 30d supply, fill #0

## 2021-08-26 MED ORDER — CLONAZEPAM 1 MG PO TABS: 1.0000 mg | ORAL_TABLET | Freq: Three times a day (TID) | ORAL | 1 refills | Status: DC | PRN

## 2021-08-26 NOTE — Progress Notes (Signed)
[facility] MD/PA/NP OP Progress Note ? ?08/26/2021 1:10 PM ?Danielle Barker  ?MRN:  [id number] ? ?Chief Complaint:  ?Chief Complaint  ?Patient presents with  ? Medication Refill  ? Follow-up  ? ?HPI:  ? ?Danielle Barker is a 46 year old female with a past psychiatric history significant for generalized anxiety disorder with panic attacks and bipolar 2 disorder who presents to [facility] for follow-up and medication management.  Patient is currently being managed on the following medications: ? ?Clonazepam 1 mg 3 times daily as needed ?Abilify 30 mg daily ?Escitalopram (Lexapro) 40 mg daily ?Lamotrigine 50 mg daily ? ?Patient reports that her medications appear to be working well.  She endorses good mood but does admit to occasionally experiencing sadness.  She reports that whenever she experiences sadness, it is not overwhelming as it has been in the past.  Patient endorses anxiety and rates her anxiety in 8 out of 10, especially at work.  Patient endorses work and financial related stressors.  Patient states that she has tried looking for more work to offset her financial instability, but she has experienced no such luck.  She reports that she also hurt her wrist while working on the job.  Patient reports that she is also going through a personal issue regarding her relationship she was involved in.  Patient reports that the individual she was involved with happened to be married and did not inform her.  A PHQ-9 screen was performed with the patient scoring a 6.  A GAD-7 screen was also performed with the patient scoring a 13. ? ?Patient is alert and oriented x4, pleasant, calm, cooperative, and fully engaged in conversation during the encounter. She endorses feeling melancholy denying either being high or too low.  Patient denies suicidal or homicidal ideations.  She further denies auditory or visual hallucinations and does not appear to be responding to internal/external stimuli.   Patient endorses fair sleep and receives on average 4 to 5 hours of sleep each night.  Patient states that she has a day off from work, then she often sleeps the whole day.  Patient endorses fair appetite and eats on average 1 meal per day along with a snack.  Patient denies alcohol consumption, tobacco use, and illicit drug use. ? ?Visit Diagnosis:  ?  ICD-10-CM   ?1. Bipolar 2 disorder (HCC)  F31.81 escitalopram (LEXAPRO) 20 MG tablet  ?  lamoTRIgine (LAMICTAL) 25 MG tablet  ?  ARIPiprazole (ABILIFY) 30 MG tablet  ?  ?2. Generalized anxiety disorder with panic attacks  F41.1 escitalopram (LEXAPRO) 20 MG tablet  ? F41.0 clonazePAM (KLONOPIN) 1 MG tablet  ?  ? ? ?Past Psychiatric History:  ?Insomnia ?Generalized anxiety disorder ?Bipolar 2 disorder ? ?Past Medical History:  ?Past Medical History:  ?Diagnosis Date  ? Diabetes mellitus without complication ([place])   ? High cholesterol   ? High triglycerides   ? Mood disorder ([place])   ?  ?Past Surgical History:  ?Procedure Laterality Date  ? APPENDECTOMY    ? CHOLECYSTECTOMY    ? SHOULDER SURGERY Left   ? TONSILLECTOMY    ? ? ?Family Psychiatric History:  ?Aunt (maternal) - schizophrenic ?Mother - unipolar/severe depression ?Father - Alcohol abuse, anxiety, possible bipolar but has not been diagnosed ?Biological Brother - anxious, paranoid ?Biological Sister - Anxiety, recovering alcoholic (sober for 4 years) ? ?Family History:  ?Family History  ?Problem Relation Age of Onset  ? Cancer Mother   ? Heart failure Mother   ?  Cancer Father   ? ? ?Social History:  ?Social History  ? ?Socioeconomic History  ? Marital status: Divorced  ?  Spouse name: Not on file  ? Number of children: Not on file  ? Years of education: Not on file  ? Highest education level: Not on file  ?Occupational History  ? Not on file  ?Tobacco Use  ? Smoking status: Never  ? Smokeless tobacco: Never  ?Vaping Use  ? Vaping Use: Never used  ?Substance and Sexual Activity  ? Alcohol use: Yes  ?  Comment:  rarely  ? Drug use: Never  ? Sexual activity: Yes  ?Other Topics Concern  ? Not on file  ?Social History Narrative  ? Not on file  ? ?Social Determinants of Health  ? ?Financial Resource Strain: Not on file  ?Food Insecurity: Not on file  ?Transportation Needs: Not on file  ?Physical Activity: Not on file  ?Stress: Not on file  ?Social Connections: Not on file  ? ? ?Allergies:  ?Allergies  ?Allergen Reactions  ? Compazine [Prochlorperazine]   ? Dilaudid [Hydromorphone]   ?  intolerence  ? Reglan [Metoclopramide]   ? Zofran [Ondansetron]   ? ? ?Metabolic Disorder Labs: ?Lab Results  ?Component Value Date  ? HGBA1C 8.4 (A) 07/02/2021  ? ?No results found for: PROLACTIN ?No results found for: CHOL, TRIG, HDL, CHOLHDL, VLDL, LDLCALC ?Lab Results  ?Component Value Date  ? TSH 3.727 03/05/2021  ? ? ?Therapeutic Level Labs: ?No results found for: LITHIUM ?No results found for: VALPROATE ?No components found for:  CBMZ ? ?Current Medications: ?Current Outpatient Medications  ?Medication Sig Dispense Refill  ? ARIPiprazole (ABILIFY) 30 MG tablet Take 1 tablet (30 mg total) by mouth daily. 30 tablet 1  ? Blood Glucose Monitoring Suppl (TRUE METRIX METER) w/Device KIT use kit to check blood glucose 3 three times daily before meals. 1 kit 0  ? [START ON 09/13/2021] clonazePAM (KLONOPIN) 1 MG tablet Take 1 tablet (1 mg total) by mouth 3 (three) times daily as needed for anxiety. 90 tablet 1  ? dicyclomine (BENTYL) 20 MG tablet Take 1 tablet (20 mg total) by mouth 2 (two) times daily. 20 tablet 0  ? escitalopram (LEXAPRO) 20 MG tablet Take 2 tablets (40 mg total) by mouth daily. 60 tablet 1  ? fenofibrate (TRICOR) 48 MG tablet Take 1 tablet (48 mg total) by mouth daily. 30 tablet 2  ? furosemide (LASIX) 40 MG tablet Take 1 tablet (40 mg total) by mouth 2 (two) times daily as needed. 15 tablet 0  ? gabapentin (NEURONTIN) 300 MG capsule Take 2 capsules (600 mg total) by mouth 3 (three) times daily. 180 capsule 3  ? glucose blood  (TRUE METRIX BLOOD GLUCOSE TEST) test strip Use 1 test strip to check blood glucose 3 times daily 100 each 12  ? hydrochlorothiazide (HYDRODIURIL) 12.5 MG tablet Take 1 tablet (12.5 mg total) by mouth daily for 5 days. 30 tablet 1  ? HYDROcodone-acetaminophen (NORCO) 5-325 MG tablet Take 1 tablet by mouth every 6 (six) hours as needed for moderate pain. 5 tablet 0  ? insulin aspart (NOVOLOG FLEXPEN) 100 UNIT/ML FlexPen Inject 0-12 Units into the skin 3 (three) times daily with meals. As per sliding scale 15 mL 11  ? insulin glargine (LANTUS) 100 UNIT/ML injection Inject 40 units in AM and 40 units in PM 10 mL 11  ? Insulin Syringe-Needle U-100 (TRUEPLUS INSULIN SYRINGE) 31G X 5/16" 1 ML MISC Use to inject Lantus  twice a day. 100 each 3  ? lamoTRIgine (LAMICTAL) 25 MG tablet Take 1 tablet (25 mg total) by mouth daily. 14 tablet 0  ? lamoTRIgine (LAMICTAL) 25 MG tablet Take 2 tablets (50 mg total) by mouth daily. 60 tablet 2  ? metFORMIN (GLUCOPHAGE-XR) 500 MG 24 hr tablet Take one daily with food for one week then two daily with food 60 tablet 4  ? naproxen (NAPROSYN) 500 MG tablet Take 1 tablet (500 mg total) by mouth 2 (two) times daily. 30 tablet 0  ? rosuvastatin (CRESTOR) 20 MG tablet Take 1 tablet (20 mg total) by mouth daily for 30 doses. 30 tablet 2  ? Semaglutide, 1 MG/DOSE, 4 MG/3ML SOPN Inject 1 mg as directed once a week. 3 mL 3  ? TRUEplus Lancets 28G MISC use 1 lancet to check blood glucose 3 times daily before meals. 100 each 12  ? valACYclovir (VALTREX) 1000 MG tablet Take 1 tablet (1,000 mg total) by mouth 2 (two) times daily. 14 tablet 0  ? ?No current facility-administered medications for this visit.  ? ? ? ?Musculoskeletal: ?Strength & Muscle Tone: within normal limits ?Gait & Station: normal ?Patient leans: N/A ? ?Psychiatric Specialty Exam: ?Review of Systems  ?Psychiatric/Behavioral:  Positive for sleep disturbance. Negative for decreased concentration, dysphoric mood, hallucinations,  self-injury and suicidal ideas. The patient is nervous/anxious. The patient is not hyperactive.    ?Last menstrual period 08/04/2021.There is no height or weight on file to calculate BMI.  ?General Appearance: Fair

## 2021-08-27 ENCOUNTER — Other Ambulatory Visit: Payer: Self-pay

## 2021-08-30 ENCOUNTER — Other Ambulatory Visit: Payer: Self-pay

## 2021-08-30 ENCOUNTER — Emergency Department (HOSPITAL_BASED_OUTPATIENT_CLINIC_OR_DEPARTMENT_OTHER)
Admission: EM | Admit: 2021-08-30 | Discharge: 2021-08-30 | Disposition: A | Discharge status: Home / Self Care | Payer: Self-pay | Attending: Emergency Medicine | Admitting: Emergency Medicine

## 2021-08-30 ENCOUNTER — Encounter (HOSPITAL_BASED_OUTPATIENT_CLINIC_OR_DEPARTMENT_OTHER): Payer: Self-pay

## 2021-08-30 DIAGNOSIS — E119 Type 2 diabetes mellitus without complications: Secondary | ICD-10-CM | POA: Insufficient documentation

## 2021-08-30 DIAGNOSIS — B009 Herpesviral infection, unspecified: Secondary | ICD-10-CM | POA: Insufficient documentation

## 2021-08-30 DIAGNOSIS — Z79899 Other long term (current) drug therapy: Secondary | ICD-10-CM | POA: Insufficient documentation

## 2021-08-30 DIAGNOSIS — Z7984 Long term (current) use of oral hypoglycemic drugs: Secondary | ICD-10-CM | POA: Insufficient documentation

## 2021-08-30 DIAGNOSIS — Z794 Long term (current) use of insulin: Secondary | ICD-10-CM | POA: Insufficient documentation

## 2021-08-30 DIAGNOSIS — Z760 Encounter for issue of repeat prescription: Secondary | ICD-10-CM | POA: Insufficient documentation

## 2021-08-30 HISTORY — DX: Herpesviral infection, unspecified: B00.9

## 2021-08-30 MED ORDER — VALACYCLOVIR HCL 1 G PO TABS: 1000.0000 mg | ORAL_TABLET | Freq: Two times a day (BID) | ORAL | 0 refills | Status: DC

## 2021-08-30 NOTE — ED Provider Notes (Signed)
?[facility] EMERGENCY DEPT ?Provider Note ? ? ?CSN: [id number] ?Arrival date & time: 08/30/21  0134 ? ?  ? ?History ? ?Chief Complaint  ?Patient presents with  ? Medication Refill  ? ? ?Danielle Barker is a 46 y.o. female. ? ?HPI ? ?  ? ?This is a 46 year old female who presents requesting medication refill.  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  She was seen and evaluated in mid April with a recurrent outbreak.  She was treated with a 7-day course of Valtrex.  Patient reports that her lesions never really went away.  She finished that course approximately 1 week ago.  She is concerned that she has ongoing lesions.  She used to take suppressive therapy but did not have an outbreak for 10 years until recently when she has had recurrent outbreaks.  She reports increased stressors at home.  She denies any recent sexual activity since last outbreak.  Denies new sexual partners.  Patient is requesting medication refill.  She is unable to get into her primary physician. ? ?Home Medications ?Prior to Admission medications   ?Medication Sig Start Date End Date Taking? Authorizing Provider  ?ARIPiprazole (ABILIFY) 30 MG tablet Take 1 tablet (30 mg total) by mouth daily. 08/26/21   Malachy Mood, PA  ?Blood Glucose Monitoring Suppl (TRUE METRIX METER) w/Device KIT use kit to check blood glucose 3 three times daily before meals. 03/31/21   Charlott Rakes, MD  ?clonazePAM (KLONOPIN) 1 MG tablet Take 1 tablet (1 mg total) by mouth 3 (three) times daily as needed for anxiety. 09/13/21 09/13/22  Malachy Mood, PA  ?dicyclomine (BENTYL) 20 MG tablet Take 1 tablet (20 mg total) by mouth 2 (two) times daily. 02/28/21   Hazel Sams, PA-C  ?escitalopram (LEXAPRO) 20 MG tablet Take 2 tablets (40 mg total) by mouth daily. 08/26/21   Nwoko, Terese Door, PA  ?fenofibrate (TRICOR) 48 MG tablet Take 1 tablet (48 mg total) by mouth daily. 04/17/21 08/02/21  Charlott Rakes, MD  ?furosemide (LASIX) 40 MG tablet Take 1 tablet  (40 mg total) by mouth 2 (two) times daily as needed. 03/23/21   Blanchie Dessert, MD  ?gabapentin (NEURONTIN) 300 MG capsule Take 2 capsules (600 mg total) by mouth 3 (three) times daily. 08/05/21 09/06/21  Charlott Rakes, MD  ?glucose blood (TRUE METRIX BLOOD GLUCOSE TEST) test strip Use 1 test strip to check blood glucose 3 times daily 03/31/21   Charlott Rakes, MD  ?hydrochlorothiazide (HYDRODIURIL) 12.5 MG tablet Take 1 tablet (12.5 mg total) by mouth daily for 5 days. 03/31/21 08/02/21  Charlott Rakes, MD  ?HYDROcodone-acetaminophen (NORCO) 5-325 MG tablet Take 1 tablet by mouth every 6 (six) hours as needed for moderate pain. 08/19/21   Nailani Full, Alvin Critchley, DO  ?insulin aspart (NOVOLOG FLEXPEN) 100 UNIT/ML FlexPen Inject 0-12 Units into the skin 3 (three) times daily with meals. As per sliding scale 03/31/21   Charlott Rakes, MD  ?insulin glargine (LANTUS) 100 UNIT/ML injection Inject 40 units in AM and 40 units in PM 06/16/21   Elsie Stain, MD  ?Insulin Syringe-Needle U-100 (TRUEPLUS INSULIN SYRINGE) 31G X 5/16" 1 ML MISC Use to inject Lantus twice a day. 05/26/21   Charlott Rakes, MD  ?lamoTRIgine (LAMICTAL) 25 MG tablet Take 1 tablet (25 mg total) by mouth daily. 07/15/21 07/15/22  Malachy Mood, PA  ?lamoTRIgine (LAMICTAL) 25 MG tablet Take 2 tablets (50 mg total) by mouth daily. 08/26/21 08/26/22  Malachy Mood, PA  ?  metFORMIN (GLUCOPHAGE-XR) 500 MG 24 hr tablet Take one daily with food for one week then two daily with food 06/16/21   Elsie Stain, MD  ?naproxen (NAPROSYN) 500 MG tablet Take 1 tablet (500 mg total) by mouth 2 (two) times daily. 09/18/20   Maudie Flakes, MD  ?rosuvastatin (CRESTOR) 20 MG tablet Take 1 tablet (20 mg total) by mouth daily for 30 doses. 04/17/21 08/02/21  Charlott Rakes, MD  ?Semaglutide, 1 MG/DOSE, 4 MG/3ML SOPN Inject 1 mg as directed once a week. 07/07/21   Charlott Rakes, MD  ?TRUEplus Lancets 28G MISC use 1 lancet to check blood glucose 3 times daily before meals.  03/31/21   Charlott Rakes, MD  ?valACYclovir (VALTREX) 1000 MG tablet Take 1 tablet (1,000 mg total) by mouth 2 (two) times daily. 08/30/21   Devarious Pavek, Barbette Hair, MD  ?citalopram (CELEXA) 40 MG tablet Take 40 mg by mouth daily.  11/25/20  [provider]  ?   ? ?Allergies    ?Compazine [prochlorperazine], Dilaudid [hydromorphone], Reglan [metoclopramide], and Zofran [ondansetron]   ? ?Review of Systems   ?Review of Systems  ?Genitourinary:  Positive for vaginal pain.  ?All other systems reviewed and are negative. ? ?Physical Exam ?Updated Vital Signs ?BP 137/83   Pulse 85   Temp 98 ?F (36.7 ?C) (Oral)   Resp 16   LMP 08/04/2021 (Exact Date)   SpO2 98%  ?Physical Exam ?Vitals and nursing note reviewed.  ?Constitutional:   ?   Appearance: She is well-developed. She is obese. She is not ill-appearing.  ?HENT:  ?   Head: Normocephalic and atraumatic.  ?Eyes:  ?   Pupils: Pupils are equal, round, and reactive to light.  ?Cardiovascular:  ?   Rate and Rhythm: Normal rate and regular rhythm.  ?Pulmonary:  ?   Effort: Pulmonary effort is normal. No respiratory distress.  ?Musculoskeletal:  ?   Cervical back: Neck supple.  ?Skin: ?   General: Skin is warm and dry.  ?Neurological:  ?   Mental Status: She is alert and oriented to person, place, and time.  ?Psychiatric:     ?   Mood and Affect: Mood normal.  ? ? ?ED Results / Procedures / Treatments   ?Labs ?(all labs ordered are listed, but only abnormal results are displayed) ?Labs Reviewed - No data to display ? ?EKG ?None ? ?Radiology ?No results found. ? ?Procedures ?Procedures  ? ? ?Medications Ordered in ED ?Medications - No data to display ? ?ED Course/ Medical Decision Making/ A&P ?  ?                        ?Medical Decision Making ?Risk ?Prescription drug management. ? ? ?This patient presents to the ED for concern of vaginal lesions, ongoing HSV outbreak, this involves an extensive number of treatment options, and is a complaint that carries with it a high  risk of complications and morbidity.  The differential diagnosis includes HSV, other STD ? ?MDM:   ? ?This is a 46 year old female who presents with ongoing genital lesions concerning for ongoing HSV outbreak.  She is nontoxic.  Vital signs are reassuring.  Patient declined repeat GU exam.  I reviewed her chart from her last visit.  She had STD testing at that time.  Patient reports that her lesions are consistent with her prior HSV.  Will provide an additional 10-day course of valacyclovir.  Recommend that she follow-up with her primary physician to  start suppressive therapy. ?Admission considered for N/A ?(Labs, imaging, consults) ? ?Labs: ?I Ordered, and personally interpreted labs.  The pertinent results include: None ? ?Imaging Studies ordered: ?I ordered imaging studies including none ?I independently visualized and interpreted imaging. ?I agree with the radiologist interpretation ? ?Additional history obtained from chart.  External records from outside source obtained and reviewed including recent STD and negative gonorrhea and Chlamydia testing ? ?Cardiac Monitoring: ?The patient was maintained on a cardiac monitor.  I personally viewed and interpreted the cardiac monitored which showed an underlying rhythm of: Sinus rhythm ? ?Reevaluation: ?After the interventions noted above, I reevaluated the patient and found that they have :stayed the same ? ?Social Determinants of Health: ?Lives independently ? ?Disposition: Discharge ? ?Co morbidities that complicate the patient evaluation ? ?Past Medical History:  ?Diagnosis Date  ? Diabetes mellitus without complication ([place])   ? High cholesterol   ? High triglycerides   ? HSV (herpes simplex virus) infection   ? Mood disorder ([place])   ?  ? ?Medicines ?Meds ordered this encounter  ?Medications  ? valACYclovir (VALTREX) 1000 MG tablet  ?  Sig: Take 1 tablet (1,000 mg total) by mouth 2 (two) times daily.  ?  Dispense:  20 tablet  ?  Refill:  0  ?  ?I have reviewed the  patients home medicines and have made adjustments as needed ? ?Problem List / ED Course: ?Problem List Items Addressed This Visit   ?None ?Visit Diagnoses   ? ? HSV (herpes simplex virus) infection    -  Pri

## 2021-08-30 NOTE — Discharge Instructions (Addendum)
You were seen today for medication refill.  You will be given a 10-day course of Valtrex.  Follow-up with your primary doctor for suppressive therapy. ?

## 2021-08-30 NOTE — ED Triage Notes (Signed)
Pt states that she had a HSV outbreak 3 weeks ago and was given medication but outbreak continues.  ?

## 2021-08-30 NOTE — ED Notes (Signed)
Pt verbalizes understanding of discharge instructions. Opportunity for questioning and answers were provided. Pt discharged from ED to home.   ? ?

## 2021-09-01 ENCOUNTER — Other Ambulatory Visit: Payer: Self-pay

## 2021-09-01 ENCOUNTER — Ambulatory Visit: Payer: Self-pay | Admitting: *Deleted

## 2021-09-01 NOTE — Telephone Encounter (Signed)
?  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  Seen ED 08/30/2021.    ?Symptoms: HSV II flaring up.  Given Valtrex for 10 days but needing suppressive treatment to prevent future flare ups ?Frequency: Now but getting better since starting the Valtrex. ?Pertinent Negatives: Patient denies N/A ?Disposition: [] ED /[] Urgent Care (no appt availability in office) / [x] Appointment(In office/virtual)/ []  [facility]/ [] Home Care/ [] Refused Recommended Disposition /[] [facility]/ []  Follow-up with PCP ?Additional Notes: [facility] appt made for 09/24/2021 at 3:50 with , PA  ?

## 2021-09-01 NOTE — Telephone Encounter (Signed)
I returned pt's call.   She has HSV II.  Has been to ED twice recently 08/10/2021 and 08/30/2021 for treatment.  Been recommended she follow up with her PCP for continued treatment.   No appts available at [facility].  Needing an appt. ? ? ?Reason for Disposition ? [1] SEVERE pain (e.g., excruciating, pain scale 8-10) AND [2] not improved after pain medications ? ?Answer Assessment - Initial Assessment Questions ?1. MAIN CONCERN OR SYMPTOM:  "What is your main concern right now?" "What question do you have?" "What's the main symptom you're worried about?" (e.g., breathing difficulty, cough, fever. pain) ?    Pt seen in ED twice recently for HSV II.   Needs hospital follow up appt with PCP ASAP for continued treatment but there are no appts available at [facility]. ?2. ONSET: "When did the  Seen over the weekend  start?" ?    HSV II.   Medication prescribed but needs ongoing treatment from PCP. ?3. BETTER-SAME-WORSE: "Are you getting better, staying the same, or getting worse compared to how you felt at your last visit to the doctor (most recent medical visit)?" ?    Just seen over the weekend in ED ?4. VISIT DATE: "When were you seen?" (Date) ?    08/30/2021 ?5. VISIT DOCTOR: "What is the name of the doctor taking care of you now?" ?    Dr. Hoy Register ?6. VISIT DIAGNOSIS:  "What was the main symptom or problem that you were seen for?" "Were you given a diagnosis?"  ?    HSV II ?7. VISIT MEDICINES: "Did the doctor order any new medicines for you to use?" If Yes, ask: "Have you filled the prescription and started taking the medicine?"  ?    Yes Valtrex for 10 days.     ?8. NEXT APPOINTMENT: "Have you scheduled a follow-up appointment with your doctor?" ?    No that is what I'm needing ?9. PAIN: "Is there any pain?" If Yes, ask: "How bad is it?"  (Scale 0-10; or mild, moderate, severe) ?   - NONE (0): no pain ?   - MILD (1-3): doesn't interfere with normal activities  ?   - MODERATE  (4-7): interferes with normal activities or awakens from sleep  ?   - SEVERE (8-10): excruciating pain, unable to do any normal activities ?    Not asked ?10. FEVER: "Do you have a fever?" If Yes, ask: "What is it, how was it measured  and when did it start?" ?      Not asked since seen in ED over the weekend ?11. OTHER SYMPTOMS: "Do you have any other symptoms?" ?      Not asked ? ?Protocols used: Recent Medical Visit for Illness Follow-up Call-A-[facility] ? ?

## 2021-09-04 ENCOUNTER — Other Ambulatory Visit: Payer: Self-pay

## 2021-09-09 ENCOUNTER — Other Ambulatory Visit: Payer: Self-pay

## 2021-09-11 ENCOUNTER — Other Ambulatory Visit: Payer: Self-pay

## 2021-09-24 ENCOUNTER — Inpatient Hospital Stay: Payer: Self-pay | Admitting: Physician Assistant

## 2021-09-26 ENCOUNTER — Other Ambulatory Visit: Payer: Self-pay

## 2021-09-26 ENCOUNTER — Emergency Department (HOSPITAL_COMMUNITY): Payer: Self-pay

## 2021-09-26 ENCOUNTER — Emergency Department (HOSPITAL_COMMUNITY)
Admission: EM | Admit: 2021-09-26 | Discharge: 2021-09-26 | Disposition: A | Discharge status: Home / Self Care | Payer: Self-pay | Attending: Emergency Medicine | Admitting: Emergency Medicine

## 2021-09-26 ENCOUNTER — Encounter (HOSPITAL_COMMUNITY): Payer: Self-pay

## 2021-09-26 DIAGNOSIS — R059 Cough, unspecified: Secondary | ICD-10-CM | POA: Insufficient documentation

## 2021-09-26 DIAGNOSIS — J029 Acute pharyngitis, unspecified: Secondary | ICD-10-CM | POA: Insufficient documentation

## 2021-09-26 DIAGNOSIS — Z5321 Procedure and treatment not carried out due to patient leaving prior to being seen by health care provider: Secondary | ICD-10-CM | POA: Insufficient documentation

## 2021-09-26 LAB — CBC
HCT: 37.9 % (ref 36.0–46.0)
Hemoglobin: 12.6 g/dL (ref 12.0–15.0)
MCH: 31.2 pg (ref 26.0–34.0)
MCHC: 33.2 g/dL (ref 30.0–36.0)
MCV: 93.8 fL (ref 80.0–100.0)
Platelets: 277 10*3/uL (ref 150–400)
RBC: 4.04 MIL/uL (ref 3.87–5.11)
RDW: 13 % (ref 11.5–15.5)
WBC: 4.9 10*3/uL (ref 4.0–10.5)
nRBC: 0 % (ref 0.0–0.2)

## 2021-09-26 LAB — BASIC METABOLIC PANEL
Anion gap: 8 (ref 5–15)
BUN: 20 mg/dL (ref 6–20)
CO2: 27 mmol/L (ref 22–32)
Calcium: 9 mg/dL (ref 8.9–10.3)
Chloride: 102 mmol/L (ref 98–111)
Creatinine, Ser: 0.83 mg/dL (ref 0.44–1.00)
GFR, Estimated: >60 mL/min (ref >60)
Glucose, Bld: 220 mg/dL — ABNORMAL HIGH (ref 70–99)
Potassium: 4.4 mmol/L (ref 3.5–5.1)
Sodium: 137 mmol/L (ref 135–145)

## 2021-09-26 NOTE — ED Triage Notes (Signed)
Patient c/o SOB and facial numbness that started today. Patient states she has had fatigue, sore throat and a productive cough with green sputum x 3 days

## 2021-09-26 NOTE — ED Provider Triage Note (Signed)
,  Emergency Medicine Provider Triage Evaluation Note  Danielle Barker , a 46 y.o. female  was evaluated in triage.  Pt complains of cough, sore throat, feeling of shortness of breath, chest tightness endorses some pressure, swollen tongue sensation.  She initially endorsed "numbness of the cheeks" and talking to the triage nurse.  On further clarification she reports more of a tingling sensation and no numbness, facial droop..  Review of Systems  Positive: Cough, sore throat, shob Negative: Chest pain  Physical Exam  BP 121/83 (BP Location: Left Arm)   Pulse (!) 101   Temp 98 F (36.7 C)   Resp 16   Ht 5\' 5"  (1.651 m)   Wt 92.1 kg   LMP 09/02/2021   SpO2 98%   BMI 33.78 kg/m  Gen:   Awake, no distress   Resp:  Normal effort  MSK:   Moves extremities without difficulty  Other:  Patient without tonsils, she has some minimal posterior oropharynx erythema, no significant cervical adenopathy.  Normal respiration bilaterally, no wheezing, rhonchi, focal consolidation.  Medical Decision Making  Medically screening exam initiated at 5:53 PM.  Appropriate orders placed.  Danielle Barker was informed that the remainder of the evaluation will be completed by another provider, this initial triage assessment does not replace that evaluation, and the importance of remaining in the ED until their evaluation is complete.  Workup initiated   Carmelina Dane, Olene Floss 09/26/21 1754

## 2021-10-03 ENCOUNTER — Other Ambulatory Visit: Payer: Self-pay

## 2021-10-09 ENCOUNTER — Other Ambulatory Visit: Payer: Self-pay

## 2021-10-13 ENCOUNTER — Ambulatory Visit: Payer: Self-pay | Admitting: Family Medicine

## 2021-10-21 ENCOUNTER — Other Ambulatory Visit: Payer: Self-pay

## 2021-10-21 ENCOUNTER — Telehealth (HOSPITAL_COMMUNITY): Payer: Self-pay | Admitting: Physician Assistant

## 2021-10-21 ENCOUNTER — Encounter (HOSPITAL_COMMUNITY): Payer: Self-pay

## 2021-10-21 ENCOUNTER — Encounter (HOSPITAL_COMMUNITY): Payer: Self-pay | Admitting: Physician Assistant

## 2021-10-21 ENCOUNTER — Other Ambulatory Visit (HOSPITAL_COMMUNITY): Payer: Self-pay | Admitting: Psychiatry

## 2021-10-21 DIAGNOSIS — F410 Panic disorder [episodic paroxysmal anxiety] without agoraphobia: Secondary | ICD-10-CM

## 2021-10-21 DIAGNOSIS — F3181 Bipolar II disorder: Secondary | ICD-10-CM

## 2021-10-21 MED ORDER — LAMOTRIGINE 25 MG PO TABS
50.0000 mg | ORAL_TABLET | Freq: Every day | ORAL | 3 refills | Status: DC
  Filled 2021-10-21 – 2021-11-10 (×2): qty 60, 30d supply, fill #0

## 2021-10-21 MED ORDER — ARIPIPRAZOLE 30 MG PO TABS
30.0000 mg | ORAL_TABLET | Freq: Every day | ORAL | 3 refills | Status: DC
  Filled 2021-10-21 – 2021-11-10 (×2): qty 30, 30d supply, fill #0
  Filled ????-??-??: fill #0

## 2021-10-21 MED ORDER — CLONAZEPAM 1 MG PO TABS: 1.0000 mg | ORAL_TABLET | Freq: Three times a day (TID) | ORAL | 1 refills | Status: DC | PRN

## 2021-10-21 MED ORDER — ESCITALOPRAM OXALATE 20 MG PO TABS
40.0000 mg | ORAL_TABLET | Freq: Every day | ORAL | 3 refills | Status: DC
  Filled 2021-10-21 – 2021-11-10 (×2): qty 60, 30d supply, fill #0

## 2021-10-21 NOTE — Telephone Encounter (Signed)
Provider is out today pt. Is going to need her scripts sent to the pharmacy. She said that provider calls her Klonopin in to the pharmacy.

## 2021-11-04 ENCOUNTER — Other Ambulatory Visit: Payer: Self-pay

## 2021-11-04 ENCOUNTER — Other Ambulatory Visit: Payer: Self-pay | Admitting: Family Medicine

## 2021-11-04 ENCOUNTER — Other Ambulatory Visit: Payer: Self-pay | Admitting: Critical Care Medicine

## 2021-11-04 DIAGNOSIS — Z794 Long term (current) use of insulin: Secondary | ICD-10-CM

## 2021-11-04 DIAGNOSIS — E1169 Type 2 diabetes mellitus with other specified complication: Secondary | ICD-10-CM

## 2021-11-04 DIAGNOSIS — R600 Localized edema: Secondary | ICD-10-CM

## 2021-11-05 ENCOUNTER — Other Ambulatory Visit: Payer: Self-pay

## 2021-11-05 MED ORDER — OZEMPIC (1 MG/DOSE) 4 MG/3ML ~~LOC~~ SOPN
1.0000 mg | PEN_INJECTOR | SUBCUTANEOUS | 0 refills | Status: DC
Start: 2021-11-05 — End: 2021-12-22
  Filled 2021-11-05 – 2021-11-21 (×2): qty 3, 28d supply, fill #0

## 2021-11-05 MED ORDER — HYDROCHLOROTHIAZIDE 12.5 MG PO TABS
12.5000 mg | ORAL_TABLET | Freq: Every day | ORAL | 0 refills | Status: DC
  Filled 2021-11-05: qty 30, 30d supply, fill #0

## 2021-11-05 MED ORDER — INSULIN GLARGINE 100 UNIT/ML ~~LOC~~ SOLN
40.0000 [IU] | Freq: Two times a day (BID) | SUBCUTANEOUS | 0 refills | Status: DC
  Filled 2021-11-05 – 2021-12-22 (×2): qty 20, 25d supply, fill #0

## 2021-11-10 ENCOUNTER — Other Ambulatory Visit: Payer: Self-pay

## 2021-11-10 MED ORDER — ARIPIPRAZOLE 15 MG PO TABS
30.0000 mg | ORAL_TABLET | Freq: Every day | ORAL | 3 refills | Status: DC
Start: 2021-10-21 — End: 2021-12-12
  Filled 2021-11-10 (×2): qty 30, 15d supply, fill #0

## 2021-11-11 ENCOUNTER — Other Ambulatory Visit: Payer: Self-pay

## 2021-11-13 ENCOUNTER — Other Ambulatory Visit: Payer: Self-pay

## 2021-11-17 ENCOUNTER — Other Ambulatory Visit: Payer: Self-pay

## 2021-11-21 ENCOUNTER — Other Ambulatory Visit: Payer: Self-pay

## 2021-12-11 ENCOUNTER — Ambulatory Visit (INDEPENDENT_AMBULATORY_CARE_PROVIDER_SITE_OTHER): Payer: No Payment, Other | Admitting: Licensed Clinical Social Worker

## 2021-12-11 DIAGNOSIS — F410 Panic disorder [episodic paroxysmal anxiety] without agoraphobia: Secondary | ICD-10-CM

## 2021-12-11 DIAGNOSIS — F411 Generalized anxiety disorder: Secondary | ICD-10-CM
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
## 2021-12-12 ENCOUNTER — Ambulatory Visit (INDEPENDENT_AMBULATORY_CARE_PROVIDER_SITE_OTHER): Payer: No Payment, Other | Admitting: Physician Assistant

## 2021-12-12 ENCOUNTER — Encounter (HOSPITAL_COMMUNITY): Payer: Self-pay | Admitting: Physician Assistant

## 2021-12-12 DIAGNOSIS — F3181 Bipolar II disorder: Secondary | ICD-10-CM | POA: Diagnosis not present

## 2021-12-12 DIAGNOSIS — F411 Generalized anxiety disorder: Secondary | ICD-10-CM | POA: Diagnosis not present

## 2021-12-12 DIAGNOSIS — F410 Panic disorder [episodic paroxysmal anxiety] without agoraphobia: Secondary | ICD-10-CM | POA: Diagnosis not present

## 2021-12-12 MED ORDER — LAMOTRIGINE 100 MG PO TABS
100.0000 mg | ORAL_TABLET | Freq: Every day | ORAL | 2 refills | Status: DC
  Filled 2021-12-12 – 2021-12-22 (×2): qty 30, 30d supply, fill #0
  Filled 2022-01-19: qty 30, 30d supply, fill #1

## 2021-12-12 MED ORDER — ARIPIPRAZOLE 30 MG PO TABS
30.0000 mg | ORAL_TABLET | Freq: Every day | ORAL | 2 refills | Status: DC
  Filled 2021-12-12 – 2021-12-22 (×2): qty 30, 30d supply, fill #0
  Filled 2022-01-19: qty 30, 30d supply, fill #1

## 2021-12-12 MED ORDER — ESCITALOPRAM OXALATE 20 MG PO TABS
40.0000 mg | ORAL_TABLET | Freq: Every day | ORAL | 2 refills | Status: DC
  Filled 2021-12-12 – 2021-12-22 (×2): qty 60, 30d supply, fill #0
  Filled 2022-01-19 – 2022-02-02 (×2): qty 60, 30d supply, fill #1

## 2021-12-12 NOTE — Progress Notes (Unsigned)
BH MD/PA/NP OP Progress Note  12/12/2021 7:30 PM Danielle Barker  MRN:  [id number]  Chief Complaint:  Chief Complaint  Patient presents with   Follow-up   Medication Management   HPI:   Danielle Barker is a 46 year old female with a past psychiatric history significant for generalized anxiety disorder with panic attacks and bipolar 2 disorder who presents to [facility] for follow-up and medication management.  Patient is currently being managed on the following medications:  Clonazepam 1 mg 3 times daily as needed Abilify 30 mg daily Escitalopram (Lexapro) 40 mg daily Lamotrigine 50 mg daily  Patient reports that she has been feeling more jumpy, restless, anxious, and nervous.  Patient rates her anxiety at 10 out of 10 and states that her clonazepam does not take the edge off her anxiety.  Due to her elevated anxiety, patient is unable to relax.  Patient reports that she is interested in being placed on a different type of benzodiazepine to help with her anxiety.  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  Due to her anxiety and stressors, patient feels like she is always firing on 8 cylinders.  A GAD-7 screen was performed with the patient scoring a 21.  Patient is alert and oriented x4, calm, cooperative, and fully engaged in conversation during the encounter.  Patient reports that she feels hyper and overwhelmed.  Patient denies suicidal or homicidal ideation.  She further denies auditory or visual hallucinations and does not appear to be responding to internal/external stimuli.  Patient endorses good sleep and receives on average 5 to 7 hours of sleep each night.  Patient endorses fair appetite and eats on average 2 meals per day.  Patient denies alcohol consumption, tobacco use, and illicit drug use.  Visit Diagnosis:    ICD-10-CM   1. Bipolar 2  disorder (HCC)  F31.81 ARIPiprazole (ABILIFY) 30 MG tablet    lamoTRIgine (LAMICTAL) 100 MG tablet    escitalopram (LEXAPRO) 20 MG tablet    2. Generalized anxiety disorder with panic attacks  F41.1 escitalopram (LEXAPRO) 20 MG tablet   F41.0       Past Psychiatric History:  Insomnia Generalized anxiety disorder Bipolar 2 disorder  Past Medical History:  Past Medical History:  Diagnosis Date   Diabetes mellitus without complication (HCC)    High cholesterol    High triglycerides    HSV (herpes simplex virus) infection    Mood disorder (HCC)     Past Surgical History:  Procedure Laterality Date   APPENDECTOMY     CHOLECYSTECTOMY     SHOULDER SURGERY Left    TONSILLECTOMY      Family Psychiatric History:  Aunt (maternal) - schizophrenic Mother - unipolar/severe depression Father - Alcohol abuse, anxiety, possible bipolar but has not been diagnosed Technical brewer - anxious, paranoid Biological Sister - Anxiety, recovering alcoholic (sober for 4 years)  Family History:  Family History  Problem Relation Age of Onset   Cancer Mother    Heart failure Mother    Cancer Father     Social History:  Social History   Socioeconomic History   Marital status: Divorced    Spouse name: Not on file   Number of children: Not on file   Years of education: Not on file   Highest education level: Not on file  Occupational History   Not on file  Tobacco Use  Smoking status: Never   Smokeless tobacco: Never  Vaping Use   Vaping Use: Never used  Substance and Sexual Activity   Alcohol use: Yes    Comment: rarely   Drug use: Never   Sexual activity: Yes  Other Topics Concern   Not on file  Social History Narrative   Not on file   Social Determinants of Health   Financial Resource Strain: Not on file  Food Insecurity: Not on file  Transportation Needs: Not on file  Physical Activity: Not on file  Stress: Not on file  Social Connections: Not on file     Allergies:  Allergies  Allergen Reactions   Compazine [Prochlorperazine]    Dilaudid [Hydromorphone]     intolerence   Reglan [Metoclopramide]    Zofran [Ondansetron]     Metabolic Disorder Labs: Lab Results  Component Value Date   HGBA1C 8.4 (A) 07/02/2021   No results found for: "PROLACTIN" No results found for: "CHOL", "TRIG", "HDL", "CHOLHDL", "VLDL", "LDLCALC" Lab Results  Component Value Date   TSH 3.727 03/05/2021    Therapeutic Level Labs: No results found for: "LITHIUM" No results found for: "VALPROATE" No results found for: "CBMZ"  Current Medications: Current Outpatient Medications  Medication Sig Dispense Refill   ARIPiprazole (ABILIFY) 30 MG tablet Take 1 tablet (30 mg total) by mouth daily. 30 tablet 2   Blood Glucose Monitoring Suppl (TRUE METRIX METER) w/Device KIT use kit to check blood glucose 3 three times daily before meals. 1 kit 0   clonazePAM (KLONOPIN) 1 MG tablet Take 1 tablet (1 mg total) by mouth 3 (three) times daily as needed for anxiety. 90 tablet 1   dicyclomine (BENTYL) 20 MG tablet Take 1 tablet (20 mg total) by mouth 2 (two) times daily. 20 tablet 0   escitalopram (LEXAPRO) 20 MG tablet Take 2 tablets (40 mg total) by mouth daily. 60 tablet 2   fenofibrate (TRICOR) 48 MG tablet Take 1 tablet (48 mg total) by mouth daily. 30 tablet 2   furosemide (LASIX) 40 MG tablet Take 1 tablet (40 mg total) by mouth 2 (two) times daily as needed. 15 tablet 0   gabapentin (NEURONTIN) 300 MG capsule Take 2 capsules (600 mg total) by mouth 3 (three) times daily. 180 capsule 3   glucose blood (TRUE METRIX BLOOD GLUCOSE TEST) test strip Use 1 test strip to check blood glucose 3 times daily 100 each 12   hydrochlorothiazide (HYDRODIURIL) 12.5 MG tablet Take 1 tablet (12.5 mg total) by mouth daily (Must have office visit for refills) 30 tablet 0   HYDROcodone-acetaminophen (NORCO) 5-325 MG tablet Take 1 tablet by mouth every 6 (six) hours as needed for  moderate pain. 5 tablet 0   insulin aspart (NOVOLOG FLEXPEN) 100 UNIT/ML FlexPen Inject 0-12 Units into the skin 3 (three) times daily with meals. As per sliding scale 15 mL 11   insulin glargine (LANTUS) 100 UNIT/ML injection Inject 0.4 mLs (40 Units total) into the skin 2 (two) times daily. 20 mL 0   Insulin Syringe-Needle U-100 (TRUEPLUS INSULIN SYRINGE) 31G X 5/16" 1 ML MISC Use to inject Lantus twice a day. 100 each 3   lamoTRIgine (LAMICTAL) 100 MG tablet Take 1 tablet (100 mg total) by mouth daily. 30 tablet 2   metFORMIN (GLUCOPHAGE-XR) 500 MG 24 hr tablet Take one daily with food for one week then two daily with food 60 tablet 4   naproxen (NAPROSYN) 500 MG tablet Take 1 tablet (500 mg total)  by mouth 2 (two) times daily. 30 tablet 0   rosuvastatin (CRESTOR) 20 MG tablet Take 1 tablet (20 mg total) by mouth daily for 30 doses. 30 tablet 2   Semaglutide, 1 MG/DOSE, (OZEMPIC, 1 MG/DOSE,) 4 MG/3ML SOPN Inject 1 mg as directed once a week. 3 mL 0   TRUEplus Lancets 28G MISC use 1 lancet to check blood glucose 3 times daily before meals. 100 each 12   valACYclovir (VALTREX) 1000 MG tablet Take 1 tablet (1,000 mg total) by mouth 2 (two) times daily. 20 tablet 0   No current facility-administered medications for this visit.     Musculoskeletal: Strength & Muscle Tone: within normal limits Gait & Station: normal Patient leans: N/A  Psychiatric Specialty Exam: Review of Systems  Psychiatric/Behavioral:  Negative for decreased concentration, dysphoric mood, hallucinations, self-injury, sleep disturbance and suicidal ideas. The patient is nervous/anxious. The patient is not hyperactive.     There were no vitals taken for this visit.There is no height or weight on file to calculate BMI.  General Appearance: Fairly Groomed  Eye Contact:  Good  Speech:  Clear and Coherent and Normal Rate  Volume:  Normal  Mood:  Anxious and Euthymic  Affect:  Congruent  Thought Process:  Coherent, Goal  Directed, and Descriptions of Associations: Intact  Orientation:  Full (Time, Place, and Person)  Thought Content: WDL   Suicidal Thoughts:  No  Homicidal Thoughts:  No  Memory:  Immediate;   Good Recent;   Good Remote;   Good  Judgement:  Good  Insight:  Good  Psychomotor Activity:  Normal  Concentration:  Concentration: Good and Attention Span: Good  Recall:  Good  Fund of Knowledge: Good  Language: Good  Akathisia:  No  Handed:  Right  AIMS (if indicated): not done  Assets:  Communication Skills Desire for Improvement Financial Resources/Insurance Housing Social Support Transportation Vocational/Educational  ADL's:  Intact  Cognition: WNL  Sleep:  Good   Screenings: GAD-7    Flowsheet Row Office Visit from [facility]/12/20[facility] Office Visit from 08/26/2021 in [facility] Office Visit from 07/15/2021 in [facility] Office Visit from 06/16/2021 in [place] from 05/15/2021 in [facility]  Total GAD-7 Score $RemoveBef'21 13 15 6 20      '[id number]$ PHQ2-9    [place] Office Visit from 12/12/2021 in [facility] Office Visit from 12/11/2021 in [facility] Office Visit from 08/26/2021 in [facility] Office Visit from 07/15/2021 in [facility] Office Visit from 07/02/2021 in [place]  PHQ-2 Total Score 0 0 2 3 0  PHQ-9 Total Score -- -- 6 9 --      [place] Office Visit from 12/12/2021 in [facility] Office Visit from 12/11/2021 in [facility] ED from 09/26/2021 in [facility]  C-SSRS RISK CATEGORY Low Risk No Risk No Risk        Assessment and Plan:   Darica Goren is a 46 year old female  with a past psychiatric history significant for generalized anxiety disorder with panic attacks and bipolar 2 disorder who presents to [facility] for follow-up and medication management.  Patient reports that she has been dealing with overwhelming anxiety due to stressors in her life.  Patient believes that her current clonazepam has not been helpful in the management of her symptoms.  Provider suggested patient be placed on Ativan 0.5 mg 2 times daily for a month in the management of her anxiety.  Provider recommended patient tapering off her dose of clonazepam by taking 0.5 mg 2 times daily as needed for 2 weeks, followed by 0.5 mg 1 times daily as needed for 1 week before discontinuing and starting Ativan.  Patient was agreeable to recommendations.  Patient's medications to be prescribed to pharmacy of choice.  Collaboration of Care: Collaboration of Care: Medication Management AEB provider managing patient's psychiatric medications, Primary Care Provider AEB patient being seen by primary care provider, and Psychiatrist AEB patient being followed by mental health provider  Patient/Guardian was advised Release of Information must be obtained prior to any record release in order to collaborate their care with an outside provider. Patient/Guardian was advised if they have not already done so to contact the registration department to sign all necessary forms in order for [place] to release information regarding their care.   Consent: Patient/Guardian gives verbal consent for treatment and assignment of benefits for services provided during this visit. Patient/Guardian expressed understanding and agreed to proceed.   1. Bipolar 2 disorder (HCC)  - ARIPiprazole (ABILIFY) 30 MG tablet; Take 1 tablet (30 mg total) by mouth daily.  Dispense: 30 tablet; Refill: 2 - lamoTRIgine (LAMICTAL) 100 MG tablet; Take 1 tablet (100 mg total) by mouth daily.  Dispense: 30 tablet; Refill:  2 - escitalopram (LEXAPRO) 20 MG tablet; Take 2 tablets (40 mg total) by mouth daily.  Dispense: 60 tablet; Refill: 2  2. Generalized anxiety disorder with panic attacks  - escitalopram (LEXAPRO) 20 MG tablet; Take 2 tablets (40 mg total) by mouth daily.  Dispense: 60 tablet; Refill: 2   Patient to follow up in 6 weeks Provider spent a total of 13 minutes with the patient/reviewing patient's chart  Malachy Mood, PA 12/12/2021, 7:30 PM

## 2021-12-15 ENCOUNTER — Other Ambulatory Visit: Payer: Self-pay

## 2021-12-16 ENCOUNTER — Ambulatory Visit: Payer: Self-pay | Admitting: Family Medicine

## 2021-12-22 ENCOUNTER — Other Ambulatory Visit: Payer: Self-pay | Admitting: Family Medicine

## 2021-12-22 ENCOUNTER — Other Ambulatory Visit: Payer: Self-pay

## 2021-12-23 ENCOUNTER — Other Ambulatory Visit: Payer: Self-pay

## 2021-12-23 MED ORDER — OZEMPIC (1 MG/DOSE) 4 MG/3ML ~~LOC~~ SOPN
1.0000 mg | PEN_INJECTOR | SUBCUTANEOUS | 0 refills | Status: DC
  Filled 2021-12-23 – 2021-12-29 (×2): qty 3, 28d supply, fill #0

## 2021-12-23 NOTE — Telephone Encounter (Signed)
Requested medication (s) are due for refill today:   Yes  Requested medication (s) are on the active medication list:   Yes  Future visit scheduled:   Yes with Georgian Co on 01/01/2022.  There is a note no refill until seen.   She has canceled and been a No Show for her last 2 appts.   Last ordered: 11/05/2021 3 ml, 0 refills  Returned for provider to review    Requested Prescriptions  Pending Prescriptions Disp Refills   Semaglutide, 1 MG/DOSE, (OZEMPIC, 1 MG/DOSE,) 4 MG/3ML SOPN 3 mL 0    Sig: Inject 1 mg as directed once a week.     Endocrinology:  Diabetes - GLP-1 Receptor Agonists - semaglutide Failed - 12/22/2021  8:34 AM      Failed - HBA1C in normal range and within 180 days    Hemoglobin A1C  Date Value Ref Range Status  07/02/2021 8.4 (A) 4.0 - 5.6 % Final   HbA1c, POC (controlled diabetic range)  Date Value Ref Range Status  03/31/2021 9.2 (A) 0.0 - 7.0 % Final         Passed - Cr in normal range and within 360 days    Creatinine, Ser  Date Value Ref Range Status  09/26/2021 0.83 0.44 - 1.00 mg/dL Final         Passed - Valid encounter within last 6 months    Recent Outpatient Visits           5 months ago Type 2 diabetes mellitus with hyperglycemia, with long-term current use of insulin (HCC)   [facility] [place], [place], MD   6 months ago Diabetes mellitus without complication [facility])   [facility] Storm Frisk, MD   8 months ago Type 2 diabetes mellitus with other specified complication, with long-term current use of insulin [facility])   [facility] [place], Cornelius Moras, RPH-CPP   8 months ago Screening for viral disease   [facility] Hoy Register, MD       Future Appointments             In 1 week [place], Marzella Schlein, PA-C [facility]

## 2021-12-25 ENCOUNTER — Ambulatory Visit (HOSPITAL_COMMUNITY): Payer: No Payment, Other | Admitting: Licensed Clinical Social Worker

## 2021-12-29 ENCOUNTER — Other Ambulatory Visit: Payer: Self-pay | Admitting: Family Medicine

## 2021-12-29 DIAGNOSIS — R600 Localized edema: Secondary | ICD-10-CM

## 2021-12-30 ENCOUNTER — Ambulatory Visit (INDEPENDENT_AMBULATORY_CARE_PROVIDER_SITE_OTHER): Payer: No Payment, Other | Admitting: Licensed Clinical Social Worker

## 2021-12-30 ENCOUNTER — Other Ambulatory Visit: Payer: Self-pay

## 2021-12-30 DIAGNOSIS — F411 Generalized anxiety disorder: Secondary | ICD-10-CM

## 2021-12-30 DIAGNOSIS — F410 Panic disorder [episodic paroxysmal anxiety] without agoraphobia: Secondary | ICD-10-CM

## 2021-12-30 NOTE — Progress Notes (Signed)
   THERAPIST PROGRESS NOTE  Session Time: 45 minutes  Participation Level: Active  Behavioral Response: Casual, Neat, and Well GroomedAlertEuthymic  Type of Therapy: Individual Therapy  Treatment Goals addressed: Decrease anxiety symptoms of excessive worry and increase adaptive coping skills  ProgressTowards Goals: Progressing  Interventions: CBT  Summary: Danielle Barker is a 46 y.o. female who presents for individual therapy session. She reports that since last visit she has continued to travel back and forth to assist her brother and sister-in-law, who recently had a stroke. She reports that she had her psychiatric visit with the provider and she was supposed to be started on Ativan but has opted not to and to remain on Klonopin. She identifies her primary stressors as worrying about her sister-in-law and brother and their two young children, worrying about her finances and missing work, worrying about taking care of her mother. She reports that she is has recently returned from [place] visiting her brother and sister-in-law and she will go there one more time before she will remain home for a longer amount of time. She repots that work has been going "ok" and processed concerns with a co-worker and explored alternatives to coping and checking in with herself. She maintains medication compliance and reports a decrease in negative Bipolar symptoms.  [name] is assigned "Worry Time Log" to begin tracking anxiety and thinking of alternate calming thoughts, instead of focusing on the primary worry, she is receptive to this.She is also provided with "Self-Talk" worksheet to reframe negative self talk. .   Suicidal/Homicidal: Nowithout intent/plan  Therapist Response: Mental status evaluated. Assessed for issues of dangerousness and self-harm. Used CBT therapy techniques to challenge negative maladaptive thinking and behavioral processes.  Plan: Return again in 4 weeks.  Diagnosis: Generalized  Anxiety Disorder, Bipolar II Disorder  Collaboration of Care: Medication Management AEB follow-up medication management appointment currently scheduled.  Patient/Guardian was advised Release of Information must be obtained prior to any record release in order to collaborate their care with an outside provider. Patient/Guardian was advised if they have not already done so to contact the registration department to sign all necessary forms in order for [place] to release information regarding their care.   Consent: Patient/Guardian gives verbal consent for treatment and assignment of benefits for services provided during this visit. Patient/Guardian expressed understanding and agreed to proceed.   Cheri Fowler, [facility] 12/30/2021

## 2021-12-31 ENCOUNTER — Other Ambulatory Visit: Payer: Self-pay

## 2021-12-31 MED ORDER — HYDROCHLOROTHIAZIDE 12.5 MG PO TABS
12.5000 mg | ORAL_TABLET | Freq: Every day | ORAL | 0 refills | Status: DC
  Filled 2021-12-31: qty 7, 7d supply, fill #0

## 2022-01-01 ENCOUNTER — Other Ambulatory Visit (HOSPITAL_COMMUNITY)
Admission: RE | Admit: 2022-01-01 | Discharge: 2022-01-01 | Disposition: A | Discharge status: Home / Self Care | Payer: Self-pay | Source: Ambulatory Visit | Attending: Family Medicine | Admitting: Family Medicine

## 2022-01-01 ENCOUNTER — Ambulatory Visit: Payer: Self-pay | Attending: Family Medicine | Admitting: Physician Assistant

## 2022-01-01 ENCOUNTER — Ambulatory Visit: Payer: Self-pay | Admitting: Physician Assistant

## 2022-01-01 ENCOUNTER — Other Ambulatory Visit: Payer: Self-pay

## 2022-01-01 ENCOUNTER — Encounter: Payer: Self-pay | Admitting: Physician Assistant

## 2022-01-01 VITALS — BP 110/75 | HR 92 | Ht 65.5 in | Wt 206.6 lb

## 2022-01-01 DIAGNOSIS — F410 Panic disorder [episodic paroxysmal anxiety] without agoraphobia: Secondary | ICD-10-CM

## 2022-01-01 DIAGNOSIS — Z113 Encounter for screening for infections with a predominantly sexual mode of transmission: Secondary | ICD-10-CM | POA: Insufficient documentation

## 2022-01-01 DIAGNOSIS — R600 Localized edema: Secondary | ICD-10-CM

## 2022-01-01 DIAGNOSIS — B009 Herpesviral infection, unspecified: Secondary | ICD-10-CM

## 2022-01-01 DIAGNOSIS — E1169 Type 2 diabetes mellitus with other specified complication: Secondary | ICD-10-CM

## 2022-01-01 DIAGNOSIS — N898 Other specified noninflammatory disorders of vagina: Secondary | ICD-10-CM | POA: Insufficient documentation

## 2022-01-01 DIAGNOSIS — E1165 Type 2 diabetes mellitus with hyperglycemia: Secondary | ICD-10-CM

## 2022-01-01 DIAGNOSIS — F411 Generalized anxiety disorder: Secondary | ICD-10-CM

## 2022-01-01 DIAGNOSIS — Z794 Long term (current) use of insulin: Secondary | ICD-10-CM

## 2022-01-01 DIAGNOSIS — E1142 Type 2 diabetes mellitus with diabetic polyneuropathy: Secondary | ICD-10-CM

## 2022-01-01 LAB — POCT GLYCOSYLATED HEMOGLOBIN (HGB A1C): HbA1c, POC (controlled diabetic range): 7.5 % — AB (ref 0.0–7.0)

## 2022-01-01 LAB — GLUCOSE, POCT (MANUAL RESULT ENTRY): POC Glucose: 205 mg/dl — AB (ref 70–99)

## 2022-01-01 MED ORDER — ROSUVASTATIN CALCIUM 20 MG PO TABS
20.0000 mg | ORAL_TABLET | Freq: Every day | ORAL | 1 refills | Status: AC
  Filled 2022-01-01 – 2022-02-02 (×2): qty 90, 90d supply, fill #0
  Filled 2022-06-09 (×2): qty 90, 90d supply, fill #1

## 2022-01-01 MED ORDER — FENOFIBRATE 48 MG PO TABS
48.0000 mg | ORAL_TABLET | Freq: Every day | ORAL | 2 refills | Status: AC
  Filled 2022-01-01 – 2022-02-17 (×3): qty 90, 90d supply, fill #0
  Filled 2022-06-09: qty 90, 90d supply, fill #1

## 2022-01-01 MED ORDER — GABAPENTIN 300 MG PO CAPS
600.0000 mg | ORAL_CAPSULE | Freq: Three times a day (TID) | ORAL | 3 refills | Status: AC
  Filled 2022-01-01 – 2022-02-17 (×2): qty 180, 30d supply, fill #0
  Filled 2022-06-01 – 2022-06-09 (×3): qty 180, 30d supply, fill #1

## 2022-01-01 MED ORDER — NOVOLOG FLEXPEN 100 UNIT/ML ~~LOC~~ SOPN
0.0000 [IU] | PEN_INJECTOR | Freq: Three times a day (TID) | SUBCUTANEOUS | 11 refills | Status: AC
  Filled 2022-01-01: qty 15, 42d supply, fill #0
  Filled 2022-03-03: qty 30, 84d supply, fill #0
  Filled 2022-06-09: qty 60, 168d supply, fill #1
  Filled 2022-06-09: qty 30, 84d supply, fill #1

## 2022-01-01 MED ORDER — HYDROCHLOROTHIAZIDE 12.5 MG PO TABS
12.5000 mg | ORAL_TABLET | Freq: Every day | ORAL | 0 refills | Status: DC
  Filled 2022-01-01: qty 30, 30d supply, fill #0
  Filled 2022-02-02: qty 30, 30d supply, fill #1
  Filled 2022-03-23: qty 30, 30d supply, fill #2

## 2022-01-01 MED ORDER — SEMAGLUTIDE (2 MG/DOSE) 8 MG/3ML ~~LOC~~ SOPN
2.0000 mg | PEN_INJECTOR | SUBCUTANEOUS | 2 refills | Status: AC
  Filled 2022-01-01: qty 9, fill #0
  Filled 2022-01-05: qty 3, 28d supply, fill #0
  Filled 2022-02-02: qty 3, 28d supply, fill #1
  Filled 2022-02-17: qty 3, 28d supply, fill #2
  Filled 2022-04-17: qty 12, 112d supply, fill #3

## 2022-01-01 MED ORDER — INSULIN GLARGINE 100 UNIT/ML ~~LOC~~ SOLN
40.0000 [IU] | Freq: Two times a day (BID) | SUBCUTANEOUS | 3 refills | Status: DC
  Filled 2022-01-01 – 2022-02-02 (×2): qty 20, 25d supply, fill #0
  Filled 2022-02-17: qty 40, 50d supply, fill #1

## 2022-01-01 MED ORDER — "INSULIN SYRINGE-NEEDLE U-100 31G X 5/16"" 1 ML MISC"
3 refills | Status: AC
  Filled 2022-01-01 – 2022-04-01 (×3): qty 100, 50d supply, fill #0
  Filled 2022-07-21: qty 100, 50d supply, fill #1

## 2022-01-01 MED ORDER — METFORMIN HCL ER 500 MG PO TB24
ORAL_TABLET | ORAL | 1 refills | Status: AC
  Filled 2022-01-01: qty 60, 30d supply, fill #0
  Filled 2022-03-23: qty 60, 30d supply, fill #1
  Filled 2022-06-09 (×2): qty 60, 30d supply, fill #2
  Filled 2022-07-21 (×2): qty 60, 30d supply, fill #3

## 2022-01-01 MED ORDER — VALACYCLOVIR HCL 500 MG PO TABS
500.0000 mg | ORAL_TABLET | Freq: Every day | ORAL | 1 refills | Status: AC
  Filled 2022-01-01: qty 60, 30d supply, fill #0
  Filled 2022-06-23: qty 60, 30d supply, fill #1
  Filled 2022-08-17 (×2): qty 60, 30d supply, fill #2

## 2022-01-01 NOTE — Progress Notes (Signed)
Patient ID: Danielle Barker, female   DOB: 07-16-1975, 46 y.o.   MRN: [id number]   Danielle Barker, is a 46 y.o. female  BBC:[id number]  [id number]:[id number]  DOB - July 21, 1975  Chief Complaint  Patient presents with   Diabetes       Subjective:   Danielle Barker is a 46 y.o. female here today for several issues.   Wants to increase ozempic to 2 mg bc blood sugars not controlled and frustrated.  Compliant with all of her meds.  Blood sugars at home usu around 200s. None higher than 230.  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    Wants to do STD screening bc she found out her BF had been cheating on her.  +vaginal odor,  slight vaginal discharge.    Needs RF and bloodwork   No problems updated.  ALLERGIES: Allergies  Allergen Reactions   Compazine [Prochlorperazine]    Dilaudid [Hydromorphone]     intolerence   Reglan [Metoclopramide]    Zofran [Ondansetron]     PAST MEDICAL HISTORY: Past Medical History:  Diagnosis Date   Diabetes mellitus without complication (HCC)    High cholesterol    High triglycerides    HSV (herpes simplex virus) infection    Mood disorder ([place])     MEDICATIONS AT HOME: Prior to Admission medications   Medication Sig Start Date End Date Taking? Authorizing Provider  ARIPiprazole (ABILIFY) 30 MG tablet Take 1 tablet (30 mg total) by mouth daily. 12/12/21  Yes Nwoko, Uchenna E, PA  Blood Glucose Monitoring Suppl (TRUE METRIX METER) w/Device KIT use kit to check blood glucose 3 three times daily before meals. 03/31/21  Yes Charlott Rakes, MD  clonazePAM (KLONOPIN) 1 MG tablet Take 1 tablet (1 mg total) by mouth 3 (three) times daily as needed for anxiety. 10/21/21 10/21/22 Yes Eulis Canner E, NP  escitalopram (LEXAPRO) 20 MG tablet Take 2 tablets (40 mg total) by mouth daily. 12/12/21  Yes Nwoko, Uchenna E, PA  glucose blood (TRUE METRIX BLOOD GLUCOSE TEST) test strip Use 1 test strip to check blood glucose 3 times daily  03/31/21  Yes Newlin, Charlane Ferretti, MD  lamoTRIgine (LAMICTAL) 100 MG tablet Take 1 tablet (100 mg total) by mouth daily. 12/12/21  Yes Nwoko, Terese Door, PA  Semaglutide, 2 MG/DOSE, 8 MG/3ML SOPN Inject 2 mg as directed once a week. 01/01/22  Yes Argentina Donovan, PA-C  TRUEplus Lancets 28G MISC use 1 lancet to check blood glucose 3 times daily before meals. 03/31/21  Yes Charlott Rakes, MD  valACYclovir (VALTREX) 500 MG tablet Take 1 tablet (500 mg total) by mouth daily. For prophylaxis.  Bid X 5 days prn outbreak 01/01/22  Yes Ronelle Michie M, PA-C  dicyclomine (BENTYL) 20 MG tablet Take 1 tablet (20 mg total) by mouth 2 (two) times daily. Patient not taking: Reported on 01/01/2022 02/28/21   Hazel Sams, PA-C  fenofibrate (TRICOR) 48 MG tablet Take 1 tablet (48 mg total) by mouth daily. 01/01/22 01/31/22  Argentina Donovan, PA-C  gabapentin (NEURONTIN) 300 MG capsule Take 2 capsules (600 mg total) by mouth 3 (three) times daily. 01/01/22 01/31/22  Argentina Donovan, PA-C  hydrochlorothiazide (HYDRODIURIL) 12.5 MG tablet Take 1 tablet (12.5 mg total) by mouth daily (Must have office visit for refills) 01/01/22   Argentina Donovan, PA-C  HYDROcodone-acetaminophen (NORCO) 5-325 MG tablet Take 1 tablet by mouth every 6 (six) hours as needed for moderate  pain. Patient not taking: Reported on 01/01/2022 08/19/21   Horton, Drue Dun M, DO  insulin aspart (NOVOLOG FLEXPEN) 100 UNIT/ML FlexPen Inject 0-12 Units into the skin 3 (three) times daily with meals. As per sliding scale 01/01/22   Argentina Donovan, PA-C  insulin glargine (LANTUS) 100 UNIT/ML injection Inject 0.4 mLs (40 Units total) into the skin 2 (two) times daily. 01/01/22   Argentina Donovan, PA-C  Insulin Syringe-Needle U-100 (TRUEPLUS INSULIN SYRINGE) 31G X 5/16" 1 ML MISC Use to inject Lantus twice a day. 01/01/22   Argentina Donovan, PA-C  metFORMIN (GLUCOPHAGE-XR) 500 MG 24 hr tablet Take one daily with food for one week then two daily with food 01/01/22   Argentina Donovan, PA-C  naproxen (NAPROSYN) 500 MG tablet Take 1 tablet (500 mg total) by mouth 2 (two) times daily. Patient not taking: Reported on 01/01/2022 09/18/20   Maudie Flakes, MD  rosuvastatin (CRESTOR) 20 MG tablet Take 1 tablet (20 mg total) by mouth daily for 30 doses. 01/01/22 01/31/22  Argentina Donovan, PA-C  citalopram (CELEXA) 40 MG tablet Take 40 mg by mouth daily.  11/25/20  [provider]    ROS: Neg HEENT Neg resp Neg cardiac Neg GI Neg MS Neg psych Neg neuro  Objective:   Vitals:   01/01/22 0858  BP: 110/75  Pulse: 92  SpO2: 97%  Weight: 206 lb 9.6 oz (93.7 kg)  Height: 5' 5.5" (1.664 m)   Exam General appearance : Awake, alert, not in any distress. Speech Clear. Not toxic looking HEENT: Atraumatic and Normocephalic Neck: Supple, no JVD. No cervical lymphadenopathy.  Chest: Good air entry bilaterally, CTAB.  No rales/rhonchi/wheezing CVS: S1 S2 regular, no murmurs.  Extremities: B/L Lower Ext shows no edema, both legs are warm to touch Neurology: Awake alert, and oriented X 3, CN II-XII intact, Non focal Skin: No Rash  Data Review Lab Results  Component Value Date   HGBA1C 7.5 (A) 01/01/2022   HGBA1C 8.4 (A) 07/02/2021   HGBA1C 9.2 (A) 03/31/2021    Assessment & Plan   1. Type 2 diabetes mellitus with hyperglycemia, with long-term current use of insulin (HCC) Not at goal but improved.  Will increase ozempic to $RemoveBe'2mg'oQrPWYsuP$  with close follow up.  Check blood sugars bid and see [name] in 3-4 weeks - Glucose (CBG) - HgB A1c - Comprehensive metabolic panel - Lipid panel - CBC with Differential/Platelet - Semaglutide, 2 MG/DOSE, 8 MG/3ML SOPN; Inject 2 mg as directed once a week.  Dispense: 9 mL; Refill: 2 - Insulin Syringe-Needle U-100 (TRUEPLUS INSULIN SYRINGE) 31G X 5/16" 1 ML MISC; Use to inject Lantus twice a day.  Dispense: 100 each; Refill: 3 - fenofibrate (TRICOR) 48 MG tablet; Take 1 tablet (48 mg total) by mouth daily.  Dispense: 90 tablet; Refill:  2 - insulin glargine (LANTUS) 100 UNIT/ML injection; Inject 0.4 mLs (40 Units total) into the skin 2 (two) times daily.  Dispense: 20 mL; Refill: 3 - metFORMIN (GLUCOPHAGE-XR) 500 MG 24 hr tablet; Take one daily with food for one week then two daily with food  Dispense: 180 tablet; Refill: 1 - insulin aspart (NOVOLOG FLEXPEN) 100 UNIT/ML FlexPen; Inject 0-12 Units into the skin 3 (three) times daily with meals. As per sliding scale  Dispense: 15 mL; Refill: 11  2. Generalized anxiety disorder with panic attacks - gabapentin (NEURONTIN) 300 MG capsule; Take 2 capsules (600 mg total) by mouth 3 (three) times daily.  Dispense: 180 capsule; Refill:  3  3. Diabetic polyneuropathy associated with type 2 diabetes mellitus (HCC) - gabapentin (NEURONTIN) 300 MG capsule; Take 2 capsules (600 mg total) by mouth 3 (three) times daily.  Dispense: 180 capsule; Refill: 3  4. Pedal edema - Comprehensive metabolic panel - hydrochlorothiazide (HYDRODIURIL) 12.5 MG tablet; Take 1 tablet (12.5 mg total) by mouth daily (Must have office visit for refills)  Dispense: 90 tablet; Refill: 0  5. Type 2 diabetes mellitus with other specified complication, with long-term current use of insulin (HCC) - insulin glargine (LANTUS) 100 UNIT/ML injection; Inject 0.4 mLs (40 Units total) into the skin 2 (two) times daily.  Dispense: 20 mL; Refill: 3 - insulin aspart (NOVOLOG FLEXPEN) 100 UNIT/ML FlexPen; Inject 0-12 Units into the skin 3 (three) times daily with meals. As per sliding scale  Dispense: 15 mL; Refill: 11  6. Screen for STD (sexually transmitted disease) - HepB+HepC+HIV Panel - RPR w/reflex to TrepSure - Cervicovaginal ancillary only  7. Vaginal odor - Cervicovaginal ancillary only  8. HSV-2 infection - valACYclovir (VALTREX) 500 MG tablet; Take 1 tablet (500 mg total) by mouth daily. For prophylaxis.  Bid X 5 days prn outbreak  Dispense: 120 tablet; Refill: 1    Return for [facility] in 1 month for DM; 3 months  with PCP for chronic conditions.  The patient was given clear instructions to go to ER or return to medical center if symptoms don't improve, worsen or new problems develop. The patient verbalized understanding. The patient was told to call to get lab results if they haven't heard anything in the next week.      Freeman Caldron, PA-C [facility], [place]   01/01/2022, 9:20 AM

## 2022-01-01 NOTE — Patient Instructions (Signed)
I have increased your ozempic to 2mg  weekly.  Check your blood sugars fasting and at bedtime.  Bring these numbers in to follow up in 1 month

## 2022-01-02 ENCOUNTER — Other Ambulatory Visit: Payer: Self-pay | Admitting: Physician Assistant

## 2022-01-02 ENCOUNTER — Telehealth: Payer: Self-pay

## 2022-01-02 ENCOUNTER — Other Ambulatory Visit: Payer: Self-pay

## 2022-01-02 LAB — CERVICOVAGINAL ANCILLARY ONLY
Bacterial Vaginitis (gardnerella): POSITIVE — AB
Candida Glabrata: NEGATIVE
Candida Vaginitis: NEGATIVE
Chlamydia: NEGATIVE
Comment: NEGATIVE
Comment: NEGATIVE
Comment: NEGATIVE
Comment: NEGATIVE
Comment: NEGATIVE
Comment: NORMAL
Neisseria Gonorrhea: NEGATIVE
Trichomonas: NEGATIVE

## 2022-01-02 MED ORDER — METRONIDAZOLE 500 MG PO TABS
500.0000 mg | ORAL_TABLET | Freq: Two times a day (BID) | ORAL | 0 refills | Status: DC
  Filled 2022-01-02: qty 14, 7d supply, fill #0

## 2022-01-02 NOTE — Telephone Encounter (Signed)
Pt given lab results per notes of Angela, PA on 01/02/22. Pt verbalized understanding.  

## 2022-01-05 ENCOUNTER — Other Ambulatory Visit: Payer: Self-pay

## 2022-01-05 ENCOUNTER — Telehealth: Payer: Self-pay | Admitting: Emergency Medicine

## 2022-01-05 NOTE — Telephone Encounter (Signed)
Pt has viewed results via her mychart.

## 2022-01-05 NOTE — Telephone Encounter (Signed)
Copied from CRM [id number]. Topic: General - Inquiry >> Jan 05, 2022  7:48 AM De Blanch wrote: Reason for CRM: Pt is requesting a call back to follow up on labs Hep C, HIV and syphilis.  Pt requesting a call back.

## 2022-01-06 ENCOUNTER — Encounter: Payer: Self-pay | Admitting: Physician Assistant

## 2022-01-06 LAB — HEPB+HEPC+HIV PANEL
HIV Screen 4th Generation wRfx: NONREACTIVE
Hep B C IgM: NEGATIVE
Hep B Core Total Ab: NEGATIVE
Hep B E Ab: NEGATIVE
Hep B E Ag: NEGATIVE
Hep B Surface Ab, Qual: REACTIVE
Hep C Virus Ab: NONREACTIVE
Hepatitis B Surface Ag: NEGATIVE

## 2022-01-06 LAB — CBC WITH DIFFERENTIAL/PLATELET
Basophils Absolute: 0.1 10*3/uL (ref 0.0–0.2)
Basos: 1 %
EOS (ABSOLUTE): 0.1 10*3/uL (ref 0.0–0.4)
Eos: 1 %
Hematocrit: 38.8 % (ref 34.0–46.6)
Hemoglobin: 13.1 g/dL (ref 11.1–15.9)
Immature Grans (Abs): 0 10*3/uL (ref 0.0–0.1)
Immature Granulocytes: 0 %
Lymphocytes Absolute: 1.7 10*3/uL (ref 0.7–3.1)
Lymphs: 25 %
MCH: 31 pg (ref 26.6–33.0)
MCHC: 33.8 g/dL (ref 31.5–35.7)
MCV: 92 fL (ref 79–97)
Monocytes Absolute: 0.4 10*3/uL (ref 0.1–0.9)
Monocytes: 6 %
Neutrophils Absolute: 4.6 10*3/uL (ref 1.4–7.0)
Neutrophils: 67 %
Platelets: 390 10*3/uL (ref 150–450)
RBC: 4.22 x10E6/uL (ref 3.77–5.28)
RDW: 11.9 % (ref 11.7–15.4)
WBC: 6.8 10*3/uL (ref 3.4–10.8)

## 2022-01-06 LAB — COMPREHENSIVE METABOLIC PANEL
ALT: 22 IU/L (ref 0–32)
AST: 15 IU/L (ref 0–40)
Albumin/Globulin Ratio: 1.7 (ref 1.2–2.2)
Albumin: 4.4 g/dL (ref 3.9–4.9)
Alkaline Phosphatase: 93 IU/L (ref 44–121)
BUN/Creatinine Ratio: 18 (ref 9–23)
BUN: 15 mg/dL (ref 6–24)
Bilirubin Total: 0.2 mg/dL (ref 0.0–1.2)
CO2: 22 mmol/L (ref 20–29)
Calcium: 9.8 mg/dL (ref 8.7–10.2)
Chloride: 100 mmol/L (ref 96–106)
Creatinine, Ser: 0.84 mg/dL (ref 0.57–1.00)
Globulin, Total: 2.6 g/dL (ref 1.5–4.5)
Glucose: 172 mg/dL — ABNORMAL HIGH (ref 70–99)
Potassium: 4.8 mmol/L (ref 3.5–5.2)
Sodium: 139 mmol/L (ref 134–144)
Total Protein: 7 g/dL (ref 6.0–8.5)
eGFR: 87 mL/min/{1.73_m2} (ref >59)

## 2022-01-06 LAB — LIPID PANEL
Chol/HDL Ratio: 3.8 ratio (ref 0.0–4.4)
Cholesterol, Total: 147 mg/dL (ref 100–199)
HDL: 39 mg/dL — ABNORMAL LOW (ref >39)
LDL Chol Calc (NIH): 83 mg/dL (ref 0–99)
Triglycerides: 143 mg/dL (ref 0–149)
VLDL Cholesterol Cal: 25 mg/dL (ref 5–40)

## 2022-01-06 LAB — T PALLIDUM ANTIBODY, EIA: T pallidum Antibody, EIA: NEGATIVE

## 2022-01-06 LAB — RPR W/REFLEX TO TREPSURE: RPR: NONREACTIVE

## 2022-01-06 NOTE — Telephone Encounter (Signed)
Pt called back.  She is saying the results is not on her my-chart.  She can't see it.  She is asking a nurse call her back.  CB@  [phone]

## 2022-01-06 NOTE — Telephone Encounter (Signed)
Call placed to patient and VM is not set up to leave a message. Message has been sent to patient via her mychart.

## 2022-01-07 ENCOUNTER — Other Ambulatory Visit: Payer: Self-pay

## 2022-01-09 ENCOUNTER — Other Ambulatory Visit: Payer: Self-pay

## 2022-01-09 ENCOUNTER — Encounter (HOSPITAL_BASED_OUTPATIENT_CLINIC_OR_DEPARTMENT_OTHER): Payer: Self-pay | Admitting: Emergency Medicine

## 2022-01-09 ENCOUNTER — Emergency Department (HOSPITAL_BASED_OUTPATIENT_CLINIC_OR_DEPARTMENT_OTHER)
Admission: EM | Admit: 2022-01-09 | Discharge: 2022-01-09 | Disposition: A | Discharge status: Home / Self Care | Payer: Self-pay | Attending: Emergency Medicine | Admitting: Emergency Medicine

## 2022-01-09 ENCOUNTER — Emergency Department (HOSPITAL_BASED_OUTPATIENT_CLINIC_OR_DEPARTMENT_OTHER): Payer: Self-pay | Admitting: Radiology

## 2022-01-09 DIAGNOSIS — E119 Type 2 diabetes mellitus without complications: Secondary | ICD-10-CM | POA: Insufficient documentation

## 2022-01-09 DIAGNOSIS — K5900 Constipation, unspecified: Secondary | ICD-10-CM | POA: Insufficient documentation

## 2022-01-09 DIAGNOSIS — Z794 Long term (current) use of insulin: Secondary | ICD-10-CM | POA: Insufficient documentation

## 2022-01-09 DIAGNOSIS — R110 Nausea: Secondary | ICD-10-CM | POA: Insufficient documentation

## 2022-01-09 DIAGNOSIS — R079 Chest pain, unspecified: Secondary | ICD-10-CM | POA: Insufficient documentation

## 2022-01-09 DIAGNOSIS — R1011 Right upper quadrant pain: Secondary | ICD-10-CM | POA: Insufficient documentation

## 2022-01-09 DIAGNOSIS — Z7984 Long term (current) use of oral hypoglycemic drugs: Secondary | ICD-10-CM | POA: Insufficient documentation

## 2022-01-09 LAB — CBC
HCT: 37.6 % (ref 36.0–46.0)
Hemoglobin: 13 g/dL (ref 12.0–15.0)
MCH: 31.3 pg (ref 26.0–34.0)
MCHC: 34.6 g/dL (ref 30.0–36.0)
MCV: 90.4 fL (ref 80.0–100.0)
Platelets: 385 10*3/uL (ref 150–400)
RBC: 4.16 MIL/uL (ref 3.87–5.11)
RDW: 12.1 % (ref 11.5–15.5)
WBC: 8.7 10*3/uL (ref 4.0–10.5)
nRBC: 0 % (ref 0.0–0.2)

## 2022-01-09 LAB — BASIC METABOLIC PANEL
Anion gap: 12 (ref 5–15)
BUN: 17 mg/dL (ref 6–20)
CO2: 26 mmol/L (ref 22–32)
Calcium: 9.8 mg/dL (ref 8.9–10.3)
Chloride: 99 mmol/L (ref 98–111)
Creatinine, Ser: 0.88 mg/dL (ref 0.44–1.00)
GFR, Estimated: >60 mL/min (ref >60)
Glucose, Bld: 180 mg/dL — ABNORMAL HIGH (ref 70–99)
Potassium: 3.7 mmol/L (ref 3.5–5.1)
Sodium: 137 mmol/L (ref 135–145)

## 2022-01-09 LAB — LIPASE, BLOOD: Lipase: 36 U/L (ref 11–51)

## 2022-01-09 LAB — TROPONIN I (HIGH SENSITIVITY)
Troponin I (High Sensitivity): <2 ng/L (ref <18)
Troponin I (High Sensitivity): <2 ng/L (ref <18)

## 2022-01-09 NOTE — ED Provider Notes (Signed)
[facility] [facility] EMERGENCY DEPT Provider Note   CSN: [id number] Arrival date & time: 01/09/22  2003     History {Add pertinent medical, surgical, social history, OB history to HPI:1} Chief Complaint  Patient presents with   Chest Pain    Danielle Barker is a 46 y.o. female.   Chest Pain      Home Medications Prior to Admission medications   Medication Sig Start Date End Date Taking? Authorizing Provider  ARIPiprazole (ABILIFY) 30 MG tablet Take 1 tablet (30 mg total) by mouth daily. 12/12/21   Nwoko, Terese Door, PA  Blood Glucose Monitoring Suppl (TRUE METRIX METER) w/Device KIT use kit to check blood glucose 3 three times daily before meals. 03/31/21   Charlott Rakes, MD  clonazePAM (KLONOPIN) 1 MG tablet Take 1 tablet (1 mg total) by mouth 3 (three) times daily as needed for anxiety. 10/21/21 10/21/22  Salley Slaughter, NP  dicyclomine (BENTYL) 20 MG tablet Take 1 tablet (20 mg total) by mouth 2 (two) times daily. Patient not taking: Reported on 01/01/2022 02/28/21   Hazel Sams, PA-C  escitalopram (LEXAPRO) 20 MG tablet Take 2 tablets (40 mg total) by mouth daily. 12/12/21   Nwoko, Terese Door, PA  fenofibrate (TRICOR) 48 MG tablet Take 1 tablet (48 mg total) by mouth daily. 01/01/22 01/31/22  Argentina Donovan, PA-C  gabapentin (NEURONTIN) 300 MG capsule Take 2 capsules (600 mg total) by mouth 3 (three) times daily. 01/01/22 01/31/22  Argentina Donovan, PA-C  glucose blood (TRUE METRIX BLOOD GLUCOSE TEST) test strip Use 1 test strip to check blood glucose 3 times daily 03/31/21   Charlott Rakes, MD  hydrochlorothiazide (HYDRODIURIL) 12.5 MG tablet Take 1 tablet (12.5 mg total) by mouth daily (Must have office visit for refills) 01/01/22   Argentina Donovan, PA-C  HYDROcodone-acetaminophen (NORCO) 5-325 MG tablet Take 1 tablet by mouth every 6 (six) hours as needed for moderate pain. Patient not taking: Reported on 01/01/2022 08/19/21   Horton, Drue Dun M, DO  insulin aspart  (NOVOLOG FLEXPEN) 100 UNIT/ML FlexPen Inject 0-12 Units into the skin 3 (three) times daily with meals. As per sliding scale 01/01/22   Argentina Donovan, PA-C  insulin glargine (LANTUS) 100 UNIT/ML injection Inject 0.4 mLs (40 Units total) into the skin 2 (two) times daily. 01/01/22   Argentina Donovan, PA-C  Insulin Syringe-Needle U-100 (TRUEPLUS INSULIN SYRINGE) 31G X 5/16" 1 ML MISC Use to inject Lantus twice a day. 01/01/22   Argentina Donovan, PA-C  lamoTRIgine (LAMICTAL) 100 MG tablet Take 1 tablet (100 mg total) by mouth daily. 12/12/21   Nwoko, Terese Door, PA  metFORMIN (GLUCOPHAGE-XR) 500 MG 24 hr tablet Take one daily with food for one week then two daily with food 01/01/22   Freeman Caldron M, PA-C  metroNIDAZOLE (FLAGYL) 500 MG tablet Take 1 tablet (500 mg total) by mouth 2 (two) times daily. 01/02/22   Argentina Donovan, PA-C  naproxen (NAPROSYN) 500 MG tablet Take 1 tablet (500 mg total) by mouth 2 (two) times daily. Patient not taking: Reported on 01/01/2022 09/18/20   Maudie Flakes, MD  rosuvastatin (CRESTOR) 20 MG tablet Take 1 tablet (20 mg total) by mouth daily for 30 doses. 01/01/22 01/31/22  Argentina Donovan, PA-C  Semaglutide, 2 MG/DOSE, 8 MG/3ML SOPN Inject 2 mg as directed once a week. 01/01/22   Argentina Donovan, PA-C  TRUEplus Lancets 28G MISC use 1 lancet to check blood glucose 3 times daily  before meals. 03/31/21   Charlott Rakes, MD  valACYclovir (VALTREX) 500 MG tablet Take 1 tablet (500 mg total) by mouth daily for prophylaxis. If out break occurs take 1 tablet twice a day x 5 days as needed for outbreak 01/01/22   Argentina Donovan, PA-C  citalopram (CELEXA) 40 MG tablet Take 40 mg by mouth daily.  11/25/20  [provider]      Allergies    Compazine [prochlorperazine], Dilaudid [hydromorphone], Reglan [metoclopramide], and Zofran [ondansetron]    Review of Systems   Review of Systems  Cardiovascular:  Positive for chest pain.    Physical Exam Updated Vital Signs BP  106/77   Pulse 80   Temp 98 F (36.7 C)   Resp 13   Ht 1.651 m ($Remove'5\' 5"'dYepYtE$ )   Wt 92.5 kg   LMP 12/01/2021   SpO2 98%   BMI 33.95 kg/m  Physical Exam  ED Results / Procedures / Treatments   Labs (all labs ordered are listed, but only abnormal results are displayed) Labs Reviewed  BASIC METABOLIC PANEL - Abnormal; Notable for the following components:      Result Value   Glucose, Bld 180 (*)    All other components within normal limits  CBC  LIPASE, BLOOD  TROPONIN I (HIGH SENSITIVITY)  TROPONIN I (HIGH SENSITIVITY)    EKG EKG Interpretation  Date/Time:  Friday January 09 2022 20:31:30 EDT Ventricular Rate:  90 PR Interval:  158 QRS Duration: 92 QT Interval:  378 QTC Calculation: 462 R Axis:   85 Text Interpretation: Normal sinus rhythm Normal ECG When compared with ECG of 26-Sep-2021 17:35, No significant change since last tracing Confirmed by Ripley Fraise [phone]) on 01/09/2022 11:17:53 PM  Radiology DG Chest 2 View  Result Date: 01/09/2022 CLINICAL DATA:  Chest pain. Right-sided rib pain that radiates to right side of chest with nausea for 3 days. EXAM: CHEST - 2 VIEW COMPARISON:  09/26/2021 FINDINGS: Heart size and mediastinal contours appear normal. There is no pleural effusion or edema identified. There is no airspace opacities. The visualized osseous structures appear intact. IMPRESSION: No active cardiopulmonary abnormalities. Electronically Signed   By: Kerby Moors M.D.   On: 01/09/2022 20:55    Procedures Procedures  {Document cardiac monitor, telemetry assessment procedure when appropriate:1}  Medications Ordered in ED Medications - No data to display  ED Course/ Medical Decision Making/ A&P                           Medical Decision Making Amount and/or Complexity of Data Reviewed Labs: ordered. Radiology: ordered.   ***  {Document critical care time when appropriate:1} {Document review of labs and clinical decision tools ie heart score,  Chads2Vasc2 etc:1}  {Document your independent review of radiology images, and any outside records:1} {Document your discussion with family members, caretakers, and with consultants:1} {Document social determinants of health affecting pt's care:1} {Document your decision making why or why not admission, treatments were needed:1} Final Clinical Impression(s) / ED Diagnoses Final diagnoses:  None    Rx / DC Orders ED Discharge Orders     None

## 2022-01-09 NOTE — ED Triage Notes (Signed)
Pt c/o right sided rib pain that radiates into the right side of her chest with nausea x 3 days.

## 2022-01-09 NOTE — Discharge Instructions (Signed)
°  SEEK IMMEDIATE MEDICAL ATTENTION IF: °The pain does not go away or becomes severe, particularly over the next 8-12 hours.  °A temperature above 100.4F develops.  °Repeated vomiting occurs (multiple episodes).  ° °Blood is being passed in stools or vomit (bright red or black tarry stools).  °Return also if you develop chest pain, difficulty breathing, dizziness or fainting, or become confused, poorly responsive, or inconsolable. ° °

## 2022-01-12 ENCOUNTER — Other Ambulatory Visit: Payer: Self-pay

## 2022-01-13 ENCOUNTER — Other Ambulatory Visit: Payer: Self-pay

## 2022-01-19 ENCOUNTER — Other Ambulatory Visit: Payer: Self-pay

## 2022-01-20 ENCOUNTER — Other Ambulatory Visit: Payer: Self-pay

## 2022-01-20 ENCOUNTER — Encounter (HOSPITAL_COMMUNITY): Payer: No Payment, Other | Admitting: Physician Assistant

## 2022-01-26 ENCOUNTER — Other Ambulatory Visit: Payer: Self-pay

## 2022-01-27 ENCOUNTER — Ambulatory Visit (HOSPITAL_COMMUNITY): Payer: No Payment, Other | Admitting: Licensed Clinical Social Worker

## 2022-02-02 ENCOUNTER — Other Ambulatory Visit: Payer: Self-pay

## 2022-02-03 ENCOUNTER — Other Ambulatory Visit: Payer: Self-pay

## 2022-02-04 ENCOUNTER — Other Ambulatory Visit: Payer: Self-pay

## 2022-02-04 ENCOUNTER — Emergency Department (HOSPITAL_BASED_OUTPATIENT_CLINIC_OR_DEPARTMENT_OTHER)
Admission: EM | Admit: 2022-02-04 | Discharge: 2022-02-04 | Disposition: A | Discharge status: Home / Self Care | Payer: Self-pay | Attending: Emergency Medicine | Admitting: Emergency Medicine

## 2022-02-04 ENCOUNTER — Encounter (HOSPITAL_BASED_OUTPATIENT_CLINIC_OR_DEPARTMENT_OTHER): Payer: Self-pay | Admitting: Emergency Medicine

## 2022-02-04 ENCOUNTER — Other Ambulatory Visit (HOSPITAL_BASED_OUTPATIENT_CLINIC_OR_DEPARTMENT_OTHER): Payer: Self-pay

## 2022-02-04 DIAGNOSIS — H53149 Visual discomfort, unspecified: Secondary | ICD-10-CM | POA: Insufficient documentation

## 2022-02-04 DIAGNOSIS — Z79899 Other long term (current) drug therapy: Secondary | ICD-10-CM | POA: Insufficient documentation

## 2022-02-04 DIAGNOSIS — R519 Headache, unspecified: Secondary | ICD-10-CM | POA: Insufficient documentation

## 2022-02-04 DIAGNOSIS — R42 Dizziness and giddiness: Secondary | ICD-10-CM | POA: Insufficient documentation

## 2022-02-04 DIAGNOSIS — Z794 Long term (current) use of insulin: Secondary | ICD-10-CM | POA: Insufficient documentation

## 2022-02-04 LAB — COMPREHENSIVE METABOLIC PANEL
ALT: 22 U/L (ref 0–44)
AST: 15 U/L (ref 15–41)
Albumin: 4.1 g/dL (ref 3.5–5.0)
Alkaline Phosphatase: 77 U/L (ref 38–126)
Anion gap: 7 (ref 5–15)
BUN: 12 mg/dL (ref 6–20)
CO2: 29 mmol/L (ref 22–32)
Calcium: 9.3 mg/dL (ref 8.9–10.3)
Chloride: 100 mmol/L (ref 98–111)
Creatinine, Ser: 0.87 mg/dL (ref 0.44–1.00)
GFR, Estimated: >60 mL/min (ref >60)
Glucose, Bld: 187 mg/dL — ABNORMAL HIGH (ref 70–99)
Potassium: 3.9 mmol/L (ref 3.5–5.1)
Sodium: 136 mmol/L (ref 135–145)
Total Bilirubin: 0.4 mg/dL (ref 0.3–1.2)
Total Protein: 7 g/dL (ref 6.5–8.1)

## 2022-02-04 LAB — CBC WITH DIFFERENTIAL/PLATELET
Abs Immature Granulocytes: 0.01 10*3/uL (ref 0.00–0.07)
Basophils Absolute: 0 10*3/uL (ref 0.0–0.1)
Basophils Relative: 1 %
Eosinophils Absolute: 0.1 10*3/uL (ref 0.0–0.5)
Eosinophils Relative: 2 %
HCT: 34.6 % — ABNORMAL LOW (ref 36.0–46.0)
Hemoglobin: 11.8 g/dL — ABNORMAL LOW (ref 12.0–15.0)
Immature Granulocytes: 0 %
Lymphocytes Relative: 27 %
Lymphs Abs: 1.8 10*3/uL (ref 0.7–4.0)
MCH: 31.6 pg (ref 26.0–34.0)
MCHC: 34.1 g/dL (ref 30.0–36.0)
MCV: 92.5 fL (ref 80.0–100.0)
Monocytes Absolute: 0.5 10*3/uL (ref 0.1–1.0)
Monocytes Relative: 7 %
Neutro Abs: 4.3 10*3/uL (ref 1.7–7.7)
Neutrophils Relative %: 63 %
Platelets: 350 10*3/uL (ref 150–400)
RBC: 3.74 MIL/uL — ABNORMAL LOW (ref 3.87–5.11)
RDW: 12.4 % (ref 11.5–15.5)
WBC: 6.7 10*3/uL (ref 4.0–10.5)
nRBC: 0 % (ref 0.0–0.2)

## 2022-02-04 LAB — CBG MONITORING, ED: Glucose-Capillary: 172 mg/dL — ABNORMAL HIGH (ref 70–99)

## 2022-02-04 MED ORDER — MAGNESIUM SULFATE 2 GM/50ML IV SOLN
2.0000 g | Freq: Once | INTRAVENOUS | Status: AC
  Administered 2022-02-04: 2 g via INTRAVENOUS
  Filled 2022-02-04: qty 50

## 2022-02-04 MED ORDER — MECLIZINE HCL 25 MG PO TABS
25.0000 mg | ORAL_TABLET | Freq: Three times a day (TID) | ORAL | 0 refills | Status: AC | PRN
  Filled 2022-02-04: qty 30, 10d supply, fill #0

## 2022-02-04 MED ORDER — SODIUM CHLORIDE 0.9 % IV BOLUS
1000.0000 mL | Freq: Once | INTRAVENOUS | Status: AC
  Administered 2022-02-04: 1000 mL via INTRAVENOUS

## 2022-02-04 MED ORDER — DEXAMETHASONE 4 MG PO TABS
10.0000 mg | ORAL_TABLET | Freq: Once | ORAL | Status: AC
  Administered 2022-02-04: 10 mg via ORAL
  Filled 2022-02-04: qty 3

## 2022-02-04 NOTE — ED Notes (Signed)
Dc instructions reviewed with patient. Patient voiced understanding. Dc with belongings.  °

## 2022-02-04 NOTE — ED Triage Notes (Signed)
Pt states also has a headache since Friday.

## 2022-02-04 NOTE — ED Triage Notes (Signed)
Pt states Friday night when walking to car she almost fell over due to dizziness, not feeling quite right . Since then has felt a heaviness all over, blurry vision, seeing stars intermittently, mispronouncing some words occasional.

## 2022-02-04 NOTE — ED Provider Notes (Signed)
[facility] DEPT Provider Note   CSN: [id number] Arrival date & time: 02/04/22  9702     History  Chief Complaint  Patient presents with   Dizziness    Danielle Barker is a 46 y.o. female.  46 yo F with a chief complaint of dizziness.  This been going on for about 4 days now.  She had 1 episode where she was very dizzy and felt unsteady on her feet for a couple seconds.  Since then seems to be coming and going.  She just does not feel well and has trouble describing it.  Denies cough congestion or fever denies decreased oral intake denies nausea or vomiting.  Denies one-sided numbness or weakness denies difficulty speech or swallowing.  She has developed headache and has had some photophobia.  She also has noticed some flashes at times.  Has never had a thing like this happen to her before.   Dizziness      Home Medications Prior to Admission medications   Medication Sig Start Date End Date Taking? Authorizing Provider  meclizine (ANTIVERT) 25 MG tablet Take 1 tablet (25 mg total) by mouth 3 (three) times daily as needed for dizziness. 02/04/22  Yes Deno Etienne, DO  ARIPiprazole (ABILIFY) 30 MG tablet Take 1 tablet (30 mg total) by mouth daily. 12/12/21   Nwoko, Terese Door, PA  Blood Glucose Monitoring Suppl (TRUE METRIX METER) w/Device KIT use kit to check blood glucose 3 three times daily before meals. 03/31/21   Charlott Rakes, MD  clonazePAM (KLONOPIN) 1 MG tablet Take 1 tablet (1 mg total) by mouth 3 (three) times daily as needed for anxiety. 10/21/21 10/21/22  Salley Slaughter, NP  dicyclomine (BENTYL) 20 MG tablet Take 1 tablet (20 mg total) by mouth 2 (two) times daily. Patient not taking: Reported on 01/01/2022 02/28/21   Hazel Sams, PA-C  escitalopram (LEXAPRO) 20 MG tablet Take 2 tablets (40 mg total) by mouth daily. 12/12/21   Nwoko, Terese Door, PA  fenofibrate (TRICOR) 48 MG tablet Take 1 tablet (48 mg total) by mouth daily. 01/01/22   Argentina Donovan, PA-C  gabapentin (NEURONTIN) 300 MG capsule Take 2 capsules (600 mg total) by mouth 3 (three) times daily. 01/01/22 01/31/22  Argentina Donovan, PA-C  glucose blood (TRUE METRIX BLOOD GLUCOSE TEST) test strip Use 1 test strip to check blood glucose 3 times daily 03/31/21   Charlott Rakes, MD  hydrochlorothiazide (HYDRODIURIL) 12.5 MG tablet Take 1 tablet (12.5 mg total) by mouth daily (Must have office visit for refills) 01/01/22   Argentina Donovan, PA-C  HYDROcodone-acetaminophen (NORCO) 5-325 MG tablet Take 1 tablet by mouth every 6 (six) hours as needed for moderate pain. Patient not taking: Reported on 01/01/2022 08/19/21   Horton, Drue Dun M, DO  insulin aspart (NOVOLOG FLEXPEN) 100 UNIT/ML FlexPen Inject 0-12 Units into the skin 3 (three) times daily with meals. As per sliding scale 01/01/22   Argentina Donovan, PA-C  insulin glargine (LANTUS) 100 UNIT/ML injection Inject 0.4 mLs (40 Units total) into the skin 2 (two) times daily. 01/01/22   Argentina Donovan, PA-C  Insulin Syringe-Needle U-100 (TRUEPLUS INSULIN SYRINGE) 31G X 5/16" 1 ML MISC Use to inject Lantus twice a day. 01/01/22   Argentina Donovan, PA-C  lamoTRIgine (LAMICTAL) 100 MG tablet Take 1 tablet (100 mg total) by mouth daily. 12/12/21   Nwoko, Terese Door, PA  metFORMIN (GLUCOPHAGE-XR) 500 MG 24 hr tablet Take one daily with  food for one week then two daily with food 01/01/22   Argentina Donovan, PA-C  metroNIDAZOLE (FLAGYL) 500 MG tablet Take 1 tablet (500 mg total) by mouth 2 (two) times daily. 01/02/22   Argentina Donovan, PA-C  naproxen (NAPROSYN) 500 MG tablet Take 1 tablet (500 mg total) by mouth 2 (two) times daily. Patient not taking: Reported on 01/01/2022 09/18/20   Maudie Flakes, MD  rosuvastatin (CRESTOR) 20 MG tablet Take 1 tablet (20 mg total) by mouth daily. 01/01/22   Argentina Donovan, PA-C  Semaglutide, 2 MG/DOSE, 8 MG/3ML SOPN Inject 2 mg as directed once a week. 01/01/22   Argentina Donovan, PA-C  TRUEplus Lancets 28G MISC  use 1 lancet to check blood glucose 3 times daily before meals. 03/31/21   Charlott Rakes, MD  valACYclovir (VALTREX) 500 MG tablet Take 1 tablet (500 mg total) by mouth daily for prophylaxis. If out break occurs take 1 tablet twice a day x 5 days as needed for outbreak 01/01/22   Argentina Donovan, PA-C  citalopram (CELEXA) 40 MG tablet Take 40 mg by mouth daily.  11/25/20  [provider]      Allergies    Compazine [prochlorperazine], Dilaudid [hydromorphone], Reglan [metoclopramide], and Zofran [ondansetron]    Review of Systems   Review of Systems  Neurological:  Positive for dizziness.    Physical Exam Updated Vital Signs BP 117/80   Pulse 78   Temp 98.1 F (36.7 C)   Resp 14   LMP 12/01/2021   SpO2 98%  Physical Exam Vitals and nursing note reviewed.  Constitutional:      General: She is not in acute distress.    Appearance: She is well-developed. She is not diaphoretic.  HENT:     Head: Normocephalic and atraumatic.  Eyes:     Pupils: Pupils are equal, round, and reactive to light.  Cardiovascular:     Rate and Rhythm: Normal rate and regular rhythm.     Heart sounds: No murmur heard.    No friction rub. No gallop.  Pulmonary:     Effort: Pulmonary effort is normal.     Breath sounds: No wheezing or rales.  Abdominal:     General: There is no distension.     Palpations: Abdomen is soft.     Tenderness: There is no abdominal tenderness.  Musculoskeletal:        General: No tenderness.     Cervical back: Normal range of motion and neck supple.  Skin:    General: Skin is warm and dry.  Neurological:     Mental Status: She is alert and oriented to person, place, and time.     Cranial Nerves: Cranial nerves 2-12 are intact.     Sensory: Sensation is intact.     Motor: Motor function is intact.     Coordination: Coordination is intact.     Comments: Left-sided fast going nystagmus, otherwise benign neurologic exam.  Ambulates without issue.  Psychiatric:         Behavior: Behavior normal.     ED Results / Procedures / Treatments   Labs (all labs ordered are listed, but only abnormal results are displayed) Labs Reviewed  CBC WITH DIFFERENTIAL/PLATELET - Abnormal; Notable for the following components:      Result Value   RBC 3.74 (*)    Hemoglobin 11.8 (*)    HCT 34.6 (*)    All other components within normal limits  COMPREHENSIVE METABOLIC PANEL -  Abnormal; Notable for the following components:   Glucose, Bld 187 (*)    All other components within normal limits  CBG MONITORING, ED - Abnormal; Notable for the following components:   Glucose-Capillary 172 (*)    All other components within normal limits  URINALYSIS, ROUTINE W REFLEX MICROSCOPIC  PREGNANCY, URINE    EKG None  Radiology No results found.  Procedures Procedures    Medications Ordered in ED Medications  dexamethasone (DECADRON) tablet 10 mg (has no administration in time range)  sodium chloride 0.9 % bolus 1,000 mL (1,000 mLs Intravenous New Bag/Given 02/04/22 1009)  magnesium sulfate IVPB 2 g 50 mL (2 g Intravenous New Bag/Given 02/04/22 1012)    ED Course/ Medical Decision Making/ A&P                           Medical Decision Making Amount and/or Complexity of Data Reviewed Labs: ordered. ECG/medicine tests: ordered.  Risk Prescription drug management.   46 yo F with a chief complaints of dizziness.  Sounds like a sensation of unsteadiness.  Unsure what seems to make it better or worse.  Had a couple episodes early in the onset where she felt very unsteady but only for a moment or 2 and then resolved.  She has a benign neurologic exam here.  Could be BPPV by history.  Stroke possible but less likely based on age and risk factors.  Complicated migraine also possibility.  Patient unfortunately has an intolerance to the dopaminergic medications used for headache cocktail.  We will try rapid infusion of magnesium.  Bolus of IV fluids.  Blood work  reassess.  Patient is feeling a bit better on reassessment.  We will give a dose of Decadron for possible labyrinthitis or for migraine recurrence prevention.  I discussed the risk and benefits of acute MRI imaging.  Patient currently declining.  Would like to follow-up as an outpatient.  11:04 AM:  I have discussed the diagnosis/risks/treatment options with the patient.  Evaluation and diagnostic testing in the emergency department does not suggest an emergent condition requiring admission or immediate intervention beyond what has been performed at this time.  They will follow up with PCP, neuro. We also discussed returning to the ED immediately if new or worsening sx occur. We discussed the sx which are most concerning (e.g., sudden worsening pain, fever, inability to tolerate by mouth, inability to walk, one-sided numbness or weakness difficulty speech or swallowing) that necessitate immediate return. Medications administered to the patient during their visit and any new prescriptions provided to the patient are listed below.  Medications given during this visit Medications  dexamethasone (DECADRON) tablet 10 mg (has no administration in time range)  sodium chloride 0.9 % bolus 1,000 mL (1,000 mLs Intravenous New Bag/Given 02/04/22 1009)  magnesium sulfate IVPB 2 g 50 mL (2 g Intravenous New Bag/Given 02/04/22 1012)     The patient appears reasonably screen and/or stabilized for discharge and I doubt any other medical condition or other [facility] requiring further screening, evaluation, or treatment in the ED at this time prior to discharge.          Final Clinical Impression(s) / ED Diagnoses Final diagnoses:  Vertigo    Rx / DC Orders ED Discharge Orders          Ordered    Ambulatory referral to Neurology       Comments: New headache syndrome   02/04/22 1102  meclizine (ANTIVERT) 25 MG tablet  3 times daily PRN        02/04/22 1103              Deno Etienne,  DO 02/04/22 1104

## 2022-02-04 NOTE — Progress Notes (Deleted)
S:     PCP: Dr. Fredonia Highland Danielle Barker is a 46 y.o. female who presents for diabetes evaluation, education, and management.  PMH is significant for T2DM, bipolar disorder, GAD, insomnia.   Patient was referred and last seen by Primary Care Provider, Freeman Caldron on 01/01/22. At that visit, Ozempic dose was increased to 2mg  once daily.  Today, patient arrives in *** good spirits and presents without *** any assistance. ***  Patient reports Diabetes was diagnosed in ***.   Family/Social History: ***  Current diabetes medications include: Lantus 40 units once daily, metformin 500mg  XR 2 tablets daily, Novolog sliding scale, Ozempic 2mg  once daily Current hypertension medications include: HCTZ 12.5mg  once daily Current hyperlipidemia medications include: fenofibrate 48mg  once daily, rosuvastatin 20mg  once daily  Patient reports adherence to taking all medications as prescribed.  *** Patient denies adherence with medications, reports missing *** medications *** times per week, on average.  Do you feel that your medications are working for you? {YES NO:22349} Have you been experiencing any side effects to the medications prescribed? {YES NO:22349} Do you have any problems obtaining medications due to transportation or finances? {YES [id number] Insurance coverage: ***  Patient {Actions; denies-reports:120008} hypoglycemic events.  Reported home fasting blood sugars: ***  Reported 2 hour post-meal/random blood sugars: ***.  Patient {Actions; denies-reports:120008} nocturia (nighttime urination).  Patient {Actions; denies-reports:120008} neuropathy (nerve pain). Patient {Actions; denies-reports:120008} visual changes. Patient {Actions; denies-reports:120008} self foot exams.   Patient reported dietary habits: Eats *** meals/day Breakfast: *** Lunch: *** Dinner: *** Snacks: *** Drinks: ***  Within the past 12 months, did you worry whether your food would run out before you  got money to buy more? {YES NO:22349} Within the past 12 months, did the food you bought run out, and you didn't have money to get more? {YES NO:22349} PHQ-9 Score: ***  Patient-reported exercise habits: ***   O:   ROS  Physical Exam  7 day average blood glucose: ***  *** CGM Download:  % Time CGM is active: ***% Average Glucose: *** mg/dL Glucose Management Indicator: ***  Glucose Variability: *** (goal <36%) Time in Goal:  - Time in range 70-180: ***% - Time above range: ***% - Time below range: ***% Observed patterns:   Lab Results  Component Value Date   HGBA1C 7.5 (A) 01/01/2022   There were no vitals filed for this visit.  Lipid Panel     Component Value Date/Time   CHOL 147 01/01/2022 0928   TRIG 143 01/01/2022 0928   HDL 39 (L) 01/01/2022 0928   CHOLHDL 3.8 01/01/2022 0928   LDLCALC 83 01/01/2022 0928    Clinical Atherosclerotic Cardiovascular Disease (ASCVD): No  The 10-year ASCVD risk score (Arnett DK, et al., 2019) is: 1.9%   Values used to calculate the score:     Age: 42 years     Sex: Female     Is Non-Hispanic African American: No     Diabetic: Yes     Tobacco smoker: No     Systolic Blood Pressure: 950 mmHg     Is BP treated: No     HDL Cholesterol: 39 mg/dL     Total Cholesterol: 147 mg/dL    A/P: Diabetes longstanding *** currently ***. Patient is *** able to verbalize appropriate hypoglycemia management plan. Medication adherence appears ***. Control is suboptimal due to ***. -{Meds adjust:18428} basal insulin *** (insulin ***). Patient will continue to titrate 1 unit every *** days if fasting blood  sugar > 100mg /dl until fasting blood sugars reach goal or next visit.  -{Meds adjust:18428} rapid insulin *** (insulin ***) to ***.  -{Meds adjust:18428} GLP-1 *** (generic ***) to ***.  -{Meds adjust:18428} SGLT2-I *** (generic ***) to ***. Counseled on sick day rules. -{Meds adjust:18428} metformin *** to ***.  -Patient educated on  purpose, proper use, and potential adverse effects of ***.  -Extensively discussed pathophysiology of diabetes, recommended lifestyle interventions, dietary effects on blood sugar control.  -Counseled on s/sx of and management of hypoglycemia.  -Next A1c anticipated ***.   ASCVD risk - primary ***secondary prevention in patient with diabetes. Last LDL is *** not at goal of <70 *** mg/dL. ASCVD risk factors include *** and 10-year ASCVD risk score of ***. {Desc; low/moderate/high:110033} intensity statin indicated.  -{Meds adjust:18428} ***statin *** mg.   Hypertension longstanding currently controlled. Blood pressure goal of <130/80 mmHg. Medication adherence ***. Blood pressure control is suboptimal due to ***. -***  Written patient instructions provided. Patient verbalized understanding of treatment plan.  Total time in face to face counseling *** minutes.    Follow-up:  Pharmacist ***. PCP clinic visit in ***.  Patient seen with ***.

## 2022-02-05 ENCOUNTER — Ambulatory Visit: Payer: Self-pay | Admitting: Pharmacist

## 2022-02-10 ENCOUNTER — Other Ambulatory Visit: Payer: Self-pay

## 2022-02-17 ENCOUNTER — Other Ambulatory Visit: Payer: Self-pay

## 2022-02-18 ENCOUNTER — Ambulatory Visit (HOSPITAL_COMMUNITY): Payer: No Payment, Other | Admitting: Psychiatry

## 2022-02-18 ENCOUNTER — Other Ambulatory Visit: Payer: Self-pay

## 2022-02-18 DIAGNOSIS — F3181 Bipolar II disorder: Secondary | ICD-10-CM

## 2022-02-18 DIAGNOSIS — F410 Panic disorder [episodic paroxysmal anxiety] without agoraphobia: Secondary | ICD-10-CM

## 2022-02-18 MED ORDER — LAMOTRIGINE 150 MG PO TABS
150.0000 mg | ORAL_TABLET | Freq: Every day | ORAL | 2 refills | Status: DC
  Filled 2022-02-18 – 2022-03-03 (×2): qty 30, 30d supply, fill #0

## 2022-02-18 MED ORDER — ESCITALOPRAM OXALATE 20 MG PO TABS
40.0000 mg | ORAL_TABLET | Freq: Every day | ORAL | 2 refills | Status: DC
  Filled 2022-02-18 – 2022-03-23 (×2): qty 60, 30d supply, fill #0

## 2022-02-18 MED ORDER — ARIPIPRAZOLE 30 MG PO TABS
30.0000 mg | ORAL_TABLET | Freq: Every day | ORAL | 2 refills | Status: DC
  Filled 2022-02-18 – 2022-03-03 (×2): qty 30, 30d supply, fill #0

## 2022-02-18 NOTE — Progress Notes (Cosign Needed Addendum)
BH MD/PA/NP OP Progress Note  02/18/2022 3:18 PM VERBLE STYRON  MRN:  [id number]  Chief Complaint:  Chief Complaint  Patient presents with   Follow-up   Medication Refill   HPI:   [place] is a 46 year old female with a past psychiatric history significant for generalized anxiety disorder with panic attacks and bipolar 2 disorder who presents to [facility] walk-in for follow-up and medication management.  Patient is currently being managed on the following medications:  Clonazepam 1 mg 3 times daily as needed Abilify 30 mg daily Escitalopram (Lexapro) 40 mg daily Lamotrigine 100 mg daily  Patient reports that she has been feeling very anxious and going through a lot of work stress.  She works as a Occupational psychologist at [employer].  Patient endorses the multiple stressors stressors, including work stress, nephew going to have psych evaluation for autism, financial stressors, and her sister-in-law suffering from a massive stroke.  She reports that she is having panic attacks more frequently 3-5 times per week.  She reports that she saw a counselor twice and planning to see her every 2-3 weeks.  She is requesting refills for her medications. She reports that when she is manic she spends a lot of money, sleeps less, speaks fast and drinks a lot.  She reports that her manic symptoms are much better with medications.  She denies previous suicidal attempts.  She was hospitalized psychiatrically 3 times with most recent admission 10 years ago in [place].  She moved [place]  1[place]2 years ago. Patient is alert and oriented x4, calm, cooperative, and fully engaged in conversation during the encounter.  Patient denies suicidal or homicidal ideation.  She further denies auditory or visual hallucinations and does not appear to be responding to internal/external stimuli.  Patient reports that she is able to get 6 to 7 hours of sleep but gets vivid dreams.  She  reports that her energy and appetite are good and her mood has been better.  Patient denies alcohol consumption, tobacco use, and illicit drug use.  She lives with her mom.   Discussed increasing Lamictal to 150 mg for mood stabilization and decreasing Klonopin to twice daily as needed for anxiety. Discussed that benzodiazepines are not recommended for long-term use due to dependence and resistance.  Patient agrees to lower the dose of Klonopin. She agrees with the plan. Visit Diagnosis:    ICD-10-CM   1. Bipolar 2 disorder (HCC)  F31.81 ARIPiprazole (ABILIFY) 30 MG tablet    escitalopram (LEXAPRO) 20 MG tablet    lamoTRIgine (LAMICTAL) 150 MG tablet    2. Generalized anxiety disorder with panic attacks  F41.1 escitalopram (LEXAPRO) 20 MG tablet   F41.0       Past Psychiatric History:  Insomnia Generalized anxiety disorder Bipolar 2 disorder Denies SA.  Hospitalized psychiatrically 3 times most recently 10 years ago in [place] Past Medical History:  Past Medical History:  Diagnosis Date   Diabetes mellitus without complication ([place])    High cholesterol    High triglycerides    HSV (herpes simplex virus) infection    Mood disorder (HCC)     Past Surgical History:  Procedure Laterality Date   APPENDECTOMY     CHOLECYSTECTOMY     SHOULDER SURGERY Left    TONSILLECTOMY      Family Psychiatric History:  Aunt (maternal) - schizophrenic Mother - unipolar/severe depression Father - Alcohol abuse, anxiety, possible bipolar but has not been diagnosed Oncologist Brother -  anxious, paranoid Biological Sister - Anxiety, recovering alcoholic (sober for 4 years)  Family History:  Family History  Problem Relation Age of Onset   Cancer Mother    Heart failure Mother    Cancer Father     Social History:  Social History   Socioeconomic History   Marital status: Divorced    Spouse name: Not on file   Number of children: Not on file   Years of education: Not on file   Highest  education level: Not on file  Occupational History   Not on file  Tobacco Use   Smoking status: Never   Smokeless tobacco: Never  Vaping Use   Vaping Use: Never used  Substance and Sexual Activity   Alcohol use: Yes    Comment: rarely   Drug use: Never   Sexual activity: Yes  Other Topics Concern   Not on file  Social History Narrative   Not on file   Social Determinants of Health   Financial Resource Strain: Not on file  Food Insecurity: Not on file  Transportation Needs: Not on file  Physical Activity: Not on file  Stress: Not on file  Social Connections: Not on file    Allergies:  Allergies  Allergen Reactions   Compazine [Prochlorperazine]    Dilaudid [Hydromorphone]     intolerence   Reglan [Metoclopramide]    Zofran [Ondansetron]     Metabolic Disorder Labs: Lab Results  Component Value Date   HGBA1C 7.5 (A) 01/01/2022   No results found for: "PROLACTIN" Lab Results  Component Value Date   CHOL 147 01/01/2022   TRIG 143 01/01/2022   HDL 39 (L) 01/01/2022   CHOLHDL 3.8 01/01/2022   LDLCALC 83 01/01/2022   Lab Results  Component Value Date   TSH 3.727 03/05/2021    Therapeutic Level Labs: No results found for: "LITHIUM" No results found for: "VALPROATE" No results found for: "CBMZ"  Current Medications: Current Outpatient Medications  Medication Sig Dispense Refill   ARIPiprazole (ABILIFY) 30 MG tablet Take 1 tablet (30 mg total) by mouth daily. 30 tablet 2   Blood Glucose Monitoring Suppl (TRUE METRIX METER) w/Device KIT use kit to check blood glucose 3 three times daily before meals. 1 kit 0   clonazePAM (KLONOPIN) 1 MG tablet Take 1 tablet (1 mg total) by mouth 3 (three) times daily as needed for anxiety. 90 tablet 1   dicyclomine (BENTYL) 20 MG tablet Take 1 tablet (20 mg total) by mouth 2 (two) times daily. (Patient not taking: Reported on 01/01/2022) 20 tablet 0   escitalopram (LEXAPRO) 20 MG tablet Take 2 tablets (40 mg total) by mouth  daily. 60 tablet 2   fenofibrate (TRICOR) 48 MG tablet Take 1 tablet (48 mg total) by mouth daily. 90 tablet 2   gabapentin (NEURONTIN) 300 MG capsule Take 2 capsules (600 mg total) by mouth 3 (three) times daily. 180 capsule 3   glucose blood (TRUE METRIX BLOOD GLUCOSE TEST) test strip Use 1 test strip to check blood glucose 3 times daily 100 each 12   hydrochlorothiazide (HYDRODIURIL) 12.5 MG tablet Take 1 tablet (12.5 mg total) by mouth daily (Must have office visit for refills) 90 tablet 0   HYDROcodone-acetaminophen (NORCO) 5-325 MG tablet Take 1 tablet by mouth every 6 (six) hours as needed for moderate pain. (Patient not taking: Reported on 01/01/2022) 5 tablet 0   insulin aspart (NOVOLOG FLEXPEN) 100 UNIT/ML FlexPen Inject 0-12 Units into the skin 3 (three) times daily  with meals. As per sliding scale 15 mL 11   insulin glargine (LANTUS) 100 UNIT/ML injection Inject 0.4 mLs (40 Units total) into the skin 2 (two) times daily. 20 mL 3   Insulin Syringe-Needle U-100 (TRUEPLUS INSULIN SYRINGE) 31G X 5/16" 1 ML MISC Use to inject Lantus twice a day. 100 each 3   lamoTRIgine (LAMICTAL) 150 MG tablet Take 1 tablet (150 mg total) by mouth daily. 30 tablet 2   meclizine (ANTIVERT) 25 MG tablet Take 1 tablet (25 mg total) by mouth 3 (three) times daily as needed for dizziness. 30 tablet 0   metFORMIN (GLUCOPHAGE-XR) 500 MG 24 hr tablet Take one daily with food for one week then two daily with food 180 tablet 1   metroNIDAZOLE (FLAGYL) 500 MG tablet Take 1 tablet (500 mg total) by mouth 2 (two) times daily. 14 tablet 0   naproxen (NAPROSYN) 500 MG tablet Take 1 tablet (500 mg total) by mouth 2 (two) times daily. (Patient not taking: Reported on 01/01/2022) 30 tablet 0   rosuvastatin (CRESTOR) 20 MG tablet Take 1 tablet (20 mg total) by mouth daily. 90 tablet 1   Semaglutide, 2 MG/DOSE, 8 MG/3ML SOPN Inject 2 mg as directed once a week. 9 mL 2   TRUEplus Lancets 28G MISC use 1 lancet to check blood glucose 3  times daily before meals. 100 each 12   valACYclovir (VALTREX) 500 MG tablet Take 1 tablet (500 mg total) by mouth daily for prophylaxis. If out break occurs take 1 tablet twice a day x 5 days as needed for outbreak 120 tablet 1   No current facility-administered medications for this visit.     Musculoskeletal: Strength & Muscle Tone: within normal limits Gait & Station: normal Patient leans: N/A  Psychiatric Specialty Exam: Review of Systems  Psychiatric/Behavioral:  Negative for decreased concentration, dysphoric mood, hallucinations, self-injury, sleep disturbance and suicidal ideas. The patient is nervous/anxious. The patient is not hyperactive.     Blood pressure 121/79, pulse 88, SpO2 98 %.There is no height or weight on file to calculate BMI.  General Appearance: Fairly Groomed  Eye Contact:  Good  Speech:  Clear and Coherent and Normal Rate  Volume:  Normal  Mood:  Anxious and Euthymic  Affect:  Congruent  Thought Process:  Coherent, Goal Directed, and Descriptions of Associations: Intact  Orientation:  Full (Time, Place, and Person)  Thought Content: WDL   Suicidal Thoughts:  No  Homicidal Thoughts:  No  Memory:  Immediate;   Good Recent;   Good Remote;   Good  Judgement:  Good  Insight:  Good  Psychomotor Activity:  Normal  Concentration:  Concentration: Good and Attention Span: Good  Recall:  Good  Fund of Knowledge: Good  Language: Good  Akathisia:  No  Handed:  Right  AIMS (if indicated): not done  Assets:  Communication Skills Desire for Improvement Financial Resources/Insurance Housing Social Support Transportation Vocational/Educational  ADL's:  Intact  Cognition: WNL  Sleep:  Good   Screenings: GAD-7    Physiological scientist Office Visit from 01/01/2022 in [place] Office Visit from 12/12/2021 in [facility] Office Visit from 08/26/2021 in [facility] Office Visit from  07/15/2021 in [facility] Office Visit from 06/16/2021 in [place]  Total GAD-7 Score $RemoveBef'7 21 13 15 6      'UZrahIxbsj$ PHQ2-9    [place] Office Visit from 01/01/2022  in [place] Office Visit from 12/12/2021 in [facility] Office Visit from 12/11/2021 in [facility] Office Visit from 08/26/2021 in [facility] Office Visit from 07/15/2021 in [facility]  PHQ-2 Total Score 0 0 0 2 3  PHQ-9 Total Score -- -- -- 6 9      Flowsheet Row ED from 02/04/2022 in [facility] ED from 01/09/2022 in [facility] Office Visit from 12/12/2021 in [place] No Risk No Risk Low Risk        Assessment and Plan:   [place] is a 46 year old female with a past psychiatric history significant for generalized anxiety disorder with panic attacks and bipolar 2 disorder who presents to [facility] walk-in for follow-up and medication refill.  Patient reports increase in her anxiety due to current stressors.  Will increase Lamictal for mood stabilization and decrease Klonopin.  Per PDMP, patient last filled Klonopin 30-day supply (90 tablets) on 01/18/2022.  Patient's medications to be prescribed to pharmacy of choice.  Collaboration of Care: Collaboration of Care: Dr. Sande Rives  Patient/Guardian was advised Release of Information must be obtained prior to any record release in order to collaborate their care with an outside provider. Patient/Guardian was advised if they have not already done so to contact the registration department to sign all necessary forms in order for [place] to release information regarding their care.   Consent: Patient/Guardian gives verbal consent for  treatment and assignment of benefits for services provided during this visit. Patient/Guardian expressed understanding and agreed to proceed.   1. Bipolar 2 disorder (HCC)  - ARIPiprazole (ABILIFY) 30 MG tablet; Take 1 tablet (30 mg total) by mouth daily.  Dispense: 30 tablet; Refill: 2 -Increase lamoTRIgine to (LAMICTAL) 150 MG tablet; Take 1 and half tablet (100 mg total) by mouth daily.  Dispense:45 tablet; Refill: 2 - escitalopram (LEXAPRO) 20 MG tablet; Take 2 tablets (40 mg total) by mouth daily.  Dispense: 60 tablet; Refill: 2  2. Generalized anxiety disorder with panic attacks  - escitalopram (LEXAPRO) 20 MG tablet; Take 2 tablets (40 mg total) by mouth daily.  Dispense: 60 tablet; Refill: 2 -Decrease clonazepam 1 mg twice daily as needed for anxiety.  [facility] app was not working so I called pharmacy for 30-day supply with 0 refills. Patient to follow up in 8 weeks. Provider spent a total of 20 minutes with the patient/reviewing patient's chart  Armando Reichert, MD 02/18/2022, 3:18 PM  I reviewed the patient's chart and discussed the patient and plan of care with the resident. I agree with the findings and plan as documented in the resident's note and above addendum. Agree with pursuing taper of Klonopin and optimization of lamotrigine.    Alda Berthold, MD 02/27/22

## 2022-02-19 ENCOUNTER — Other Ambulatory Visit: Payer: Self-pay

## 2022-02-26 ENCOUNTER — Other Ambulatory Visit: Payer: Self-pay

## 2022-03-03 ENCOUNTER — Other Ambulatory Visit: Payer: Self-pay

## 2022-03-16 ENCOUNTER — Other Ambulatory Visit: Payer: Self-pay

## 2022-03-17 ENCOUNTER — Other Ambulatory Visit: Payer: Self-pay

## 2022-03-18 ENCOUNTER — Emergency Department (HOSPITAL_COMMUNITY): Payer: Self-pay

## 2022-03-18 ENCOUNTER — Emergency Department (HOSPITAL_COMMUNITY)
Admission: EM | Admit: 2022-03-18 | Discharge: 2022-03-18 | Disposition: A | Discharge status: Home / Self Care | Payer: Self-pay | Attending: Emergency Medicine | Admitting: Emergency Medicine

## 2022-03-18 ENCOUNTER — Encounter (HOSPITAL_COMMUNITY): Payer: Self-pay | Admitting: *Deleted

## 2022-03-18 ENCOUNTER — Other Ambulatory Visit: Payer: Self-pay

## 2022-03-18 DIAGNOSIS — R0602 Shortness of breath: Secondary | ICD-10-CM | POA: Insufficient documentation

## 2022-03-18 DIAGNOSIS — Z7984 Long term (current) use of oral hypoglycemic drugs: Secondary | ICD-10-CM | POA: Insufficient documentation

## 2022-03-18 DIAGNOSIS — R079 Chest pain, unspecified: Secondary | ICD-10-CM | POA: Insufficient documentation

## 2022-03-18 DIAGNOSIS — E119 Type 2 diabetes mellitus without complications: Secondary | ICD-10-CM | POA: Insufficient documentation

## 2022-03-18 DIAGNOSIS — Z794 Long term (current) use of insulin: Secondary | ICD-10-CM | POA: Insufficient documentation

## 2022-03-18 LAB — BASIC METABOLIC PANEL
Anion gap: 8 (ref 5–15)
BUN: 17 mg/dL (ref 6–20)
CO2: 26 mmol/L (ref 22–32)
Calcium: 9 mg/dL (ref 8.9–10.3)
Chloride: 102 mmol/L (ref 98–111)
Creatinine, Ser: 0.75 mg/dL (ref 0.44–1.00)
GFR, Estimated: >60 mL/min (ref >60)
Glucose, Bld: 177 mg/dL — ABNORMAL HIGH (ref 70–99)
Potassium: 3.9 mmol/L (ref 3.5–5.1)
Sodium: 136 mmol/L (ref 135–145)

## 2022-03-18 LAB — CBC
HCT: 36.4 % (ref 36.0–46.0)
Hemoglobin: 12.2 g/dL (ref 12.0–15.0)
MCH: 30.9 pg (ref 26.0–34.0)
MCHC: 33.5 g/dL (ref 30.0–36.0)
MCV: 92.2 fL (ref 80.0–100.0)
Platelets: 343 10*3/uL (ref 150–400)
RBC: 3.95 MIL/uL (ref 3.87–5.11)
RDW: 12.3 % (ref 11.5–15.5)
WBC: 6.6 10*3/uL (ref 4.0–10.5)
nRBC: 0 % (ref 0.0–0.2)

## 2022-03-18 LAB — POC URINE PREG, ED: Preg Test, Ur: NEGATIVE

## 2022-03-18 LAB — TROPONIN I (HIGH SENSITIVITY): Troponin I (High Sensitivity): <2 ng/L (ref <18)

## 2022-03-18 NOTE — ED Provider Notes (Signed)
[facility] EMERGENCY DEPARTMENT Provider Note   CSN: [id number] Arrival date & time: 03/18/22  1206     History  Chief Complaint  Patient presents with   Chest Pain    [place] is a 46 y.o. female with history of diabetes, hyperlipidemia, bipolar type II disorder who presents the emergency department complaining of chest pain starting abruptly while at work this morning.  Patient states that her pain is located in the middle of her chest and radiates to her upper back.  Associated shortness of breath with exertion today.  Felt clammy at work, and after trying to get up and move around she fell like she might pass out.  No shortness of breath at rest.  No leg pain or swelling.  No history of blood clots, recent illness, or recent travel.  Reports significant family history of cardiac disease, her father dying of an MI at age 31. Patient also reports increased stress that may be contributing to her symptoms.    Chest Pain Associated symptoms: shortness of breath   Associated symptoms: no abdominal pain, no cough, no fever, no nausea, no palpitations and no vomiting        Home Medications Prior to Admission medications   Medication Sig Start Date End Date Taking? Authorizing Provider  ARIPiprazole (ABILIFY) 30 MG tablet Take 1 tablet (30 mg total) by mouth daily. 02/18/22  Yes Doda, Edd Arbour, MD  clonazePAM (KLONOPIN) 1 MG tablet Take 1 tablet (1 mg total) by mouth 3 (three) times daily as needed for anxiety. 10/21/21 10/21/22 Yes Eulis Canner E, NP  escitalopram (LEXAPRO) 20 MG tablet Take 2 tablets (40 mg total) by mouth daily. 02/18/22  Yes Doda, Edd Arbour, MD  fenofibrate (TRICOR) 48 MG tablet Take 1 tablet (48 mg total) by mouth daily. 01/01/22  Yes Argentina Donovan, PA-C  gabapentin (NEURONTIN) 300 MG capsule Take 2 capsules (600 mg total) by mouth 3 (three) times daily. 01/01/22 03/19/22 Yes McClung, Dionne Bucy, PA-C  hydrochlorothiazide (HYDRODIURIL) 12.5 MG tablet Take 1  tablet (12.5 mg total) by mouth daily (Must have office visit for refills) 01/01/22  Yes Thereasa Solo, Levada Dy M, PA-C  insulin aspart (NOVOLOG FLEXPEN) 100 UNIT/ML FlexPen Inject 0-12 Units into the skin 3 (three) times daily with meals. As per sliding scale 01/01/22  Yes McClung, Angela M, PA-C  insulin glargine (LANTUS) 100 UNIT/ML injection Inject 0.4 mLs (40 Units total) into the skin 2 (two) times daily. 01/01/22  Yes Argentina Donovan, PA-C  lamoTRIgine (LAMICTAL) 150 MG tablet Take 1 tablet (150 mg total) by mouth daily. 02/18/22  Yes Armando Reichert, MD  meclizine (ANTIVERT) 25 MG tablet Take 1 tablet (25 mg total) by mouth 3 (three) times daily as needed for dizziness. 02/04/22  Yes Deno Etienne, DO  metFORMIN (GLUCOPHAGE-XR) 500 MG 24 hr tablet Take one daily with food for one week then two daily with food Patient taking differently: Take 500 mg by mouth 2 (two) times daily with a meal. 01/01/22  Yes McClung, Angela M, PA-C  rosuvastatin (CRESTOR) 20 MG tablet Take 1 tablet (20 mg total) by mouth daily. 01/01/22  Yes McClung, Dionne Bucy, PA-C  Semaglutide, 2 MG/DOSE, 8 MG/3ML SOPN Inject 2 mg as directed once a week. 01/01/22  Yes McClung, Dionne Bucy, PA-C  valACYclovir (VALTREX) 500 MG tablet Take 1 tablet (500 mg total) by mouth daily for prophylaxis. If out break occurs take 1 tablet twice a day x 5 days as needed for outbreak 01/01/22  Yes Freeman Caldron M, PA-C  Blood Glucose Monitoring Suppl (TRUE METRIX METER) w/Device KIT use kit to check blood glucose 3 three times daily before meals. 03/31/21   Charlott Rakes, MD  glucose blood (TRUE METRIX BLOOD GLUCOSE TEST) test strip Use 1 test strip to check blood glucose 3 times daily 03/31/21   Charlott Rakes, MD  Insulin Syringe-Needle U-100 (TRUEPLUS INSULIN SYRINGE) 31G X 5/16" 1 ML MISC Use to inject Lantus twice a day. 01/01/22   Argentina Donovan, PA-C  TRUEplus Lancets 28G MISC use 1 lancet to check blood glucose 3 times daily before meals. 03/31/21   Charlott Rakes, MD  citalopram (CELEXA) 40 MG tablet Take 40 mg by mouth daily.  11/25/20  [provider]      Allergies    Dilaudid [hydromorphone], Compazine [prochlorperazine], Reglan [metoclopramide], and Zofran [ondansetron]    Review of Systems   Review of Systems  Constitutional:  Negative for chills and fever.  Respiratory:  Positive for shortness of breath. Negative for cough and wheezing.   Cardiovascular:  Positive for chest pain. Negative for palpitations and leg swelling.  Gastrointestinal:  Negative for abdominal pain, nausea and vomiting.  Neurological:  Positive for light-headedness. Negative for syncope.  All other systems reviewed and are negative.   Physical Exam Updated Vital Signs BP 106/74   Pulse 76   Temp 98.1 F (36.7 C) (Oral)   Resp 16   Ht _0  (1.651 m)   Wt 92.1 kg   LMP  (Within Weeks)   SpO2 98%   BMI 33.78 kg/m  Physical Exam Vitals and nursing note reviewed.  Constitutional:      Appearance: Normal appearance.  HENT:     Head: Normocephalic and atraumatic.  Eyes:     Conjunctiva/sclera: Conjunctivae normal.  Cardiovascular:     Rate and Rhythm: Normal rate and regular rhythm.  Pulmonary:     Effort: Pulmonary effort is normal. No respiratory distress.     Breath sounds: Normal breath sounds.  Abdominal:     General: There is no distension.     Palpations: Abdomen is soft.     Tenderness: There is no abdominal tenderness.  Skin:    General: Skin is warm and dry.  Neurological:     General: No focal deficit present.     Mental Status: She is alert.     ED Results / Procedures / Treatments   Labs (all labs ordered are listed, but only abnormal results are displayed) Labs Reviewed  BASIC METABOLIC PANEL - Abnormal; Notable for the following components:      Result Value   Glucose, Bld 177 (*)    All other components within normal limits  CBC  POC URINE PREG, ED  TROPONIN I (HIGH SENSITIVITY)     EKG None  Radiology DG Chest 2 View  Result Date: 03/18/2022 CLINICAL DATA:  Chest pain and shortness of breath. EXAM: CHEST - 2 VIEW COMPARISON:  01/09/2022 FINDINGS: The cardiac silhouette, mediastinal and hilar contours are within normal limits. The lungs are clear. No pleural effusions or pulmonary lesions. No pneumothorax. The bony thorax is intact. IMPRESSION: No acute cardiopulmonary findings. Electronically Signed   By: Marijo Sanes M.D.   On: 03/18/2022 13:15    Procedures Procedures    Medications Ordered in ED Medications - No data to display  ED Course/ Medical Decision Making/ A&P  Medical Decision Making Amount and/or Complexity of Data Reviewed Labs: ordered. Radiology: ordered.   This patient is a 46 y.o. female  who presents to the ED for concern of chest pain.   Differential diagnoses prior to evaluation: The emergent differential diagnosis includes, but is not limited to,  ACS, pericarditis, aortic dissection, PE, pneumothorax, esophageal spasm or rupture, chronic angina, valvular disease, cardiomyopathy, myocarditis, pulmonary HTN, pneumonia, bronchitis, GERD, reflux/PUD, biliary disease, pancreatitis, disk disease, costochondritis, anxiety or panic attack. This is not an exhaustive differential.   Past Medical History / Co-morbidities: diabetes, hyperlipidemia, bipolar type II disorder  Physical Exam: Physical exam performed. The pertinent findings include: Normal vital signs.  Heart regular rate and rhythm, lungs clear.  No increased work of breathing.  Upon reevaluation patient states that her symptoms are easing off while she is resting and not thinking about her current stressors.   Lab Tests/Imaging studies: I personally interpreted labs/imaging and the pertinent results include: No leukocytosis, normal hemoglobin.  BMP unremarkable.  Troponin less than 2.  Negative pregnancy.  X-ray without acute cardiopulmonary  abnormalities.  I agree with the radiologist interpretation.  Cardiac monitoring: EKG obtained and interpreted by my attending physician which shows: Normal sinus rhythm with a rate in the 80s   Disposition: After consideration of the diagnostic results and the patients response to treatment, I feel that emergency department workup does not suggest an emergent condition requiring admission or immediate intervention beyond what has been performed at this time.   Patient is to be discharged with recommendation to follow up with PCP in regards to today's hospital visit. Chest pain is not likely of cardiac or pulmonary etiology d/t presentation, PERC or PE negative, VSS, no tracheal deviation, no JVD or new murmur, RRR, breath sounds equal bilaterally, EKG without acute abnormalities, negative troponin, and negative CXR. Heart score of 4. Explained that I would like to check a delta troponin level, but patient states she is feeling well and wants to go home. Since her symptoms started earlier this morning and her troponin level is still <2, I would've suspected this to be increased due to typical troponin elevation trends. Pt has been advised to return to the ED if CP becomes exertional, associated with diaphoresis or nausea, radiates to left jaw/arm, worsens or becomes concerning in any way. Pt appears reliable for follow up and is agreeable to discharge.   Final Clinical Impression(s) / ED Diagnoses Final diagnoses:  Nonspecific chest pain    Rx / DC Orders ED Discharge Orders     None      Portions of this report may have been transcribed using voice recognition software. Every effort was made to ensure accuracy; however, inadvertent computerized transcription errors may be present.    Estill Cotta 03/18/22 1451    Milton Ferguson, MD 03/21/22 1000

## 2022-03-18 NOTE — ED Notes (Signed)
Pt wanting to speak with pa about second trop. Pa aware

## 2022-03-18 NOTE — ED Triage Notes (Signed)
Pt c/o mid "crushing" chest pain, SOB, clammy, back pain that started suddenly while at work. Pt reports increased stress lately.

## 2022-03-18 NOTE — Discharge Instructions (Signed)
You were seen in the emergency department today for chest pain.  As we discussed your lab work, EKG, chest x-ray all looked reassuring today.    I recommend monitoring your stress levels.  Continue to monitor how you are doing overall, and return to the emergency department for any new or worsening symptoms such as: Worsening pain or pain with exertion, difficulty breathing, sweating, or pain or swelling in your legs.

## 2022-03-18 NOTE — ED Notes (Signed)
See triage notes. Pt color wnl. Non diaphoretic or clammy at this time. States pain to mid chest only radiates into back with sob. Pt states she feels sob at present, no obvious sob/resp distress noted. Pt able to finish sentences.

## 2022-03-18 NOTE — ED Notes (Signed)
Pt taken to xray 

## 2022-03-24 ENCOUNTER — Other Ambulatory Visit: Payer: Self-pay

## 2022-03-25 ENCOUNTER — Other Ambulatory Visit: Payer: Self-pay

## 2022-03-28 ENCOUNTER — Other Ambulatory Visit: Payer: Self-pay

## 2022-03-28 ENCOUNTER — Emergency Department (HOSPITAL_COMMUNITY)
Admission: EM | Admit: 2022-03-28 | Discharge: 2022-03-28 | Discharge status: Against Medical Advice | Payer: Self-pay | Attending: Emergency Medicine | Admitting: Emergency Medicine

## 2022-03-28 DIAGNOSIS — R079 Chest pain, unspecified: Secondary | ICD-10-CM | POA: Insufficient documentation

## 2022-03-28 DIAGNOSIS — Z5321 Procedure and treatment not carried out due to patient leaving prior to being seen by health care provider: Secondary | ICD-10-CM | POA: Insufficient documentation

## 2022-03-28 NOTE — ED Notes (Signed)
Pt requested I take her IV out so she could leave.  Stated "this is taking too long"

## 2022-03-28 NOTE — ED Triage Notes (Signed)
Pt BIB [facility] from home c/o centralized CP that radiates into her back and left arm. Pt took 324 ASA before EMS arrived.

## 2022-03-28 NOTE — ED Provider Triage Note (Signed)
Emergency Medicine Provider Triage Evaluation Note  Danielle Barker , a 46 y.o. female  was evaluated in triage.  Pt complains of centralized chest pain starting earlier this afternoon. Took 324 mg ASA prior to EMS arrival. Wasn't given nitro due to soft blood pressures. CP radiates into back and left arm.   Review of Systems  Positive: CP Negative: SOB  Physical Exam  SpO2 98%  Gen:   Awake, no distress   Resp:  Normal effort  MSK:   Moves extremities without difficulty  Other:    Medical Decision Making  Medically screening exam initiated at 3:12 PM.  Appropriate orders placed.  Arabia Nylund Deahl was informed that the remainder of the evaluation will be completed by another provider, this initial triage assessment does not replace that evaluation, and the importance of remaining in the ED until their evaluation is complete.  Pt seen by myself last week at [facility] ER for similar symptoms. D/c in stable condition after reassuring workup.    Su Monks, PA-C 03/28/22 1512

## 2022-04-01 ENCOUNTER — Encounter: Payer: Self-pay | Admitting: Family Medicine

## 2022-04-01 ENCOUNTER — Other Ambulatory Visit: Payer: Self-pay

## 2022-04-01 ENCOUNTER — Ambulatory Visit: Payer: Self-pay | Attending: Family Medicine | Admitting: Family Medicine

## 2022-04-01 VITALS — BP 107/71 | HR 94 | Temp 98.2°F | Ht 65.0 in | Wt 202.0 lb

## 2022-04-01 DIAGNOSIS — F410 Panic disorder [episodic paroxysmal anxiety] without agoraphobia: Secondary | ICD-10-CM

## 2022-04-01 DIAGNOSIS — E1169 Type 2 diabetes mellitus with other specified complication: Secondary | ICD-10-CM

## 2022-04-01 DIAGNOSIS — E785 Hyperlipidemia, unspecified: Secondary | ICD-10-CM

## 2022-04-01 DIAGNOSIS — Z1211 Encounter for screening for malignant neoplasm of colon: Secondary | ICD-10-CM

## 2022-04-01 DIAGNOSIS — R600 Localized edema: Secondary | ICD-10-CM

## 2022-04-01 DIAGNOSIS — Z794 Long term (current) use of insulin: Secondary | ICD-10-CM

## 2022-04-01 DIAGNOSIS — F411 Generalized anxiety disorder: Secondary | ICD-10-CM

## 2022-04-01 DIAGNOSIS — R0982 Postnasal drip: Secondary | ICD-10-CM

## 2022-04-01 DIAGNOSIS — E1165 Type 2 diabetes mellitus with hyperglycemia: Secondary | ICD-10-CM

## 2022-04-01 LAB — POCT GLYCOSYLATED HEMOGLOBIN (HGB A1C): HbA1c, POC (controlled diabetic range): 8.1 % — AB (ref 0.0–7.0)

## 2022-04-01 LAB — GLUCOSE, POCT (MANUAL RESULT ENTRY): POC Glucose: 183 mg/dl — AB (ref 70–99)

## 2022-04-01 MED ORDER — INSULIN GLARGINE 100 UNIT/ML ~~LOC~~ SOLN
43.0000 [IU] | Freq: Two times a day (BID) | SUBCUTANEOUS | 3 refills | Status: DC
  Filled 2022-04-01 – 2022-05-13 (×2): qty 20, 23d supply, fill #0
  Filled 2022-06-09: qty 20, 23d supply, fill #1
  Filled 2022-06-09: qty 40, 47d supply, fill #1
  Filled 2022-08-05: qty 20, 23d supply, fill #2

## 2022-04-01 MED ORDER — HYDROCHLOROTHIAZIDE 12.5 MG PO TABS
12.5000 mg | ORAL_TABLET | Freq: Every day | ORAL | 1 refills | Status: AC
  Filled 2022-04-01 – 2022-06-23 (×5): qty 90, 90d supply, fill #0

## 2022-04-01 NOTE — Progress Notes (Signed)
Sore throat Nasal drip.

## 2022-04-01 NOTE — Progress Notes (Signed)
Subjective:  Patient ID: Danielle Barker, female    DOB: 04/04/1976  Age: 46 y.o. MRN: [id number]  CC: Diabetes   HPI Danielle Barker is a 46 y.o. year old female with a history of  bipolar disorder (managed by psych), generalized anxiety disorder, type 2 diabetes mellitus (A1c 8.1), hyperlipidemia    Interval History: 3 days ago she noticed cough, post nasal drip  but no sinus pressure or pain, no fever. Cough is sporadic and not productive.  Her sugars run between 150-300 and she endorses adherence with her current medications.  Not up-to-date on annual eye exam.  Denies presence of hypoglycemia, neuropathy.  She does not get much exercise outside of what she gets at work as when she gets home she is too tired.  She goes to [facility] and is being weaned off Klonopin which she states is not going well. Past Medical History:  Diagnosis Date   Diabetes mellitus without complication (HCC)    High cholesterol    High triglycerides    HSV (herpes simplex virus) infection    Mood disorder (HCC)     Past Surgical History:  Procedure Laterality Date   APPENDECTOMY     CHOLECYSTECTOMY     SHOULDER SURGERY Left    TONSILLECTOMY      Family History  Problem Relation Age of Onset   Cancer Mother    Heart failure Mother    Cancer Father     Social History   Socioeconomic History   Marital status: Divorced    Spouse name: Not on file   Number of children: Not on file   Years of education: Not on file   Highest education level: Not on file  Occupational History   Not on file  Tobacco Use   Smoking status: Never   Smokeless tobacco: Never  Vaping Use   Vaping Use: Never used  Substance and Sexual Activity   Alcohol use: Yes    Comment: rarely   Drug use: Never   Sexual activity: Yes  Other Topics Concern   Not on file  Social History Narrative   Not on file   Social Determinants of Health   Financial Resource Strain: Not on file  Food Insecurity:  Not on file  Transportation Needs: Not on file  Physical Activity: Not on file  Stress: Not on file  Social Connections: Not on file    Allergies  Allergen Reactions   Dilaudid [Hydromorphone]     "Makes me feel real stoned out" per pt    Compazine [Prochlorperazine] Palpitations    Tachycardia   Reglan [Metoclopramide] Palpitations    Tachycardia    Zofran [Ondansetron] Palpitations    Tachycardia    Outpatient Medications Prior to Visit  Medication Sig Dispense Refill   ARIPiprazole (ABILIFY) 30 MG tablet Take 1 tablet (30 mg total) by mouth daily. 30 tablet 2   Blood Glucose Monitoring Suppl (TRUE METRIX METER) w/Device KIT use kit to check blood glucose 3 three times daily before meals. 1 kit 0   clonazePAM (KLONOPIN) 1 MG tablet Take 1 tablet (1 mg total) by mouth 3 (three) times daily as needed for anxiety. 90 tablet 1   escitalopram (LEXAPRO) 20 MG tablet Take 2 tablets (40 mg total) by mouth daily. 60 tablet 2   fenofibrate (TRICOR) 48 MG tablet Take 1 tablet (48 mg total) by mouth daily. 90 tablet 2   glucose blood (TRUE METRIX BLOOD GLUCOSE TEST) test strip Use  1 test strip to check blood glucose 3 times daily 100 each 12   insulin aspart (NOVOLOG FLEXPEN) 100 UNIT/ML FlexPen Inject 0-12 Units into the skin 3 (three) times daily with meals. As per sliding scale 15 mL 11   Insulin Syringe-Needle U-100 (TRUEPLUS INSULIN SYRINGE) 31G X 5/16" 1 ML MISC Use to inject Lantus twice a day. 100 each 3   lamoTRIgine (LAMICTAL) 150 MG tablet Take 1 tablet (150 mg total) by mouth daily. 30 tablet 2   meclizine (ANTIVERT) 25 MG tablet Take 1 tablet (25 mg total) by mouth 3 (three) times daily as needed for dizziness. 30 tablet 0   metFORMIN (GLUCOPHAGE-XR) 500 MG 24 hr tablet Take one daily with food for one week then two daily with food (Patient taking differently: Take 500 mg by mouth 2 (two) times daily with a meal.) 180 tablet 1   rosuvastatin (CRESTOR) 20 MG tablet Take 1 tablet  (20 mg total) by mouth daily. 90 tablet 1   Semaglutide, 2 MG/DOSE, 8 MG/3ML SOPN Inject 2 mg as directed once a week. 9 mL 2   TRUEplus Lancets 28G MISC use 1 lancet to check blood glucose 3 times daily before meals. 100 each 12   valACYclovir (VALTREX) 500 MG tablet Take 1 tablet (500 mg total) by mouth daily for prophylaxis. If out break occurs take 1 tablet twice a day x 5 days as needed for outbreak 120 tablet 1   hydrochlorothiazide (HYDRODIURIL) 12.5 MG tablet Take 1 tablet (12.5 mg total) by mouth daily (Must have office visit for refills) 90 tablet 0   insulin glargine (LANTUS) 100 UNIT/ML injection Inject 0.4 mLs (40 Units total) into the skin 2 (two) times daily. 20 mL 3   gabapentin (NEURONTIN) 300 MG capsule Take 2 capsules (600 mg total) by mouth 3 (three) times daily. 180 capsule 3   No facility-administered medications prior to visit.     ROS Review of Systems  Constitutional:  Negative for activity change and appetite change.  HENT:  Negative for sinus pressure and sore throat.   Respiratory:  Negative for chest tightness, shortness of breath and wheezing.   Cardiovascular:  Negative for chest pain and palpitations.  Gastrointestinal:  Negative for abdominal distention, abdominal pain and constipation.  Genitourinary: Negative.   Musculoskeletal: Negative.   Psychiatric/Behavioral:  Negative for behavioral problems and dysphoric mood.     Objective:  BP 107/71   Pulse 94   Temp 98.2 F (36.8 C) (Oral)   Ht _0  (1.651 m)   Wt 202 lb (91.6 kg)   SpO2 97%   BMI 33.61 kg/m      04/01/2022   11:41 AM 03/28/2022    3:13 PM 03/18/2022    2:30 PM  BP/Weight  Systolic BP 782 956 213  Diastolic BP 71 77 74  Wt. (Lbs) 202    BMI 33.61 kg/m2        Physical Exam Constitutional:      Appearance: She is well-developed.  HENT:     Right Ear: Tympanic membrane normal.     Left Ear: Tympanic membrane normal.     Mouth/Throat:     Mouth: Mucous membranes are  moist.     Pharynx: Posterior oropharyngeal erythema present.  Cardiovascular:     Rate and Rhythm: Normal rate.     Heart sounds: Normal heart sounds. No murmur heard. Pulmonary:     Effort: Pulmonary effort is normal.     Breath sounds: Normal breath  sounds. No wheezing or rales.  Chest:     Chest wall: No tenderness.  Abdominal:     General: Bowel sounds are normal. There is no distension.     Palpations: Abdomen is soft. There is no mass.     Tenderness: There is no abdominal tenderness.  Musculoskeletal:        General: Normal range of motion.     Right lower leg: No edema.     Left lower leg: No edema.  Neurological:     Mental Status: She is alert and oriented to person, place, and time.  Psychiatric:        Mood and Affect: Mood normal.      Diabetic Foot Exam - Simple   Simple Foot Form Diabetic Foot exam was performed with the following findings: Yes 04/01/2022 12:23 PM  Visual Inspection No deformities, no ulcerations, no other skin breakdown bilaterally: Yes Sensation Testing Intact to touch and monofilament testing bilaterally: Yes Pulse Check Posterior Tibialis and Dorsalis pulse intact bilaterally: Yes Comments        Latest Ref Rng & Units 03/18/2022    1:23 PM 02/04/2022   10:09 AM 01/09/2022    8:36 PM  CMP  Glucose 70 - 99 mg/dL 177  187  180   BUN 6 - 20 mg/dL _0 Creatinine 0.44 - 1.00 mg/dL 0.75  0.87  0.88   Sodium 135 - 145 mmol/L 136  136  137   Potassium 3.5 - 5.1 mmol/L 3.9  3.9  3.7   Chloride 98 - 111 mmol/L 102  100  99   CO2 22 - 32 mmol/L _1 Calcium 8.9 - 10.3 mg/dL 9.0  9.3  9.8   Total Protein 6.5 - 8.1 g/dL  7.0    Total Bilirubin 0.3 - 1.2 mg/dL  0.4    Alkaline Phos 38 - 126 U/L  77    AST 15 - 41 U/L  15    ALT 0 - 44 U/L  22      Lipid Panel     Component Value Date/Time   CHOL 147 01/01/2022 0928   TRIG 143 01/01/2022 0928   HDL 39 (L) 01/01/2022 0928   CHOLHDL 3.8 01/01/2022 0928   LDLCALC 83  01/01/2022 0928    CBC    Component Value Date/Time   WBC 6.6 03/18/2022 1323   RBC 3.95 03/18/2022 1323   HGB 12.2 03/18/2022 1323   HGB 13.1 01/01/2022 0928   HCT 36.4 03/18/2022 1323   HCT 38.8 01/01/2022 0928   PLT 343 03/18/2022 1323   PLT 390 01/01/2022 0928   MCV 92.2 03/18/2022 1323   MCV 92 01/01/2022 0928   MCH 30.9 03/18/2022 1323   MCHC 33.5 03/18/2022 1323   RDW 12.3 03/18/2022 1323   RDW 11.9 01/01/2022 0928   LYMPHSABS 1.8 02/04/2022 1009   LYMPHSABS 1.7 01/01/2022 0928   MONOABS 0.5 02/04/2022 1009   EOSABS 0.1 02/04/2022 1009   EOSABS 0.1 01/01/2022 0928   BASOSABS 0.0 02/04/2022 1009   BASOSABS 0.1 01/01/2022 0928    Lab Results  Component Value Date   HGBA1C 8.1 (A) 04/01/2022    Assessment & Plan:  1. Type 2 diabetes mellitus with other specified complication, with long-term current use of insulin (HCC) Suboptimally controlled with A1c of 8.1; goal is less than 7.0 Increase Lantus from 40 units twice daily to 43 units twice daily Continue  Ozempic and NovoLog Counseled on Diabetic diet, my plate method, 970 minutes of moderate intensity exercise/week Blood sugar logs with fasting goals of 80-120 mg/dl, random of less than 180 and in the event of sugars less than 60 mg/dl or greater than 400 mg/dl encouraged to notify the clinic. Advised on the need for annual eye exams, annual foot exams, Pneumonia vaccine.  - POCT glucose (manual entry) - POCT glycosylated hemoglobin (Hb A1C) - insulin glargine (LANTUS) 100 UNIT/ML injection; Inject 0.43 mLs (43 Units total) into the skin 2 (two) times daily.  Dispense: 20 mL; Refill: 3 - Microalbumin/Creatinine Ratio, Urine  2. Pedal edema Stable - hydrochlorothiazide (HYDRODIURIL) 12.5 MG tablet; Take 1 tablet (12.5 mg total) by mouth daily.  Dispense: 90 tablet; Refill: 1  3. Screening for colon cancer  Fecal occult blood, imunochemical([facility])  4. Hyperlipidemia associated with type 2 diabetes  mellitus ([place]) Continue statin Low-cholesterol diet  5. Generalized anxiety disorder with panic attacks Stable Management as per [facility] behavioral health  6. Post-nasal drip This could explain her cough. Advised to obtain OTC antihistamine  Health Care Maintenance: Due for diabetic eye exam and we have discussed community resources given she has no medical coverage for referral to ophthalmology Meds ordered this encounter  Medications   insulin glargine (LANTUS) 100 UNIT/ML injection    Sig: Inject 0.43 mLs (43 Units total) into the skin 2 (two) times daily.    Dispense:  20 mL    Refill:  3    Dose increase   hydrochlorothiazide (HYDRODIURIL) 12.5 MG tablet    Sig: Take 1 tablet (12.5 mg total) by mouth daily.    Dispense:  90 tablet    Refill:  1    Follow-up: Return in about 1 month (around 05/02/2022) for Pap smear.       Charlott Rakes, MD, FAAFP. [facility], [place]   04/01/2022, 1:24 PM

## 2022-04-01 NOTE — Patient Instructions (Signed)

## 2022-04-07 ENCOUNTER — Other Ambulatory Visit: Payer: Self-pay

## 2022-04-13 ENCOUNTER — Other Ambulatory Visit: Payer: Self-pay

## 2022-04-13 ENCOUNTER — Ambulatory Visit (INDEPENDENT_AMBULATORY_CARE_PROVIDER_SITE_OTHER): Payer: No Payment, Other | Admitting: Psychiatry

## 2022-04-13 DIAGNOSIS — F3181 Bipolar II disorder: Secondary | ICD-10-CM | POA: Diagnosis not present

## 2022-04-13 DIAGNOSIS — F410 Panic disorder [episodic paroxysmal anxiety] without agoraphobia: Secondary | ICD-10-CM

## 2022-04-13 DIAGNOSIS — F411 Generalized anxiety disorder: Secondary | ICD-10-CM | POA: Diagnosis not present

## 2022-04-13 MED ORDER — ARIPIPRAZOLE 30 MG PO TABS
30.0000 mg | ORAL_TABLET | Freq: Every day | ORAL | 2 refills | Status: DC
  Filled 2022-04-13: qty 30, 30d supply, fill #0
  Filled 2022-06-09: qty 30, 30d supply, fill #1
  Filled 2022-07-21: qty 30, 30d supply, fill #2

## 2022-04-13 MED ORDER — HYDROXYZINE HCL 25 MG PO TABS
25.0000 mg | ORAL_TABLET | Freq: Three times a day (TID) | ORAL | 2 refills | Status: AC | PRN
  Filled 2022-04-13: qty 30, 10d supply, fill #0

## 2022-04-13 MED ORDER — ESCITALOPRAM OXALATE 20 MG PO TABS
20.0000 mg | ORAL_TABLET | Freq: Every day | ORAL | 2 refills | Status: DC
  Filled 2022-04-13: qty 30, 30d supply, fill #0
  Filled 2022-06-09 (×2): qty 30, 30d supply, fill #1

## 2022-04-13 MED ORDER — CLONAZEPAM 1 MG PO TABS: 0.5000 mg | ORAL_TABLET | Freq: Two times a day (BID) | ORAL | 0 refills | Status: DC | PRN

## 2022-04-13 MED ORDER — LAMOTRIGINE 100 MG PO TABS
100.0000 mg | ORAL_TABLET | Freq: Every day | ORAL | 2 refills | Status: DC
  Filled 2022-04-13: qty 30, 30d supply, fill #0
  Filled 2022-06-09 (×2): qty 30, 30d supply, fill #1
  Filled 2022-07-21 (×2): qty 30, 30d supply, fill #2

## 2022-04-13 NOTE — Progress Notes (Cosign Needed Addendum)
BH MD/PA/NP OP Progress Note  04/13/2022 2:18 PM Danielle Barker  MRN:  [id number]  Chief Complaint:  Chief Complaint  Patient presents with   Follow-up   HPI:   Danielle Barker is a 46 year old female with a past psychiatric history significant for generalized anxiety disorder with panic attacks and bipolar 2 disorder who presents to [facility] for follow-up and medication management.  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  She reports that she gets overwhelmed and anxious sometimes because of work pressure.  She reports that her current medications are working well to stabilize her mood.  She reports that she does take 1 full tablet of Klonopin daily but still gets panic attacks and feels anxious  at work and sometimes unable to focus.  She wants to try hydroxyzine to help with anxiety.  She has tried BuSpar in the past which did not work.  Patient is alert and oriented x4, calm, cooperative, and fully engaged in conversation during the encounter.  Patient denies suicidal or homicidal ideation.  She further denies auditory or visual hallucinations and does not appear to be responding to internal/external stimuli.  Patient reports that she is able to get about 8 hours of sleep but gets vivid dreams. She reports that she had been in bad relationships in the past.  She denies any flashbacks but reports hypervigilance.  She reports that high dose of Lamictal was making her feel tired so she started taking half tablet (75 mg) of Lamictal.  She reports that she was on 100 mg in the past and did well on that.  She wants to decrease Lamictal from 150 mg to 100 mg daily.  Patient drinks alcohol occasionally but denies any illicit  drug use. She lives with her mom who also has depression.    Discussed benzodiazepines resistance, dependence and abuse potential.  Discussed decreasing  Klonopin to 0.5 mg twice daily as needed for anxiety.  Discussed decreasing the dose of Lexapro to recommended dose of 20 mg and starting hydroxyzine for anxiety.  Patient agrees with the plan. Visit Diagnosis:    ICD-10-CM   1. Bipolar 2 disorder (HCC)  F31.81 escitalopram (LEXAPRO) 20 MG tablet    lamoTRIgine (LAMICTAL) 100 MG tablet    ARIPiprazole (ABILIFY) 30 MG tablet    2. Generalized anxiety disorder with panic attacks  F41.1 escitalopram (LEXAPRO) 20 MG tablet   F41.0 hydrOXYzine (ATARAX) 25 MG tablet    clonazePAM (KLONOPIN) 1 MG tablet      Past Psychiatric History:  Insomnia Generalized anxiety disorder Bipolar 2 disorder Denies SA.  Hospitalized psychiatrically 3 times most recently 10 years ago in [place] Past Medical History:  Past Medical History:  Diagnosis Date   Diabetes mellitus without complication ([place])    High cholesterol    High triglycerides    HSV (herpes simplex virus) infection    Mood disorder (HCC)     Past Surgical History:  Procedure Laterality Date   APPENDECTOMY     CHOLECYSTECTOMY     SHOULDER SURGERY Left    TONSILLECTOMY      Family Psychiatric History:  Aunt (maternal) - schizophrenic Mother - unipolar/severe depression Father - Alcohol abuse, anxiety, possible bipolar but has not been diagnosed  Biological Brother - anxious, paranoid Biological Sister - Anxiety, recovering alcoholic (sober for 4 years)  Family History:  Family History  Problem Relation Age of Onset   Cancer Mother    Heart failure Mother    Cancer Father     Social History:  Social History   Socioeconomic History   Marital status: Divorced    Spouse name: Not on file   Number of children: Not on file   Years of education: Not on file   Highest education level: Not on file  Occupational History   Not on  file  Tobacco Use   Smoking status: Never   Smokeless tobacco: Never  Vaping Use   Vaping Use: Never used  Substance and Sexual Activity   Alcohol use: Yes    Comment: rarely   Drug use: Never   Sexual activity: Yes  Other Topics Concern   Not on file  Social History Narrative   Not on file   Social Determinants of Health   Financial Resource Strain: Not on file  Food Insecurity: Not on file  Transportation Needs: Not on file  Physical Activity: Not on file  Stress: Not on file  Social Connections: Not on file    Allergies:  Allergies  Allergen Reactions   Dilaudid [Hydromorphone]     "Makes me feel real stoned out" per pt    Compazine [Prochlorperazine] Palpitations    Tachycardia   Reglan [Metoclopramide] Palpitations    Tachycardia    Zofran [Ondansetron] Palpitations    Tachycardia    Metabolic Disorder Labs: Lab Results  Component Value Date   HGBA1C 8.1 (A) 04/01/2022   No results found for: "PROLACTIN" Lab Results  Component Value Date   CHOL 147 01/01/2022   TRIG 143 01/01/2022   HDL 39 (L) 01/01/2022   CHOLHDL 3.8 01/01/2022   LDLCALC 83 01/01/2022   Lab Results  Component Value Date   TSH 3.727 03/05/2021    Therapeutic Level Labs: No results found for: "LITHIUM" No results found for: "VALPROATE" No results found for: "CBMZ"  Current Medications: Current Outpatient Medications  Medication Sig Dispense Refill   hydrOXYzine (ATARAX) 25 MG tablet Take 1 tablet (25 mg total) by mouth 3 (three) times daily as needed. 30 tablet 2   ARIPiprazole (ABILIFY) 30 MG tablet Take 1 tablet (30 mg total) by mouth daily. 30 tablet 2   Blood Glucose Monitoring Suppl (TRUE METRIX METER) w/Device KIT use kit to check blood glucose 3 three times daily before meals. 1 kit 0   clonazePAM (KLONOPIN) 1 MG tablet Take 0.5 tablets (0.5 mg total) by mouth 2 (two) times daily as needed for anxiety. 30 tablet 0   escitalopram (LEXAPRO) 20 MG tablet Take 1 tablet  (20 mg total) by mouth daily. 30 tablet 2   fenofibrate (TRICOR) 48 MG tablet Take 1 tablet (48 mg total) by mouth daily. 90 tablet 2   gabapentin (NEURONTIN) 300 MG capsule Take 2 capsules (600 mg total) by mouth 3 (three) times daily. 180 capsule 3   glucose blood (TRUE METRIX BLOOD GLUCOSE TEST) test strip Use 1 test strip to check blood glucose 3 times daily 100 each 12   hydrochlorothiazide (HYDRODIURIL) 12.5 MG tablet Take 1 tablet (12.5 mg total) by mouth daily. 90 tablet 1   insulin aspart (NOVOLOG FLEXPEN) 100 UNIT/ML FlexPen Inject 0-12 Units into the skin 3 (three) times daily with meals. As per sliding scale 15 mL 11   insulin glargine (LANTUS) 100 UNIT/ML  injection Inject 0.43 mLs (43 Units total) into the skin 2 (two) times daily. 20 mL 3   Insulin Syringe-Needle U-100 (TRUEPLUS INSULIN SYRINGE) 31G X 5/16" 1 ML MISC Use to inject Lantus twice a day. 100 each 3   lamoTRIgine (LAMICTAL) 100 MG tablet Take 1 tablet (100 mg total) by mouth daily. 30 tablet 2   meclizine (ANTIVERT) 25 MG tablet Take 1 tablet (25 mg total) by mouth 3 (three) times daily as needed for dizziness. 30 tablet 0   metFORMIN (GLUCOPHAGE-XR) 500 MG 24 hr tablet Take one daily with food for one week then two daily with food (Patient taking differently: Take 500 mg by mouth 2 (two) times daily with a meal.) 180 tablet 1   rosuvastatin (CRESTOR) 20 MG tablet Take 1 tablet (20 mg total) by mouth daily. 90 tablet 1   Semaglutide, 2 MG/DOSE, 8 MG/3ML SOPN Inject 2 mg as directed once a week. 9 mL 2   TRUEplus Lancets 28G MISC use 1 lancet to check blood glucose 3 times daily before meals. 100 each 12   valACYclovir (VALTREX) 500 MG tablet Take 1 tablet (500 mg total) by mouth daily for prophylaxis. If out break occurs take 1 tablet twice a day x 5 days as needed for outbreak 120 tablet 1   No current facility-administered medications for this visit.     Musculoskeletal: Strength & Muscle Tone: within normal  limits Gait & Station: normal Patient leans: N/A  Psychiatric Specialty Exam: Review of Systems  Psychiatric/Behavioral:  Negative for decreased concentration, dysphoric mood, hallucinations, self-injury, sleep disturbance and suicidal ideas. The patient is nervous/anxious. The patient is not hyperactive.     Blood pressure 124/82, pulse (!) 105, SpO2 100 %.There is no height or weight on file to calculate BMI.  General Appearance: Fairly Groomed  Eye Contact:  Good  Speech:  Clear and Coherent and Normal Rate  Volume:  Normal  Mood:  Euthymic  Affect:  Congruent  Thought Process:  Coherent, Goal Directed, and Descriptions of Associations: Intact  Orientation:  Full (Time, Place, and Person)  Thought Content: WDL   Suicidal Thoughts:  No  Homicidal Thoughts:  No  Memory:  Immediate;   Good Recent;   Good Remote;   Good  Judgement:  Good  Insight:  Good  Psychomotor Activity:  Normal  Concentration:  Concentration: Good and Attention Span: Good  Recall:  Good  Fund of Knowledge: Good  Language: Good  Akathisia:  No  Handed:  Right  AIMS (if indicated): not done  Assets:  Communication Skills Desire for Improvement Financial Resources/Insurance Housing Social Support Transportation Vocational/Educational  ADL's:  Intact  Cognition: WNL  Sleep:  Good   Screenings: GAD-7    Physiological scientist Office Visit from 04/01/2022 in [place] Office Visit from 01/01/2022 in [place] Office Visit from 12/12/2021 in [facility] Office Visit from 08/26/2021 in [facility] Office Visit from 07/15/2021 in [facility]  Total GAD-7 Score _0 PHQ2-9    [place] Office Visit from 04/01/2022 in [place] Office Visit from 01/01/2022 in [place] Office Visit from  12/12/2021 in [facility] Office Visit from 12/11/2021 in [facility] Office Visit from 08/26/2021 in [place]  PHQ-2 Total Score  0 0 0 0 2  PHQ-9 Total Score 0 -- -- -- 6      Flowsheet Row ED from 03/28/2022 in [place] [facility] from 03/18/2022 in [place] ED from 02/04/2022 in [facility]  C-SSRS RISK CATEGORY No Risk No Risk No Risk        Assessment and Plan:   Danielle Barker is a 46 year old female with a past psychiatric history significant for generalized anxiety disorder with panic attacks and bipolar 2 disorder who presents to [facility] for follow-up and medication refill.  Patient reports feeling tired due to high dose of Lamictal.  She has been taking half tablet (75 mg)of  Lamictal.   Will decrease Lamictal due to tiredness and taper Klonopin further.  Will decrease Lexapro as pt is on higher than recommended dose and add hydroxyzine to help with anxiety.  Per PDMP, patient last filled Klonopin 30-day supply (60 tablets) on 02/18/2022.  Patient's medications to be prescribed to pharmacy of choice.  1. Bipolar 2 disorder (HCC)  - ARIPiprazole (ABILIFY) 30 MG tablet; Take 1 tablet (30 mg total) by mouth daily.  Dispense: 30 tablet; Refill: 2.  EKG on 03/16/2022-QTc 441.  HbA1c 7.5, lipid panel WNL. -Decrease lamoTRIgine to (LAMICTAL) 100 MG tablet; Take 1 tablet (100 mg total) by mouth daily.  Dispense:30 tablet; Refill: 2 -Decrease escitalopram (LEXAPRO) to 20 MG daily; Take 1 tablets (20 mg total) by mouth daily.  Dispense: 30 tablet; Refill: 2  2. Generalized anxiety disorder with panic attacks  -Decrease escitalopram (LEXAPRO) 20 MG tablet; Take 1 tablets (20 mg total) by mouth daily.  Dispense: 30 tablet; Refill: 2 -Decrease clonazepam 0.5 mg twice daily as  needed for anxiety.  -Start hydroxyzine 25 mg nightly for anxiety. 30-day prescription with 2 refills sent to patient's pharmacy  Patient to follow up in 8 weeks.  Collaboration of Care: Collaboration of Care: Dr. Sande Rives  Patient/Guardian was advised Release of Information must be obtained prior to any record release in order to collaborate their care with an outside provider. Patient/Guardian was advised if they have not already done so to contact the registration department to sign all necessary forms in order for [place] to release information regarding their care.   Consent: Patient/Guardian gives verbal consent for treatment and assignment of benefits for services provided during this visit. Patient/Guardian expressed understanding and agreed to proceed.   Armando Reichert, MD 04/13/2022, 2:18 PM

## 2022-04-14 ENCOUNTER — Emergency Department (HOSPITAL_BASED_OUTPATIENT_CLINIC_OR_DEPARTMENT_OTHER): Payer: Self-pay | Admitting: Radiology

## 2022-04-14 ENCOUNTER — Other Ambulatory Visit: Payer: Self-pay

## 2022-04-14 ENCOUNTER — Encounter (HOSPITAL_BASED_OUTPATIENT_CLINIC_OR_DEPARTMENT_OTHER): Payer: Self-pay

## 2022-04-14 ENCOUNTER — Emergency Department (HOSPITAL_BASED_OUTPATIENT_CLINIC_OR_DEPARTMENT_OTHER)
Admission: EM | Admit: 2022-04-14 | Discharge: 2022-04-14 | Discharge status: Against Medical Advice | Payer: Self-pay | Attending: Emergency Medicine | Admitting: Emergency Medicine

## 2022-04-14 DIAGNOSIS — Z5321 Procedure and treatment not carried out due to patient leaving prior to being seen by health care provider: Secondary | ICD-10-CM | POA: Insufficient documentation

## 2022-04-14 DIAGNOSIS — Z1152 Encounter for screening for COVID-19: Secondary | ICD-10-CM | POA: Insufficient documentation

## 2022-04-14 DIAGNOSIS — R6883 Chills (without fever): Secondary | ICD-10-CM | POA: Insufficient documentation

## 2022-04-14 DIAGNOSIS — R0989 Other specified symptoms and signs involving the circulatory and respiratory systems: Secondary | ICD-10-CM | POA: Insufficient documentation

## 2022-04-14 DIAGNOSIS — R059 Cough, unspecified: Secondary | ICD-10-CM | POA: Insufficient documentation

## 2022-04-14 LAB — RESP PANEL BY RT-PCR (RSV, FLU A&B, COVID)  RVPGX2
Influenza A by PCR: NEGATIVE
Influenza B by PCR: NEGATIVE
Resp Syncytial Virus by PCR: NEGATIVE
SARS Coronavirus 2 by RT PCR: NEGATIVE

## 2022-04-14 NOTE — ED Triage Notes (Signed)
Cough, runny nose, chills for the last 24 hours.

## 2022-04-15 ENCOUNTER — Other Ambulatory Visit: Payer: Self-pay

## 2022-04-17 ENCOUNTER — Emergency Department (HOSPITAL_BASED_OUTPATIENT_CLINIC_OR_DEPARTMENT_OTHER)
Admission: EM | Admit: 2022-04-17 | Discharge: 2022-04-17 | Disposition: A | Discharge status: Home / Self Care | Payer: Self-pay | Attending: Emergency Medicine | Admitting: Emergency Medicine

## 2022-04-17 ENCOUNTER — Other Ambulatory Visit: Payer: Self-pay

## 2022-04-17 DIAGNOSIS — R52 Pain, unspecified: Secondary | ICD-10-CM | POA: Insufficient documentation

## 2022-04-17 DIAGNOSIS — J209 Acute bronchitis, unspecified: Secondary | ICD-10-CM

## 2022-04-17 DIAGNOSIS — Z79899 Other long term (current) drug therapy: Secondary | ICD-10-CM | POA: Insufficient documentation

## 2022-04-17 DIAGNOSIS — Z20822 Contact with and (suspected) exposure to covid-19: Secondary | ICD-10-CM | POA: Insufficient documentation

## 2022-04-17 DIAGNOSIS — Z7984 Long term (current) use of oral hypoglycemic drugs: Secondary | ICD-10-CM | POA: Insufficient documentation

## 2022-04-17 DIAGNOSIS — R059 Cough, unspecified: Secondary | ICD-10-CM | POA: Insufficient documentation

## 2022-04-17 DIAGNOSIS — Z794 Long term (current) use of insulin: Secondary | ICD-10-CM | POA: Insufficient documentation

## 2022-04-17 LAB — RESP PANEL BY RT-PCR (RSV, FLU A&B, COVID)  RVPGX2
Influenza A by PCR: NEGATIVE
Influenza B by PCR: NEGATIVE
Resp Syncytial Virus by PCR: NEGATIVE
SARS Coronavirus 2 by RT PCR: NEGATIVE

## 2022-04-17 MED ORDER — AZITHROMYCIN 250 MG PO TABS
250.0000 mg | ORAL_TABLET | Freq: Every day | ORAL | 0 refills | Status: AC
  Filled 2022-04-17 (×2): qty 4, 4d supply, fill #0

## 2022-04-17 MED ORDER — AZITHROMYCIN 250 MG PO TABS
500.0000 mg | ORAL_TABLET | Freq: Once | ORAL | Status: AC
  Administered 2022-04-17: 500 mg via ORAL
  Filled 2022-04-17: qty 2

## 2022-04-17 MED ORDER — GUAIFENESIN-CODEINE 100-10 MG/5ML PO SOLN
10.0000 mL | Freq: Four times a day (QID) | ORAL | 0 refills | Status: AC | PRN
  Filled 2022-04-17 (×2): qty 120, 3d supply, fill #0

## 2022-04-17 NOTE — Discharge Instructions (Signed)
Begin taking Zithromax as prescribed.  Begin taking Robitussin with codeine as prescribed as needed for cough.  Primarily take this medication at night as it will make you drowsy.  Follow-up with primary doctor if symptoms or not improving in the next week.

## 2022-04-17 NOTE — ED Provider Notes (Signed)
[facility] DEPT Provider Note   CSN: [id number] Arrival date & time: 04/17/22  0344     History  Chief Complaint  Patient presents with   Cough    Danielle Barker is a 46 y.o. female.  Patient is a 46 year old female with past medical history of diabetes, bipolar.  Patient presenting today with a 1 week history of chest congestion, cough, body aches, and feeling generally unwell.  Cough has been productive of a brown sputum.  She was here several days ago, but left prior to being seen due to prolonged wait time.  She has been trying over-the-counter medications with little relief.  The history is provided by the patient.       Home Medications Prior to Admission medications   Medication Sig Start Date End Date Taking? Authorizing Provider  ARIPiprazole (ABILIFY) 30 MG tablet Take 1 tablet (30 mg total) by mouth daily. 04/13/22   Armando Reichert, MD  Blood Glucose Monitoring Suppl (TRUE METRIX METER) w/Device KIT use kit to check blood glucose 3 three times daily before meals. 03/31/21   Charlott Rakes, MD  clonazePAM (KLONOPIN) 1 MG tablet Take 0.5 tablets (0.5 mg total) by mouth 2 (two) times daily as needed for anxiety. 04/13/22   Armando Reichert, MD  escitalopram (LEXAPRO) 20 MG tablet Take 1 tablet (20 mg total) by mouth daily. 04/13/22   Armando Reichert, MD  fenofibrate (TRICOR) 48 MG tablet Take 1 tablet (48 mg total) by mouth daily. 01/01/22   Argentina Donovan, PA-C  gabapentin (NEURONTIN) 300 MG capsule Take 2 capsules (600 mg total) by mouth 3 (three) times daily. 01/01/22 03/19/22  Argentina Donovan, PA-C  glucose blood (TRUE METRIX BLOOD GLUCOSE TEST) test strip Use 1 test strip to check blood glucose 3 times daily 03/31/21   Charlott Rakes, MD  hydrochlorothiazide (HYDRODIURIL) 12.5 MG tablet Take 1 tablet (12.5 mg total) by mouth daily. 04/01/22   Charlott Rakes, MD  hydrOXYzine (ATARAX) 25 MG tablet Take 1 tablet (25 mg total) by mouth 3 (three)  times daily as needed. 04/13/22   Armando Reichert, MD  insulin aspart (NOVOLOG FLEXPEN) 100 UNIT/ML FlexPen Inject 0-12 Units into the skin 3 (three) times daily with meals. As per sliding scale 01/01/22   Argentina Donovan, PA-C  insulin glargine (LANTUS) 100 UNIT/ML injection Inject 0.43 mLs (43 Units total) into the skin 2 (two) times daily. 04/01/22   Charlott Rakes, MD  Insulin Syringe-Needle U-100 (TRUEPLUS INSULIN SYRINGE) 31G X 5/16" 1 ML MISC Use to inject Lantus twice a day. 01/01/22   Argentina Donovan, PA-C  lamoTRIgine (LAMICTAL) 100 MG tablet Take 1 tablet (100 mg total) by mouth daily. 04/13/22   Armando Reichert, MD  meclizine (ANTIVERT) 25 MG tablet Take 1 tablet (25 mg total) by mouth 3 (three) times daily as needed for dizziness. 02/04/22   Deno Etienne, DO  metFORMIN (GLUCOPHAGE-XR) 500 MG 24 hr tablet Take one daily with food for one week then two daily with food Patient taking differently: Take 500 mg by mouth 2 (two) times daily with a meal. 01/01/22   McClung, Dionne Bucy, PA-C  rosuvastatin (CRESTOR) 20 MG tablet Take 1 tablet (20 mg total) by mouth daily. 01/01/22   Argentina Donovan, PA-C  Semaglutide, 2 MG/DOSE, 8 MG/3ML SOPN Inject 2 mg as directed once a week. 01/01/22   Argentina Donovan, PA-C  TRUEplus Lancets 28G MISC use 1 lancet to check blood glucose 3 times daily before meals.  03/31/21   Charlott Rakes, MD  valACYclovir (VALTREX) 500 MG tablet Take 1 tablet (500 mg total) by mouth daily for prophylaxis. If out break occurs take 1 tablet twice a day x 5 days as needed for outbreak 01/01/22   Argentina Donovan, PA-C  citalopram (CELEXA) 40 MG tablet Take 40 mg by mouth daily.  11/25/20  [provider]      Allergies    Dilaudid [hydromorphone], Compazine [prochlorperazine], Reglan [metoclopramide], and Zofran [ondansetron]    Review of Systems   Review of Systems  All other systems reviewed and are negative.   Physical Exam Updated Vital Signs BP 124/89 (BP Location:  Right Arm)   Pulse 92   Temp 97.7 F (36.5 C) (Oral)   Resp 18   Ht _0  (1.651 m)   Wt 89.8 kg   LMP 03/27/2022   SpO2 97%   BMI 32.95 kg/m  Physical Exam Vitals and nursing note reviewed.  Constitutional:      General: She is not in acute distress.    Appearance: She is well-developed. She is not diaphoretic.  HENT:     Head: Normocephalic and atraumatic.  Cardiovascular:     Rate and Rhythm: Normal rate and regular rhythm.     Heart sounds: No murmur heard.    No friction rub. No gallop.  Pulmonary:     Effort: Pulmonary effort is normal. No respiratory distress.     Breath sounds: Normal breath sounds. No wheezing.  Abdominal:     General: Bowel sounds are normal. There is no distension.     Palpations: Abdomen is soft.     Tenderness: There is no abdominal tenderness.  Musculoskeletal:        General: Normal range of motion.     Cervical back: Normal range of motion and neck supple.  Skin:    General: Skin is warm and dry.  Neurological:     General: No focal deficit present.     Mental Status: She is alert and oriented to person, place, and time.     ED Results / Procedures / Treatments   Labs (all labs ordered are listed, but only abnormal results are displayed) Labs Reviewed  RESP PANEL BY RT-PCR (RSV, FLU A&B, COVID)  [id number]    EKG None  Radiology No results found.  Procedures Procedures    Medications Ordered in ED Medications - No data to display  ED Course/ Medical Decision Making/ A&P  Patient presenting with a 1 week history of cough, congestion, and URI symptoms.  COVID/flu/RSV tests are negative.  Chest x-ray performed 3 days ago was also unremarkable.  She Reports worsening symptoms over the past week ago has cough productive of brown sputum.  Patient will be treated for bronchitis with Zithromax.  She will also be given Robitussin with codeine for cough.  To follow-up as needed.  Final Clinical Impression(s) / ED Diagnoses Final  diagnoses:  None    Rx / DC Orders ED Discharge Orders     None         Veryl Speak, MD 04/17/22 [phone]

## 2022-04-17 NOTE — ED Triage Notes (Signed)
Pt. Danielle Barker, ambulatory, cc of cough, runny nose, eyes watering, states she was seen at this facility prior but did not stay and left AMA. States swabs came back -. States she has taken everything possible OTC but nothing has helped. Has had cough and flu like symptoms x4 days.

## 2022-04-21 ENCOUNTER — Telehealth: Payer: Self-pay | Admitting: Family Medicine

## 2022-04-21 NOTE — Telephone Encounter (Signed)
Patient states she is at work and unable to talk.   Transition Care Management Unsuccessful Follow-up Telephone Call  Date of discharge and from where:  04/17/22 [facility]  Attempts:  1st Attempt  Reason for unsuccessful TCM follow-up call:  Spoke with patient but she declined to answer any questions at this time.

## 2022-05-13 ENCOUNTER — Other Ambulatory Visit: Payer: Self-pay

## 2022-05-19 ENCOUNTER — Encounter (HOSPITAL_BASED_OUTPATIENT_CLINIC_OR_DEPARTMENT_OTHER): Payer: Self-pay | Admitting: Emergency Medicine

## 2022-05-19 ENCOUNTER — Emergency Department (HOSPITAL_BASED_OUTPATIENT_CLINIC_OR_DEPARTMENT_OTHER)
Admission: EM | Admit: 2022-05-19 | Discharge: 2022-05-19 | Disposition: A | Discharge status: Home / Self Care | Payer: Self-pay | Attending: Emergency Medicine | Admitting: Emergency Medicine

## 2022-05-19 ENCOUNTER — Other Ambulatory Visit: Payer: Self-pay

## 2022-05-19 ENCOUNTER — Emergency Department (HOSPITAL_BASED_OUTPATIENT_CLINIC_OR_DEPARTMENT_OTHER): Payer: Self-pay

## 2022-05-19 DIAGNOSIS — E119 Type 2 diabetes mellitus without complications: Secondary | ICD-10-CM | POA: Insufficient documentation

## 2022-05-19 DIAGNOSIS — Z7984 Long term (current) use of oral hypoglycemic drugs: Secondary | ICD-10-CM | POA: Insufficient documentation

## 2022-05-19 DIAGNOSIS — R1031 Right lower quadrant pain: Secondary | ICD-10-CM

## 2022-05-19 DIAGNOSIS — K859 Acute pancreatitis without necrosis or infection, unspecified: Secondary | ICD-10-CM

## 2022-05-19 DIAGNOSIS — Z794 Long term (current) use of insulin: Secondary | ICD-10-CM | POA: Insufficient documentation

## 2022-05-19 DIAGNOSIS — R748 Abnormal levels of other serum enzymes: Secondary | ICD-10-CM | POA: Insufficient documentation

## 2022-05-19 LAB — LIPASE, BLOOD: Lipase: 112 U/L — ABNORMAL HIGH (ref 11–51)

## 2022-05-19 LAB — URINALYSIS, ROUTINE W REFLEX MICROSCOPIC
Bacteria, UA: NONE SEEN
Bilirubin Urine: NEGATIVE
Glucose, UA: >1000 mg/dL — AB
Hgb urine dipstick: NEGATIVE
Ketones, ur: NEGATIVE mg/dL
Leukocytes,Ua: NEGATIVE
Nitrite: NEGATIVE
Protein, ur: NEGATIVE mg/dL
Specific Gravity, Urine: 1.037 — ABNORMAL HIGH (ref 1.005–1.030)
pH: 5.5 (ref 5.0–8.0)

## 2022-05-19 LAB — COMPREHENSIVE METABOLIC PANEL
ALT: 23 U/L (ref 0–44)
AST: 15 U/L (ref 15–41)
Albumin: 4.5 g/dL (ref 3.5–5.0)
Alkaline Phosphatase: 69 U/L (ref 38–126)
Anion gap: 6 (ref 5–15)
BUN: 15 mg/dL (ref 6–20)
CO2: 29 mmol/L (ref 22–32)
Calcium: 9.7 mg/dL (ref 8.9–10.3)
Chloride: 100 mmol/L (ref 98–111)
Creatinine, Ser: 0.72 mg/dL (ref 0.44–1.00)
GFR, Estimated: >60 mL/min (ref >60)
Glucose, Bld: 119 mg/dL — ABNORMAL HIGH (ref 70–99)
Potassium: 4.1 mmol/L (ref 3.5–5.1)
Sodium: 135 mmol/L (ref 135–145)
Total Bilirubin: 0.4 mg/dL (ref 0.3–1.2)
Total Protein: 7.6 g/dL (ref 6.5–8.1)

## 2022-05-19 LAB — CBC
HCT: 38.2 % (ref 36.0–46.0)
Hemoglobin: 13 g/dL (ref 12.0–15.0)
MCH: 30.9 pg (ref 26.0–34.0)
MCHC: 34 g/dL (ref 30.0–36.0)
MCV: 90.7 fL (ref 80.0–100.0)
Platelets: 348 10*3/uL (ref 150–400)
RBC: 4.21 MIL/uL (ref 3.87–5.11)
RDW: 12.5 % (ref 11.5–15.5)
WBC: 7.1 10*3/uL (ref 4.0–10.5)
nRBC: 0 % (ref 0.0–0.2)

## 2022-05-19 LAB — CBG MONITORING, ED: Glucose-Capillary: 114 mg/dL — ABNORMAL HIGH (ref 70–99)

## 2022-05-19 LAB — PREGNANCY, URINE: Preg Test, Ur: NEGATIVE

## 2022-05-19 MED ORDER — IOHEXOL 300 MG/ML  SOLN
100.0000 mL | Freq: Once | INTRAMUSCULAR | Status: AC | PRN
  Administered 2022-05-19: 100 mL via INTRAVENOUS

## 2022-05-19 MED ORDER — SODIUM CHLORIDE 0.9 % IV BOLUS
1000.0000 mL | Freq: Once | INTRAVENOUS | Status: AC
  Administered 2022-05-19: 1000 mL via INTRAVENOUS

## 2022-05-19 MED ORDER — KETOROLAC TROMETHAMINE 15 MG/ML IJ SOLN
15.0000 mg | Freq: Once | INTRAMUSCULAR | Status: AC
  Administered 2022-05-19: 15 mg via INTRAVENOUS
  Filled 2022-05-19: qty 1

## 2022-05-19 NOTE — ED Provider Notes (Signed)
Accepted handoff at shift change from [facility]. Please see prior provider note for more detail.   Briefly: Patient is 47 y.o. presents to the ED with right lower quadrant pain that radiates to the right upper quadrant that has been going on for the past day.  She also complains of increased frequency of urination and thirst.  She states she is concerned she may be in DKA.  Patient's sugar was 480 this morning and she took some extra insulin which has improved her blood glucose.  DDX: concern for DKA, HH NS, hyperglycemic emergency, pancreatitis, appendicitis, hepatitis  Plan: Plan at time of handoff is to wait for results of CT abdomen pelvis.  If results are normal, patient can follow-up outpatient with her primary care provider.  Patient was also advised to follow-up with primary care provider regarding Ozempic which may be causing her elevated lipase.  Patient does not wish to have any additional workup done on her epigastric pain.  Results for orders placed or performed during the hospital encounter of 05/19/22  Comprehensive metabolic panel  Result Value Ref Range   Sodium 135 135 - 145 mmol/L   Potassium 4.1 3.5 - 5.1 mmol/L   Chloride 100 98 - 111 mmol/L   CO2 29 22 - 32 mmol/L   Glucose, Bld 119 (H) 70 - 99 mg/dL   BUN 15 6 - 20 mg/dL   Creatinine, Ser 0.72 0.44 - 1.00 mg/dL   Calcium 9.7 8.9 - 10.3 mg/dL   Total Protein 7.6 6.5 - 8.1 g/dL   Albumin 4.5 3.5 - 5.0 g/dL   AST 15 15 - 41 U/L   ALT 23 0 - 44 U/L   Alkaline Phosphatase 69 38 - 126 U/L   Total Bilirubin 0.4 0.3 - 1.2 mg/dL   GFR, Estimated >60 >60 mL/min   Anion gap 6 5 - 15  CBC  Result Value Ref Range   WBC 7.1 4.0 - 10.5 K/uL   RBC 4.21 3.87 - 5.11 MIL/uL   Hemoglobin 13.0 12.0 - 15.0 g/dL   HCT 38.2 36.0 - 46.0 %   MCV 90.7 80.0 - 100.0 fL   MCH 30.9 26.0 - 34.0 pg   MCHC 34.0 30.0 - 36.0 g/dL   RDW 12.5 11.5 - 15.5 %   Platelets 348 150 - 400 K/uL   nRBC 0.0 0.0 - 0.2 %  Lipase, blood  Result  Value Ref Range   Lipase 112 (H) 11 - 51 U/L  Pregnancy, urine  Result Value Ref Range   Preg Test, Ur NEGATIVE NEGATIVE  Urinalysis, Routine w reflex microscopic Urine, Clean Catch  Result Value Ref Range   Color, Urine YELLOW YELLOW   APPearance CLEAR CLEAR   Specific Gravity, Urine 1.037 (H) 1.005 - 1.030   pH 5.5 5.0 - 8.0   Glucose, UA >1,000 (A) NEGATIVE mg/dL   Hgb urine dipstick NEGATIVE NEGATIVE   Bilirubin Urine NEGATIVE NEGATIVE   Ketones, ur NEGATIVE NEGATIVE mg/dL   Protein, ur NEGATIVE NEGATIVE mg/dL   Nitrite NEGATIVE NEGATIVE   Leukocytes,Ua NEGATIVE NEGATIVE   RBC / HPF 0-5 0 - 5 RBC/hpf   WBC, UA 0-5 0 - 5 WBC/hpf   Bacteria, UA NONE SEEN NONE SEEN   Squamous Epithelial / HPF 6-10 0 - 5 /HPF   Mucus PRESENT   CBG monitoring, ED  Result Value Ref Range   Glucose-Capillary 114 (H) 70 - 99 mg/dL   No results found.  Physical Exam  BP  115/77   Pulse 77   Temp 98.3 F (36.8 C) (Oral)   Resp 19   SpO2 100%   Physical Exam Vitals and nursing note reviewed.  Constitutional:      General: She is not in acute distress.    Appearance: Normal appearance. She is not ill-appearing or diaphoretic.  Cardiovascular:     Rate and Rhythm: Normal rate and regular rhythm.     Pulses: Normal pulses.     Heart sounds: Normal heart sounds.  Pulmonary:     Effort: Pulmonary effort is normal.     Breath sounds: Normal breath sounds.  Abdominal:     General: Abdomen is flat.     Palpations: Abdomen is soft.     Tenderness: There is no abdominal tenderness. There is no right CVA tenderness or left CVA tenderness.  Skin:    General: Skin is warm and dry.     Capillary Refill: Capillary refill takes less than 2 seconds.  Neurological:     Mental Status: She is alert. Mental status is at baseline.  Psychiatric:        Mood and Affect: Mood normal.        Behavior: Behavior normal.     Procedures  Procedures  ED Course / MDM    Medical Decision Making Amount  and/or Complexity of Data Reviewed Labs: ordered. Radiology: ordered.  Risk Prescription drug management.   CT results demonstrate perinephric stranding of the right kidney, no other acute intra-abdominal findings.  Patient's urine is not consistent with pyelonephritis.  Do not suspect pyelonephritis at this time and I do not feel that antibiotics are necessary.  Patient does not wish to have further workup regarding her epigastric pain and elevated lipase and is in agreement with outpatient follow-up with her primary care provider as this may be adverse effect of Ozempic.  Patient has been able to eat and drink normally without increased pain, nausea, or vomiting.    The patient has been appropriately medically screened and/or stabilized in the ED. I have low suspicion for any other emergent medical condition which would require further screening, evaluation or treatment in the ED or require inpatient management. At time of discharge the patient is hemodynamically stable and in no acute distress. I have discussed work-up results and diagnosis with patient and answered all questions. Patient is agreeable with discharge plan. We discussed strict return precautions for returning to the emergency department and they verbalized understanding.   Work note provided for patient.          Pat Kocher, [place] 05/19/22 2001    Regan Lemming, MD 05/20/22 [phone]

## 2022-05-19 NOTE — ED Notes (Signed)
Pt has now returned from [facility] via stretcher escorted by [employer]

## 2022-05-19 NOTE — ED Notes (Signed)
Pt agreeable with d/c plan as discussed by provider- this nurse has verbally reinforced d/c instructions and provided pt with written copy.  Pt acknowledges verbal understanding and denies any addl questions, concerns, needs- pt ambulatory independently at d/c with steady gait - no acute changes/distress noted.   

## 2022-05-19 NOTE — ED Triage Notes (Signed)
Pt is diabetic, this am woke up and she was extremely thirsty, voiding a lot, lots of flank pain to right side and lower back pain on both sides. Cbg in the 400's this am, pt took 100 units  lantus and 20 of novolog,usually takes 86 units.pt wants to make sure not in DKA

## 2022-05-19 NOTE — Discharge Instructions (Addendum)
Please follow-up with your primary care doctor, and discussed your elevated lipase with them, and if you need to stop Ozempic as this may be contributing to your pancreatitis.  Continue to monitor your blood sugar closely at home.   If you have worsening abdominal pain intractable nausea or vomiting please return to the ER.

## 2022-05-19 NOTE — ED Provider Notes (Signed)
[facility] Provider Note   CSN: [id number] Arrival date & time: 05/19/22  1302     History  Chief Complaint  Patient presents with   Flank Pain    LAURI PURDUM is a 47 y.o. female, history of type 2 diabetes, ovarian cyst, who presents to the ED secondary to right lower quadrant pain, that radiates to the right upper quadrant for the last day, increased frequency of urination, and thirst.  She states all of her symptoms started today and she is concerned she may be in DKA.  States her sugar was 480 this morning, and she took some extra insulin, and it has gone down since then, but she wants to rule out DKA.  Denies any vomiting, does endorse some nausea.  No fevers, chills, diarrhea.  Notes her glucose is typically 150-200.  Also reports that she has been having some occasional epigastric pain, denies any epigastric pain at this time, and states she would not want any further workup on this.     Home Medications Prior to Admission medications   Medication Sig Start Date End Date Taking? Authorizing Provider  ARIPiprazole (ABILIFY) 30 MG tablet Take 1 tablet (30 mg total) by mouth daily. 04/13/22   Armando Reichert, MD  azithromycin (ZITHROMAX) 250 MG tablet Take 1 tablet (250 mg total) by mouth daily. 04/17/22   Veryl Speak, MD  Blood Glucose Monitoring Suppl (TRUE METRIX METER) w/Device KIT use kit to check blood glucose 3 three times daily before meals. 03/31/21   Charlott Rakes, MD  clonazePAM (KLONOPIN) 1 MG tablet Take 0.5 tablets (0.5 mg total) by mouth 2 (two) times daily as needed for anxiety. 04/13/22   Armando Reichert, MD  escitalopram (LEXAPRO) 20 MG tablet Take 1 tablet (20 mg total) by mouth daily. 04/13/22   Armando Reichert, MD  fenofibrate (TRICOR) 48 MG tablet Take 1 tablet (48 mg total) by mouth daily. 01/01/22   Argentina Donovan, PA-C  gabapentin (NEURONTIN) 300 MG capsule Take 2 capsules (600 mg total) by mouth 3 (three)  times daily. 01/01/22 03/19/22  Argentina Donovan, PA-C  glucose blood (TRUE METRIX BLOOD GLUCOSE TEST) test strip Use 1 test strip to check blood glucose 3 times daily 03/31/21   Charlott Rakes, MD  guaiFENesin-codeine 100-10 MG/5ML syrup Take 10 mLs by mouth every 6 (six) hours as needed for cough. 04/17/22   Veryl Speak, MD  hydrochlorothiazide (HYDRODIURIL) 12.5 MG tablet Take 1 tablet (12.5 mg total) by mouth daily. 04/01/22   Charlott Rakes, MD  hydrOXYzine (ATARAX) 25 MG tablet Take 1 tablet (25 mg total) by mouth 3 (three) times daily as needed. 04/13/22   Armando Reichert, MD  insulin aspart (NOVOLOG FLEXPEN) 100 UNIT/ML FlexPen Inject 0-12 Units into the skin 3 (three) times daily with meals. As per sliding scale 01/01/22   Argentina Donovan, PA-C  insulin glargine (LANTUS) 100 UNIT/ML injection Inject 0.43 mLs (43 Units total) into the skin 2 (two) times daily. 04/01/22   Charlott Rakes, MD  Insulin Syringe-Needle U-100 (TRUEPLUS INSULIN SYRINGE) 31G X 5/16" 1 ML MISC Use to inject Lantus twice a day. 01/01/22   Argentina Donovan, PA-C  lamoTRIgine (LAMICTAL) 100 MG tablet Take 1 tablet (100 mg total) by mouth daily. 04/13/22   Armando Reichert, MD  meclizine (ANTIVERT) 25 MG tablet Take 1 tablet (25 mg total) by mouth 3 (three) times daily as needed for dizziness. 02/04/22   Deno Etienne, DO  metFORMIN ([place])  500 MG 24 hr tablet Take one daily with food for one week then two daily with food Patient taking differently: Take 500 mg by mouth 2 (two) times daily with a meal. 01/01/22   McClung, Marzella Schlein, PA-C  rosuvastatin (CRESTOR) 20 MG tablet Take 1 tablet (20 mg total) by mouth daily. 01/01/22   Anders Simmonds, PA-C  Semaglutide, 2 MG/DOSE, 8 MG/3ML SOPN Inject 2 mg as directed once a week. 01/01/22   Anders Simmonds, PA-C  TRUEplus Lancets 28G MISC use 1 lancet to check blood glucose 3 times daily before meals. 03/31/21   Hoy Register, MD  valACYclovir (VALTREX) 500 MG tablet Take 1 tablet  (500 mg total) by mouth daily for prophylaxis. If out break occurs take 1 tablet twice a day x 5 days as needed for outbreak 01/01/22   Anders Simmonds, PA-C  citalopram (CELEXA) 40 MG tablet Take 40 mg by mouth daily.  11/25/20  [provider]      Allergies    Dilaudid [hydromorphone], Compazine [prochlorperazine], Reglan [metoclopramide], and Zofran [ondansetron]    Review of Systems   Review of Systems  Gastrointestinal:  Positive for abdominal pain and nausea. Negative for diarrhea and vomiting.    Physical Exam Updated Vital Signs BP 115/77   Pulse 77   Temp 98.3 F (36.8 C) (Oral)   Resp 19   SpO2 100%  Physical Exam Vitals and nursing note reviewed.  Constitutional:      General: She is not in acute distress.    Appearance: She is well-developed.  HENT:     Head: Normocephalic and atraumatic.  Eyes:     Conjunctiva/sclera: Conjunctivae normal.  Cardiovascular:     Rate and Rhythm: Normal rate and regular rhythm.     Heart sounds: No murmur heard. Pulmonary:     Effort: Pulmonary effort is normal. No respiratory distress.     Breath sounds: Normal breath sounds.  Abdominal:     Palpations: Abdomen is soft.     Tenderness: There is abdominal tenderness in the right upper quadrant and right lower quadrant. There is guarding.  Musculoskeletal:        General: No swelling.     Cervical back: Neck supple.  Skin:    General: Skin is warm and dry.     Capillary Refill: Capillary refill takes less than 2 seconds.  Neurological:     Mental Status: She is alert.  Psychiatric:        Mood and Affect: Mood normal.     ED Results / Procedures / Treatments   Labs (all labs ordered are listed, but only abnormal results are displayed) Labs Reviewed  COMPREHENSIVE METABOLIC PANEL - Abnormal; Notable for the following components:      Result Value   Glucose, Bld 119 (*)    All other components within normal limits  LIPASE, BLOOD - Abnormal; Notable for the  following components:   Lipase 112 (*)    All other components within normal limits  URINALYSIS, ROUTINE W REFLEX MICROSCOPIC - Abnormal; Notable for the following components:   Specific Gravity, Urine 1.037 (*)    Glucose, UA >1,000 (*)    All other components within normal limits  CBG MONITORING, ED - Abnormal; Notable for the following components:   Glucose-Capillary 114 (*)    All other components within normal limits  CBC  PREGNANCY, URINE    EKG None  Radiology No results found.  Procedures Procedures    Medications  Ordered in ED Medications  ketorolac (TORADOL) 15 MG/ML injection 15 mg (15 mg Intravenous Given 05/19/22 1728)  sodium chloride 0.9 % bolus 1,000 mL (1,000 mLs Intravenous New Bag/Given 05/19/22 1825)  iohexol (OMNIPAQUE) 300 MG/ML solution 100 mL (100 mLs Intravenous Contrast Given 05/19/22 1858)    ED Course/ Medical Decision Making/ A&P                           Medical Decision Making Patient is a 47 year old female, history of diabetes, here for elevated glucose, she she endorses polyuria, polydipsia for the last day, and endorses nausea, right lower quadrant pain, and epigastric pain.  She states the epigastric issue has been going on for some time, and she declines any further workup for this.  She states that the right lower quadrant pain started last couple days.  Obtain a CT abdomen pelvis, CBC, CMP, lipase, and Preg for further evaluation.  She is tender to the right lower quadrant and right upper quadrant thus a CT was obtained.  Amount and/or Complexity of Data Reviewed Labs: ordered.    Details: Elevated lipase of 112, no leukocytosis, no evidence of anion gap, glucose in the low 100s Radiology: ordered. Discussion of management or test interpretation with external provider(s): Patient has no anion gap, and is fairly well-hydrated, and is not hyperglycemic here, she did take her insulin, however secondary to absence of anion gap, will not obtain  a VBG.  CT abdomen pelvis pending at transfer of care to [facility].  Discussed need for patient to follow-up with primary care for pancreatitis that is possibly induced by Ozempic.   Risk Prescription drug management.    Final Clinical Impression(s) / ED Diagnoses Final diagnoses:  Acute pancreatitis, unspecified complication status, unspecified pancreatitis type  RLQ abdominal pain    Rx / DC Orders ED Discharge Orders     None         Osvaldo Shipper, PA 05/19/22 Barnie Mort, MD 05/20/22 [phone]

## 2022-05-19 NOTE — ED Notes (Signed)
Diet gingerale and kind bar given

## 2022-05-19 NOTE — ED Notes (Signed)
[facility] extra blue/dark green/gold/and red top labs.

## 2022-05-19 NOTE — ED Notes (Signed)
Pt awake and alert; GCS 15.  Pt lying on L lateral side at this time.  Report ongoing R flank pain radiating to lower back now rated 4/10 with intermittent associated nausea.  Pain described as dull pressure.  NS bolus resumed to 20G R AC after pt has returned from CT.  Cardiac and pulse ox monitoring maintained.  VSS.  Pt updated on CT results still pending.  Pt denies any addl questions concerns needs at this time.  Will monitor for acute changes and maintain plan of care.   Call bell in reach.

## 2022-05-22 ENCOUNTER — Other Ambulatory Visit: Payer: Self-pay

## 2022-05-25 ENCOUNTER — Emergency Department (HOSPITAL_BASED_OUTPATIENT_CLINIC_OR_DEPARTMENT_OTHER)
Admission: EM | Admit: 2022-05-25 | Discharge: 2022-05-25 | Disposition: A | Discharge status: Home / Self Care | Payer: Self-pay | Attending: Emergency Medicine | Admitting: Emergency Medicine

## 2022-05-25 ENCOUNTER — Other Ambulatory Visit: Payer: Self-pay

## 2022-05-25 ENCOUNTER — Emergency Department (HOSPITAL_COMMUNITY)
Admission: EM | Admit: 2022-05-25 | Discharge: 2022-05-25 | Disposition: A | Discharge status: Home / Self Care | Payer: Self-pay

## 2022-05-25 ENCOUNTER — Encounter (HOSPITAL_BASED_OUTPATIENT_CLINIC_OR_DEPARTMENT_OTHER): Payer: Self-pay | Admitting: Emergency Medicine

## 2022-05-25 ENCOUNTER — Emergency Department (HOSPITAL_BASED_OUTPATIENT_CLINIC_OR_DEPARTMENT_OTHER): Payer: Self-pay

## 2022-05-25 DIAGNOSIS — Z7984 Long term (current) use of oral hypoglycemic drugs: Secondary | ICD-10-CM | POA: Insufficient documentation

## 2022-05-25 DIAGNOSIS — R1013 Epigastric pain: Secondary | ICD-10-CM | POA: Insufficient documentation

## 2022-05-25 DIAGNOSIS — Z794 Long term (current) use of insulin: Secondary | ICD-10-CM | POA: Insufficient documentation

## 2022-05-25 DIAGNOSIS — E119 Type 2 diabetes mellitus without complications: Secondary | ICD-10-CM | POA: Insufficient documentation

## 2022-05-25 LAB — COMPREHENSIVE METABOLIC PANEL
ALT: 19 U/L (ref 0–44)
AST: 17 U/L (ref 15–41)
Albumin: 4.1 g/dL (ref 3.5–5.0)
Alkaline Phosphatase: 69 U/L (ref 38–126)
Anion gap: 7 (ref 5–15)
BUN: 12 mg/dL (ref 6–20)
CO2: 27 mmol/L (ref 22–32)
Calcium: 9.3 mg/dL (ref 8.9–10.3)
Chloride: 105 mmol/L (ref 98–111)
Creatinine, Ser: 0.75 mg/dL (ref 0.44–1.00)
GFR, Estimated: >60 mL/min (ref >60)
Glucose, Bld: 113 mg/dL — ABNORMAL HIGH (ref 70–99)
Potassium: 3.9 mmol/L (ref 3.5–5.1)
Sodium: 139 mmol/L (ref 135–145)
Total Bilirubin: 0.4 mg/dL (ref 0.3–1.2)
Total Protein: 6.8 g/dL (ref 6.5–8.1)

## 2022-05-25 LAB — CBC
HCT: 36 % (ref 36.0–46.0)
Hemoglobin: 12 g/dL (ref 12.0–15.0)
MCH: 30.2 pg (ref 26.0–34.0)
MCHC: 33.3 g/dL (ref 30.0–36.0)
MCV: 90.5 fL (ref 80.0–100.0)
Platelets: 341 10*3/uL (ref 150–400)
RBC: 3.98 MIL/uL (ref 3.87–5.11)
RDW: 12.3 % (ref 11.5–15.5)
WBC: 5.9 10*3/uL (ref 4.0–10.5)
nRBC: 0 % (ref 0.0–0.2)

## 2022-05-25 LAB — LIPASE, BLOOD: Lipase: 16 U/L (ref 11–51)

## 2022-05-25 LAB — HCG, SERUM, QUALITATIVE: Preg, Serum: NEGATIVE

## 2022-05-25 MED ORDER — SUCRALFATE 1 G PO TABS: 1.0000 g | ORAL_TABLET | Freq: Three times a day (TID) | ORAL | 0 refills | Status: AC

## 2022-05-25 MED ORDER — SODIUM CHLORIDE 0.9 % IV BOLUS
1000.0000 mL | Freq: Once | INTRAVENOUS | Status: AC
  Administered 2022-05-25: 1000 mL via INTRAVENOUS

## 2022-05-25 MED ORDER — KETOROLAC TROMETHAMINE 30 MG/ML IJ SOLN
30.0000 mg | Freq: Once | INTRAMUSCULAR | Status: AC
  Administered 2022-05-25: 30 mg via INTRAVENOUS
  Filled 2022-05-25: qty 1

## 2022-05-25 MED ORDER — FENTANYL CITRATE PF 50 MCG/ML IJ SOSY: 50.0000 ug | PREFILLED_SYRINGE | Freq: Once | INTRAMUSCULAR | Status: DC

## 2022-05-25 NOTE — ED Triage Notes (Signed)
Pt arrives to ED with c/o pancreatitis. She reports that today she developed pain in her epigastric region with pain radiating to her right back. She was dx with pancreatitis in ED last week, pain resolved but flared up again today.

## 2022-05-25 NOTE — ED Notes (Signed)
Registration states pt left 

## 2022-05-25 NOTE — ED Provider Notes (Signed)
[facility] Provider Note   CSN: [id number] Arrival date & time: 05/25/22  1423     History  Chief Complaint  Patient presents with   Abdominal Pain    Danielle Barker is a 47 y.o. female.  Patient here with upper abdominal pain on and off for the last few days.  Denies any pain with urination.  Has had her gallbladder removed in the past.  Denies any chest pain or shortness of breath.  No recent surgery or travel.  She is not on estrogen.  History of diabetes, high cholesterol.  Denies any blood in the urine.  The history is provided by the patient.       Home Medications Prior to Admission medications   Medication Sig Start Date End Date Taking? Authorizing Provider  sucralfate (CARAFATE) 1 g tablet Take 1 tablet (1 g total) by mouth 4 (four) times daily -  with meals and at bedtime for 14 days. 05/25/22 06/08/22 Yes Lawrie Tunks, DO  ARIPiprazole (ABILIFY) 30 MG tablet Take 1 tablet (30 mg total) by mouth daily. 04/13/22   Armando Reichert, MD  azithromycin (ZITHROMAX) 250 MG tablet Take 1 tablet (250 mg total) by mouth daily. 04/17/22   Veryl Speak, MD  Blood Glucose Monitoring Suppl (TRUE METRIX METER) w/Device KIT use kit to check blood glucose 3 three times daily before meals. 03/31/21   Charlott Rakes, MD  clonazePAM (KLONOPIN) 1 MG tablet Take 0.5 tablets (0.5 mg total) by mouth 2 (two) times daily as needed for anxiety. 04/13/22   Armando Reichert, MD  escitalopram (LEXAPRO) 20 MG tablet Take 1 tablet (20 mg total) by mouth daily. 04/13/22   Armando Reichert, MD  fenofibrate (TRICOR) 48 MG tablet Take 1 tablet (48 mg total) by mouth daily. 01/01/22   Argentina Donovan, PA-C  gabapentin (NEURONTIN) 300 MG capsule Take 2 capsules (600 mg total) by mouth 3 (three) times daily. 01/01/22 03/19/22  Argentina Donovan, PA-C  glucose blood (TRUE METRIX BLOOD GLUCOSE TEST) test strip Use 1 test strip to check blood glucose 3 times daily 03/31/21    Charlott Rakes, MD  guaiFENesin-codeine 100-10 MG/5ML syrup Take 10 mLs by mouth every 6 (six) hours as needed for cough. 04/17/22   Veryl Speak, MD  hydrochlorothiazide (HYDRODIURIL) 12.5 MG tablet Take 1 tablet (12.5 mg total) by mouth daily. 04/01/22   Charlott Rakes, MD  hydrOXYzine (ATARAX) 25 MG tablet Take 1 tablet (25 mg total) by mouth 3 (three) times daily as needed. 04/13/22   Armando Reichert, MD  insulin aspart (NOVOLOG FLEXPEN) 100 UNIT/ML FlexPen Inject 0-12 Units into the skin 3 (three) times daily with meals. As per sliding scale 01/01/22   Argentina Donovan, PA-C  insulin glargine (LANTUS) 100 UNIT/ML injection Inject 0.43 mLs (43 Units total) into the skin 2 (two) times daily. 04/01/22   Charlott Rakes, MD  Insulin Syringe-Needle U-100 (TRUEPLUS INSULIN SYRINGE) 31G X 5/16" 1 ML MISC Use to inject Lantus twice a day. 01/01/22   Argentina Donovan, PA-C  lamoTRIgine (LAMICTAL) 100 MG tablet Take 1 tablet (100 mg total) by mouth daily. 04/13/22   Armando Reichert, MD  meclizine (ANTIVERT) 25 MG tablet Take 1 tablet (25 mg total) by mouth 3 (three) times daily as needed for dizziness. 02/04/22   Deno Etienne, DO  metFORMIN (GLUCOPHAGE-XR) 500 MG 24 hr tablet Take one daily with food for one week then two daily with food Patient taking differently: Take 500  mg by mouth 2 (two) times daily with a meal. 01/01/22   McClung, Dionne Bucy, PA-C  rosuvastatin (CRESTOR) 20 MG tablet Take 1 tablet (20 mg total) by mouth daily. 01/01/22   Argentina Donovan, PA-C  Semaglutide, 2 MG/DOSE, 8 MG/3ML SOPN Inject 2 mg as directed once a week. 01/01/22   Argentina Donovan, PA-C  TRUEplus Lancets 28G MISC use 1 lancet to check blood glucose 3 times daily before meals. 03/31/21   Charlott Rakes, MD  valACYclovir (VALTREX) 500 MG tablet Take 1 tablet (500 mg total) by mouth daily for prophylaxis. If out break occurs take 1 tablet twice a day x 5 days as needed for outbreak 01/01/22   Argentina Donovan, PA-C  citalopram  (CELEXA) 40 MG tablet Take 40 mg by mouth daily.  11/25/20  [provider]      Allergies    Dilaudid [hydromorphone], Compazine [prochlorperazine], Reglan [metoclopramide], and Zofran [ondansetron]    Review of Systems   Review of Systems  Physical Exam Updated Vital Signs BP 112/73   Pulse 77   Temp 97.8 F (36.6 C)   Resp 16   Ht 5\' 5"  (1.651 m)   Wt 90.3 kg   SpO2 100%   BMI 33.12 kg/m  Physical Exam Vitals and nursing note reviewed.  Constitutional:      General: She is not in acute distress.    Appearance: She is well-developed. She is not ill-appearing.  HENT:     Head: Normocephalic and atraumatic.  Eyes:     Extraocular Movements: Extraocular movements intact.     Conjunctiva/sclera: Conjunctivae normal.     Pupils: Pupils are equal, round, and reactive to light.  Cardiovascular:     Rate and Rhythm: Normal rate and regular rhythm.     Heart sounds: Normal heart sounds. No murmur heard. Pulmonary:     Effort: Pulmonary effort is normal. No respiratory distress.     Breath sounds: Normal breath sounds.  Abdominal:     General: Abdomen is flat.     Palpations: Abdomen is soft.     Tenderness: There is abdominal tenderness in the right upper quadrant and epigastric area. There is no guarding or rebound. Negative signs include Murphy's sign.  Musculoskeletal:        General: No swelling.     Cervical back: Neck supple.  Skin:    General: Skin is warm and dry.     Capillary Refill: Capillary refill takes less than 2 seconds.  Neurological:     Mental Status: She is alert.  Psychiatric:        Mood and Affect: Mood normal.     ED Results / Procedures / Treatments   Labs (all labs ordered are listed, but only abnormal results are displayed) Labs Reviewed  COMPREHENSIVE METABOLIC PANEL - Abnormal; Notable for the following components:      Result Value   Glucose, Bld 113 (*)    All other components within normal limits  LIPASE, BLOOD  CBC   HCG, SERUM, QUALITATIVE    EKG None  Radiology CT ABDOMEN PELVIS WO CONTRAST  Result Date: 05/25/2022 CLINICAL DATA:  Acute abdominal pain. EXAM: CT ABDOMEN AND PELVIS WITHOUT CONTRAST TECHNIQUE: Multidetector CT imaging of the abdomen and pelvis was performed following the standard protocol without IV contrast. RADIATION DOSE REDUCTION: This exam was performed according to the departmental dose-optimization program which includes automated exposure control, adjustment of the mA and/or kV according to patient size and/or use  of iterative reconstruction technique. COMPARISON:  CT abdomen and pelvis 05/19/2022 FINDINGS: Lower chest: No acute abnormality. Hepatobiliary: No focal liver abnormality is seen. Status post cholecystectomy. No biliary dilatation. Pancreas: Unremarkable. No pancreatic ductal dilatation or surrounding inflammatory changes. Spleen: Normal in size without focal abnormality. Adrenals/Urinary Tract: Adrenal glands are unremarkable. Kidneys are normal, without renal calculi, focal lesion, or hydronephrosis. Bladder is unremarkable. Stomach/Bowel: Stomach is within normal limits. Appendix is not seen. No evidence of bowel wall thickening, distention, or inflammatory changes. Vascular/Lymphatic: No significant vascular findings are present. No enlarged abdominal or pelvic lymph nodes. Reproductive: Uterus and bilateral adnexa are unremarkable. Other: No abdominal wall hernia or abnormality. No abdominopelvic ascites. Musculoskeletal: No acute or significant osseous findings. IMPRESSION: 1. Status post cholecystectomy. 2. No evidence of nephrolithiasis or hydronephrosis. 3. No evidence of acute abdominal/pelvic process. Electronically Signed   By: Darliss Cheney M.D.   On: 05/25/2022 16:15    Procedures Procedures    Medications Ordered in ED Medications  sodium chloride 0.9 % bolus 1,000 mL (0 mLs Intravenous Stopped 05/25/22 1724)  ketorolac (TORADOL) 30 MG/ML injection 30 mg (30 mg  Intravenous Given 05/25/22 1612)    ED Course/ Medical Decision Making/ A&P                             Medical Decision Making Amount and/or Complexity of Data Reviewed Labs: ordered.  Risk Prescription drug management.   Danielle Barker is here with upper abdominal pain on and off for the last few days.  Normal vitals.  No fever.  Gallbladder removed in the past.  History of, high cholesterol, mood disorder.  She was seen last week and thought she was diagnosed with pancreatitis but per my review interpretation of workup she had unremarkable workup.  There is a question of acute pyelonephritis on her CT scan but urinalysis was negative for infection.  Overall she appears well.  Differential diagnosis likely gastroparesis/gastritis versus less likely bowel obstruction, kidney infection, pancreatitis.  Will get CBC, CMP, lipase, urinalysis, CT scan abdomen pelvis.  Will give IV Toradol, IV fluids and reevaluate.  Lab work is unremarkable.  She is been able to eat without any issues.  Did not want to give urine sample which I think is reasonable as she is not symptomatic.  CT scan shows no evidence of pyelonephritis or other acute process.  I reviewed interpreted labs.  Suspect may be some mild gastritis.  Discharged in good condition.  Understands return precautions.  Will prescribe Carafate.  This chart was dictated using voice recognition software.  Despite best efforts to proofread,  errors can occur which can change the documentation meaning.         Final Clinical Impression(s) / ED Diagnoses Final diagnoses:  Epigastric pain    Rx / DC Orders ED Discharge Orders          Ordered    sucralfate (CARAFATE) 1 g tablet  3 times daily with meals & bedtime        05/25/22 1728              Virgina Norfolk, DO 05/25/22 1729

## 2022-05-28 ENCOUNTER — Other Ambulatory Visit: Payer: Self-pay

## 2022-06-01 ENCOUNTER — Other Ambulatory Visit: Payer: Self-pay

## 2022-06-04 ENCOUNTER — Other Ambulatory Visit: Payer: Self-pay

## 2022-06-08 ENCOUNTER — Encounter (HOSPITAL_COMMUNITY): Payer: Self-pay | Admitting: Psychiatry

## 2022-06-08 ENCOUNTER — Other Ambulatory Visit: Payer: Self-pay

## 2022-06-08 ENCOUNTER — Encounter (HOSPITAL_COMMUNITY): Payer: Self-pay

## 2022-06-09 ENCOUNTER — Other Ambulatory Visit: Payer: Self-pay | Admitting: Family Medicine

## 2022-06-09 ENCOUNTER — Other Ambulatory Visit: Payer: Self-pay

## 2022-06-09 DIAGNOSIS — E1169 Type 2 diabetes mellitus with other specified complication: Secondary | ICD-10-CM

## 2022-06-09 DIAGNOSIS — Z794 Long term (current) use of insulin: Secondary | ICD-10-CM

## 2022-06-09 MED ORDER — TRUE METRIX BLOOD GLUCOSE TEST VI STRP
ORAL_STRIP | 6 refills | Status: AC
  Filled 2022-06-09: qty 100, 33d supply, fill #0

## 2022-06-16 ENCOUNTER — Other Ambulatory Visit: Payer: Self-pay

## 2022-06-17 ENCOUNTER — Other Ambulatory Visit: Payer: Self-pay

## 2022-06-23 ENCOUNTER — Other Ambulatory Visit: Payer: Self-pay

## 2022-06-24 ENCOUNTER — Other Ambulatory Visit: Payer: Self-pay

## 2022-06-25 ENCOUNTER — Other Ambulatory Visit: Payer: Self-pay

## 2022-07-03 ENCOUNTER — Emergency Department (HOSPITAL_BASED_OUTPATIENT_CLINIC_OR_DEPARTMENT_OTHER)
Admission: EM | Admit: 2022-07-03 | Discharge: 2022-07-03 | Disposition: A | Discharge status: Home / Self Care | Payer: BC Managed Care – PPO | Attending: Emergency Medicine | Admitting: Emergency Medicine

## 2022-07-03 ENCOUNTER — Other Ambulatory Visit: Payer: Self-pay

## 2022-07-03 ENCOUNTER — Encounter (HOSPITAL_BASED_OUTPATIENT_CLINIC_OR_DEPARTMENT_OTHER): Payer: Self-pay | Admitting: Emergency Medicine

## 2022-07-03 DIAGNOSIS — R112 Nausea with vomiting, unspecified: Secondary | ICD-10-CM | POA: Diagnosis not present

## 2022-07-03 DIAGNOSIS — R509 Fever, unspecified: Secondary | ICD-10-CM | POA: Diagnosis not present

## 2022-07-03 DIAGNOSIS — Z7984 Long term (current) use of oral hypoglycemic drugs: Secondary | ICD-10-CM | POA: Insufficient documentation

## 2022-07-03 DIAGNOSIS — Z794 Long term (current) use of insulin: Secondary | ICD-10-CM | POA: Insufficient documentation

## 2022-07-03 DIAGNOSIS — Z20822 Contact with and (suspected) exposure to covid-19: Secondary | ICD-10-CM | POA: Insufficient documentation

## 2022-07-03 DIAGNOSIS — R197 Diarrhea, unspecified: Secondary | ICD-10-CM | POA: Diagnosis not present

## 2022-07-03 DIAGNOSIS — E119 Type 2 diabetes mellitus without complications: Secondary | ICD-10-CM | POA: Diagnosis not present

## 2022-07-03 LAB — RESP PANEL BY RT-PCR (RSV, FLU A&B, COVID)  RVPGX2
Influenza A by PCR: NEGATIVE
Influenza B by PCR: NEGATIVE
Resp Syncytial Virus by PCR: NEGATIVE
SARS Coronavirus 2 by RT PCR: NEGATIVE

## 2022-07-03 MED ORDER — ACETAMINOPHEN 500 MG PO TABS
1000.0000 mg | ORAL_TABLET | Freq: Once | ORAL | Status: AC
  Administered 2022-07-03: 1000 mg via ORAL
  Filled 2022-07-03: qty 2

## 2022-07-03 NOTE — Discharge Instructions (Signed)
You have been evaluated for your symptoms.  You have tested negative for COVID, flu, or RSV.  However, your history of nausea and vomiting may be a side effect of semaglutide.  If you develop similar symptoms after another injection of this medication please consider discontinue and discuss with your primary care provider.  Return to the ER if you have any concern.

## 2022-07-03 NOTE — ED Provider Notes (Signed)
[facility] Provider Note   CSN: [id number] Arrival date & time: 07/03/22  [id number]     History  No chief complaint on file.   Danielle Barker is a 47 y.o. female.  The history is provided by the patient and medical records. No language interpreter was used.     47 year old female significant history of bipolar, general anxiety, diabetes, hypercholesterolemia presenting to the ED with flulike symptoms.  Patient reports since yesterday she has had fever, chills, body aches, nausea, profuse vomiting of nonbloody nonbilious contents as well as having multiple episodes of loose stools.  She does not endorse any runny nose sneezing or coughing no sore throat no chest pain shortness of breath no abdominal pain no dysuria.  She has been around several coworkers that had [place] and flu recently.  She tries taking some over-the-counter medication at home without adequate relief.  Symptoms moderate in severity.  She denies any recent travel or eating any exotic food.  Home Medications Prior to Admission medications   Medication Sig Start Date End Date Taking? Authorizing Provider  ARIPiprazole (ABILIFY) 30 MG tablet Take 1 tablet (30 mg total) by mouth daily. 04/13/22   Armando Reichert, MD  azithromycin (ZITHROMAX) 250 MG tablet Take 1 tablet (250 mg total) by mouth daily. 04/17/22   Veryl Speak, MD  Blood Glucose Monitoring Suppl (TRUE METRIX METER) w/Device KIT use kit to check blood glucose 3 three times daily before meals. 03/31/21   Charlott Rakes, MD  clonazePAM (KLONOPIN) 1 MG tablet Take 0.5 tablets (0.5 mg total) by mouth 2 (two) times daily as needed for anxiety. 04/13/22   Armando Reichert, MD  escitalopram (LEXAPRO) 20 MG tablet Take 1 tablet (20 mg total) by mouth daily. 04/13/22   Armando Reichert, MD  fenofibrate (TRICOR) 48 MG tablet Take 1 tablet (48 mg total) by mouth daily. 01/01/22   Argentina Donovan, PA-C  gabapentin (NEURONTIN) 300 MG capsule  Take 2 capsules (600 mg total) by mouth 3 (three) times daily. 01/01/22 07/09/22  Argentina Donovan, PA-C  glucose blood (TRUE METRIX BLOOD GLUCOSE TEST) test strip Use 1 test strip to check blood glucose 3 times daily 06/09/22   Charlott Rakes, MD  guaiFENesin-codeine 100-10 MG/5ML syrup Take 10 mLs by mouth every 6 (six) hours as needed for cough. 04/17/22   Veryl Speak, MD  hydrochlorothiazide (HYDRODIURIL) 12.5 MG tablet Take 1 tablet (12.5 mg total) by mouth daily. 04/01/22   Charlott Rakes, MD  hydrOXYzine (ATARAX) 25 MG tablet Take 1 tablet (25 mg total) by mouth 3 (three) times daily as needed. 04/13/22   Armando Reichert, MD  insulin aspart (NOVOLOG FLEXPEN) 100 UNIT/ML FlexPen Inject 0-12 Units into the skin 3 (three) times daily with meals. As per sliding scale 01/01/22   Argentina Donovan, PA-C  insulin glargine (LANTUS) 100 UNIT/ML injection Inject 0.43 mLs (43 Units total) into the skin 2 (two) times daily. 04/01/22   Charlott Rakes, MD  Insulin Syringe-Needle U-100 (TRUEPLUS INSULIN SYRINGE) 31G X 5/16" 1 ML MISC Use to inject Lantus twice a day. 01/01/22   Argentina Donovan, PA-C  lamoTRIgine (LAMICTAL) 100 MG tablet Take 1 tablet (100 mg total) by mouth daily. 04/13/22   Armando Reichert, MD  meclizine (ANTIVERT) 25 MG tablet Take 1 tablet (25 mg total) by mouth 3 (three) times daily as needed for dizziness. 02/04/22   Deno Etienne, DO  metFORMIN (GLUCOPHAGE-XR) 500 MG 24 hr tablet Take one daily with  food for one week then two daily with food Patient taking differently: Take 500 mg by mouth 2 (two) times daily with a meal. 01/01/22   McClung, Dionne Bucy, PA-C  rosuvastatin (CRESTOR) 20 MG tablet Take 1 tablet (20 mg total) by mouth daily. 01/01/22   Argentina Donovan, PA-C  Semaglutide, 2 MG/DOSE, 8 MG/3ML SOPN Inject 2 mg as directed once a week. 01/01/22   Argentina Donovan, PA-C  sucralfate (CARAFATE) 1 g tablet Take 1 tablet (1 g total) by mouth 4 (four) times daily -  with meals and at bedtime for 14  days. 05/25/22 06/08/22  Curatolo, Adam, DO  TRUEplus Lancets 28G MISC use 1 lancet to check blood glucose 3 times daily before meals. 03/31/21   Charlott Rakes, MD  valACYclovir (VALTREX) 500 MG tablet Take 1 tablet (500 mg total) by mouth daily for prophylaxis. If out break occurs take 1 tablet twice a day x 5 days as needed for outbreak 01/01/22   Argentina Donovan, PA-C  citalopram (CELEXA) 40 MG tablet Take 40 mg by mouth daily.  11/25/20  [provider]      Allergies    Dilaudid [hydromorphone], Compazine [prochlorperazine], Reglan [metoclopramide], and Zofran [ondansetron]    Review of Systems   Review of Systems  All other systems reviewed and are negative.   Physical Exam Updated Vital Signs BP 113/78 (BP Location: Right Arm)   Pulse 80   Temp 98.1 F (36.7 C)   Resp 15   Ht '5\' 5"'$  (1.651 m)   Wt 91.2 kg   SpO2 99%   BMI 33.45 kg/m  Physical Exam Vitals and nursing note reviewed.  Constitutional:      General: She is not in acute distress.    Appearance: She is well-developed.  HENT:     Head: Atraumatic.  Eyes:     Conjunctiva/sclera: Conjunctivae normal.  Cardiovascular:     Rate and Rhythm: Normal rate and regular rhythm.     Pulses: Normal pulses.     Heart sounds: Normal heart sounds.  Pulmonary:     Effort: Pulmonary effort is normal.     Breath sounds: No wheezing, rhonchi or rales.  Abdominal:     Palpations: Abdomen is soft.     Tenderness: There is no abdominal tenderness.  Musculoskeletal:     Cervical back: Neck supple.  Skin:    Findings: No rash.  Neurological:     Mental Status: She is alert.  Psychiatric:        Mood and Affect: Mood normal.     ED Results / Procedures / Treatments   Labs (all labs ordered are listed, but only abnormal results are displayed) Labs Reviewed  RESP PANEL BY RT-PCR (RSV, FLU A&B, COVID)  [id number]    EKG None  Radiology No results found.  Procedures Procedures    Medications Ordered in  ED Medications  acetaminophen (TYLENOL) tablet 1,000 mg (has no administration in time range)    ED Course/ Medical Decision Making/ A&P                             Medical Decision Making  BP 113/78 (BP Location: Right Arm)   Pulse 80   Temp 98.1 F (36.7 C)   Resp 15   Ht '5\' 5"'$  (1.651 m)   Wt 91.2 kg   SpO2 99%   BMI 33.45 kg/m   17:60 AM 47 year old female significant  history of bipolar, general anxiety, diabetes, hypercholesterolemia presenting to the ED with flulike symptoms.  Patient reports since yesterday she has had fever, chills, body aches, nausea, profuse vomiting of nonbloody nonbilious contents as well as having multiple episodes of loose stools.  She does not endorse any runny nose sneezing or coughing no sore throat no chest pain shortness of breath no abdominal pain no dysuria.  She has been around several coworkers that had [place] and flu recently.  She tries taking some over-the-counter medication at home without adequate relief.  Symptoms moderate in severity.  She denies any recent travel or eating any exotic food.  On exam this is a well-appearing female resting comfortably in bed appears to be in no acute discomfort.  She does not have any nuchal rigidity, heart of normal rate and rhythm, lungs are clear to auscultation bilaterally abdomen is soft nontender with normal bowel sounds.  She is moving all 4 extremities with normal skin turgor's.  She is mentating appropriately.  Vital signs reviewed and overall reassuring no fever no hypoxia.  DDx: COVID, flu, RSV, viral illness, cholecystitis, appendicitis, pancreatitis.  11:17 AM Labs obtained and is negative for COVID, flu, or RSV.  Looking through patient's list of medication, she is currently on semaglutide.  I discussed this with patient and apparently she just started the medication yesterday.  I suspect her symptoms may be a side effect of this medication.  Fortunately patient has not had any vomiting during  this ER visit.  She is also allergic to numerous antiemetic with exceptions of Ativan.  I offered patient alcohol pads to sniff on as it can help with nausea, patient is willing to try.  I gave patient return precautions in the event that she develop worsening abdominal pain or having persistent vomiting despite discontinuing this medication.  She should also follow-up with her doctor for further guidance.  Tylenol was given for headache with improvement of symptoms.        Final Clinical Impression(s) / ED Diagnoses Final diagnoses:  Nausea and vomiting, unspecified vomiting type    Rx / DC Orders ED Discharge Orders     None         Domenic Moras, PA-C 07/03/22 1125    Wyvonnia Dusky, MD 07/03/22 [phone]

## 2022-07-03 NOTE — ED Triage Notes (Signed)
Pt arrives to ED with c/o flu like symptoms that started yesterday.

## 2022-07-15 ENCOUNTER — Ambulatory Visit: Payer: Self-pay

## 2022-07-15 DIAGNOSIS — F410 Panic disorder [episodic paroxysmal anxiety] without agoraphobia: Secondary | ICD-10-CM

## 2022-07-15 NOTE — Telephone Encounter (Signed)
Chief Complaint: Referral for behavioral health and refill on clonazepam Symptoms: None Frequency: N/A Pertinent Negatives: Patient denies thoughts of suicide, acute symptoms of depression Disposition: [] ED /[] Urgent Care (no appt availability in office) / [] Appointment(In office/virtual)/ []  [facility]/ [] Home Care/ [] Refused Recommended Disposition /[] [facility]/ [x]  Follow-up with PCP Additional Notes: Patient says she is no longer qualified due to having health insurance to go to [facility] to see a provider, so she will need a referral to see a new therapist. She says her last time going was 2-3 months ago and she usually goes once every 4-6 weeks. She says that provider refilled her psych meds, but she will need Dr. Margarita Rana to refill the clonazepam that she uses for anxiety. She says all other meds are fine until she is seen in the office on 08/05/22. Advised I will send this to Dr. Margarita Rana.    Summary: medication management   Pt called in requesting a referral for mental health and stated she needs help with medication management. Pt did not provide any further details stated she is okay.  Pt scheduled for 04/10.  Pt seeking clinical advice.         Reason for Disposition  Caller requesting a CONTROLLED substance prescription refill (e.g., narcotics, ADHD medicines)  Answer Assessment - Initial Assessment Questions 1. DRUG NAME: "What medicine do you need to have refilled?"    clonazepam 2. REFILLS REMAINING: "How many refills are remaining?" (Note: The label on the medicine or pill bottle will show how many refills are remaining. If there are no refills remaining, then a renewal may be needed.)     0 3. EXPIRATION DATE: "What is the expiration date?" (Note: The label states when the prescription will expire, and thus can no longer be refilled.)     N/A 4. PRESCRIBING HCP: "Who prescribed it?" Reason: If prescribed by specialist, call should  be referred to that group.     [facility] provider 5. SYMPTOMS: "Do you have any symptoms?"     Not at this time, will need clonazepam to help keep anxiety under control  Protocols used: Medication Refill and Renewal Call-A-AH

## 2022-07-16 ENCOUNTER — Emergency Department (HOSPITAL_BASED_OUTPATIENT_CLINIC_OR_DEPARTMENT_OTHER)
Admission: EM | Admit: 2022-07-16 | Discharge: 2022-07-16 | Disposition: A | Discharge status: Home / Self Care | Payer: BC Managed Care – PPO | Attending: Emergency Medicine | Admitting: Emergency Medicine

## 2022-07-16 ENCOUNTER — Encounter (HOSPITAL_BASED_OUTPATIENT_CLINIC_OR_DEPARTMENT_OTHER): Payer: Self-pay | Admitting: Emergency Medicine

## 2022-07-16 ENCOUNTER — Emergency Department (HOSPITAL_BASED_OUTPATIENT_CLINIC_OR_DEPARTMENT_OTHER): Payer: BC Managed Care – PPO

## 2022-07-16 ENCOUNTER — Other Ambulatory Visit: Payer: Self-pay

## 2022-07-16 DIAGNOSIS — E119 Type 2 diabetes mellitus without complications: Secondary | ICD-10-CM | POA: Diagnosis not present

## 2022-07-16 DIAGNOSIS — Z79899 Other long term (current) drug therapy: Secondary | ICD-10-CM | POA: Diagnosis not present

## 2022-07-16 DIAGNOSIS — Z794 Long term (current) use of insulin: Secondary | ICD-10-CM | POA: Diagnosis not present

## 2022-07-16 DIAGNOSIS — R519 Headache, unspecified: Secondary | ICD-10-CM | POA: Insufficient documentation

## 2022-07-16 LAB — CBC WITH DIFFERENTIAL/PLATELET
Abs Immature Granulocytes: 0.02 10*3/uL (ref 0.00–0.07)
Basophils Absolute: 0.1 10*3/uL (ref 0.0–0.1)
Basophils Relative: 1 %
Eosinophils Absolute: 0.1 10*3/uL (ref 0.0–0.5)
Eosinophils Relative: 1 %
HCT: 38.3 % (ref 36.0–46.0)
Hemoglobin: 13.1 g/dL (ref 12.0–15.0)
Immature Granulocytes: 0 %
Lymphocytes Relative: 34 %
Lymphs Abs: 2.7 10*3/uL (ref 0.7–4.0)
MCH: 31 pg (ref 26.0–34.0)
MCHC: 34.2 g/dL (ref 30.0–36.0)
MCV: 90.5 fL (ref 80.0–100.0)
Monocytes Absolute: 0.5 10*3/uL (ref 0.1–1.0)
Monocytes Relative: 6 %
Neutro Abs: 4.7 10*3/uL (ref 1.7–7.7)
Neutrophils Relative %: 58 %
Platelets: 345 10*3/uL (ref 150–400)
RBC: 4.23 MIL/uL (ref 3.87–5.11)
RDW: 12.7 % (ref 11.5–15.5)
WBC: 8.1 10*3/uL (ref 4.0–10.5)
nRBC: 0 % (ref 0.0–0.2)

## 2022-07-16 LAB — COMPREHENSIVE METABOLIC PANEL
ALT: 17 U/L (ref 0–44)
AST: 13 U/L — ABNORMAL LOW (ref 15–41)
Albumin: 4.4 g/dL (ref 3.5–5.0)
Alkaline Phosphatase: 64 U/L (ref 38–126)
Anion gap: 9 (ref 5–15)
BUN: 11 mg/dL (ref 6–20)
CO2: 26 mmol/L (ref 22–32)
Calcium: 10 mg/dL (ref 8.9–10.3)
Chloride: 101 mmol/L (ref 98–111)
Creatinine, Ser: 0.78 mg/dL (ref 0.44–1.00)
GFR, Estimated: >60 mL/min (ref >60)
Glucose, Bld: 91 mg/dL (ref 70–99)
Potassium: 3.4 mmol/L — ABNORMAL LOW (ref 3.5–5.1)
Sodium: 136 mmol/L (ref 135–145)
Total Bilirubin: 0.5 mg/dL (ref 0.3–1.2)
Total Protein: 7.2 g/dL (ref 6.5–8.1)

## 2022-07-16 LAB — HCG, SERUM, QUALITATIVE: Preg, Serum: NEGATIVE

## 2022-07-16 MED ORDER — KETOROLAC TROMETHAMINE 30 MG/ML IJ SOLN
30.0000 mg | Freq: Once | INTRAMUSCULAR | Status: AC
  Administered 2022-07-16: 30 mg via INTRAVENOUS
  Filled 2022-07-16: qty 1

## 2022-07-16 MED ORDER — ACETAMINOPHEN 500 MG PO TABS
1000.0000 mg | ORAL_TABLET | Freq: Once | ORAL | Status: AC
  Administered 2022-07-16: 1000 mg via ORAL
  Filled 2022-07-16: qty 2

## 2022-07-16 MED ORDER — IOHEXOL 350 MG/ML SOLN
75.0000 mL | Freq: Once | INTRAVENOUS | Status: AC | PRN
  Administered 2022-07-16: 75 mL via INTRAVENOUS

## 2022-07-16 NOTE — ED Triage Notes (Signed)
Pt arrives to ED with c/o headache that started x3 hours ago. She notes blurry vision. Pain 4/10.

## 2022-07-16 NOTE — ED Provider Notes (Signed)
[facility] Provider Note   CSN: [id number] Arrival date & time: 07/16/22  1459     History  Chief Complaint  Patient presents with   Headache    ALYLA NIEDERHAUSER is a 47 y.o. female.  HPI     47 year old female with a history of diabetes, hyperlipidemia, bipolar disorder, presents with concern for headache and vision changes.  Reports for the last few days she has been able to walk normally but has been stumbling at times.  She developed a headache around 10 AM today, which was initially a 7 out of 10.  She took Tylenol.  After lunch around 1 PM, she noted visual changes.  Describes primarily of blurred vision that is still persistent however much improved.  Reports that when she looks around the room things appear to be normal but she tries to read her phone it does appear to be blurry.  Earlier, she had more significant blurred vision and felt that the words were blurring together, had moments where she would see 2 of words but not persistent double vision, no visual field deficits.  Denies focal numbness, weakness.  Has a history of carpal tunnel with chronic and intermittent left-sided hand tingling which has also been present today.  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  Does not have a history of headaches.  Denies photophobia.  Denies fevers, cough, congestion, chest pain, shortness of breath, abdominal pain vomiting,. Reports a history of IBS and has had more significant diarrhea over the last week. Occasional alcohol, does not smoke cigarettes or use other drugs. Uncle has a history of CVA, early MI, grandfather had early MI.  No personal history of  known heart disease   Past Medical History:  Diagnosis Date   Diabetes mellitus without complication ([place])    High cholesterol    High triglycerides    HSV (herpes simplex virus) infection    Mood disorder ([place])      Home Medications Prior to Admission medications   Medication Sig Start Date End Date Taking? Authorizing Provider  ARIPiprazole (ABILIFY) 30 MG tablet Take 1 tablet (30 mg total) by mouth daily. 04/13/22   Armando Reichert, MD  azithromycin (ZITHROMAX) 250 MG tablet Take 1 tablet (250 mg total) by mouth daily. 04/17/22   Veryl Speak, MD  Blood Glucose Monitoring Suppl (TRUE METRIX METER) w/Device KIT use kit to check blood glucose 3 three times daily before meals. 03/31/21   Charlott Rakes, MD  clonazePAM (KLONOPIN) 1 MG tablet Take 0.5 tablets (0.5 mg total) by mouth 2 (two) times daily as needed for anxiety. 04/13/22   Armando Reichert, MD  escitalopram (LEXAPRO) 20 MG tablet Take 1 tablet (20 mg total) by mouth daily. 04/13/22   Armando Reichert, MD  fenofibrate (TRICOR) 48 MG tablet Take 1 tablet (48 mg total) by mouth daily. 01/01/22   Argentina Donovan, PA-C  gabapentin (NEURONTIN) 300 MG capsule Take 2 capsules (600 mg total) by mouth 3 (three) times daily. 01/01/22 07/09/22  Argentina Donovan, PA-C  glucose blood (TRUE METRIX BLOOD GLUCOSE TEST) test strip Use 1 test strip to check blood glucose 3 times daily 06/09/22   Charlott Rakes, MD  guaiFENesin-codeine  100-10 MG/5ML syrup Take 10 mLs by mouth every 6 (six) hours as needed for cough. 04/17/22   Veryl Speak, MD  hydrochlorothiazide (HYDRODIURIL) 12.5 MG tablet Take 1 tablet (12.5 mg total) by mouth daily. 04/01/22   Charlott Rakes, MD  hydrOXYzine (ATARAX) 25 MG tablet Take 1 tablet (25 mg total) by mouth 3 (three) times daily as needed. 04/13/22   Armando Reichert, MD  insulin aspart (NOVOLOG FLEXPEN) 100 UNIT/ML FlexPen Inject 0-12 Units into the skin 3 (three) times daily with meals. As per sliding scale 01/01/22   Argentina Donovan, PA-C  insulin glargine (LANTUS) 100 UNIT/ML injection Inject 0.43 mLs (43 Units total) into the skin 2 (two) times daily. 04/01/22   Charlott Rakes, MD  Insulin Syringe-Needle U-100 (TRUEPLUS INSULIN SYRINGE) 31G X 5/16" 1 ML MISC Use to inject Lantus twice a day. 01/01/22   Argentina Donovan, PA-C  lamoTRIgine (LAMICTAL) 100 MG tablet Take 1 tablet (100 mg total) by mouth daily. 04/13/22   Armando Reichert, MD  meclizine (ANTIVERT) 25 MG tablet Take 1 tablet (25 mg total) by mouth 3 (three) times daily as needed for dizziness. 02/04/22   Deno Etienne, DO  metFORMIN (GLUCOPHAGE-XR) 500 MG 24 hr tablet Take one daily with food for one week then two daily with food Patient taking differently: Take 500 mg by mouth 2 (two) times daily with a meal. 01/01/22   McClung, Dionne Bucy, PA-C  rosuvastatin (CRESTOR) 20 MG tablet Take 1 tablet (20 mg total) by mouth daily. 01/01/22   Argentina Donovan, PA-C  Semaglutide, 2 MG/DOSE, 8 MG/3ML SOPN Inject 2 mg as directed once a week. 01/01/22   Argentina Donovan, PA-C  sucralfate (CARAFATE) 1 g tablet Take 1 tablet (1 g total) by mouth 4 (four) times daily -  with meals and at bedtime for 14 days. 05/25/22 06/08/22  Curatolo, Adam, DO  TRUEplus Lancets 28G MISC use 1 lancet to check blood glucose 3 times daily before meals. 03/31/21   Charlott Rakes, MD  valACYclovir (VALTREX) 500 MG tablet Take 1 tablet (500 mg total) by mouth daily for prophylaxis. If out break occurs take 1 tablet twice a day x 5 days as needed for outbreak 01/01/22   Argentina Donovan, PA-C  citalopram (CELEXA) 40 MG tablet Take 40 mg by mouth daily.  11/25/20  [provider]      Allergies    Dilaudid [hydromorphone], Compazine [prochlorperazine], Reglan [metoclopramide], and Zofran [ondansetron]    Review of Systems   Review of Systems  Physical Exam Updated Vital Signs BP 102/65   Pulse 79   Temp 98.5 F (36.9 C) (Oral)   Resp 17   Ht 5\' 5"  (1.651 m)   Wt 90.3 kg   SpO2 98%    BMI 33.12 kg/m  Physical Exam Vitals and nursing note reviewed.  Constitutional:      General: She is not in acute distress.    Appearance: Normal appearance. She is well-developed. She is not ill-appearing or diaphoretic.  HENT:     Head: Normocephalic and atraumatic.  Eyes:     General: No visual field deficit.    Extraocular Movements: Extraocular movements intact.     Conjunctiva/sclera: Conjunctivae normal.     Pupils: Pupils are equal, round, and reactive to light.  Cardiovascular:     Rate and Rhythm: Normal rate and regular rhythm.     Pulses: Normal pulses.  Pulmonary:     Effort: Pulmonary effort is normal. No  respiratory distress.  Abdominal:     General: There is no distension.     Palpations: Abdomen is soft.     Tenderness: There is no abdominal tenderness. There is no guarding.  Musculoskeletal:        General: No swelling or tenderness.     Cervical back: Normal range of motion.  Skin:    General: Skin is warm and dry.     Findings: No erythema or rash.  Neurological:     General: No focal deficit present.     Mental Status: She is alert and oriented to person, place, and time.     GCS: GCS eye subscore is 4. GCS verbal subscore is 5. GCS motor subscore is 6.     Cranial Nerves: No cranial nerve deficit, dysarthria or facial asymmetry.     Sensory: No sensory deficit.     Motor: No weakness or tremor.     Coordination: Coordination normal. Finger-Nose-Finger Test normal.     Gait: Gait normal.     ED Results / Procedures / Treatments   Labs (all labs ordered are listed, but only abnormal results are displayed) Labs Reviewed  COMPREHENSIVE METABOLIC PANEL - Abnormal; Notable for the following components:      Result Value   Potassium 3.4 (*)    AST 13 (*)    All other components within normal limits  CBC WITH DIFFERENTIAL/PLATELET  HCG, SERUM, QUALITATIVE    EKG EKG Interpretation  Date/Time:  Thursday July 16 2022 15:55:21 EDT Ventricular  Rate:  78 PR Interval:  170 QRS Duration: 97 QT Interval:  388 QTC Calculation: 442 R Axis:   76 Text Interpretation: Sinus rhythm No significant change since last tracing Confirmed by Gareth Morgan [phone]) on 07/17/2022 9:34:30 AM  Radiology CT ANGIO HEAD NECK W WO CM  Result Date: 07/16/2022 CLINICAL DATA:  Headache, blurry vision EXAM: CT ANGIOGRAPHY HEAD AND NECK TECHNIQUE: Multidetector CT imaging of the head and neck was performed using the standard protocol during bolus administration of intravenous contrast. Multiplanar CT image reconstructions and MIPs were obtained to evaluate the vascular anatomy. Carotid stenosis measurements (when applicable) are obtained utilizing NASCET criteria, using the distal internal carotid diameter as the denominator. RADIATION DOSE REDUCTION: This exam was performed according to the departmental dose-optimization program which includes automated exposure control, adjustment of the mA and/or kV according to patient size and/or use of iterative reconstruction technique. CONTRAST:  17mL OMNIPAQUE IOHEXOL 350 MG/ML SOLN COMPARISON:  CT head 08/21/2021, no prior CTA head and neck FINDINGS: CT HEAD FINDINGS Brain: No evidence of acute infarct, hemorrhage, mass, mass effect, or midline shift. No hydrocephalus or extra-axial fluid collection. Vascular: No hyperdense vessel. Skull: Negative for fracture or focal lesion. Sinuses/Orbits: No acute finding. Other: The mastoid air cells are well aerated. CTA NECK FINDINGS Aortic arch: Standard branching. Imaged portion shows no evidence of aneurysm or dissection. No significant stenosis of the major arch vessel origins. Right carotid system: No evidence of stenosis, dissection, or occlusion. Retropharyngeal course of the right ICA. Left carotid system: No evidence of stenosis, dissection, or occlusion. Vertebral arteries: No evidence of stenosis, dissection, or occlusion. Skeleton: No acute osseous abnormality. Other neck:  Negative. Upper chest: No focal pulmonary opacity or pleural effusion. Review of the MIP images confirms the above findings CTA HEAD FINDINGS Anterior circulation: Both internal carotid arteries are patent to the termini, without significant stenosis. A1 segments patent. Normal anterior communicating artery. Anterior cerebral arteries are patent to their distal  aspects. No M1 stenosis or occlusion. MCA branches perfused and symmetric. Posterior circulation: Vertebral arteries patent to the vertebrobasilar junction without stenosis. Posterior inferior cerebellar arteries patent proximally. Basilar patent to its distal aspect. Superior cerebellar arteries patent proximally. Patent P1 segments. PCAs perfused to their distal aspects without stenosis. The right posterior communicating artery is patent. Venous sinuses: Not well opacified due to arterial timing. Anatomic variants: None significant. Review of the MIP images confirms the above findings IMPRESSION: 1. No acute intracranial process. 2. No intracranial large vessel occlusion or significant stenosis. 3. No hemodynamically significant stenosis in the neck. Electronically Signed   By: Merilyn Baba M.D.   On: 07/16/2022 17:17    Procedures Procedures    Medications Ordered in ED Medications  iohexol (OMNIPAQUE) 350 MG/ML injection 75 mL (75 mLs Intravenous Contrast Given 07/16/22 1645)  ketorolac (TORADOL) 30 MG/ML injection 30 mg (30 mg Intravenous Given 07/16/22 2020)  acetaminophen (TYLENOL) tablet 1,000 mg (1,000 mg Oral Given 07/16/22 2020)    ED Course/ Medical Decision Making/ A&P                              47 year old female with a history of diabetes, hyperlipidemia, bipolar disorder, presents with concern for headache and vision changes.  Differential diagnosis includes ICH, CVA, electrolyte abnormalities, migraine, other diabetic eye disease.  She does not have focal abnormalities on exam, describes some symptoms beginning days ago  and at 10 AM and feel risk of thrombolytics is higher than potential benefit with low suspicion for CVA clinically and will not call code stroke.  Labs obtained and personally about interpreted by me show no leukocytosis, no anemia, electrolytes with mild hypokalemia, no clinically significant abnormalities.   Given headache with associated visual changes, ordered CTA to evaluate for signs of aneurysm or emergent vessel occlusion.  CTA evaluated by me and radiology shows no sign of aneurysm, LVO, significant disease.  She reports severe claustrophobia and will not tolerate MRI--that being said, her description of visual changes is not consistent with CVA-no persistent diplopia, no visual field deficits.  Reports when she looks around now vision is normal but seems off when she looks at her phone but is not having double vision.  The symptoms are binocular and not consistent with retinal detachment, glaucoma (normal pupils/bilateral symptoms), CRAO,CRVO.  She does report some background of slower visual changes--recommend follow up with her ophthalmologist.  Does not have prior history of headaches, do not feel emergent LP indicated at this time for evaluation for IIH.  Headache improved after toradol. Recommend PCP follow up and Ophthalmology follow up, may consider neurology pending clinical course and pcp follow up. Patient discharged in stable condition with understanding of reasons to return.         Final Clinical Impression(s) / ED Diagnoses Final diagnoses:  Acute nonintractable headache, unspecified headache type    Rx / DC Orders ED Discharge Orders     None         Gareth Morgan, MD 07/17/22 0945

## 2022-07-16 NOTE — ED Notes (Signed)
RN reviewed discharge instructions with pt. Pt verbalized understanding and had no further questions. VSS upon discharge.  

## 2022-07-16 NOTE — Telephone Encounter (Signed)
Unfortunately that is not a Prescription I prescribe but I have referred her to Psych. They will need to provide her medications to last her until she can get established with Psych.

## 2022-07-17 ENCOUNTER — Other Ambulatory Visit: Payer: Self-pay

## 2022-07-21 ENCOUNTER — Other Ambulatory Visit: Payer: Self-pay

## 2022-07-22 ENCOUNTER — Ambulatory Visit (INDEPENDENT_AMBULATORY_CARE_PROVIDER_SITE_OTHER): Payer: No Payment, Other | Admitting: Physician Assistant

## 2022-07-22 ENCOUNTER — Encounter (HOSPITAL_COMMUNITY): Payer: Self-pay | Admitting: Physician Assistant

## 2022-07-22 ENCOUNTER — Other Ambulatory Visit: Payer: Self-pay

## 2022-07-22 DIAGNOSIS — F3181 Bipolar II disorder: Secondary | ICD-10-CM

## 2022-07-22 DIAGNOSIS — F410 Panic disorder [episodic paroxysmal anxiety] without agoraphobia: Secondary | ICD-10-CM | POA: Diagnosis not present

## 2022-07-22 DIAGNOSIS — F411 Generalized anxiety disorder: Secondary | ICD-10-CM

## 2022-07-22 MED ORDER — LAMOTRIGINE 100 MG PO TABS
100.0000 mg | ORAL_TABLET | Freq: Every day | ORAL | 2 refills | Status: AC
  Filled 2022-09-02: qty 30, 30d supply, fill #0

## 2022-07-22 MED ORDER — ARIPIPRAZOLE 30 MG PO TABS
30.0000 mg | ORAL_TABLET | Freq: Every day | ORAL | 2 refills | Status: AC
  Filled 2022-07-22 – 2022-08-05 (×2): qty 30, 30d supply, fill #0
  Filled 2022-09-02: qty 30, 30d supply, fill #1

## 2022-07-22 MED ORDER — ESCITALOPRAM OXALATE 10 MG PO TABS
30.0000 mg | ORAL_TABLET | Freq: Every day | ORAL | 2 refills | Status: AC
  Filled 2022-07-22 – 2022-08-17 (×2): qty 90, 30d supply, fill #0

## 2022-07-22 MED ORDER — CLONAZEPAM 1 MG PO TABS: 0.5000 mg | ORAL_TABLET | Freq: Two times a day (BID) | ORAL | 1 refills | Status: AC | PRN

## 2022-07-22 NOTE — Progress Notes (Signed)
BH MD/PA/NP OP Progress Note  07/22/2022 9:04 AM Danielle Barker  MRN:  [id number]  Chief Complaint:  Chief Complaint  Patient presents with   Follow-up   Medication Management   HPI:   Danielle Barker is a 47 year old, Caucasian female with a past psychiatric history significant for bipolar 2 disorder and generalized anxiety disorder (with panic attacks) who presents to [facility] for follow up and medication management.  Patient was last seen by Armando Reichert, MD on 04/13/2022.  During her last encounter, patient was being managed on the following psychiatric medications:  Escitalopram 20 mg daily Lamotrigine 100 mg daily Hydroxyzine 25 mg 3 times daily as needed Abilify 30 mg daily Clonazepam 0.5 mg 2 times daily as needed  The patient presents today stating that her lamotrigine dose was lowered last encounter due to causing possible sleepiness.  Patient states that she was also placed on Atarax which she reports definitely caused her sleepiness and grogginess.  Patient states that she also experiences a hangover effect when taking Atarax.  Patient rates her anxiety at 10 out of 10 and states that her anxiety is often accompanied by shaking and nervous movements.  Patient notes that it has also been hard for her to focus regularly.  In addition to her anxiety, patient endorses depression stating that she has been down on herself due to losing her most recent job due to not being able to handle the workload/pace.  Patient rates her depression as 5 out of 10 with 10 being most severe.  She endorses depressive episodes every day but denies experiencing instances of suicidal ideations.  Patient endorses the following depressive symptoms: decreased appetite, lack of motivation, decreased activities of daily living, low mood, and increased sleep.  Patient's main stressor revolves around finances stating that she has multiple bills that she must pay.  She  reports that her depression appears to be better managed when taking escitalopram 40 mg daily.  She reports that the previous provider lowered her dose due to escitalopram 40 mg being more effective in managing symptoms of PTSD.  Patient has tried other medications in the past including Paxil, Trintellix, Prozac, and Zoloft.  She reports that Zoloft was helpful in the past but experienced some anger issues while on the medication.  Patient would like to increase her dosage of Lexapro to 30 mg to see if it is effective in managing her depressive symptoms.  A PHQ-9 screen was performed with the patient scoring an 18.  A GAD-7 screen was also performed with the patient scoring a 21.  Patient is alert and oriented x 4, calm, cooperative, and fully engaged in conversation during the encounter.  Patient denies suicidal or homicidal ideations.  She further denies auditory or visual hallucinations and does not appear to be responding to internal/external stimuli.  Patient endorses receiving too much sleep and receives on average 12 to 14 hours of sleep a day.  Patient endorses fair appetite and eats on average 2 meals per day.  Patient denies alcohol consumption, tobacco use, and illicit drug use.  Visit Diagnosis:    ICD-10-CM   1. Bipolar 2 disorder (HCC)  F31.81 ARIPiprazole (ABILIFY) 30 MG tablet    escitalopram (LEXAPRO) 10 MG tablet    lamoTRIgine (LAMICTAL) 100 MG tablet    2. Generalized anxiety disorder with panic attacks  F41.1 escitalopram (LEXAPRO) 10 MG tablet   F41.0 clonazePAM (KLONOPIN) 1 MG tablet      Past Psychiatric  History:  Insomnia Generalized anxiety disorder Bipolar 2 disorder  Denies past history of suicide attempt.   Hospitalized psychiatrically 3 times most recently 56 years ago in [place]  Past Medical History:  Past Medical History:  Diagnosis Date   Diabetes mellitus without complication (HCC)    High cholesterol    High triglycerides    HSV (herpes simplex virus)  infection    Mood disorder (HCC)     Past Surgical History:  Procedure Laterality Date   APPENDECTOMY     CHOLECYSTECTOMY     SHOULDER SURGERY Left    TONSILLECTOMY      Family Psychiatric History:  Aunt (maternal) - schizophrenic Mother - unipolar/severe depression Father - Alcohol abuse, anxiety, possible bipolar but has not been diagnosed Technical brewer - anxious, paranoid Biological Sister - Anxiety, recovering alcoholic (sober for 4 years)  Family History:  Family History  Problem Relation Age of Onset   Cancer Mother    Heart failure Mother    Cancer Father     Social History:  Social History   Socioeconomic History   Marital status: Divorced    Spouse name: Not on file   Number of children: Not on file   Years of education: Not on file   Highest education level: Not on file  Occupational History   Not on file  Tobacco Use   Smoking status: Never   Smokeless tobacco: Never  Vaping Use   Vaping Use: Never used  Substance and Sexual Activity   Alcohol use: Yes    Comment: rarely   Drug use: Never   Sexual activity: Yes  Other Topics Concern   Not on file  Social History Narrative   Not on file   Social Determinants of Health   Financial Resource Strain: Not on file  Food Insecurity: Not on file  Transportation Needs: Not on file  Physical Activity: Not on file  Stress: Not on file  Social Connections: Not on file    Allergies:  Allergies  Allergen Reactions   Dilaudid [Hydromorphone]     "Makes me feel real stoned out" per pt    Compazine [Prochlorperazine] Palpitations    Tachycardia   Reglan [Metoclopramide] Palpitations    Tachycardia    Zofran [Ondansetron] Palpitations    Tachycardia    Metabolic Disorder Labs: Lab Results  Component Value Date   HGBA1C 8.1 (A) 04/01/2022   No results found for: "PROLACTIN" Lab Results  Component Value Date   CHOL 147 01/01/2022   TRIG 143 01/01/2022   HDL 39 (L) 01/01/2022   CHOLHDL  3.8 01/01/2022   LDLCALC 83 01/01/2022   Lab Results  Component Value Date   TSH 3.727 03/05/2021    Therapeutic Level Labs: No results found for: "LITHIUM" No results found for: "VALPROATE" No results found for: "CBMZ"  Current Medications: Current Outpatient Medications  Medication Sig Dispense Refill   ARIPiprazole (ABILIFY) 30 MG tablet Take 1 tablet (30 mg total) by mouth daily. 30 tablet 2   azithromycin (ZITHROMAX) 250 MG tablet Take 1 tablet (250 mg total) by mouth daily. 4 tablet 0   Blood Glucose Monitoring Suppl (TRUE METRIX METER) w/Device KIT use kit to check blood glucose 3 three times daily before meals. 1 kit 0   clonazePAM (KLONOPIN) 1 MG tablet Take 0.5 tablets (0.5 mg total) by mouth 2 (two) times daily as needed for anxiety. 30 tablet 1   escitalopram (LEXAPRO) 10 MG tablet Take 3 tablets (30 mg total) by  mouth daily. 90 tablet 2   fenofibrate (TRICOR) 48 MG tablet Take 1 tablet (48 mg total) by mouth daily. 90 tablet 2   gabapentin (NEURONTIN) 300 MG capsule Take 2 capsules (600 mg total) by mouth 3 (three) times daily. 180 capsule 3   glucose blood (TRUE METRIX BLOOD GLUCOSE TEST) test strip Use 1 test strip to check blood glucose 3 times daily 100 each 6   guaiFENesin-codeine 100-10 MG/5ML syrup Take 10 mLs by mouth every 6 (six) hours as needed for cough. 120 mL 0   hydrochlorothiazide (HYDRODIURIL) 12.5 MG tablet Take 1 tablet (12.5 mg total) by mouth daily. 90 tablet 1   hydrOXYzine (ATARAX) 25 MG tablet Take 1 tablet (25 mg total) by mouth 3 (three) times daily as needed. 30 tablet 2   insulin aspart (NOVOLOG FLEXPEN) 100 UNIT/ML FlexPen Inject 0-12 Units into the skin 3 (three) times daily with meals. As per sliding scale 15 mL 11   insulin glargine (LANTUS) 100 UNIT/ML injection Inject 0.43 mLs (43 Units total) into the skin 2 (two) times daily. 20 mL 3   Insulin Syringe-Needle U-100 (TRUEPLUS INSULIN SYRINGE) 31G X 5/16" 1 ML MISC Use to inject Lantus twice  a day. 100 each 3   lamoTRIgine (LAMICTAL) 100 MG tablet Take 1 tablet (100 mg total) by mouth daily. 30 tablet 2   meclizine (ANTIVERT) 25 MG tablet Take 1 tablet (25 mg total) by mouth 3 (three) times daily as needed for dizziness. 30 tablet 0   metFORMIN (GLUCOPHAGE-XR) 500 MG 24 hr tablet Take one daily with food for one week then two daily with food (Patient taking differently: Take 500 mg by mouth 2 (two) times daily with a meal.) 180 tablet 1   rosuvastatin (CRESTOR) 20 MG tablet Take 1 tablet (20 mg total) by mouth daily. 90 tablet 1   Semaglutide, 2 MG/DOSE, 8 MG/3ML SOPN Inject 2 mg as directed once a week. 9 mL 2   sucralfate (CARAFATE) 1 g tablet Take 1 tablet (1 g total) by mouth 4 (four) times daily -  with meals and at bedtime for 14 days. 56 tablet 0   TRUEplus Lancets 28G MISC use 1 lancet to check blood glucose 3 times daily before meals. 100 each 12   valACYclovir (VALTREX) 500 MG tablet Take 1 tablet (500 mg total) by mouth daily for prophylaxis. If out break occurs take 1 tablet twice a day x 5 days as needed for outbreak 120 tablet 1   No current facility-administered medications for this visit.     Musculoskeletal: Strength & Muscle Tone: within normal limits Gait & Station: normal Patient leans: N/A  Psychiatric Specialty Exam: Review of Systems  Psychiatric/Behavioral:  Positive for decreased concentration and sleep disturbance. Negative for dysphoric mood, hallucinations, self-injury and suicidal ideas. The patient is nervous/anxious. The patient is not hyperactive.     Blood pressure 136/82, pulse 77, height 5\' 5"  (1.651 m), weight 206 lb (93.4 kg), SpO2 100 %.Body mass index is 34.28 kg/m.  General Appearance: Casual  Eye Contact:  Good  Speech:  Clear and Coherent and Normal Rate  Volume:  Normal  Mood:  Anxious and Depressed  Affect:  Appropriate  Thought Process:  Coherent, Goal Directed, and Descriptions of Associations: Intact  Orientation:  Full  (Time, Place, and Person)  Thought Content: WDL   Suicidal Thoughts:  No  Homicidal Thoughts:  No  Memory:  Immediate;   Good Recent;   Good Remote;   Good  Judgement:  Good  Insight:  Good  Psychomotor Activity:  Normal  Concentration:  Concentration: Good and Attention Span: Good  Recall:  Good  Fund of Knowledge: Good  Language: Good  Akathisia:  No  Handed:  Right  AIMS (if indicated): not done  Assets:  Communication Skills Desire for Improvement Financial Resources/Insurance Housing Social Support Transportation Vocational/Educational  ADL's:  Intact  Cognition: WNL  Sleep:  Fair, patient endorses hypersomnia   Screenings: GAD-7    Flowsheet Row Clinical Support from 07/22/2022 in [facility] Office Visit from 04/01/2022 in [place] Office Visit from 01/01/2022 in [place] Office Visit from 12/12/2021 in [facility] Office Visit from 08/26/2021 in [facility]  Total GAD-7 Score 21 14 7 21 13       PHQ2-9    Flowsheet Row Clinical Support from 07/22/2022 in [facility] Office Visit from 04/01/2022 in [place] Office Visit from 01/01/2022 in [place] Office Visit from 12/12/2021 in [facility] Office Visit from 12/11/2021 in [facility]  PHQ-2 Total Score 5 0 0 0 0  PHQ-9 Total Score 18 0 -- -- --      Flowsheet Row Clinical Support from 07/22/2022 in [facility] ED from 07/16/2022 in [facility] Emergency Department at [facility] ED from 07/03/2022 in [facility] Emergency Department at [place] No Risk No Risk No Risk        Assessment and Plan:   Danielle Barker is a 47 year old,  Caucasian female with a past psychiatric history significant for bipolar 2 disorder and generalized anxiety disorder (with panic attacks) who presents to [facility] for follow up and medication management.  Since her last encounter, patient states that she has been experiencing depression and anxiety that appears to be attributed to recently losing her job.  Patient also notes that her escitalopram dosage was lowered during her last encounter due to her previous dosage being more effective in managing symptoms of PTSD.  Patient has been on numerous antidepressants in the past but states that escitalopram appears to be more effective in managing her depressive symptoms.  Patient reports that she would like to increase her escitalopram to 30 mg daily to see if it would manage her depressive symptoms and anxiety.  Provider to increase patient's escitalopram from 20 mg to 30 mg daily for the management of her depressive symptoms and anxiety.  Patient's medications to be e-prescribed to pharmacy of choice.  Provider to reassess patient's mood during her follow-up encounter.  Collaboration of Care: Collaboration of Care: Medication Management AEB provider managing patient's psychiatric medications and Psychiatrist AEB patient being seen by a mental health provider at this facility  Patient/Guardian was advised Release of Information must be obtained prior to any record release in order to collaborate their care with an outside provider. Patient/Guardian was advised if they have not already done so to contact the registration department to sign all necessary forms in order for [place] to release information regarding their care.   Consent: Patient/Guardian gives verbal consent for treatment and assignment of benefits for services provided during this visit. Patient/Guardian expressed understanding and agreed to proceed.   1. Bipolar 2 disorder (HCC)  - ARIPiprazole (ABILIFY) 30  MG tablet; Take 1 tablet (30 mg total) by mouth daily.  Dispense: 30 tablet; Refill: 2 - escitalopram (LEXAPRO) 10 MG tablet; Take 3 tablets (30 mg total) by mouth daily.  Dispense: 90 tablet; Refill: 2 - lamoTRIgine (LAMICTAL) 100 MG tablet; Take 1 tablet (100 mg total) by mouth daily.  Dispense: 30 tablet; Refill: 2  2. Generalized anxiety disorder with panic attacks  - escitalopram (LEXAPRO) 10 MG tablet; Take 3 tablets (30 mg total) by mouth daily.  Dispense: 90 tablet; Refill: 2 - clonazePAM (KLONOPIN) 1 MG tablet; Take 0.5 tablets (0.5 mg total) by mouth 2 (two) times daily as needed for anxiety.  Dispense: 30 tablet; Refill: 1  Patient to follow up in 2 months Provider spent a total of 30 minutes with the patient/reviewing the patient's chart  Malachy Mood, PA 07/22/2022, 9:04 AM

## 2022-08-04 ENCOUNTER — Other Ambulatory Visit: Payer: Self-pay

## 2022-08-05 ENCOUNTER — Ambulatory Visit: Payer: Self-pay | Admitting: Physician Assistant

## 2022-08-05 ENCOUNTER — Other Ambulatory Visit: Payer: Self-pay

## 2022-08-10 ENCOUNTER — Other Ambulatory Visit: Payer: Self-pay

## 2022-08-17 ENCOUNTER — Other Ambulatory Visit: Payer: Self-pay

## 2022-08-18 ENCOUNTER — Other Ambulatory Visit: Payer: Self-pay

## 2022-08-20 ENCOUNTER — Other Ambulatory Visit (HOSPITAL_BASED_OUTPATIENT_CLINIC_OR_DEPARTMENT_OTHER): Payer: Self-pay

## 2022-08-20 ENCOUNTER — Encounter (HOSPITAL_BASED_OUTPATIENT_CLINIC_OR_DEPARTMENT_OTHER): Payer: Self-pay | Admitting: *Deleted

## 2022-08-20 ENCOUNTER — Other Ambulatory Visit: Payer: Self-pay

## 2022-08-20 ENCOUNTER — Emergency Department (HOSPITAL_BASED_OUTPATIENT_CLINIC_OR_DEPARTMENT_OTHER)
Admission: EM | Admit: 2022-08-20 | Discharge: 2022-08-20 | Disposition: A | Discharge status: Home / Self Care | Payer: Self-pay | Attending: Emergency Medicine | Admitting: Emergency Medicine

## 2022-08-20 DIAGNOSIS — L739 Follicular disorder, unspecified: Secondary | ICD-10-CM | POA: Insufficient documentation

## 2022-08-20 DIAGNOSIS — B029 Zoster without complications: Secondary | ICD-10-CM | POA: Insufficient documentation

## 2022-08-20 DIAGNOSIS — Z794 Long term (current) use of insulin: Secondary | ICD-10-CM | POA: Insufficient documentation

## 2022-08-20 MED ORDER — MUPIROCIN 2 % EX OINT
1.0000 | TOPICAL_OINTMENT | Freq: Two times a day (BID) | CUTANEOUS | 0 refills | Status: AC
  Filled 2022-08-20: qty 15, 8d supply, fill #0
  Filled 2022-08-20: qty 22, 11d supply, fill #0

## 2022-08-20 MED ORDER — ACYCLOVIR 5 % EX OINT
1.0000 | TOPICAL_OINTMENT | CUTANEOUS | 0 refills | Status: AC
  Filled 2022-08-20: qty 5, fill #0
  Filled 2022-08-20: qty 15, 30d supply, fill #0

## 2022-08-20 NOTE — ED Provider Notes (Addendum)
[place] EMERGENCY DEPARTMENT AT [facility] Provider Note   CSN: [id number] Arrival date & time: 08/20/22  1402     History  Chief Complaint  Patient presents with   Herpes Zoster    MARGARETMARY PRISK is a 47 y.o. female.  Patient is a 47 year old female who presents with a genital herpes outbreak.  She says it started about a month ago and she is on valacyclovir 500 mg daily as preventative measure.  About 2 to 3 weeks ago, she increased it to the 1000 mg to treat the outbreak.  She says it has not gotten any better and she request some antiviral ointment to use on the areas she has had good experience with that in the past.  She also has a small wound to her left thigh.  She says it started as a little pimple and she opened it and a little pus came out.  No fevers.  No vaginal discharge.  She says she has a primary care doctor but she is unable to see them due to financial reasons.       Home Medications Prior to Admission medications   Medication Sig Start Date End Date Taking? Authorizing Provider  acyclovir cream (ZOVIRAX) 5 % Apply 1 Application topically every 4 (four) hours for 3 days. 08/20/22 08/23/22 Yes Rolan Bucco, MD  mupirocin cream (BACTROBAN) 2 % Apply 1 Application topically 2 (two) times daily. 08/20/22  Yes Rolan Bucco, MD  ARIPiprazole (ABILIFY) 30 MG tablet Take 1 tablet (30 mg total) by mouth daily. 07/22/22   Nwoko, Tommas Olp, PA  azithromycin (ZITHROMAX) 250 MG tablet Take 1 tablet (250 mg total) by mouth daily. 04/17/22   Geoffery Lyons, MD  Blood Glucose Monitoring Suppl (TRUE METRIX METER) w/Device KIT use kit to check blood glucose 3 three times daily before meals. 03/31/21   Hoy Register, MD  clonazePAM (KLONOPIN) 1 MG tablet Take 0.5 tablets (0.5 mg total) by mouth 2 (two) times daily as needed for anxiety. 07/22/22   Nwoko, Tommas Olp, PA  escitalopram (LEXAPRO) 10 MG tablet Take 3 tablets (30 mg total) by mouth daily. 07/22/22   Nwoko,  Tommas Olp, PA  fenofibrate (TRICOR) 48 MG tablet Take 1 tablet (48 mg total) by mouth daily. 01/01/22   Anders Simmonds, PA-C  gabapentin (NEURONTIN) 300 MG capsule Take 2 capsules (600 mg total) by mouth 3 (three) times daily. 01/01/22 07/09/22  Anders Simmonds, PA-C  glucose blood (TRUE METRIX BLOOD GLUCOSE TEST) test strip Use 1 test strip to check blood glucose 3 times daily 06/09/22   Hoy Register, MD  guaiFENesin-codeine 100-10 MG/5ML syrup Take 10 mLs by mouth every 6 (six) hours as needed for cough. 04/17/22   Geoffery Lyons, MD  hydrochlorothiazide (HYDRODIURIL) 12.5 MG tablet Take 1 tablet (12.5 mg total) by mouth daily. 04/01/22   Hoy Register, MD  hydrOXYzine (ATARAX) 25 MG tablet Take 1 tablet (25 mg total) by mouth 3 (three) times daily as needed. 04/13/22   Karsten Ro, MD  insulin aspart (NOVOLOG FLEXPEN) 100 UNIT/ML FlexPen Inject 0-12 Units into the skin 3 (three) times daily with meals. As per sliding scale 01/01/22   Anders Simmonds, PA-C  insulin glargine (LANTUS) 100 UNIT/ML injection Inject 0.43 mLs (43 Units total) into the skin 2 (two) times daily. 04/01/22   Hoy Register, MD  Insulin Syringe-Needle U-100 (TRUEPLUS INSULIN SYRINGE) 31G X 5/16" 1 ML MISC Use to inject Lantus twice a day. 01/01/22   McClung,  Marzella Schlein, PA-C  lamoTRIgine (LAMICTAL) 100 MG tablet Take 1 tablet (100 mg total) by mouth daily. 07/22/22   Nwoko, Tommas Olp, PA  meclizine (ANTIVERT) 25 MG tablet Take 1 tablet (25 mg total) by mouth 3 (three) times daily as needed for dizziness. 02/04/22   Melene Plan, DO  metFORMIN (GLUCOPHAGE-XR) 500 MG 24 hr tablet Take one daily with food for one week then two daily with food Patient taking differently: Take 500 mg by mouth 2 (two) times daily with a meal. 01/01/22   McClung, Marzella Schlein, PA-C  rosuvastatin (CRESTOR) 20 MG tablet Take 1 tablet (20 mg total) by mouth daily. 01/01/22   Anders Simmonds, PA-C  Semaglutide, 2 MG/DOSE, 8 MG/3ML SOPN Inject 2 mg as directed  once a week. 01/01/22   Anders Simmonds, PA-C  sucralfate (CARAFATE) 1 g tablet Take 1 tablet (1 g total) by mouth 4 (four) times daily -  with meals and at bedtime for 14 days. 05/25/22 06/08/22  Curatolo, Adam, DO  TRUEplus Lancets 28G MISC use 1 lancet to check blood glucose 3 times daily before meals. 03/31/21   Hoy Register, MD  valACYclovir (VALTREX) 500 MG tablet Take 1 tablet (500 mg total) by mouth daily for prophylaxis. If out break occurs take 1 tablet twice a day x 5 days as needed for outbreak 01/01/22   Anders Simmonds, PA-C  citalopram (CELEXA) 40 MG tablet Take 40 mg by mouth daily.  11/25/20  [provider]      Allergies    Dilaudid [hydromorphone], Compazine [prochlorperazine], Reglan [metoclopramide], and Zofran [ondansetron]    Review of Systems   Review of Systems  Constitutional:  Negative for chills, diaphoresis, fatigue and fever.  HENT:  Negative for congestion, rhinorrhea and sneezing.   Eyes: Negative.   Respiratory:  Negative for cough, chest tightness and shortness of breath.   Cardiovascular:  Negative for chest pain and leg swelling.  Gastrointestinal:  Negative for abdominal pain, blood in stool, diarrhea, nausea and vomiting.  Genitourinary:  Positive for vaginal pain. Negative for difficulty urinating, flank pain, frequency, hematuria, vaginal bleeding and vaginal discharge.  Musculoskeletal:  Negative for arthralgias and back pain.  Skin:  Positive for wound. Negative for rash.  Neurological:  Negative for dizziness, speech difficulty, weakness, numbness and headaches.    Physical Exam Updated Vital Signs BP (!) 146/72 (BP Location: Right Arm)   Pulse 84   Temp 98.4 F (36.9 C)   Resp 18   SpO2 99%  Physical Exam Constitutional:      Appearance: She is well-developed.  HENT:     Head: Normocephalic and atraumatic.  Eyes:     Pupils: Pupils are equal, round, and reactive to light.  Cardiovascular:     Rate and Rhythm: Normal rate.   Pulmonary:     Effort: Pulmonary effort is normal. No respiratory distress.  Abdominal:     Tenderness: There is no abdominal tenderness.  Genitourinary:    Comments: There are 3-4 small ulcerated lesions to her vulva.  There is no obvious discharge.  No redness or surrounding signs of infection. Her assigned RN was a chaperone for exam. Musculoskeletal:        General: Normal range of motion.     Cervical back: Normal range of motion and neck supple.  Lymphadenopathy:     Cervical: No cervical adenopathy.  Skin:    General: Skin is warm and dry.     Comments: Small less than 1  cm papule to the anterior surface of her left thigh.  It is scabbed over.  There is minimal erythema.  No surrounding signs of cellulitis.  Neurological:     Mental Status: She is alert and oriented to person, place, and time.     ED Results / Procedures / Treatments   Labs (all labs ordered are listed, but only abnormal results are displayed) Labs Reviewed - No data to display  EKG None  Radiology No results found.  Procedures Procedures    Medications Ordered in ED Medications - No data to display  ED Course/ Medical Decision Making/ A&P                             Medical Decision Making Risk Prescription drug management.   Patient presents really for treatment of her genital herpes.  She has been on valacyclovir 1000 mg daily for the last 2 weeks or more.  She says she has had good effect with the antibiotic ointment.  I do not feel that it would be beneficial to continue taking the valacyclovir 1000 mg tablets.  I advised her to go back to the 500 mg and we will prescribe her the acyclovir ointment.  The outbreak does not look super significant.  I only see a few little ulcerated lesions.  However symptomatically does seem to be bothering her a lot so we will try valacyclovir ointment.  She also has a little papule that she had previously expressed some pus from.  There is no induration or  fluctuance around it.  No cellulitis.  Will start mupirocin ointment.  Will give her referral to follow-up with the women's outpatient clinic.  Return precautions were given.  Final Clinical Impression(s) / ED Diagnoses Final diagnoses:  Herpes zoster without complication  Folliculitis    Rx / DC Orders ED Discharge Orders          Ordered    acyclovir cream (ZOVIRAX) 5 %  Every 4 hours        08/20/22 1623    mupirocin cream (BACTROBAN) 2 %  2 times daily        08/20/22 1623              Rolan Bucco, MD 08/20/22 1633    Rolan Bucco, MD 08/20/22 1635

## 2022-08-20 NOTE — ED Triage Notes (Signed)
Pt has genital herpes and is on acyclovir for it but has a very painful outbreak that is not controlled by her medication.  Pt also has a spot on her left shin that started out like a pimple/ingrown hair but it has gotten larger and is oozing.

## 2022-08-20 NOTE — Discharge Instructions (Signed)
Use the mupiricin ointment to the wound on your leg.  Use the acyclovir ointment to your genital herpes lesions.  Go back to your normal preventative dose of Valtrex.  Follow-up with the women's outpatient clinic if your symptoms are not improving.  Return to emergency room if you have any worsening symptoms.

## 2022-08-27 ENCOUNTER — Other Ambulatory Visit: Payer: Self-pay

## 2022-08-31 ENCOUNTER — Other Ambulatory Visit: Payer: Self-pay

## 2022-09-02 ENCOUNTER — Other Ambulatory Visit: Payer: Self-pay

## 2022-09-02 ENCOUNTER — Other Ambulatory Visit: Payer: Self-pay | Admitting: Pharmacist

## 2022-09-02 ENCOUNTER — Other Ambulatory Visit: Payer: Self-pay | Admitting: Family Medicine

## 2022-09-02 DIAGNOSIS — Z794 Long term (current) use of insulin: Secondary | ICD-10-CM

## 2022-09-02 MED ORDER — INSULIN GLARGINE 100 UNIT/ML ~~LOC~~ SOLN
43.0000 [IU] | Freq: Two times a day (BID) | SUBCUTANEOUS | 0 refills | Status: DC
  Filled 2022-09-02: qty 20, 23d supply, fill #0

## 2022-09-02 MED ORDER — LANTUS SOLOSTAR 100 UNIT/ML ~~LOC~~ SOPN
43.0000 [IU] | PEN_INJECTOR | Freq: Two times a day (BID) | SUBCUTANEOUS | 3 refills | Status: AC
  Filled 2022-09-02: qty 30, 35d supply, fill #0
  Filled 2022-09-04: qty 90, 105d supply, fill #1

## 2022-09-02 NOTE — Telephone Encounter (Signed)
Requested medication (s) are due for refill today: yes  Requested medication (s) are on the active medication list: yes  Last refill:  04/01/22  Future visit scheduled: no  Notes to clinic:  Unable to refill per protocol due to failed labs, no updated A1c results.      Requested Prescriptions  Pending Prescriptions Disp Refills   insulin glargine (LANTUS) 100 UNIT/ML injection 20 mL 3    Sig: Inject 0.43 mLs (43 Units total) into the skin 2 (two) times daily.     Endocrinology:  Diabetes - Insulins Failed - 09/02/2022  1:00 PM      Failed - HBA1C is between 0 and 7.9 and within 180 days    HbA1c, POC (controlled diabetic range)  Date Value Ref Range Status  04/01/2022 8.1 (A) 0.0 - 7.0 % Final         Passed - Valid encounter within last 6 months    Recent Outpatient Visits           5 months ago Type 2 diabetes mellitus with other specified complication, with long-term current use of insulin (HCC)   [place] [facility] [place], [place], [place]   8 months ago Type 2 diabetes mellitus with hyperglycemia, with long-term current use of insulin [facility])   [place] [facility] [place], [place], [place]   1 year ago Type 2 diabetes mellitus with hyperglycemia, with long-term current use of insulin [facility])   [place] [facility] Hoy Register, MD   1 year ago Diabetes mellitus without complication [facility])   [place] [facility] Storm Frisk, MD   1 year ago Type 2 diabetes mellitus with other specified complication, with long-term current use of insulin [facility])   [facility] [place], Cornelius Moras, RPH-CPP

## 2022-09-03 ENCOUNTER — Other Ambulatory Visit: Payer: Self-pay

## 2022-09-04 ENCOUNTER — Other Ambulatory Visit: Payer: Self-pay

## 2022-09-09 IMAGING — CR DG CHEST 2V
2 series · 2 of 2 positions shown · non-contrast
Comparison: Portable chest 10/09/2012.

CLINICAL DATA: 45-year-old female with new onset chest pain, left
jaw pain since midnight.

EXAM:
CHEST - 2 VIEW

[w chest pa]
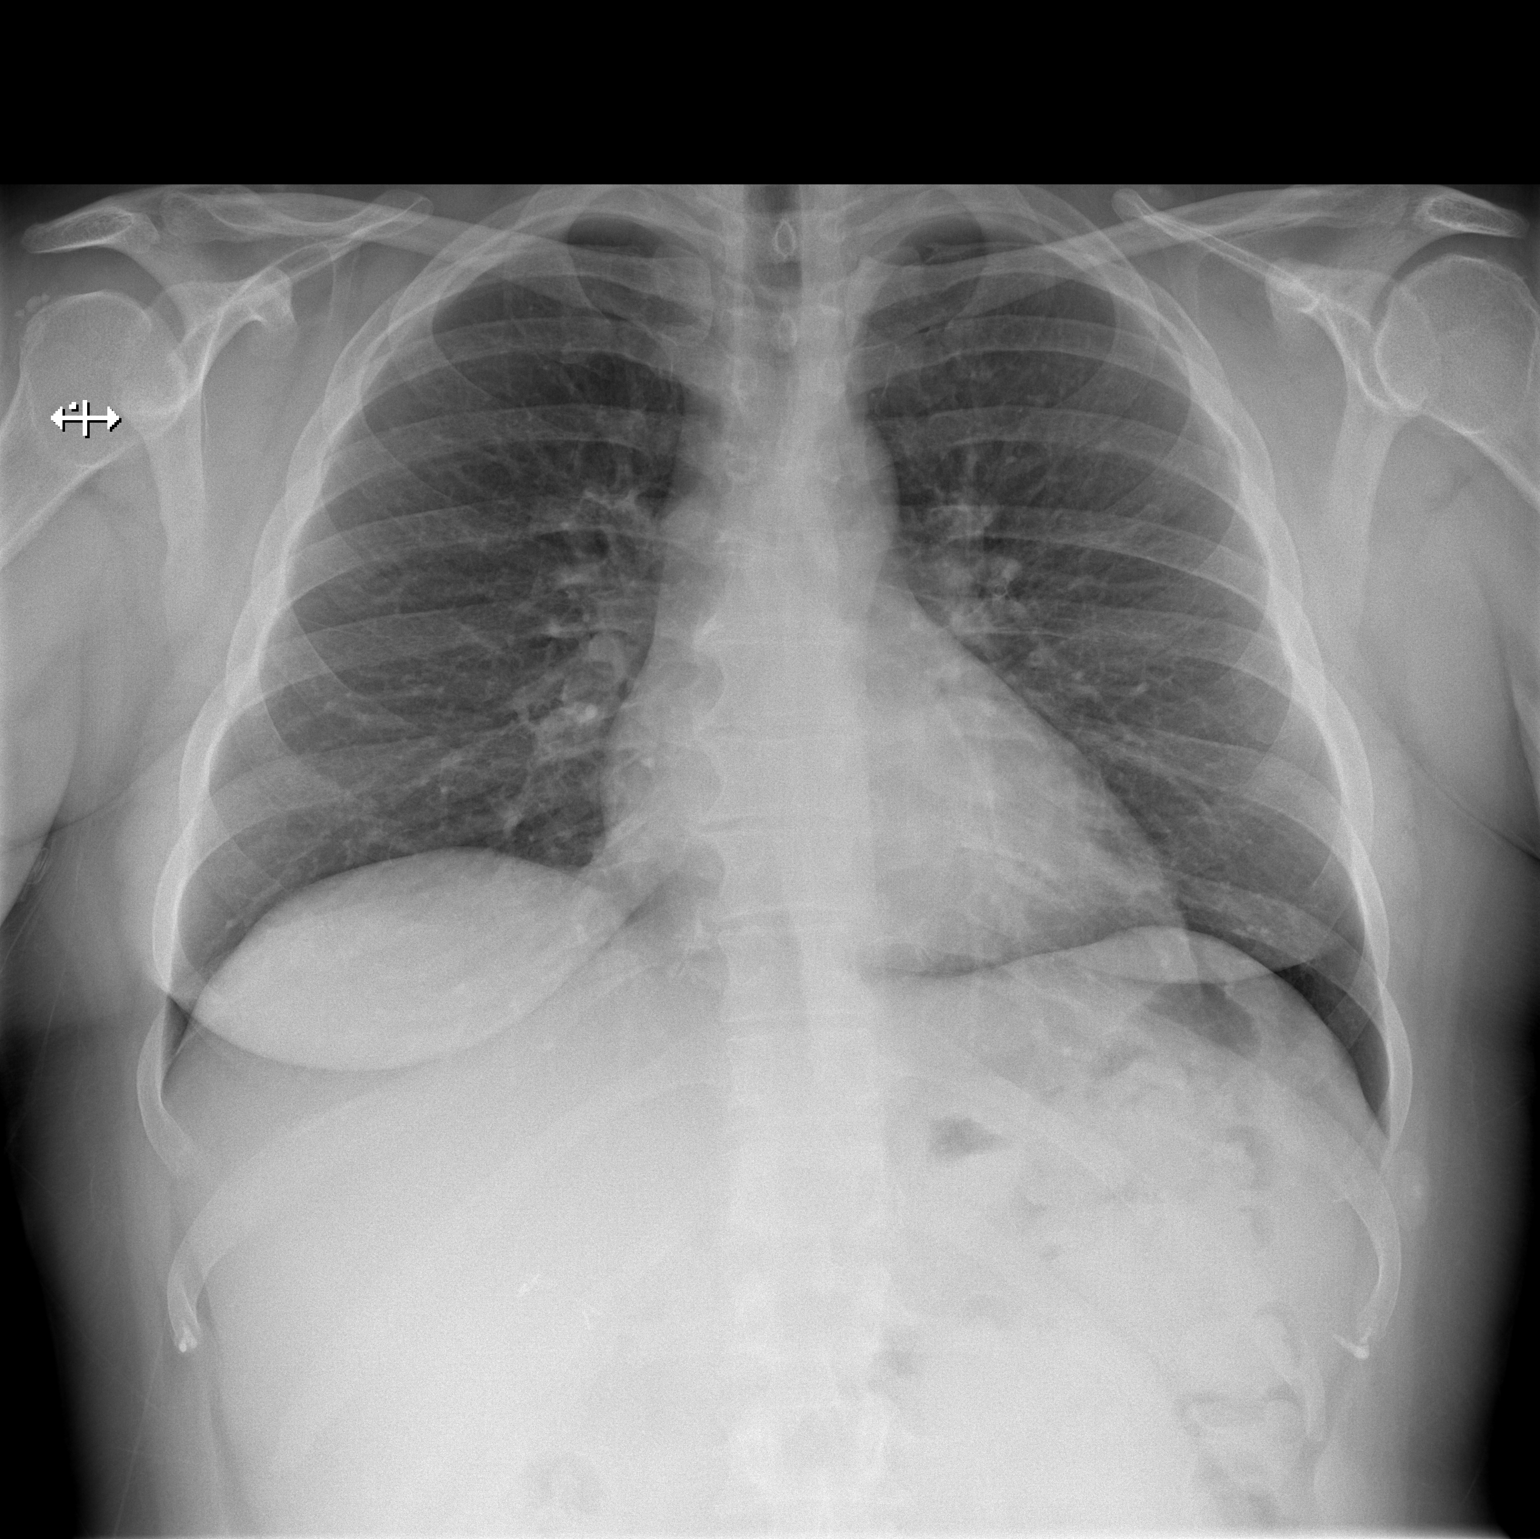

[w chest lat]
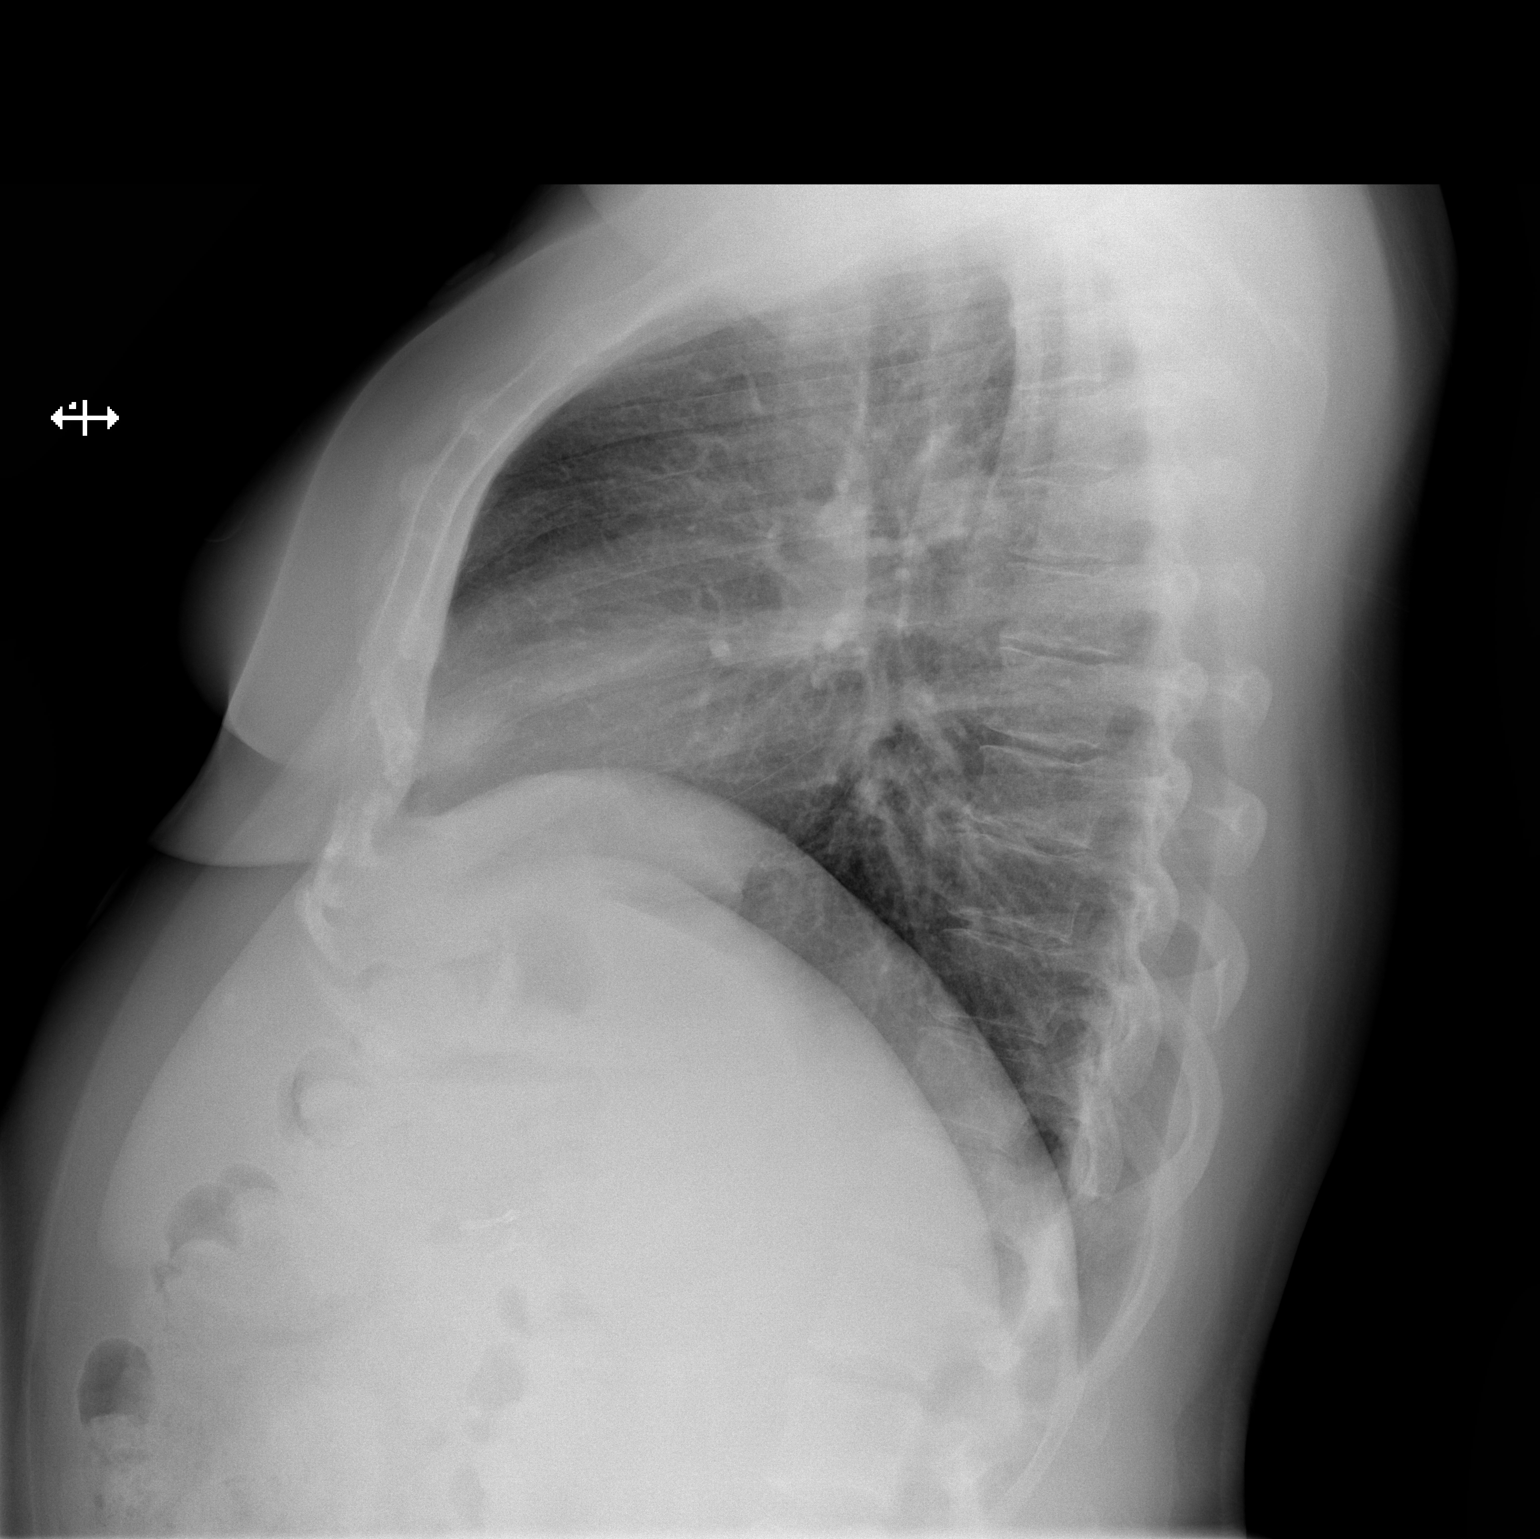

[2 of 2 positions shown; findings below may reference images not displayed]

FINDINGS: Lung volumes and mediastinal contours remain normal. Visualized
tracheal air column is within normal limits. Both lungs appear
clear. No pneumothorax or pleural effusion.

Cholecystectomy clips in the right upper quadrant. Negative visible
bowel gas pattern. No acute osseous abnormality identified. Mild
dystrophic calcifications adjacent to the right humeral head.
IMPRESSION: Negative.  No cardiopulmonary abnormality.

## 2022-09-24 ENCOUNTER — Encounter (HOSPITAL_COMMUNITY): Payer: No Payment, Other | Admitting: Physician Assistant

## 2022-10-12 ENCOUNTER — Other Ambulatory Visit: Payer: Self-pay

## 2022-11-13 ENCOUNTER — Other Ambulatory Visit (HOSPITAL_BASED_OUTPATIENT_CLINIC_OR_DEPARTMENT_OTHER): Payer: Self-pay

## 2023-04-26 IMAGING — DX DG CHEST 2V
2 series · 2 of 2 positions shown · non-contrast
Comparison: March 04, 2021.

CLINICAL DATA: Shortness of breath, cough.

EXAM:
CHEST - 2 VIEW

[chest pa]
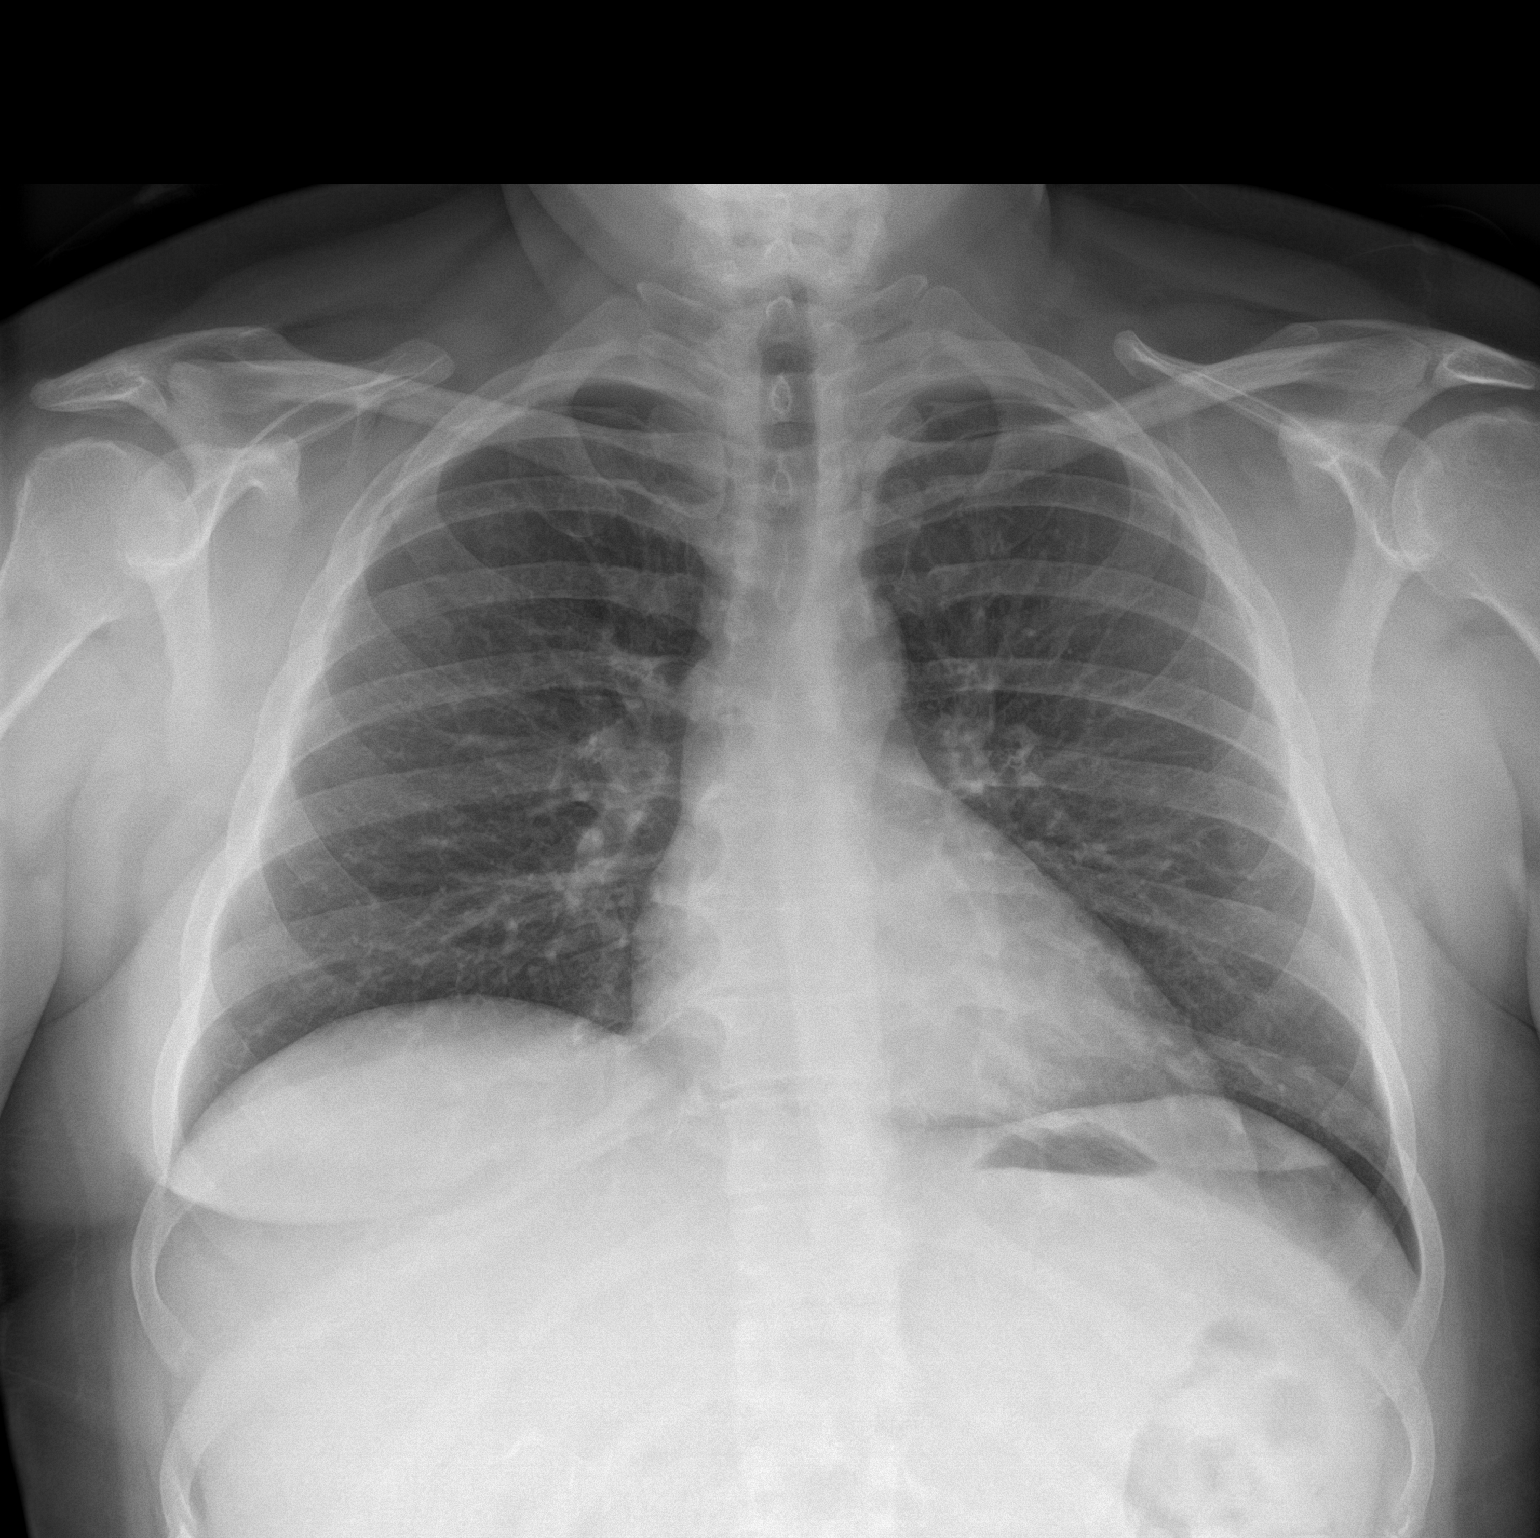

[chest lat]
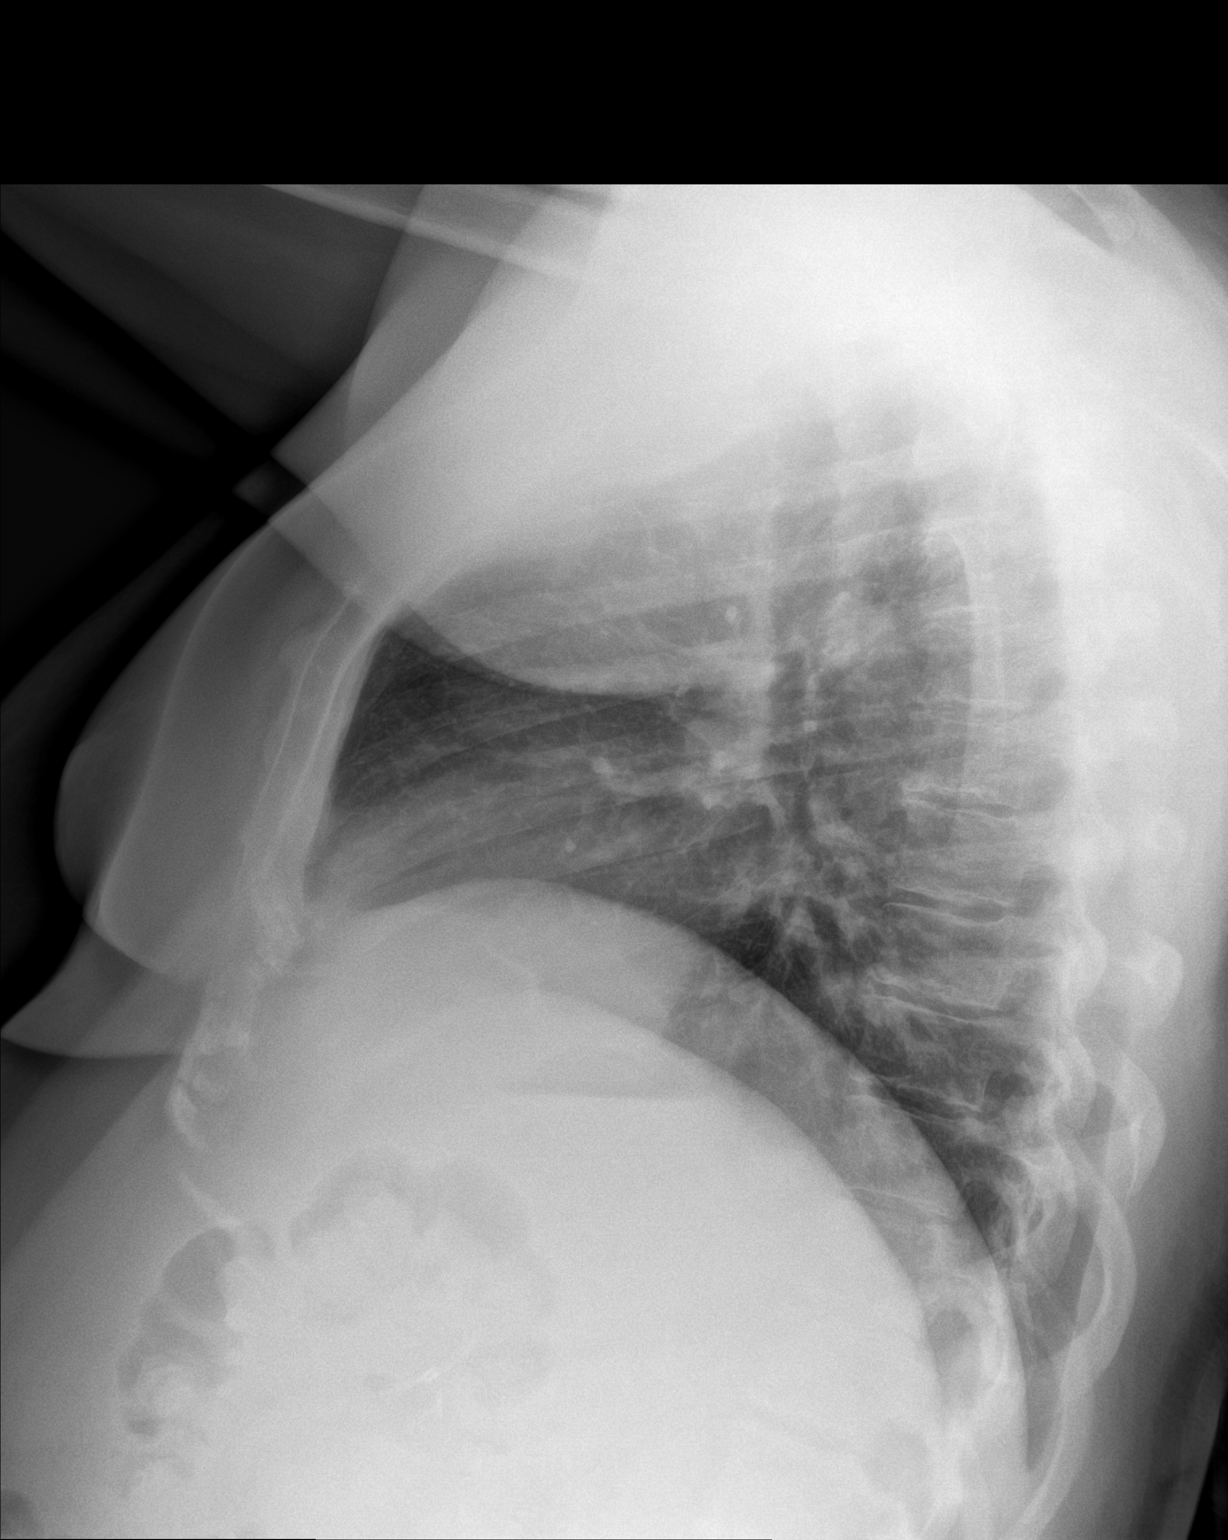

[2 of 2 positions shown; findings below may reference images not displayed]

FINDINGS: The heart size and mediastinal contours are within normal limits.
Both lungs are clear. The visualized skeletal structures are
unremarkable.
IMPRESSION: No active cardiopulmonary disease.

## 2023-08-18 IMAGING — CT CT HEAD W/O CM
4 series · 17 of 47 positions shown, 19 images · non-contrast
Comparison: None.

CLINICAL DATA: TIA, paresthesias



[Series 2: head wo · axial · 0.39mm/px · z∈[+20,+135]mm · 7 of 31 slices shown, 9 images]
[im 4/31  brain]
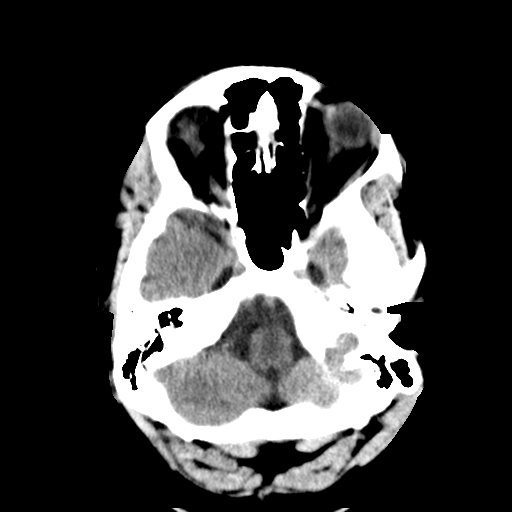
[im 4/31  bone]
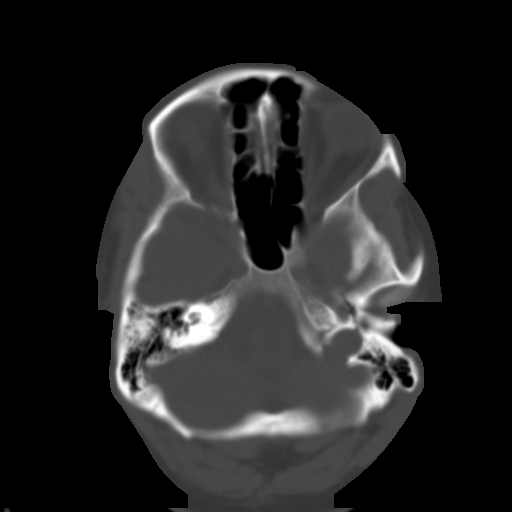
[im 8/31  brain]
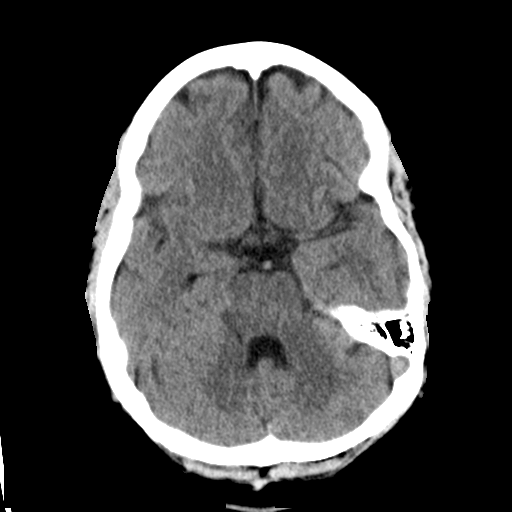
[im 12/31  brain]
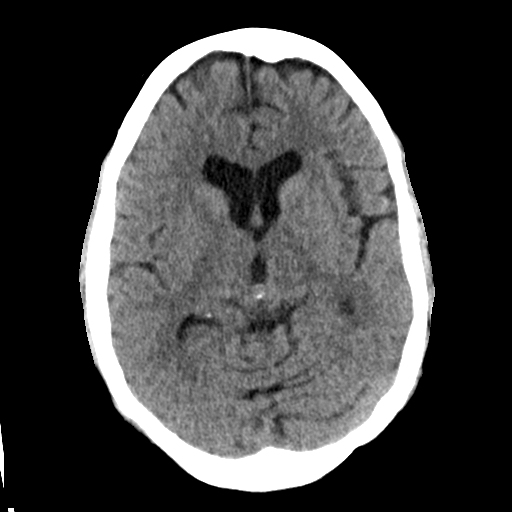
[im 16/31  brain]
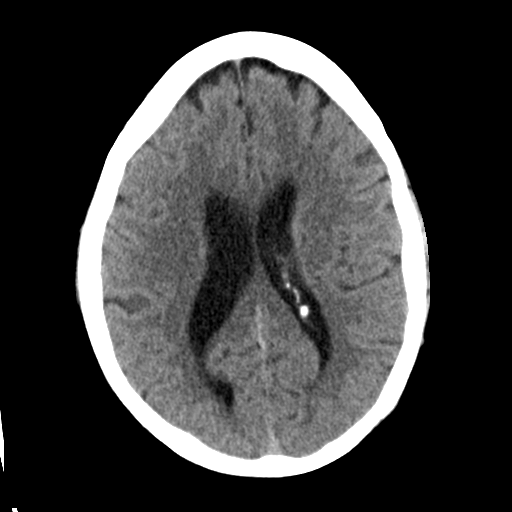
[im 19/31  brain]
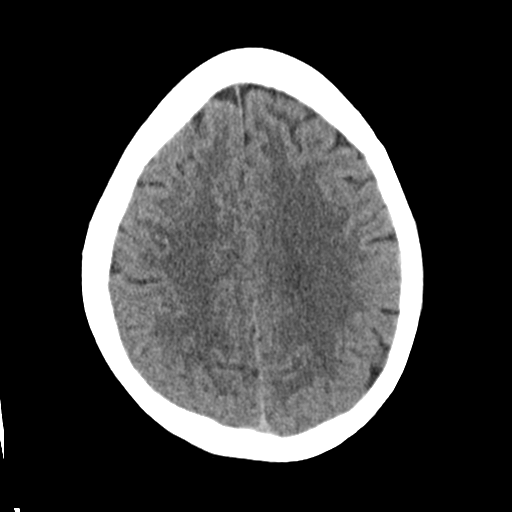
[im 19/31  bone]
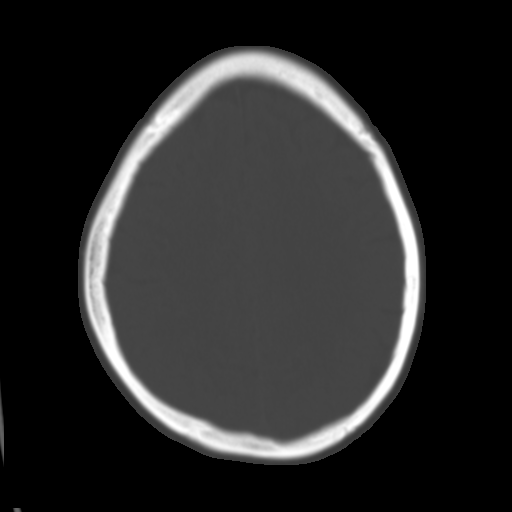
[im 23/31  brain]
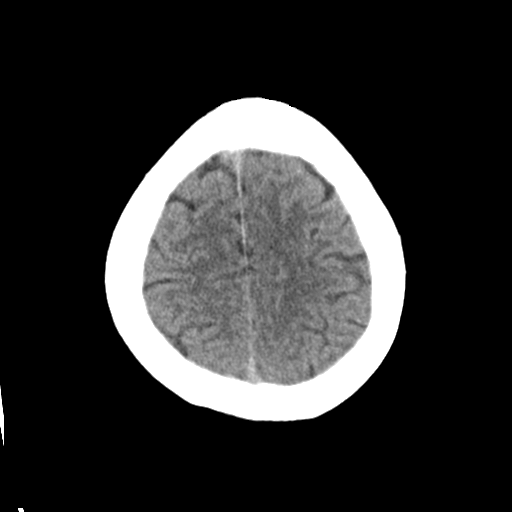
[im 27/31  brain]
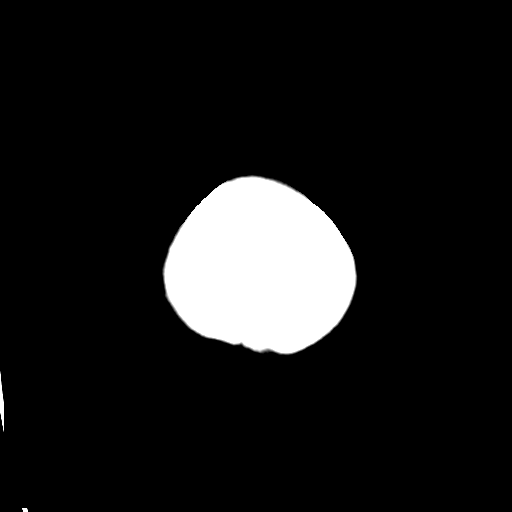

[Series 3: head bone · axial · 0.39mm/px · z∈[+19,+71]mm · 4 of 76 slices shown]
[im 8/76  bone]
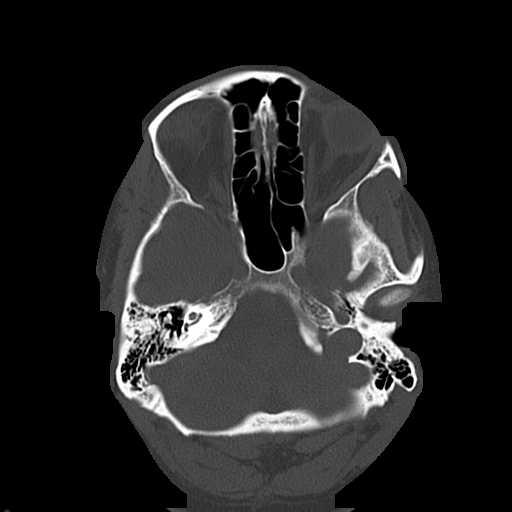
[im 16/76  bone]
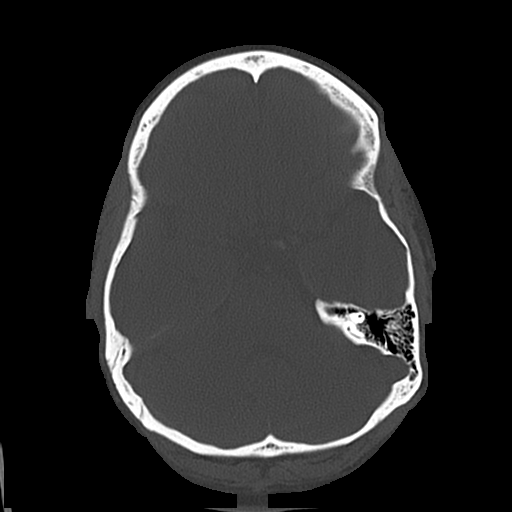
[im 23/76  bone]
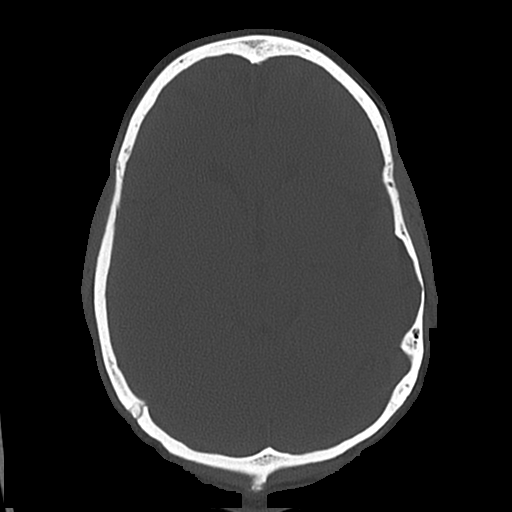
[im 34/76  bone]
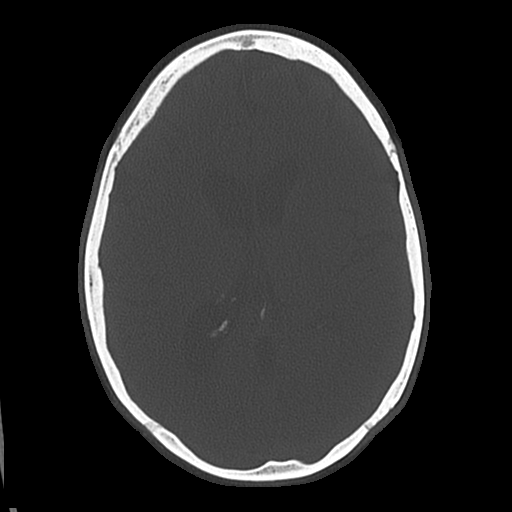

[Series 4: coronal soft · coronal · 0.30mm/px · 3 of 64 slices shown]
[im 22/64  brain]
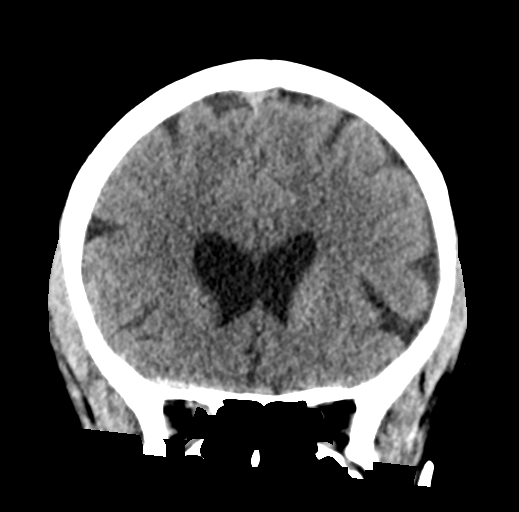
[im 29/64  brain]
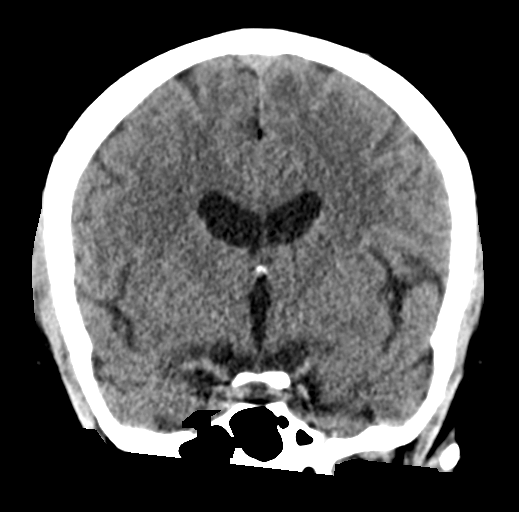
[im 36/64  brain]
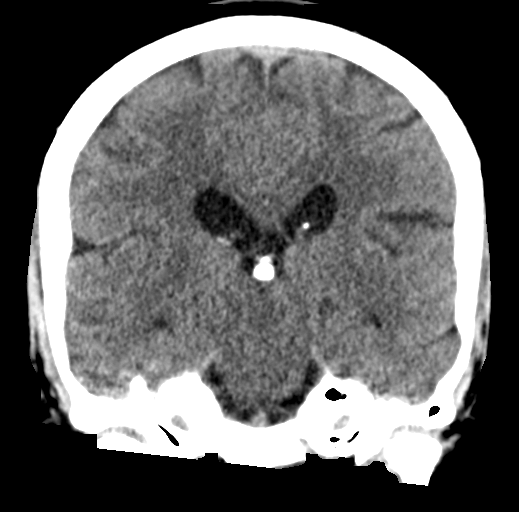

[Series 5: sagittal soft · sagittal · 0.30mm/px · 3 of 53 slices shown]
[im 18/53  brain]
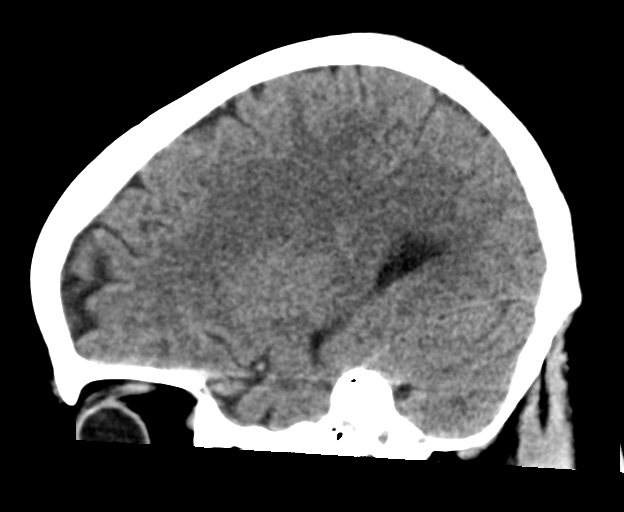
[im 27/53  brain]
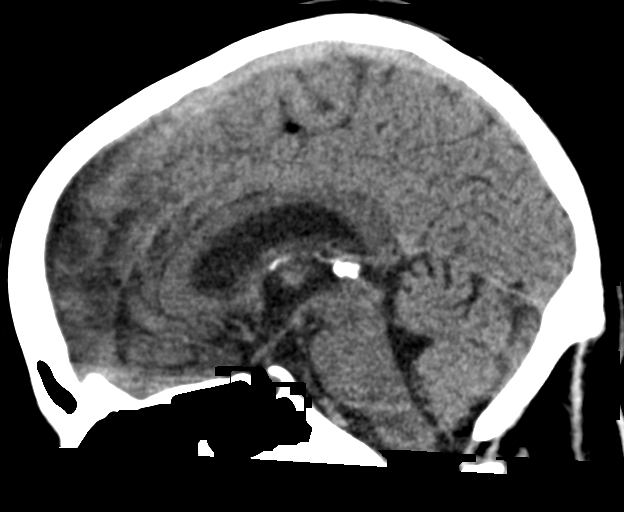
[im 35/53  brain]
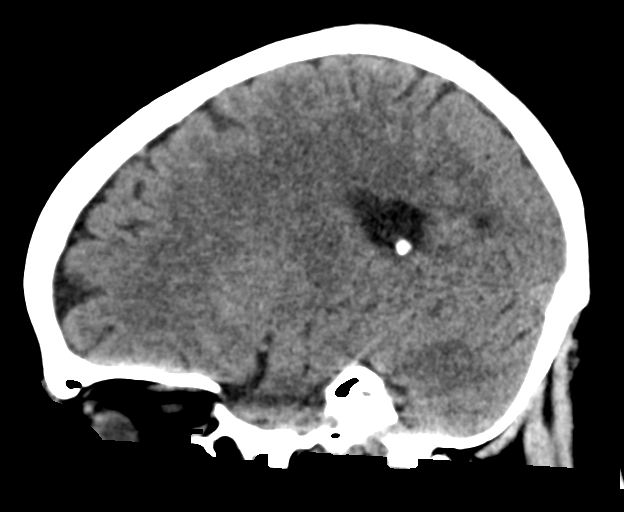

[17 of 47 positions shown; findings below may reference images not displayed]

FINDINGS: Brain: No acute intracranial hemorrhage, mass effect, or herniation.
No extra-axial fluid collections. No evidence of acute territorial
infarct. No hydrocephalus.

Vascular: No hyperdense vessel or unexpected calcification.

Skull: Normal. Negative for fracture or focal lesion.

Sinuses/Orbits: No acute finding.

Other: None.
IMPRESSION: No acute intracranial process identified.

## 2023-10-12 ENCOUNTER — Emergency Department: Admit: 2023-10-12 | Payer: Medicaid (Managed Care) | Primary: Nurse Practitioner

## 2023-10-12 ENCOUNTER — Inpatient Hospital Stay
Admit: 2023-10-12 | Discharge: 2023-10-12 | Disposition: A | Discharge status: Home / Self Care | Payer: Medicaid (Managed Care) | Attending: Emergency Medicine

## 2023-10-12 DIAGNOSIS — R1032 Left lower quadrant pain: Secondary | ICD-10-CM

## 2023-10-12 LAB — PREGNANCY, URINE: Pregnancy, Urine: NEGATIVE

## 2023-10-12 LAB — URINALYSIS WITH REFLEX TO CULTURE
Bilirubin, Urine: NEGATIVE
Glucose, Ur: NEGATIVE mg/dL
Ketones, Urine: NEGATIVE mg/dL
Leukocyte Esterase, Urine: NEGATIVE
Nitrite, Urine: NEGATIVE
Protein, UA: NEGATIVE mg/dL
Specific Gravity, UA: 1.02 (ref 1.005–1.030)
Urine Hgb: NEGATIVE
Urobilinogen, Urine: NORMAL EU/dL (ref 0.0–1.0)
pH, Urine: 6 (ref 5.0–8.0)

## 2023-10-12 LAB — COMPREHENSIVE METABOLIC PANEL
ALT: 17 U/L (ref 10–35)
AST: 16 U/L (ref 10–35)
Albumin/Globulin Ratio: 1.4
Albumin: 4.2 g/dL (ref 3.5–5.2)
Alkaline Phosphatase: 104 U/L (ref 35–104)
Anion Gap: 12 mmol/L (ref 9–16)
BUN: 12 mg/dL (ref 6–20)
CO2: 23 mmol/L (ref 20–31)
Calcium: 9.3 mg/dL (ref 8.6–10.4)
Chloride: 100 mmol/L (ref 98–107)
Creatinine: 0.7 mg/dL (ref 0.5–0.9)
Est, Glom Filt Rate: >90 mL/min/{1.73_m2} (ref >60)
Glucose: 142 mg/dL — ABNORMAL HIGH (ref 74–99)
Potassium: 3.9 mmol/L (ref 3.7–5.3)
Sodium: 135 mmol/L — ABNORMAL LOW (ref 136–145)
Total Bilirubin: 0.3 mg/dL (ref 0.0–1.2)
Total Protein: 7.1 g/dL (ref 6.6–8.7)

## 2023-10-12 LAB — CBC WITH AUTO DIFFERENTIAL
Basophils %: 1 % (ref 0–2)
Basophils Absolute: 0.1 10*3/uL (ref 0.0–0.2)
Eosinophils %: 2 % (ref 1–4)
Eosinophils Absolute: 0.1 10*3/uL (ref 0.0–0.4)
Hematocrit: 37.1 % (ref 36–46)
Hemoglobin: 12.9 g/dL (ref 12.0–16.0)
Lymphocytes %: 28 % (ref 24–44)
Lymphocytes Absolute: 2 10*3/uL (ref 1.0–4.8)
MCH: 30.9 pg (ref 26–34)
MCHC: 34.8 g/dL (ref 31–37)
MCV: 89 fL (ref 80–100)
MPV: 7.1 fL (ref 6.0–12.0)
Monocytes %: 6 % (ref 2–11)
Monocytes Absolute: 0.5 10*3/uL (ref 0.1–1.2)
Neutrophils %: 63 % (ref 36–66)
Neutrophils Absolute: 4.5 10*3/uL (ref 1.8–7.7)
Platelets: 393 10*3/uL (ref 140–450)
RBC: 4.17 m/uL (ref 4.0–5.2)
RDW: 12.8 % (ref 12.5–15.4)
WBC: 7.2 10*3/uL (ref 3.5–11.0)

## 2023-10-12 LAB — LIPASE: Lipase: 46 U/L (ref 13–60)

## 2023-10-12 MED ORDER — DICYCLOMINE HCL 10 MG PO CAPS
10 | Freq: Once | ORAL | Status: AC
Start: 2023-10-12 — End: 2023-10-12
  Administered 2023-10-12: 19:00:00 10 mg via ORAL

## 2023-10-12 MED ORDER — DICYCLOMINE HCL 10 MG PO CAPS
10 | ORAL_CAPSULE | Freq: Four times a day (QID) | ORAL | 0 refills | 15.00000 days | Status: AC | PRN
Start: 2023-10-12 — End: 2023-10-17

## 2023-10-12 MED FILL — DICYCLOMINE HCL 10 MG PO CAPS: 10 MG | ORAL | Qty: 1 | Fill #0

## 2023-10-12 NOTE — Discharge Instructions (Signed)
 Avoid eating any spicy food, milk type products or drinks that have caffeine in it.  Take all medications as prescribed.  For pain use ibuprofen (Motrin) or acetaminophen (Tylenol), unless prescribed medications that have acetaminophen in it.  You can take over the counter acetaminophen tablets (1 - 2 tablets of the 500-mg strength every 6 hours) or ibuprofen tablets (2 tablets every 4 hours).    PLEASE RETURN TO THE EMERGENCY DEPARTMENT IMMEDIATELY for worsening symptoms, or if you develop any concerning symptoms such as: high fever not relieved by acetaminophen (Tylenol) and/or ibuprofen (Motrin), chills, shortness of breath, chest pain, persistent nausea and/or vomiting, numbness, weakness or tingling in the arms or legs or change in color of the extremities, changes in mental status, persistent headache, blurry vision.    Return within 8 - 12 hours if you have any of the following: worsening of pain in your abdomen, no food sounds good to you, you continue to vomit, pain goes to your back, have pain in the abdomen when going over a bump in the car or when you jump up and down, develop vaginal bleeding or discharge, inability to urinate, unable to follow up with your physician, or other any other care or concern.

## 2023-10-12 NOTE — ED Provider Notes (Signed)
 [facility] Emergency Department  [address]  [place] [address]  Phone: [phone]      Pt Name: Carolyn Crane  MRN: [id number]  Birthdate 25-May-1975  Date of evaluation: 10/12/23    CHIEF COMPLAINT       Chief Complaint   Patient presents with    Abdominal Pain     Cramping abd and back pain started this AM    Back Pain       PAST MEDICAL HISTORY    has a past medical history of Bipolar 1 disorder (HCC), Bipolar 1 disorder (HCC), Depression, Diabetes mellitus (HCC), Fibromyalgia, Hyperlipidemia, IBS (irritable bowel syndrome), and PCOS (polycystic ovarian syndrome).    SURGICAL HISTORY      has a past surgical history that includes Appendectomy; Cholecystectomy; Tonsillectomy; Foot surgery (Left); and shoulder surgery (Left).    CURRENT MEDICATIONS       Discharge Medication List as of 10/12/2023  2:41 PM        CONTINUE these medications which have NOT CHANGED    Details   famotidine  (PEPCID ) 20 MG tablet Take 1 tablet by mouth 2 times dailyHistorical Med      doxycycline  hyclate (VIBRA -TABS) 100 MG tablet Take 1 tablet by mouth 2 times daily, Disp-20 tablet, R-0Normal      metroNIDAZOLE  (FLAGYL ) 500 MG tablet Take 1 tablet by mouth 2 times daily, Disp-20 tablet, R-0Normal      azithromycin  (ZITHROMAX ) 250 MG tablet Take 1 tablet by mouth daily, Disp-4 tablet, R-0Print      Insulin  Detemir (LEVEMIR SC) Inject 110 Units into the skin dailyHistorical Med      acetaminophen  (TYLENOL ) 500 MG tablet Take 1 tablet by mouth every 6 hours as needed for Pain, Disp-56 tablet, R-0Normal      ibuprofen  (ADVIL ;MOTRIN ) 800 MG tablet Take 1 tablet by mouth every 8 hours as needed for Pain, Disp-42 tablet, R-0Normal      lansoprazole  (PREVACID ) 30 MG delayed release capsule Take 1 capsule by mouth daily, Disp-30 capsule, R-0Print      sucralfate  (CARAFATE ) 1 GM tablet Take 1 tablet by mouth 4 times daily, Disp-120 tablet, R-0Print      furosemide  (LASIX ) 20 MG tablet Take 1 tablet by mouth daily, Disp-5 tablet, R-0Print       aspirin  81 MG EC tablet Take 1 tablet by mouth daily, Disp-30 tablet, R-3Normal      SUMAtriptan  (IMITREX ) 50 MG tablet Take 1 tablet by mouth daily as needed for Migraine, Disp-18 tablet, R-1Normal      glimepiride (AMARYL) 1 MG tablet Take 1 mg by mouth every morning (before breakfast)Historical Med      LORazepam  (ATIVAN ) 0.5 MG tablet Take 1 tablet by mouth 2 times daily as needed for Anxiety.Historical Med      escitalopram  (LEXAPRO ) 10 MG tablet Take 10 mg by mouth daily Historical Med      Liraglutide  (VICTOZA  SC) Inject 1.8 Units into the skin daily Historical Med      atorvastatin  (LIPITOR ) 80 MG tablet Take 80 mg by mouth dailyHistorical Med      fenofibrate  160 MG tablet Take 160 mg by mouth daily Historical Med      Insulin  Degludec (TRESIBA  FLEXTOUCH) 200 UNIT/ML SOPN Inject 100 Units into the skin every morning Historical Med      ARIPiprazole  (ABILIFY ) 15 MG tablet Take 30 mg by mouth daily Historical Med             ALLERGIES  is allergic to dilaudid [hydromorphone], prochlorperazine , reglan [metoclopramide], and zofran  [ondansetron  hcl].    Vitals:    10/12/23 1156 10/12/23 1430   BP: 118/73 129/73   Pulse: 76 78   Resp: 18 16   Temp: 98.1 F (36.7 C)    TempSrc: Oral    SpO2: 97% 99%   Weight: 93 kg (205 lb)    Height: 1.651 m (5' 5)        ED Course as of 10/12/23 1546   Tue Oct 12, 2023   1318 Comprehensive Metabolic Panel(!):    Sodium 135(!)   Potassium 3.9   Chloride 100   CARBON DIOXIDE 23   Anion Gap 12   Glucose 142(!)   BUN,BUNPL 12   Creatinine 0.7   Est, Glom Filt Rate >90   Calcium  9.3   Total Protein 7.1   Albumin 4.2   Albumin/Globulin Ratio 1.4   Total Bilirubin 0.3   Alkaline Phosphatase 104   ALT 17   AST 16 [TS]   1319 Lipase:    Lipase 46 [TS]   1319 PREGNANCY, URINE:    Pregnancy, Urine NEGATIVE [TS]   1319 Urinalysis with Reflex to Culture:    Color, UA Yellow   Turbidity UA Clear   Glucose, Ur NEGATIVE   Bilirubin, Urine NEGATIVE   Ketones, Urine NEGATIVE   Specific  Gravity, UA 1.020   Urine Hgb NEGATIVE   pH, Urine 6.0   Protein, UA NEGATIVE   Urobilinogen Normal   Nitrite, Urine NEGATIVE   Leukocyte Esterase, Urine NEGATIVE   COMMENT, [id number] Microscopic exam not performed based on chemical results unless requested in original order.   COMMENT, [id number]        COMMENT, [id number] Utilizing a urinalysis as the only screening method to exclude a potential uropathogen can be unreliable in many patient populations.  Rapid screening tests are less sensitive than culture and if UTI is a clinical possibility, culture should be considered despite a negative urinalysis.   [TS]   1319 CBC with Diff:    WBC 7.2   RBC 4.17   Hemoglobin Quant 12.9   Hematocrit 37.1   MCV 89.0   MCH 30.9   MCHC 34.8   RDW 12.8   Platelet Count 393   MPV 7.1   Neutrophils % 63   Lymphocyte % 28   Monocytes % 6   Eosinophils % 2   Basophils % 1   Neutrophils Absolute 4.50   Lymphocytes Absolute 2.00   Monocytes Absolute 0.50   Eosinophils Absolute 0.10   Basophils Absolute 0.10 [TS]      ED Course User Index  [TS] Morley Arabia, APRN - CNP       FINAL IMPRESSION      1. Left lower quadrant abdominal pain          DISPOSITION/PLAN   DISPOSITION Decision To Discharge 10/12/2023 02:38:04 PM   DISPOSITION CONDITION Stable             PATIENT REFERRED TO:  Jack Marts, APRN - CNP  [address], [address]  [place] [address]  [phone]    Call in 1 day        DISCHARGE MEDICATIONS:  Discharge Medication List as of 10/12/2023  2:41 PM          Based on the medical record, the care appears appropriate. I was personally available for consultation in the Emergency Department. I did have a discussion with our midlevel provider  regarding the care of this patient.  I reviewed the mid level provider's note and agree with the documented findings and plan of care.   I have reviewed the emergency nurses triage note. I agree with the chief complaint, past medical history, past surgical history, allergies, medications,  social and family history as documented unless otherwise noted below.     CT ABDOMEN PELVIS WO CONTRAST Additional Contrast? None   Final Result   No acute abdominal or pelvic abnormality.               Kerrin Markman, DO  Attending Emergency Physician        Charlee Whitebread M, DO  10/12/23 1546

## 2023-10-12 NOTE — ED Triage Notes (Signed)
entered  In error

## 2023-10-12 NOTE — ED Provider Notes (Signed)
 [facility] EMERGENCY DEPARTMENT  EMERGENCY DEPARTMENT ENCOUNTER      Pt Name: Carolyn Crane  MRN: [id number]  Birthdate 1975/09/02  Date of evaluation: 10/12/2023  Provider: Norman Breen, APRN - CNP  2:36 PM    CHIEF COMPLAINT       Chief Complaint   Patient presents with    Abdominal Pain     Cramping abd and back pain started this AM    Back Pain         HISTORY OF PRESENT ILLNESS    Carolyn Crane is a 48 y.o. female who presents to the emergency department Carolyn Crane is a 48 y.o. female who presents to the emergency department with complaints left lower quadrant pain that started this morning.  Patient denies any recent injury, trauma, falls.  States that she has had feelings of constipation for the past couple days but still passing small soft stools.  Denies urinary complaints.  Denies any chest pain or shortness of breath.  Patient states that she has history of IBS with constipation.  Which is recently moved up.  Has not established with a GI.  Patient denies any nausea or vomiting.  Denies headache or change in vision.    Patient stated that her hopes was to come here and get Bentyl  or prescription for MiraLAX as it is cheaper than over-the-counter cost.  Discussed with patient that I need to do medical evaluation including lab work and imaging in which she is agreeable.     HPI    Nursing Notes were reviewed.    REVIEW OF SYSTEMS       Review of Systems    Except as noted above the remainder of the review of systems was reviewed and negative.       PAST MEDICAL HISTORY     Past Medical History:   Diagnosis Date    Bipolar 1 disorder (HCC)     Bipolar 1 disorder (HCC)     Depression     Diabetes mellitus (HCC)     Fibromyalgia     Hyperlipidemia     IBS (irritable bowel syndrome)     PCOS (polycystic ovarian syndrome)          SURGICAL HISTORY       Past Surgical History:   Procedure Laterality Date    APPENDECTOMY      CHOLECYSTECTOMY      FOOT SURGERY Left     plantar fascitiis release     SHOULDER SURGERY Left     TONSILLECTOMY           CURRENT MEDICATIONS       Previous Medications    ACETAMINOPHEN  (TYLENOL ) 500 MG TABLET    Take 1 tablet by mouth every 6 hours as needed for Pain    ARIPIPRAZOLE  (ABILIFY ) 15 MG TABLET    Take 30 mg by mouth daily     ASPIRIN  81 MG EC TABLET    Take 1 tablet by mouth daily    ATORVASTATIN  (LIPITOR ) 80 MG TABLET    Take 80 mg by mouth daily    AZITHROMYCIN  (ZITHROMAX ) 250 MG TABLET    Take 1 tablet by mouth daily    DOXYCYCLINE  HYCLATE (VIBRA -TABS) 100 MG TABLET    Take 1 tablet by mouth 2 times daily    ESCITALOPRAM  (LEXAPRO ) 10 MG TABLET    Take 10 mg by mouth daily     FAMOTIDINE  (PEPCID ) 20 MG TABLET    Take  1 tablet by mouth 2 times daily    FENOFIBRATE  160 MG TABLET    Take 160 mg by mouth daily     FUROSEMIDE  (LASIX ) 20 MG TABLET    Take 1 tablet by mouth daily    GLIMEPIRIDE (AMARYL) 1 MG TABLET    Take 1 mg by mouth every morning (before breakfast)    IBUPROFEN  (ADVIL ;MOTRIN ) 800 MG TABLET    Take 1 tablet by mouth every 8 hours as needed for Pain    INSULIN  DEGLUDEC (TRESIBA  FLEXTOUCH) 200 UNIT/ML SOPN    Inject 100 Units into the skin every morning     INSULIN  DETEMIR (LEVEMIR SC)    Inject 110 Units into the skin daily    LANSOPRAZOLE  (PREVACID ) 30 MG DELAYED RELEASE CAPSULE    Take 1 capsule by mouth daily    LIRAGLUTIDE  (VICTOZA  SC)    Inject 1.8 Units into the skin daily     LORAZEPAM  (ATIVAN ) 0.5 MG TABLET    Take 1 tablet by mouth 2 times daily as needed for Anxiety.    METRONIDAZOLE  (FLAGYL ) 500 MG TABLET    Take 1 tablet by mouth 2 times daily    SUCRALFATE  (CARAFATE ) 1 GM TABLET    Take 1 tablet by mouth 4 times daily    SUMATRIPTAN  (IMITREX ) 50 MG TABLET    Take 1 tablet by mouth daily as needed for Migraine       ALLERGIES     Dilaudid [hydromorphone], Prochlorperazine , Reglan [metoclopramide], and Zofran  [ondansetron  hcl]    FAMILY HISTORY       Family History   Problem Relation Age of Onset    Cancer Mother     Stroke Mother     Stroke Father      Cancer Maternal Grandmother     Stroke Maternal Grandmother     Cancer Maternal Grandfather           SOCIAL HISTORY       Social History     Socioeconomic History    Marital status: Divorced   Tobacco Use    Smoking status: Never    Smokeless tobacco: Never   Vaping Use    Vaping status: Never Used   Substance and Sexual Activity    Alcohol use: Not Currently     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    Drug use: No    Sexual activity: Yes     Partners: Male     Social Drivers of Health     Food Insecurity: No Food Insecurity (09/06/2023)    Received from [facility]    Hunger Screening     Within the past 12 months we worried whether our food would run out before we got money to buy more.: Never True     Within the past 12 months the food we bought just didn't last and we didn't have money to get more.: Never True   Transportation Needs: No Transportation Needs (05/03/2023)    Received from [facility]    PRAPARE - Transportation     Lack of Transportation (Medical): No     Lack of Transportation (Non-Medical): No   Housing Stability: Low Risk  (05/03/2023)    Received from [facility]    Housing Instability     Are you worried or concerned that in the next two months you may not have stable housing that you own, rent or stay in as a part of a household?: No  SCREENINGS                                               CIWA Assessment  BP: 129/73  Pulse: 78                 PHYSICAL EXAM       ED Triage Vitals [10/12/23 1156]   BP Systolic BP Percentile Diastolic BP Percentile Temp Temp Source Pulse Respirations SpO2   118/73 -- -- 98.1 F (36.7 C) Oral 76 18 97 %      Height Weight - Scale         1.651 m (5' 5) 93 kg (205 lb)             Physical Exam  Vitals and nursing note reviewed.   Constitutional:       General: She is not in acute distress.     Appearance: She is well-developed. She is not toxic-appearing or diaphoretic.   HENT:      Head: Normocephalic and atraumatic.       Right Ear: External ear normal.      Left Ear: External ear normal.      Nose: Nose normal.      Mouth/Throat:      Mouth: Mucous membranes are moist.      Pharynx: Oropharynx is clear.   Eyes:      General: No scleral icterus.        Right eye: No discharge.         Left eye: No discharge.      Extraocular Movements: Extraocular movements intact.      Conjunctiva/sclera: Conjunctivae normal.   Cardiovascular:      Rate and Rhythm: Normal rate and regular rhythm.      Pulses: Normal pulses.      Heart sounds: Normal heart sounds.   Pulmonary:      Effort: Pulmonary effort is normal. No respiratory distress.      Breath sounds: Normal breath sounds.   Abdominal:      General: Bowel sounds are normal. There is no distension.      Palpations: Abdomen is soft. There is no mass (no mass or pulsitile mass).      Tenderness: There is no abdominal tenderness. There is no guarding. Negative signs include Murphy's sign, Rovsing's sign and McBurney's sign.      Hernia: No hernia is present.   Musculoskeletal:         General: No signs of injury. Normal range of motion.      Cervical back: Normal range of motion and neck supple.      Right lower leg: No edema.      Left lower leg: No edema.   Skin:     General: Skin is warm and dry.      Capillary Refill: Capillary refill takes less than 2 seconds.   Neurological:      General: No focal deficit present.      Mental Status: She is alert and oriented to person, place, and time.   Psychiatric:         Mood and Affect: Mood normal.         Behavior: Behavior normal.         DIAGNOSTIC RESULTS     EKG: All EKG's are interpreted by the Emergency Department Physician who  either signs or Co-signs this chart in the absence of a cardiologist.        RADIOLOGY:   Non-plain film images such as CT, Ultrasound and MRI are read by the radiologist. Plain radiographic images are visualized and preliminarily interpreted by the emergency physician with the below findings:        Interpretation  per the Radiologist below, if available at the time of this note:    CT ABDOMEN PELVIS WO CONTRAST Additional Contrast? None   Final Result   No acute abdominal or pelvic abnormality.               ED BEDSIDE ULTRASOUND:   Performed by ED Physician - none    LABS:  Labs Reviewed   COMPREHENSIVE METABOLIC PANEL - Abnormal; Notable for the following components:       Result Value    Sodium 135 (*)     Glucose 142 (*)     All other components within normal limits   CBC WITH AUTO DIFFERENTIAL   LIPASE   URINALYSIS WITH REFLEX TO CULTURE   PREGNANCY, URINE       All other labs were within normal range or not returned as of this dictation.    EMERGENCY DEPARTMENT COURSE and DIFFERENTIAL DIAGNOSIS/MDM:   Vitals:    Vitals:    10/12/23 1156 10/12/23 1430   BP: 118/73 129/73   Pulse: 76 78   Resp: 18 16   Temp: 98.1 F (36.7 C)    TempSrc: Oral    SpO2: 97% 99%   Weight: 93 kg (205 lb)    Height: 1.651 m (5' 5)        Based on patient's presenting symptoms, physical exam and medical history will get a CBC, CMP, lipase, urinalysis with reflex to culture and urine pregnancy will also get a CT abdomen pelvis without contrast.  Lab work was reviewed abnormalities of sodium 135 and glucose 142.  CT image was negative for any acute findings.    Discussed the discharge plan with the patient was agreeable.  Will prescribe Bentyl  1 dose here and to the pharmacy.  Lab patient get established with a GI and follow-up with her primary care doctor.    Medical Decision Making  Amount and/or Complexity of Data Reviewed  Labs: ordered. Decision-making details documented in ED Course.  Radiology: ordered.    Risk  Prescription drug management.            REASSESSMENT     ED Course as of 10/12/23 1436   Tue Oct 12, 2023   1318 Comprehensive Metabolic Panel(!):    Sodium 135(!)   Potassium 3.9   Chloride 100   CARBON DIOXIDE 23   Anion Gap 12   Glucose 142(!)   BUN,BUNPL 12   Creatinine 0.7   Est, Glom Filt Rate >90   Calcium  9.3   Total  Protein 7.1   Albumin 4.2   Albumin/Globulin Ratio 1.4   Total Bilirubin 0.3   Alkaline Phosphatase 104   ALT 17   AST 16 [TS]   1319 Lipase:    Lipase 46 [TS]   1319 PREGNANCY, URINE:    Pregnancy, Urine NEGATIVE [TS]   1319 Urinalysis with Reflex to Culture:    Color, UA Yellow   Turbidity UA Clear   Glucose, Ur NEGATIVE   Bilirubin, Urine NEGATIVE   Ketones, Urine NEGATIVE   Specific Gravity, UA 1.020   Urine Hgb NEGATIVE   pH, Urine 6.0  Protein, UA NEGATIVE   Urobilinogen Normal   Nitrite, Urine NEGATIVE   Leukocyte Esterase, Urine NEGATIVE   COMMENT, [id number] Microscopic exam not performed based on chemical results unless requested in original order.   COMMENT, [id number]        COMMENT, [id number] Utilizing a urinalysis as the only screening method to exclude a potential uropathogen can be unreliable in many patient populations.  Rapid screening tests are less sensitive than culture and if UTI is a clinical possibility, culture should be considered despite a negative urinalysis.   [TS]   1319 CBC with Diff:    WBC 7.2   RBC 4.17   Hemoglobin Quant 12.9   Hematocrit 37.1   MCV 89.0   MCH 30.9   MCHC 34.8   RDW 12.8   Platelet Count 393   MPV 7.1   Neutrophils % 63   Lymphocyte % 28   Monocytes % 6   Eosinophils % 2   Basophils % 1   Neutrophils Absolute 4.50   Lymphocytes Absolute 2.00   Monocytes Absolute 0.50   Eosinophils Absolute 0.10   Basophils Absolute 0.10 [TS]      ED Course User Index  [TS] Arzella Gee, APRN - CNP         CRITICAL CARE TIME     CONSULTS:  None    PROCEDURES:  Unless otherwise noted below, none     Procedures        FINAL IMPRESSION    No diagnosis found.      DISPOSITION/PLAN   DISPOSITION                 PATIENT REFERRED TO:  No follow-up provider specified.    DISCHARGE MEDICATIONS:  New Prescriptions    No medications on file     Controlled Substances Monitoring:     RX Monitoring Periodic Controlled Substance Monitoring   12/01/2017   7:54 PM Obtaining appropriate analgesic effect  of treatment.       (Please note that portions of this note were completed with a voice recognition program.  Efforts were made to edit the dictations but occasionally words are mis-transcribed.)    Gee Arzella, APRN - CNP (electronically signed)         Arzella Gee, APRN - CNP  10/14/23 1019

## 2024-01-04 ENCOUNTER — Inpatient Hospital Stay
Admit: 2024-01-04 | Discharge: 2024-01-04 | Disposition: A | Discharge status: Home / Self Care | Payer: Medicaid (Managed Care) | Arrived: WI | Attending: Emergency Medicine

## 2024-01-04 DIAGNOSIS — K1370 Unspecified lesions of oral mucosa: Principal | ICD-10-CM

## 2024-01-04 MED ORDER — CHLORHEXIDINE GLUCONATE 0.12 % MT SOLN
0.12 | Freq: Two times a day (BID) | OROMUCOSAL | 0 refills | Status: DC
Start: 2024-01-04 — End: 2024-01-04

## 2024-01-04 MED ORDER — OXYCODONE-ACETAMINOPHEN 5-325 MG PO TABS
5-325 | ORAL_TABLET | Freq: Three times a day (TID) | ORAL | 0 refills | Status: DC | PRN
Start: 2024-01-04 — End: 2024-01-04

## 2024-01-04 MED ORDER — VALACYCLOVIR HCL 1 G PO TABS
1 | ORAL_TABLET | Freq: Three times a day (TID) | ORAL | 0 refills | Status: DC
Start: 2024-01-04 — End: 2024-01-04

## 2024-01-04 MED ORDER — VALACYCLOVIR HCL 1 G PO TABS
1 | ORAL_TABLET | Freq: Three times a day (TID) | ORAL | 0 refills | Status: AC
Start: 2024-01-04 — End: 2024-01-11

## 2024-01-04 MED ORDER — IBUPROFEN 800 MG PO TABS
800 | ORAL_TABLET | Freq: Three times a day (TID) | ORAL | 0 refills | Status: DC | PRN
Start: 2024-01-04 — End: 2024-01-04

## 2024-01-04 MED ORDER — IBUPROFEN 800 MG PO TABS
800 | ORAL_TABLET | Freq: Three times a day (TID) | ORAL | 0 refills | 13.00000 days | Status: DC | PRN
Start: 2024-01-04 — End: 2024-03-14

## 2024-01-04 MED ORDER — CHLORHEXIDINE GLUCONATE 0.12 % MT SOLN
0.12 | Freq: Two times a day (BID) | OROMUCOSAL | 0 refills | Status: AC
Start: 2024-01-04 — End: 2024-01-18

## 2024-01-04 MED ORDER — OXYCODONE-ACETAMINOPHEN 5-325 MG PO TABS
5-325 | ORAL_TABLET | Freq: Three times a day (TID) | ORAL | 0 refills | Status: AC | PRN
Start: 2024-01-04 — End: 2024-01-08

## 2024-01-04 NOTE — ED Notes (Signed)
 Pt ambulatory to ED RM 23 via triage from home. Pt c/o mouth sores and upper extremity weakness. Pt states mouth sores and pain started about a week ago. Pt states upper extremity tingling and weakness started this morning. Pt reports extremity tingling and weakness resolved after taking clonopin. Pt denies chest pain, SOB, and N/V.    Pt is A&Ox4, respirations even and unlabored, call light within reach

## 2024-01-04 NOTE — ED Provider Notes (Signed)
 [facility]     Emergency Department     Faculty Attestation  3:07 PM EDT      I performed a history and physical examination of the patient and discussed management with the resident. I have reviewed and agree with the resident's findings including all diagnostic interpretations, and treatment plans as written. Any areas of disagreement are noted on the chart. I was personally present for the key portions of any procedures. I have documented in the chart those procedures where I was not present during the key portions. I have reviewed the emergency nurses triage note. I agree with the chief complaint, past medical history, past surgical history, allergies, medications, social and family history as documented unless otherwise noted below. Documentation of the HPI, Physical Exam and Medical Decision Making performed by scribes is based on my personal performance of the HPI, PE and MDM. For Physician Assistant/ Nurse Practitioner cases/documentation I have personally evaluated this patient and have completed at least one if not all key elements of the E/M (history, physical exam, and MDM). Additional findings are as noted.    Primary Care Physician: Loreli Craze, APRN - CNP    Sore in her mouth that are painful, and keeping her awake, she has not had any trauma to the area, has tried tylenol , motrin , and oragel without any relief.  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  Concern for possible aphthous ulcers vs hsv, will treat with valtrex , periodex rinse, and pain control    Tawni FABIENE Lever, D.O, M.P.H  Attending Emergency Medicine Physician         Lever Tawni R, DO  01/04/24 1512

## 2024-01-04 NOTE — ED Provider Notes (Signed)
 [facility] EMERGENCY DEPARTMENT  Emergency Department Encounter  Emergency Medicine Resident     Pt Name:Carolyn Crane  MRN: [id number]  Birthdate 05/14/1975  Date of evaluation: 01/04/24  PCP:  Loreli Craze, APRN - CNP  Note Started: 1:30 PM EDT      CHIEF COMPLAINT       Chief Complaint   Patient presents with    Mouth Lesions       HISTORY OF PRESENT ILLNESS  (Location/Symptom, Timing/Onset, Context/Setting, Quality, Duration, Modifying Factors, Severity.)      Carolyn Crane is a 48 y.o. female presenting to ED for    48 year old female presenting with oral lesions.    Patient has no known significant past medical history provided. No recent follow-up with dentist. Surgical and social history not specified.    She presents with one week of worsening lesions to the buccal mucosa and tongue. She reports DIFFICULTY SLEEPING due to discomfort. She denies fever, chills, numbness, tingling, weakness, chest pain, lesions on hands or soles, or purulent discharge. She denies suspicious food ingestion or concern for STD. She is tolerating oral intake, eating and drinking appropriately. She has trialed Ora-Gel, Tylenol , and Motrin  with only mild improvement. She changed her toothbrush but has not seen a dentist in some time. Lesions were initially thought most consistent with aphthous ulcers. 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 Patient remained stable throughout ED course. Arrival was by walk-in.    Positive ROS: DIFFICULTY SLEEPING.     PAST MEDICAL / SURGICAL / SOCIAL / FAMILY HISTORY     Problem List: has Chest pain; Diabetes 1.5, managed as type 2 (HCC); Dyslipidemia; Paresthesia; Diabetes mellitus (HCC); Depression; Bipolar 1 disorder (HCC); and Fibromyalgia on their problem list.    Past Medical History:  has a past medical history of Bipolar 1 disorder (HCC), Bipolar 1 disorder (HCC), Depression, Diabetes mellitus (HCC),  Fibromyalgia, Hyperlipidemia, IBS (irritable bowel syndrome), and PCOS (polycystic ovarian syndrome).     Past Surgical History:  has a past surgical history that includes Appendectomy; Cholecystectomy; Tonsillectomy; Foot surgery (Left); and shoulder surgery (Left).     Past Social History:  reports that she has never smoked. She has never used smokeless tobacco. She reports that she does not currently use alcohol. She reports that she does not use drugs.    Allergies: is allergic to dilaudid [hydromorphone], prochlorperazine , reglan [metoclopramide], and zofran  [ondansetron  hcl].    Home Meds:     Prior to Admission medications   Medication Sig Start Date End Date Taking? Authorizing Provider   valACYclovir  (VALTREX ) 1 g tablet Take 1 tablet by mouth 3 times daily for 7 days 01/04/24 01/11/24 Yes Joliyah Lippens, Dorn DEL, DO   oxyCODONE -acetaminophen  (PERCOCET) 5-325 MG per tablet Take 0.5 tablets by mouth every 8 hours as needed for Pain for up to 6 doses. Intended supply: 3 days. Take lowest dose possible to manage pain Max Daily Amount: 1.5 tablets 01/04/24 01/08/24 Yes Waddell Iten, Dorn DEL, DO   ibuprofen  (ADVIL ;MOTRIN ) 800 MG tablet Take 1 tablet by mouth every 8 hours as needed for Pain 01/04/24  Yes Valetta Dorn DEL, DO   chlorhexidine  (PERIDEX ) 0.12 % solution Swish and spit 15 mLs 2 times daily for 14 days 01/04/24 01/18/24 Yes Ader Fritze H, DO   famotidine  (PEPCID ) 20 MG tablet Take 1 tablet by mouth 2 times daily    [provider]   dicyclomine  (BENTYL ) 10  MG capsule Take 1 capsule by mouth every 6 hours as needed (Bowel spasms) 10/12/23 10/17/23  Arzella Gee, APRN - CNP   doxycycline  hyclate (VIBRA -TABS) 100 MG tablet Take 1 tablet by mouth 2 times daily 05/25/20   Katko, Jeffery, MD   metroNIDAZOLE  (FLAGYL ) 500 MG tablet Take 1 tablet by mouth 2 times daily 05/25/20   Katko, Jeffery, MD   azithromycin  (ZITHROMAX ) 250 MG tablet Take 1 tablet by mouth daily 05/07/20   Levern Ranell SAILOR, MD   Insulin   Detemir (LEVEMIR SC) Inject 110 Units into the skin daily    [provider]   acetaminophen  (TYLENOL ) 500 MG tablet Take 1 tablet by mouth every 6 hours as needed for Pain 02/14/20 02/28/20  Meridieth, Debby PARAS, APRN - CNP   lansoprazole  (PREVACID ) 30 MG delayed release capsule Take 1 capsule by mouth daily 12/10/19   Syverson, Matthew, DO   sucralfate  (CARAFATE ) 1 GM tablet Take 1 tablet by mouth 4 times daily 12/10/19   Syverson, Matthew, DO   furosemide  (LASIX ) 20 MG tablet Take 1 tablet by mouth daily 10/30/19   Lonita Hough, APRN - CNP   aspirin  81 MG EC tablet Take 1 tablet by mouth daily 09/19/19   Rhys Toribio BROCKS, APRN - NP   SUMAtriptan  (IMITREX ) 50 MG tablet Take 1 tablet by mouth daily as needed for Migraine 09/18/19   Rhys Toribio BROCKS, APRN - NP   glimepiride (AMARYL) 1 MG tablet Take 1 mg by mouth every morning (before breakfast)    [provider]   LORazepam  (ATIVAN ) 0.5 MG tablet Take 1 tablet by mouth 2 times daily as needed for Anxiety.    [provider]   escitalopram  (LEXAPRO ) 10 MG tablet Take 10 mg by mouth daily     [provider]   Liraglutide  (VICTOZA  SC) Inject 1.8 Units into the skin daily     [provider]   atorvastatin  (LIPITOR ) 80 MG tablet Take 80 mg by mouth daily    [provider]   fenofibrate  160 MG tablet Take 160 mg by mouth daily     [provider]   Insulin  Degludec (TRESIBA  FLEXTOUCH) 200 UNIT/ML SOPN Inject 100 Units into the skin every morning     [provider]   ARIPiprazole  (ABILIFY ) 15 MG tablet Take 30 mg by mouth daily     [provider]       REVIEW OF SYSTEMS       REVIEW OF SYSTEMS:    Constitutional: Negative for fever, chills  Eyes: Negative for visual changes  ENT: POSITIVE FOR ORAL LESIONS AND DIFFICULTY SLEEPING RELATED TO PAIN  Cardiovascular: Negative for lightheadedness, chest pain, palpitations, peripheral swelling  Respiratory: Negative for shortness of breath, cough,  wheezing  Gastrointestinal: Negative for nausea, vomiting, diarrhea, constipation, abdominal pain  Genitourinary: Negative for dysuria, urgency, frequency, hematuria  Musculoskeletal: Negative for neck, back, or other musculoskeletal pain  Skin: Negative for rash, no hand/sole lesions  Neurological: Negative for headache, weakness, numbness, dizziness, or syncope     VITALS AND PHYSICAL EXAM      Temp 97.8 F (36.6 C) (01/04/24 1320)    HR 83 (01/04/24 1320)    BP 132/82 (01/04/24 1320)   RR 16 (01/04/24 1320)    SPO2 100 % (01/04/24 1320)      *Last Charted O2 Device and settings if pertinent  Constitutional: No acute distress, well-nourished, well-hydrated  HENT: NC/AT. Oral mucosa with scattered painful ulcerative lesions on buccal mucosa and tongue. Moist mucous membranes. No pharyngeal erythema or exudate.  Reference images below  Eyes: PERRLA, sclera clear, anicteric  Neck: Supple, no JVD or lymphadenopathy  Respiratory: CTAB, no wheezes, rales, rhonchi  Cardiovascular: Regular rate/rhythm, no murmurs, peripheral pulses 2+  Abdomen: Soft, non-tender, non-distended  Extremities: No edema or deformity  Skin: Warm, dry, no rash, no lesions noted on palms or soles  Neurological: A&O x3, GCS 15, CN II-XII intact, no focal deficits  Musculoskeletal: Full ROM in extremities, non-tender                      DDX/DIAGNOSTIC RESULTS / EMERGENCY DEPARTMENT COURSE / MDM     MDM:    Case Summary:  48 year old female with oral mucosal lesions for one week, afebrile, stable vital signs, tolerating oral intake. Exam notable for painful oral lesions without systemic involvement. History of positive HSV titer, making HSV stomatitis a significant consideration.    Differential Diagnosis:  HSV gingivostomatitis - likely given history of positive HSV titer and oral lesion appearance  Aphthous stomatitis - possible, but HSV history increases suspicion otherwise  Oral candidiasis - less likely, no  plaques  Hand-foot-mouth disease - unlikely, no extremity lesions  Oral trauma/irritant reaction - possible, though no clear trigger    Plan:  Start Valacyclovir  (Valtrex ) for suspected HSV infection  Supportive therapy with topical anesthetic and pain control  Start chlorhexidine  rinse for oral hygiene and to limit secondary infection  Given significant pain and difficulty sleeping, will discharge with a short course opioid medication for symptomatic management with risks and benefits as well as appropriate precautions explained to patient.  Discharge with strict return precautions    ED Course:  Patient remained stable, no airway involvement, tolerating oral intake. Treated symptomatically in ED. Will start Valtrex  outpatient and continue supportive care.     "Additional clinical updates may be documented in the ED course when pertinent or available, consistent with standard ED workflow."    Decision-Making Capacity & Shared Decision-Making:  Patient has capacity. Shared decision-making performed regarding empiric HSV treatment. Patient agreed with plan.    Disposition:  Discharged in stable condition with prescription for Valtrex  and instructions for supportive care.    Notable Data Elements / Tasks:  Independent oral exam performed, decision to initiate empiric antiviral therapy made.      EKG    All EKG's are interpreted by the Emergency Department Physician who either signs or Co-signs this chart in the absence of a cardiologist.    EMERGENCY DEPARTMENT COURSE:    ED Course as of 01/04/24 1729   Tue Jan 04, 2024   1524 Patient made aware of risks benefits and alternatives to sedating medication given that she is currently already on Klonopin.  Patient verbalizes risk benefits alternatives.  Patient amenable to proceeding cautiously. [JD]      ED Course User Index  [JD] Valetta Dorn DEL, DO                                 PROCEDURES:      CONSULTS:    None    CRITICAL CARE:    There was significant risk of  life threatening deterioration of patient's condition requiring my direct management. Critical care time 0 minutes, excluding any documented procedures.  FINAL IMPRESSION      1. Mouth lesion          DISPOSITION / PLAN     DISPOSITION Decision To Discharge 01/04/2024 03:03:10 PM   DISPOSITION CONDITION Stable           PATIENT REFERRED TO:  [facility] Emergency Department  [address]  [place]  [address]  [phone]    Call to make appt for follow-up    Loreli Craze, APRN - CNP  [address]  [place] [address]  [phone]      Call to make appt for follow-up      DISCHARGE MEDICATIONS:  Discharge Medication List as of 01/04/2024  3:03 PM        START taking these medications    Details   chlorhexidine  (PERIDEX ) 0.12 % solution Swish and spit 15 mLs 2 times daily for 14 days, Disp-420 mL, R-0Normal      valACYclovir  (VALTREX ) 1 g tablet Take 1 tablet by mouth 3 times daily for 7 days, Disp-21 tablet, R-0Normal      oxyCODONE -acetaminophen  (PERCOCET) 5-325 MG per tablet Take 1 tablet by mouth every 8 hours as needed for Pain for up to 3 doses. Intended supply: 3 days. Take lowest dose possible to manage pain Max Daily Amount: 3 tablets, Disp-3 tablet, R-0Normal             Dorn VEAR Davidson, DO  Emergency Medicine Resident    (Please note that portions of thisnote were completed with a voice recognition program.  Efforts were made to edit the dictations but occasionally words are mis-transcribed.)

## 2024-01-04 NOTE — Discharge Instructions (Addendum)
 You have been evaluated in the Emergency Department at [facility] 01/04/2024.    Instructions & Next Steps:  -Findings: Concern for HSV  -Medications: Valtrex , Percocet, Peridex - Take as directed. Continue to take your other home meds/previously prescribed medications unless directed otherwise.  -Follow-up:  Schedule an appointment with your primary care provider (PCP) or establish care with a provider if you do not have one.    When to Seek Immediate Medical Attention:  Return to the Emergency Department if you experience:    -A wound that is red, swollen, or has pus that is not improving with treatment over a 48hr period  -Spreading redness beyond the affected area  -Fever with a skin infection or an area of blackened skin  -New or rapidly growing moles, rashes, or unexplained skin changes  -Rapidly spreading wound  -Intense itching to skin around wound area associated with skin changes  -Worsening wound in the groin/genital area    -Worsening or changing symptoms  -Inability to follow up with your PCP  -Any other urgent health concerns  -When in Doubt: If you feel worse or have new concerning symptoms, return to the ER immediately.    For questions about your care or further treatment, call the [facility] Emergency Department at [phone].    Additional References:    (For Pain, Muscle Relaxers, or Sleep Meds)    Don't Drive or Use Machines  -These meds can make you sleepy or slow. Don't drive, use power tools, or do anything that needs you to be alert.    No Alcohol, Weed, or other substances  -Do not drink alcohol, use marijuana, or take other sleepy meds. Mixing them can make you stop breathing.    Watch Your Breathing  Go to the ER or call 911 if you:  -Can't stay awake  -Have trouble breathing  -Turn blue around your lips or nails    Use Only as Told  -Take the medicine exactly how the doctor said. Don't take extra or share with others.    Store Safely  -Keep meds in a safe  place, away from kids or others who shouldn't have them.    Don't Stop Suddenly  -If you're on these meds for more than a few days, talk to a doctor before stopping. Stopping too fast can make you feel sick.    Be Extra Careful if You're Older or Sick  -If you're over 65 or have breathing or health problems, the meds can affect you more. Watch out for dizziness or falls.    Know When to Call  Call your doctor or return to the ER if you feel:  -Very dizzy or confused  -Sick to your stomach and can't keep meds down  -Pain that doesn't get better

## 2024-03-14 ENCOUNTER — Inpatient Hospital Stay
Admit: 2024-03-14 | Discharge: 2024-03-14 | Disposition: A | Discharge status: Home / Self Care | Payer: Medicaid (Managed Care) | Arrived: WI | Attending: Emergency Medicine

## 2024-03-14 DIAGNOSIS — G43909 Migraine, unspecified, not intractable, without status migrainosus: Secondary | ICD-10-CM

## 2024-03-14 MED ORDER — CYCLOBENZAPRINE HCL 10 MG PO TABS
10 | Freq: Once | ORAL | Status: AC
Start: 2024-03-14 — End: 2024-03-14
  Administered 2024-03-14: 11:00:00 10 mg via ORAL

## 2024-03-14 MED ORDER — CYCLOBENZAPRINE HCL 10 MG PO TABS
10 | ORAL_TABLET | Freq: Three times a day (TID) | ORAL | 0 refills | 10.00000 days | Status: AC | PRN
Start: 2024-03-14 — End: 2024-03-24

## 2024-03-14 MED ORDER — ACETAMINOPHEN 500 MG PO TABS
500 | ORAL_TABLET | Freq: Four times a day (QID) | ORAL | 0 refills | 12.00000 days | Status: AC | PRN
Start: 2024-03-14 — End: 2024-03-28

## 2024-03-14 MED ORDER — KETOROLAC TROMETHAMINE 30 MG/ML IJ SOLN
30 | Freq: Once | INTRAMUSCULAR | Status: AC
Start: 2024-03-14 — End: 2024-03-14
  Administered 2024-03-14: 11:00:00 30 mg via INTRAMUSCULAR

## 2024-03-14 MED ORDER — SCOPOLAMINE 1 MG/3DAYS TD PT72
1 | MEDICATED_PATCH | TRANSDERMAL | 0 refills | Status: AC
Start: 2024-03-14 — End: ?

## 2024-03-14 MED ORDER — SCOPOLAMINE 1 MG/3DAYS TD PT72
1 | TRANSDERMAL | Status: DC
Start: 2024-03-14 — End: 2024-03-14
  Administered 2024-03-14: 11:00:00 1 via TRANSDERMAL

## 2024-03-14 MED ORDER — IBUPROFEN 800 MG PO TABS
800 | ORAL_TABLET | Freq: Three times a day (TID) | ORAL | 0 refills | 15.00000 days | Status: DC | PRN
Start: 2024-03-14 — End: 2024-04-24

## 2024-03-14 MED FILL — KETOROLAC TROMETHAMINE 30 MG/ML IJ SOLN: 30 mg/mL | INTRAMUSCULAR | Qty: 1 | Fill #0

## 2024-03-14 MED FILL — CYCLOBENZAPRINE HCL 10 MG PO TABS: 10 mg | ORAL | Qty: 1 | Fill #0

## 2024-03-14 MED FILL — SCOPOLAMINE 1 MG/3DAYS TD PT72: 1 MG/3DAYS | TRANSDERMAL | Qty: 1 | Fill #0

## 2024-03-14 NOTE — ED Notes (Signed)
"  Mode of arrival (squad #, walk in, police, etc) : walkin         Chief complaint(s): headache, nausea, neck pain         Arrival Note (brief scenario, treatment PTA, etc).: pt reports a migraine headache since 10pm last night that has caused her to not sleep. Pt did take 650 mg tylenol  at 4am with no relief. Pt reports she has not had a migraine in 6 years. The pain does start in her neck and up her head. Pt is photosensitive. No distress noted. GCS 15        C= Have you ever felt that you should Cut down on your drinking?  No  A= Have people Annoyed you by criticizing your drinking?  No  G= Have you ever felt bad or Guilty about your drinking?  No  E= Have you ever had a drink as an Eye-opener first thing in the morning to steady your nerves or to help a hangover?  No      Deferred []       Reason for deferring: N/A    *If yes to two or more: probable alcohol abuse.*    "

## 2024-03-14 NOTE — ED Provider Notes (Signed)
" [place] [facility] EMERGENCY DEPARTMENT  EMERGENCY DEPARTMENT ENCOUNTER      Pt Name: Carolyn Crane  MRN: [id number]  Birthdate [birth year]  Date of evaluation: 03/14/24      CHIEF COMPLAINT:   Chief Complaint   Patient presents with    Headache    Nausea     HISTORY OF PRESENT ILLNESS    Carolyn Crane is a 48 y.o. female who presents with headache that started earlier in the evening.  She has not been able to sleep throughout the night.. The headache is located in the right frontal area.   It has been gradually worsening and was not thunder clap in origin at all. There has been no syncope associated either.  It is typical in character to previous headaches.    REVIEW OF SYSTEMS     Constitutional: Denies recent fever, chills.  Eyes: No visual changes.    Neck: No neck pain.   Respiratory: Denies recent shortness of breath.    Cardiac:  Denies recent chest pain.    GI: denies any recent abdominal pain, + nausea, no vomiting.    GU: denies dysuria.    Musculoskeletal: Denies focal weakness.    Neurologic: c/o headache  Skin:  Denies any rash.      Negative in 10 essential Systems except as mentioned above and in the HPI.      PAST MEDICAL HISTORY   PMH:  has a past medical history of Bipolar 1 disorder (HCC), Bipolar 1 disorder (HCC), Depression, Diabetes mellitus (HCC), Fibromyalgia, Hyperlipidemia, IBS (irritable bowel syndrome), and PCOS (polycystic ovarian syndrome). none otherwise stated from nurses notes  Surgical History:  has a past surgical history that includes Appendectomy; Cholecystectomy; Tonsillectomy; Foot surgery (Left); and shoulder surgery (Left). none otherwise stated from nurses notes  Social History:  reports that she has never smoked. She has never used smokeless tobacco. She reports that she does not currently use alcohol. She reports that she does not use drugs. , lives at home with others  Allergies:is allergic to dilaudid [hydromorphone], prochlorperazine , reglan [metoclopramide], and zofran   [ondansetron  hcl].    PHYSICAL EXAM     INITIAL VITALS: BP 118/80   Pulse 84   Temp 97.1 F (36.2 C) (Oral)   Resp 16   Ht 1.651 m (5' 5)   Wt 86.2 kg (190 lb)   LMP 02/23/2024 (Exact Date)   SpO2 98%   BMI 31.62 kg/m   Constitutional:  Well developed, A&O times 3  Eyes:  Pupils equal and readily reactive to light at 3 mm bilaterally  HENT:  Atraumatic, external ears normal, nose normal.    Neck:  Supple with absolutely no meningismus.  Respiratory:  Clear to auscultation bilaterally with good air exchange  Cardiovascular:  RRR with normal S1 and S2  GI:  Soft, nondistended/normal BS, and nontender   Musculoskeletal:  No edema, no tenderness, no deformities.   Back:  No CVA tenderness.  Normal to inspection.    Integument:  No rash.  Palms are clear bilaterally  Neuro examination:  Patient is alert and oriented 3.  Extraocular movements intact.  Pupils are equal and reactive to light bilaterally.  Speech is normal.  No word finding difficulties noted.  Eyebrows raise symmetrically.  Patient is able to puff out cheeks symmetrically with no issues.  Tongue protrudes to midline.  Shoulder shrug is symmetrical.  Right upper extremity: 5/5 strength with finger abduction, flexion and extension of elbow. Left upper extremity:  5/5 strength with finger abduction, flexion and extension of elbow.  Sensation intact to upper extremities.  Patient able to lift both legs off the bed and hold for 5 seconds without issues.  5/5 strength with big toe dorsiflexion bilaterally.  Sensation to light touch intact to bilateral lower extremities.     EMERGENCY DEPARTMENT COURSE:     Orders Placed This Encounter   Medications    DISCONTD: scopolamine  (TRANSDERM-SCOP) transdermal patch 1 patch    cyclobenzaprine  (FLEXERIL ) tablet 10 mg    ketorolac  (TORADOL ) injection 30 mg    scopolamine  (TRANSDERM-SCOP) transdermal patch     Sig: Place 1 patch onto the skin every 72 hours     Dispense:  3 patch     Refill:  0    cyclobenzaprine   (FLEXERIL ) 10 MG tablet     Sig: Take 1 tablet by mouth 3 times daily as needed for Muscle spasms     Dispense:  10 tablet     Refill:  0    acetaminophen  (TYLENOL ) 500 MG tablet     Sig: Take 1 tablet by mouth every 6 hours as needed for Pain     Dispense:  60 tablet     Refill:  0    ibuprofen  (ADVIL ;MOTRIN ) 800 MG tablet     Sig: Take 1 tablet by mouth every 8 hours as needed for Pain     Dispense:  30 tablet     Refill:  0         The patient presents with headache without signs of CNS bleed, stroke, infection, temporal arteritis, idiopathic intracranial hypertension, or other serious etiology.  The patient is neurologically intact.  Given the extremely low risk of these diagnoses further testing and evaluation for these possibilities does not appear to be indicated at this time.  The patient was treated with flexeril , scopolamine  for nausea, and toradol .   After treatment,  the patient reported feeling better and wanted to go home.  D/w pt treatment plan, warning precautions for prompt ED return and importance of close OP FU, she verbalizes understanding and agrees with the treatment plan.       FINAL IMPRESSION:     1. Migraine without status migrainosus, not intractable, unspecified migraine type          DISPOSITION:  DISPOSITION Decision To Discharge 03/14/2024 06:16:52 AM   DISPOSITION CONDITION Stable             PATIENT REFERRED TO:  Loreli Craze, APRN - CNP  [address]  [place] [address]  [phone]          [facility] Emergency Department  [address]  [place]  [place]  [phone]  [phone]    If symptoms worsen      DISCHARGE MEDICATIONS:  Discharge Medication List as of 03/14/2024  6:19 AM        START taking these medications    Details   scopolamine  (TRANSDERM-SCOP) transdermal patch Place 1 patch onto the skin every 72 hours, Disp-3 patch, R-0Normal      cyclobenzaprine  (FLEXERIL ) 10 MG tablet Take 1 tablet by mouth 3 times daily as needed for Muscle spasms, Disp-10 tablet,  R-0Normal             (Please note that portions of this note were completed with a voice recognition program.  Efforts were made to edit the dictations but occasionally words are mis-transcribed.)       Maurice, Franky PARAS, MD  03/14/24 1835    "

## 2024-04-24 ENCOUNTER — Inpatient Hospital Stay
Admit: 2024-04-24 | Discharge: 2024-04-24 | Disposition: A | Discharge status: Home / Self Care | Payer: Medicaid (Managed Care) | Arrived: WI | Attending: Emergency Medicine

## 2024-04-24 MED ORDER — PENICILLIN V POTASSIUM 500 MG PO TABS
500 | ORAL_TABLET | Freq: Four times a day (QID) | ORAL | 0 refills | 7.00000 days | Status: AC
Start: 2024-04-24 — End: 2024-05-01

## 2024-04-24 MED ORDER — TRAMADOL-ACETAMINOPHEN 37.5-325 MG PO TABS
37.5-325 | ORAL_TABLET | Freq: Three times a day (TID) | ORAL | 0 refills | 7.00000 days | Status: AC | PRN
Start: 2024-04-24 — End: 2024-04-27

## 2024-04-24 MED ORDER — IBUPROFEN 600 MG PO TABS
600 | ORAL_TABLET | Freq: Three times a day (TID) | ORAL | 0 refills | 15.00000 days | Status: AC | PRN
Start: 2024-04-24 — End: ?

## 2024-04-24 NOTE — ED Provider Notes (Signed)
 " [place] [facility] EMERGENCY DEPARTMENT  eMERGENCY dEPARTMENT eNCOUnter      Pt Name: Carolyn Crane  MRN: [id number]  Birthdate December 02, 1975  Date of evaluation: 04/24/24      CHIEF COMPLAINT       Chief Complaint   Patient presents with    Dental Pain         HISTORY OF PRESENT ILLNESS    Carolyn Crane is a 48 y.o. female who presents complaining of dental pain due to dental caries  The history is provided by the patient.   Dental Pain  Location:  Lower  Lower teeth location:  19/LL 1st molar  Quality:  Aching  Severity:  Moderate  Onset quality:  Sudden  Duration:  2 days  Timing:  Intermittent  Progression:  Waxing and waning  Chronicity:  New  Context: dental caries    Relieved by:  Nothing  Worsened by:  Nothing  Ineffective treatments:  None tried  Associated symptoms: no drooling, no facial swelling, no neck swelling, no oral bleeding and no trismus    Risk factors: lack of dental care        REVIEW OF SYSTEMS       Review of Systems   HENT:  Positive for dental problem. Negative for drooling and facial swelling.    All other systems reviewed and are negative.      PAST MEDICAL HISTORY     Past Medical History:   Diagnosis Date    Bipolar 1 disorder (HCC)     Bipolar 1 disorder (HCC)     Depression     Diabetes mellitus (HCC)     Fibromyalgia     Hyperlipidemia     IBS (irritable bowel syndrome)     PCOS (polycystic ovarian syndrome)        SURGICAL HISTORY       Past Surgical History:   Procedure Laterality Date    APPENDECTOMY      CHOLECYSTECTOMY      FOOT SURGERY Left     plantar fascitiis release    SHOULDER SURGERY Left     TONSILLECTOMY         CURRENT MEDICATIONS       Discharge Medication List as of 04/24/2024  3:35 PM        CONTINUE these medications which have NOT CHANGED    Details   rosuvastatin (CRESTOR) 20 MG tablet Take 1 tablet by mouth dailyHistorical Med      Semaglutide, 1 MG/DOSE, (OZEMPIC, 1 MG/DOSE,) 4 MG/3ML SOPN sc injection Inject 1 mg into the skin every 7 daysHistorical Med       lamoTRIgine (LAMICTAL) 150 MG tablet Take 1 tablet by mouth dailyHistorical Med      clonazePAM (KLONOPIN) 1 MG tablet Take 1 tablet by mouth as needed for Anxiety.Historical Med      scopolamine  (TRANSDERM-SCOP) transdermal patch Place 1 patch onto the skin every 72 hours, Disp-3 patch, R-0Normal      acetaminophen  (TYLENOL ) 500 MG tablet Take 1 tablet by mouth every 6 hours as needed for Pain, Disp-60 tablet, R-0Normal      famotidine  (PEPCID ) 20 MG tablet Take 1 tablet by mouth 2 times dailyHistorical Med      dicyclomine  (BENTYL ) 10 MG capsule Take 1 capsule by mouth every 6 hours as needed (Bowel spasms), Disp-20 capsule, R-0Normal      doxycycline  hyclate (VIBRA -TABS) 100 MG tablet Take 1 tablet by mouth 2 times daily, Disp-20 tablet, R-0Normal  metroNIDAZOLE  (FLAGYL ) 500 MG tablet Take 1 tablet by mouth 2 times daily, Disp-20 tablet, R-0Normal      azithromycin  (ZITHROMAX ) 250 MG tablet Take 1 tablet by mouth daily, Disp-4 tablet, R-0Print      Insulin  Detemir (LEVEMIR SC) Inject 110 Units into the skin dailyHistorical Med      lansoprazole  (PREVACID ) 30 MG delayed release capsule Take 1 capsule by mouth daily, Disp-30 capsule, R-0Print      sucralfate  (CARAFATE ) 1 GM tablet Take 1 tablet by mouth 4 times daily, Disp-120 tablet, R-0Print      furosemide  (LASIX ) 20 MG tablet Take 1 tablet by mouth daily, Disp-5 tablet, R-0Print      aspirin  81 MG EC tablet Take 1 tablet by mouth daily, Disp-30 tablet, R-3Normal      SUMAtriptan  (IMITREX ) 50 MG tablet Take 1 tablet by mouth daily as needed for Migraine, Disp-18 tablet, R-1Normal      glimepiride (AMARYL) 1 MG tablet Take 1 mg by mouth every morning (before breakfast)Historical Med      LORazepam  (ATIVAN ) 0.5 MG tablet Take 1 tablet by mouth 2 times daily as needed for Anxiety.Historical Med      escitalopram  (LEXAPRO ) 10 MG tablet Take 1 tablet by mouth dailyHistorical Med      Liraglutide  (VICTOZA  SC) Inject 1.8 Units into the skin daily Historical Med       atorvastatin  (LIPITOR ) 80 MG tablet Take 80 mg by mouth dailyHistorical Med      fenofibrate  160 MG tablet Take 48 mg by mouth dailyHistorical Med      Insulin  Degludec (TRESIBA  FLEXTOUCH) 200 UNIT/ML SOPN Inject 100 Units into the skin every morningHistorical Med      ARIPiprazole  (ABILIFY ) 15 MG tablet Take 5 mg by mouth dailyHistorical Med             ALLERGIES     is allergic to dilaudid [hydromorphone], prochlorperazine , reglan [metoclopramide], and zofran  [ondansetron  hcl].    FAMILY HISTORY     She indicated that the status of her mother is unknown. She indicated that the status of her father is unknown. She indicated that the status of her maternal grandmother is unknown. She indicated that the status of her maternal grandfather is unknown.       SOCIAL HISTORY      reports that she has never smoked. She has never used smokeless tobacco. She reports that she does not currently use alcohol. She reports that she does not use drugs.    PHYSICAL EXAM     INITIAL VITALS: BP 111/67   Pulse 71   Temp 98.2 F (36.8 C) (Oral)   Resp 16   Ht 1.651 m (5' 5)   Wt 85.7 kg (189 lb)   LMP 04/21/2024 (Exact Date)   SpO2 98%   BMI 31.45 kg/m      Physical Exam  Vitals and nursing note reviewed.   Constitutional:       Appearance: She is well-developed.   HENT:      Head: Normocephalic and atraumatic.      Mouth/Throat:        Comments: Numerous dental caries no facial swelling no trismus no abscess visualized  Cardiovascular:      Rate and Rhythm: Normal rate and regular rhythm.      Heart sounds: Normal heart sounds.   Pulmonary:      Effort: Pulmonary effort is normal.      Breath sounds: Normal breath sounds.   Musculoskeletal:  General: Normal range of motion.   Skin:     General: Skin is warm and dry.      Capillary Refill: Capillary refill takes less than 2 seconds.      Findings: No rash.   Neurological:      Mental Status: She is alert and oriented to person, place, and time.         MEDICAL  DECISION MAKING:   She is alert and oriented she is in no apparent distress.  No trismus no neck swelling.  Speaking full sentences.  Numerous dental caries.  Will start on oral antibiotics analgesics.  Recommend warm salt water gargles.  She is instructed to follow-up with the dentist for definitive care of her teeth.  She verbalized understanding discharge instructions agreeable to plan.    DIAGNOSTIC RESULTS     EKG: All EKG's are interpreted by the Emergency Department Physician who either signs or Co-signs this chart in the absence of acardiologist.        RADIOLOGY:Allplain film, CT, MRI, and formal ultrasound images (except ED bedside ultrasound) are read by the radiologist and the images and interpretations are directly viewed by the emergency physician.           LABS:All lab results were reviewed by myself, and all abnormals are listed below.  Labs Reviewed - No data to display      EMERGENCY DEPARTMENT COURSE:   Vitals:    Vitals:    04/24/24 1339   BP: 111/67   Pulse: 71   Resp: 16   Temp: 98.2 F (36.8 C)   TempSrc: Oral   SpO2: 98%   Weight: 85.7 kg (189 lb)   Height: 1.651 m (5' 5)       The patient was given the following medications while in the emergency department:  Orders Placed This Encounter   Medications    penicillin  v potassium (VEETID) 500 MG tablet     Sig: Take 1 tablet by mouth 4 times daily for 7 days     Dispense:  28 tablet     Refill:  0    ibuprofen  (ADVIL ;MOTRIN ) 600 MG tablet     Sig: Take 1 tablet by mouth 3 times daily as needed for Pain     Dispense:  30 tablet     Refill:  0    traMADol-acetaminophen  (ULTRACET) 37.5-325 MG per tablet     Sig: Take 1 tablet by mouth every 8 hours as needed for Pain for up to 3 days. Max Daily Amount: 3 tablets     Dispense:  12 tablet     Refill:  0       -------------------------      CRITICAL CARE:     CONSULTS:  None    PROCEDURES:  Procedures    FINAL IMPRESSION      1. Pain due to dental caries          DISPOSITION/PLAN   DISPOSITION  Decision To Discharge 04/24/2024 03:31:32 PM   DISPOSITION CONDITION Stable           PATIENT REFERREDTO:  [facility]   [address] [place]  [address]  [phone]  Schedule an appointment as soon as possible for a visit in 2 days      [facility] Emergency Department  [address]  [place]  [place]  [phone]  [phone]    If symptoms worsen      DISCHARGEMEDICATIONS:  Discharge Medication List as  of 04/24/2024  3:35 PM        START taking these medications    Details   penicillin  v potassium (VEETID) 500 MG tablet Take 1 tablet by mouth 4 times daily for 7 days, Disp-28 tablet, R-0Normal      ibuprofen  (ADVIL ;MOTRIN ) 600 MG tablet Take 1 tablet by mouth 3 times daily as needed for Pain, Disp-30 tablet, R-0Normal      traMADol-acetaminophen  (ULTRACET) 37.5-325 MG per tablet Take 1 tablet by mouth every 8 hours as needed for Pain for up to 3 days. Max Daily Amount: 3 tablets, Disp-12 tablet, R-0Normal             (Please note that portions of this note were completed with a voice recognition program.  Efforts were made to edit thedictations but occasionally words are mis-transcribed.)    Elspeth JONETTA Hymen, PA-C                       Lasheika Ortloff D, PA-C  04/24/24 2048    "

## 2024-04-24 NOTE — Discharge Instructions (Addendum)
"  Call [facility] center at [phone]  Warm salt water gargles may be beneficial  Good oral hygiene  Follow-up with dentist for definitive care of your teeth  "

## 2024-04-24 NOTE — ED Provider Notes (Signed)
"  eMERGENCY dEPARTMENT  Attending Physician Attestation     Pt Name: YARELIZ THORSTENSON  MRN: [id number]  Birthdate 1976/04/08  Date of evaluation: 04/24/24     AALA RANSOM is a 48 y.o. female with CC: Dental Pain      I was available to render services if needed but did not directly participate in the care of the patient.    Vitals Reviewed:    Vitals:    04/24/24 1339   BP: 111/67   Pulse: 71   Resp: 16   Temp: 98.2 F (36.8 C)   TempSrc: Oral   SpO2: 98%   Weight: 85.7 kg (189 lb)   Height: 1.651 m (5' 5)       Initial Pain Score: Pain Level: 10 (04/24/24 1339)  Last Pain Score: Pain Level: 10 (04/24/24 1339)    Rankin JONELLE Odea, DO  Attending Emergency Physician                 Odea Rankin JONELLE, DO  04/24/24 2102    "
